# Patient Record
Sex: Female | Born: 1985 | Race: Black or African American | Hispanic: No | Marital: Single | Smoking: Former smoker
Health system: Southern US, Community
[De-identification: ages and names within clinical notes are randomized; demographics above are authoritative.]

## PROBLEM LIST (undated history)

## (undated) DIAGNOSIS — F329 Major depressive disorder, single episode, unspecified: Secondary | ICD-10-CM

## (undated) DIAGNOSIS — M549 Dorsalgia, unspecified: Secondary | ICD-10-CM

## (undated) DIAGNOSIS — I10 Essential (primary) hypertension: Secondary | ICD-10-CM

## (undated) DIAGNOSIS — G8929 Other chronic pain: Secondary | ICD-10-CM

## (undated) DIAGNOSIS — F32A Depression, unspecified: Secondary | ICD-10-CM

## (undated) DIAGNOSIS — E119 Type 2 diabetes mellitus without complications: Secondary | ICD-10-CM

## (undated) DIAGNOSIS — O24419 Gestational diabetes mellitus in pregnancy, unspecified control: Secondary | ICD-10-CM

## (undated) DIAGNOSIS — R609 Edema, unspecified: Secondary | ICD-10-CM

## (undated) DIAGNOSIS — F419 Anxiety disorder, unspecified: Secondary | ICD-10-CM

## (undated) DIAGNOSIS — G35 Multiple sclerosis: Secondary | ICD-10-CM

## (undated) HISTORY — PX: APPENDECTOMY: SHX54

## (undated) HISTORY — DX: Gestational diabetes mellitus in pregnancy, unspecified control: O24.419

## (undated) HISTORY — DX: Multiple sclerosis: G35

## (undated) HISTORY — PX: WISDOM TOOTH EXTRACTION: SHX21

---
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
## 2007-02-21 ENCOUNTER — Emergency Department (HOSPITAL_COMMUNITY): Admission: EM | Admit: 2007-02-21 | Discharge: 2007-02-21 | Payer: Self-pay | Admitting: Emergency Medicine

## 2007-02-28 ENCOUNTER — Emergency Department (HOSPITAL_COMMUNITY): Admission: EM | Admit: 2007-02-28 | Discharge: 2007-02-28 | Payer: Self-pay | Admitting: Emergency Medicine

## 2007-07-14 ENCOUNTER — Emergency Department (HOSPITAL_COMMUNITY): Admission: EM | Admit: 2007-07-14 | Discharge: 2007-07-15 | Payer: Self-pay | Admitting: Emergency Medicine

## 2007-07-15 ENCOUNTER — Inpatient Hospital Stay (HOSPITAL_COMMUNITY): Admission: AD | Admit: 2007-07-15 | Discharge: 2007-07-15 | Payer: Self-pay | Admitting: Obstetrics & Gynecology

## 2007-07-15 ENCOUNTER — Emergency Department (HOSPITAL_COMMUNITY): Admission: EM | Admit: 2007-07-15 | Discharge: 2007-07-15 | Payer: Self-pay | Admitting: Emergency Medicine

## 2007-07-16 ENCOUNTER — Inpatient Hospital Stay (HOSPITAL_COMMUNITY): Admission: AD | Admit: 2007-07-16 | Discharge: 2007-07-16 | Payer: Self-pay | Admitting: Obstetrics & Gynecology

## 2007-10-05 ENCOUNTER — Emergency Department (HOSPITAL_COMMUNITY): Admission: EM | Admit: 2007-10-05 | Discharge: 2007-10-05 | Payer: Self-pay | Admitting: Emergency Medicine

## 2008-02-11 ENCOUNTER — Emergency Department (HOSPITAL_COMMUNITY): Admission: EM | Admit: 2008-02-11 | Discharge: 2008-02-11 | Payer: Self-pay | Admitting: Emergency Medicine

## 2008-06-26 ENCOUNTER — Emergency Department (HOSPITAL_COMMUNITY): Admission: EM | Admit: 2008-06-26 | Discharge: 2008-06-26 | Payer: Self-pay | Admitting: Emergency Medicine

## 2008-08-20 ENCOUNTER — Emergency Department (HOSPITAL_COMMUNITY): Admission: EM | Admit: 2008-08-20 | Discharge: 2008-08-20 | Payer: Self-pay | Admitting: Emergency Medicine

## 2008-08-30 ENCOUNTER — Emergency Department (HOSPITAL_COMMUNITY): Admission: EM | Admit: 2008-08-30 | Discharge: 2008-08-30 | Payer: Self-pay | Admitting: Emergency Medicine

## 2008-10-08 ENCOUNTER — Emergency Department (HOSPITAL_COMMUNITY): Admission: EM | Admit: 2008-10-08 | Discharge: 2008-10-08 | Payer: Self-pay | Admitting: Emergency Medicine

## 2008-10-11 ENCOUNTER — Emergency Department (HOSPITAL_COMMUNITY): Admission: EM | Admit: 2008-10-11 | Discharge: 2008-10-12 | Payer: Self-pay | Admitting: Emergency Medicine

## 2008-12-14 ENCOUNTER — Emergency Department (HOSPITAL_COMMUNITY): Admission: EM | Admit: 2008-12-14 | Discharge: 2008-12-14 | Payer: Self-pay | Admitting: Emergency Medicine

## 2009-03-04 ENCOUNTER — Emergency Department (HOSPITAL_COMMUNITY): Admission: EM | Admit: 2009-03-04 | Discharge: 2009-03-04 | Payer: Self-pay | Admitting: Emergency Medicine

## 2009-04-29 ENCOUNTER — Emergency Department (HOSPITAL_COMMUNITY): Admission: EM | Admit: 2009-04-29 | Discharge: 2009-04-29 | Payer: Self-pay | Admitting: Emergency Medicine

## 2009-08-15 ENCOUNTER — Emergency Department (HOSPITAL_COMMUNITY): Admission: EM | Admit: 2009-08-15 | Discharge: 2009-08-16 | Payer: Self-pay | Admitting: Emergency Medicine

## 2010-04-13 ENCOUNTER — Emergency Department (HOSPITAL_COMMUNITY)
Admission: EM | Admit: 2010-04-13 | Discharge: 2010-04-13 | Disposition: A | Discharge status: Home / Self Care | Payer: Self-pay | Attending: Emergency Medicine | Admitting: Emergency Medicine

## 2010-04-13 DIAGNOSIS — R109 Unspecified abdominal pain: Secondary | ICD-10-CM | POA: Insufficient documentation

## 2010-04-13 LAB — PREGNANCY, URINE: Preg Test, Ur: NEGATIVE

## 2010-04-13 LAB — URINALYSIS, ROUTINE W REFLEX MICROSCOPIC
Hgb urine dipstick: NEGATIVE
Ketones, ur: NEGATIVE mg/dL
Nitrite: NEGATIVE
Protein, ur: NEGATIVE mg/dL
Specific Gravity, Urine: 1.02 (ref 1.005–1.030)
Urine Glucose, Fasting: NEGATIVE mg/dL
pH: 7.5 (ref 5.0–8.0)

## 2010-04-13 LAB — CBC
Platelets: 269 10*3/uL (ref 150–400)
RBC: 4.08 MIL/uL (ref 3.87–5.11)
RDW: 14.3 % (ref 11.5–15.5)
WBC: 7.7 10*3/uL (ref 4.0–10.5)

## 2010-04-13 LAB — DIFFERENTIAL
Basophils Absolute: 0 10*3/uL (ref 0.0–0.1)
Eosinophils Relative: 2 % (ref 0–5)
Lymphs Abs: 2.7 10*3/uL (ref 0.7–4.0)
Neutro Abs: 4.6 10*3/uL (ref 1.7–7.7)

## 2010-05-09 LAB — URINALYSIS, ROUTINE W REFLEX MICROSCOPIC
Bilirubin Urine: NEGATIVE
Glucose, UA: NEGATIVE mg/dL
Hgb urine dipstick: NEGATIVE
Ketones, ur: NEGATIVE mg/dL
Nitrite: NEGATIVE
Protein, ur: NEGATIVE mg/dL
Specific Gravity, Urine: 1.025 (ref 1.005–1.030)
Urobilinogen, UA: 0.2 mg/dL (ref 0.0–1.0)
pH: 7 (ref 5.0–8.0)

## 2010-05-09 LAB — WET PREP, GENITAL: WBC, Wet Prep HPF POC: NONE SEEN

## 2010-05-09 LAB — URINE MICROSCOPIC-ADD ON

## 2010-05-09 LAB — HCG, QUANTITATIVE, PREGNANCY: hCG, Beta Chain, Quant, S: <2 m[IU]/mL (ref <5)

## 2010-05-09 LAB — URINE CULTURE: Colony Count: 25000

## 2010-05-09 LAB — GC/CHLAMYDIA PROBE AMP, GENITAL
Chlamydia, DNA Probe: NEGATIVE
GC Probe Amp, Genital: NEGATIVE

## 2010-05-16 LAB — URINALYSIS, ROUTINE W REFLEX MICROSCOPIC
Bilirubin Urine: NEGATIVE
Hgb urine dipstick: NEGATIVE
Nitrite: NEGATIVE
Protein, ur: NEGATIVE mg/dL
Specific Gravity, Urine: 1.015 (ref 1.005–1.030)
Urobilinogen, UA: 0.2 mg/dL (ref 0.0–1.0)

## 2010-05-16 LAB — GC/CHLAMYDIA PROBE AMP, GENITAL: Chlamydia, DNA Probe: NEGATIVE

## 2010-05-16 LAB — WET PREP, GENITAL

## 2010-05-16 LAB — PREGNANCY, URINE: Preg Test, Ur: NEGATIVE

## 2010-05-21 ENCOUNTER — Emergency Department (HOSPITAL_COMMUNITY)
Admission: EM | Admit: 2010-05-21 | Discharge: 2010-05-21 | Disposition: A | Discharge status: Home / Self Care | Payer: Self-pay | Attending: Emergency Medicine | Admitting: Emergency Medicine

## 2010-05-21 DIAGNOSIS — W57XXXA Bitten or stung by nonvenomous insect and other nonvenomous arthropods, initial encounter: Secondary | ICD-10-CM | POA: Insufficient documentation

## 2010-05-21 DIAGNOSIS — F411 Generalized anxiety disorder: Secondary | ICD-10-CM | POA: Insufficient documentation

## 2010-05-21 DIAGNOSIS — S30860A Insect bite (nonvenomous) of lower back and pelvis, initial encounter: Secondary | ICD-10-CM | POA: Insufficient documentation

## 2010-05-27 LAB — URINALYSIS, ROUTINE W REFLEX MICROSCOPIC
Hgb urine dipstick: NEGATIVE
Nitrite: POSITIVE — AB
Protein, ur: NEGATIVE mg/dL
Specific Gravity, Urine: 1.02 (ref 1.005–1.030)
Urobilinogen, UA: 1 mg/dL (ref 0.0–1.0)

## 2010-05-27 LAB — URINE MICROSCOPIC-ADD ON

## 2010-05-27 LAB — URINE CULTURE: Colony Count: >=100000

## 2010-05-27 LAB — PREGNANCY, URINE: Preg Test, Ur: NEGATIVE

## 2010-05-29 LAB — CBC
MCV: 83.2 fL (ref 78.0–100.0)
Platelets: 233 10*3/uL (ref 150–400)
RBC: 3.96 MIL/uL (ref 3.87–5.11)
WBC: 5.9 10*3/uL (ref 4.0–10.5)

## 2010-05-29 LAB — URINALYSIS, ROUTINE W REFLEX MICROSCOPIC
Bilirubin Urine: NEGATIVE
Hgb urine dipstick: NEGATIVE
Ketones, ur: NEGATIVE mg/dL
Specific Gravity, Urine: 1.025 (ref 1.005–1.030)
pH: 6.5 (ref 5.0–8.0)

## 2010-05-29 LAB — DIFFERENTIAL
Basophils Absolute: 0 10*3/uL (ref 0.0–0.1)
Eosinophils Absolute: 0.1 10*3/uL (ref 0.0–0.7)
Eosinophils Relative: 2 % (ref 0–5)
Lymphocytes Relative: 37 % (ref 12–46)
Lymphs Abs: 2.2 10*3/uL (ref 0.7–4.0)
Monocytes Absolute: 0.3 10*3/uL (ref 0.1–1.0)

## 2010-05-29 LAB — GC/CHLAMYDIA PROBE AMP, GENITAL: GC Probe Amp, Genital: NEGATIVE

## 2010-05-29 LAB — COMPREHENSIVE METABOLIC PANEL
ALT: 15 U/L (ref 0–35)
AST: 20 U/L (ref 0–37)
Albumin: 3 g/dL — ABNORMAL LOW (ref 3.5–5.2)
CO2: 29 mEq/L (ref 19–32)
Chloride: 103 mEq/L (ref 96–112)
Creatinine, Ser: 0.69 mg/dL (ref 0.4–1.2)
GFR calc Af Amer: >60 mL/min (ref >60)
Sodium: 135 mEq/L (ref 135–145)
Total Bilirubin: 0.3 mg/dL (ref 0.3–1.2)

## 2010-05-29 LAB — URINE CULTURE: Colony Count: 9000

## 2010-05-31 LAB — URINALYSIS, ROUTINE W REFLEX MICROSCOPIC
Ketones, ur: NEGATIVE mg/dL
Nitrite: NEGATIVE
Specific Gravity, Urine: 1.02 (ref 1.005–1.030)
pH: 6.5 (ref 5.0–8.0)

## 2010-05-31 LAB — PREGNANCY, URINE: Preg Test, Ur: NEGATIVE

## 2010-11-10 LAB — PREGNANCY, URINE: Preg Test, Ur: NEGATIVE

## 2010-11-17 LAB — HCG, QUANTITATIVE, PREGNANCY: hCG, Beta Chain, Quant, S: 997 — ABNORMAL HIGH

## 2010-11-17 LAB — BASIC METABOLIC PANEL
BUN: 8
CO2: 27
Calcium: 9
Chloride: 102
Glucose, Bld: 123 — ABNORMAL HIGH
Potassium: 4.1
Sodium: 138

## 2010-11-17 LAB — CBC
HCT: 31.8 — ABNORMAL LOW
Hemoglobin: 11 — ABNORMAL LOW
MCHC: 34.6
MCHC: 34.8
MCV: 83.6
RBC: 3.87
RDW: 14.9
WBC: 5.2

## 2010-11-17 LAB — WET PREP, GENITAL: Yeast Wet Prep HPF POC: NONE SEEN

## 2010-11-17 LAB — DIFFERENTIAL
Basophils Absolute: 0
Basophils Relative: 0
Basophils Relative: 0
Eosinophils Absolute: 0.1
Eosinophils Relative: 2
Lymphs Abs: 1.6
Monocytes Absolute: 0.3
Monocytes Relative: 8
Neutro Abs: 3
Neutrophils Relative %: 57

## 2010-11-17 LAB — URINALYSIS, ROUTINE W REFLEX MICROSCOPIC
Glucose, UA: NEGATIVE
Ketones, ur: NEGATIVE
Leukocytes, UA: NEGATIVE
Protein, ur: NEGATIVE
Urobilinogen, UA: 1

## 2010-11-17 LAB — URINE MICROSCOPIC-ADD ON

## 2010-11-17 LAB — PREGNANCY, URINE: Preg Test, Ur: POSITIVE

## 2010-11-26 LAB — URINALYSIS, ROUTINE W REFLEX MICROSCOPIC
Glucose, UA: NEGATIVE mg/dL
Hgb urine dipstick: NEGATIVE
Ketones, ur: NEGATIVE mg/dL
Protein, ur: NEGATIVE mg/dL

## 2010-11-26 LAB — WET PREP, GENITAL
Clue Cells Wet Prep HPF POC: NONE SEEN
Trich, Wet Prep: NONE SEEN
Yeast Wet Prep HPF POC: NONE SEEN

## 2010-12-03 LAB — URINALYSIS, ROUTINE W REFLEX MICROSCOPIC
Bilirubin Urine: NEGATIVE
Glucose, UA: NEGATIVE
Ketones, ur: NEGATIVE
Nitrite: NEGATIVE
Protein, ur: NEGATIVE

## 2010-12-03 LAB — PREGNANCY, URINE: Preg Test, Ur: NEGATIVE

## 2010-12-17 ENCOUNTER — Emergency Department (HOSPITAL_COMMUNITY)
Admission: EM | Admit: 2010-12-17 | Discharge: 2010-12-17 | Disposition: A | Discharge status: Home / Self Care | Payer: BC Managed Care – PPO | Attending: Emergency Medicine | Admitting: Emergency Medicine

## 2010-12-17 ENCOUNTER — Emergency Department (HOSPITAL_COMMUNITY): Payer: BC Managed Care – PPO

## 2010-12-17 DIAGNOSIS — J189 Pneumonia, unspecified organism: Secondary | ICD-10-CM | POA: Insufficient documentation

## 2010-12-17 MED ORDER — DOXYCYCLINE HYCLATE 100 MG PO CAPS: 100.0000 mg | ORAL_CAPSULE | Freq: Two times a day (BID) | ORAL | Status: AC

## 2010-12-17 NOTE — ED Notes (Signed)
Pt presents with cough and congestion x 2 days. Pt states she is coughing up phlegm that "tastes like a toxin". NAD at this time. Pt ambulatory to triage with with steady gate.

## 2010-12-17 NOTE — ED Provider Notes (Signed)
History   This chart was scribed for Brooke Gaskins, MD by Clarita Crane. The patient was seen in room [facility] and the patient's care was started at 4:48PM.   CSN: [id number] Arrival date & time: 12/17/2010  4:25 PM   First MD Initiated Contact with Patient 12/17/10 1638      Chief Complaint  Patient presents with  . Cough  . Nasal Congestion    (Consider location/radiation/quality/duration/timing/severity/associated sxs/prior treatment) HPI Brooke [name] is a 25 y.o. female who presents to the Emergency Department complaining of constant moderate productive cough with yellow sputum and nasal congestion onset 2 days ago and persistent since with associated sore throat and chest soreness with coughing. Denies fever. Patient denies h/o cardiac disease.   History reviewed. No pertinent past medical history.  History reviewed. No pertinent past surgical history.  History reviewed. No pertinent family history.  History  Substance Use Topics  . Smoking status: Never Smoker   . Smokeless tobacco: Not on file  . Alcohol Use: No    OB History    Grav Para Term Preterm Abortions TAB SAB Ect Mult Living                  Review of Systems 10 Systems reviewed and are negative for acute change except as noted in the HPI.  Allergies  Review of patient's allergies indicates no known allergies.  Home Medications   Current Outpatient Rx  Name Route Sig Dispense Refill  . ALPRAZOLAM 0.5 MG PO TABS Oral Take 0.5 mg by mouth 2 (two) times daily.      [name] FERROUS FUMARATE 325 (106 FE) MG PO TABS Oral Take 1 tablet by mouth daily.      . FUROSEMIDE 20 MG PO TABS Oral Take 20 mg by mouth 2 (two) times daily.      [name] DOXYCYCLINE HYCLATE 100 MG PO CAPS Oral Take 1 capsule (100 mg total) by mouth 2 (two) times daily. 20 capsule 0    BP 131/75  Pulse 69  Temp(Src) 98.4 F (36.9 C) (Oral)  Resp 20  Ht 5\' 4"  (1.626 m)  Wt 360 lb (163.295 kg)  BMI 61.79 kg/m2  SpO2 100%  LMP  11/30/2010  Physical Exam CONSTITUTIONAL: Well developed/well nourished HEAD AND FACE: Normocephalic/atraumatic EYES: EOMI/PERRL ENMT: Mucous membranes moist, oropharynx clear and moist NECK: supple no meningeal signs CV: S1/S2 noted, no murmurs/rubs/gallops noted LUNGS: Lungs are clear to auscultation bilaterally, no apparent distress ABDOMEN: soft, nontender, non-distended NEURO: Pt is awake/alert, moves all extremitiesx4 EXTREMITIES: pulses normal, full ROM SKIN: warm, color normal PSYCH: no abnormalities of mood noted  ED Course  Procedures (including critical care time)  DIAGNOSTIC STUDIES: Oxygen Saturation is 100% on room air, normal by my interpretation.    COORDINATION OF CARE:    Labs Reviewed - No data to display Dg Chest 2 View  12/17/2010  *RADIOLOGY REPORT*  Clinical Data: Cough and chest tightness.  CHEST - 2 VIEW  Comparison: Chest 02/28/2007.  Findings: The patient has right upper lobe airspace disease consistent with pneumonia.  Lungs otherwise appear clear.  Heart size is normal.  No pneumothorax or pleural effusion.  IMPRESSION: Right upper lobe airspace disease consistent with pneumonia.  Original Report Authenticated By: Bernadene Bell. D'ALESSIO, M.D.     1. Pneumonia       MDM  Nursing notes reviewed and considered in documentation xrays reviewed and considered  Pt well appearing, no distress, ambulatory, vitals appropriate, can be managed as  outpatient for pneumonia      I personally performed the services described in this documentation, which was scribed in my presence. The recorded information has been reviewed and considered.     Brooke Gaskins, MD 12/17/10 [phone]

## 2011-02-16 ENCOUNTER — Encounter (HOSPITAL_COMMUNITY): Payer: Self-pay | Admitting: Emergency Medicine

## 2011-02-16 ENCOUNTER — Emergency Department (HOSPITAL_COMMUNITY)
Admission: EM | Admit: 2011-02-16 | Discharge: 2011-02-16 | Disposition: A | Discharge status: Home / Self Care | Payer: BC Managed Care – PPO | Attending: Emergency Medicine | Admitting: Emergency Medicine

## 2011-02-16 DIAGNOSIS — R1110 Vomiting, unspecified: Secondary | ICD-10-CM | POA: Insufficient documentation

## 2011-02-16 DIAGNOSIS — J111 Influenza due to unidentified influenza virus with other respiratory manifestations: Secondary | ICD-10-CM | POA: Insufficient documentation

## 2011-02-16 DIAGNOSIS — IMO0001 Reserved for inherently not codable concepts without codable children: Secondary | ICD-10-CM | POA: Insufficient documentation

## 2011-02-16 DIAGNOSIS — F411 Generalized anxiety disorder: Secondary | ICD-10-CM | POA: Insufficient documentation

## 2011-02-16 DIAGNOSIS — Z79899 Other long term (current) drug therapy: Secondary | ICD-10-CM | POA: Insufficient documentation

## 2011-02-16 DIAGNOSIS — R10819 Abdominal tenderness, unspecified site: Secondary | ICD-10-CM | POA: Insufficient documentation

## 2011-02-16 HISTORY — DX: Anxiety disorder, unspecified: F41.9

## 2011-02-16 HISTORY — DX: Edema, unspecified: R60.9

## 2011-02-16 LAB — CBC
HCT: 37.6 % (ref 36.0–46.0)
MCH: 27.9 pg (ref 26.0–34.0)
MCHC: 33.8 g/dL (ref 30.0–36.0)
RDW: 14.9 % (ref 11.5–15.5)

## 2011-02-16 LAB — BASIC METABOLIC PANEL
BUN: 7 mg/dL (ref 6–23)
Calcium: 9.5 mg/dL (ref 8.4–10.5)
Chloride: 100 mEq/L (ref 96–112)
Creatinine, Ser: 0.85 mg/dL (ref 0.50–1.10)
GFR calc Af Amer: >90 mL/min (ref >90)
GFR calc non Af Amer: >90 mL/min (ref >90)

## 2011-02-16 LAB — DIFFERENTIAL
Basophils Absolute: 0 10*3/uL (ref 0.0–0.1)
Basophils Relative: 0 % (ref 0–1)
Eosinophils Absolute: 0.2 10*3/uL (ref 0.0–0.7)
Monocytes Absolute: 0.4 10*3/uL (ref 0.1–1.0)
Monocytes Relative: 6 % (ref 3–12)
Neutro Abs: 3.6 10*3/uL (ref 1.7–7.7)

## 2011-02-16 MED ORDER — IBUPROFEN 800 MG PO TABS: 800.0000 mg | ORAL_TABLET | Freq: Three times a day (TID) | ORAL | Status: AC | PRN

## 2011-02-16 MED ORDER — KETOROLAC TROMETHAMINE 30 MG/ML IJ SOLN
30.0000 mg | Freq: Once | INTRAMUSCULAR | Status: AC
  Administered 2011-02-16: 30 mg via INTRAVENOUS
  Filled 2011-02-16: qty 1

## 2011-02-16 MED ORDER — SODIUM CHLORIDE 0.9 % IV BOLUS (SEPSIS)
1000.0000 mL | Freq: Once | INTRAVENOUS | Status: AC
  Administered 2011-02-16: 1000 mL via INTRAVENOUS

## 2011-02-16 MED ORDER — OSELTAMIVIR PHOSPHATE 75 MG PO CAPS: 75.0000 mg | ORAL_CAPSULE | Freq: Two times a day (BID) | ORAL | Status: AC

## 2011-02-16 MED ORDER — ONDANSETRON HCL 4 MG/2ML IJ SOLN
4.0000 mg | Freq: Once | INTRAMUSCULAR | Status: AC
  Administered 2011-02-16: 4 mg via INTRAVENOUS
  Filled 2011-02-16: qty 2

## 2011-02-16 MED ORDER — PROMETHAZINE HCL 25 MG PO TABS: 25.0000 mg | ORAL_TABLET | Freq: Four times a day (QID) | ORAL | Status: AC | PRN

## 2011-02-16 NOTE — ED Notes (Signed)
Patient is alert and oriented x 4 with respirations even and unlabored.  NAD at this time.  Discharge instructions reviewed with patient and patient verbalized understanding.  Pt ambulated to lobby with steady gait, and family member to transport pt home.  

## 2011-02-16 NOTE — ED Provider Notes (Signed)
History    This chart was scribed for Benny Lennert, MD, MD by Smitty Pluck. The patient was seen in room APA04 and the patient's care was started at 5:02PM.   CSN: [id number]  Arrival date & time 02/16/11  1351   First MD Initiated Contact with Patient 02/16/11 1700      Chief Complaint  Patient presents with  . Generalized Body Aches  . Emesis    (Consider location/radiation/quality/duration/timing/severity/associated sxs/prior treatment) Patient is a 25 y.o. female presenting with vomiting. The history is provided by the patient.  Emesis    Brooke [name] is a 25 y.o. female who presents to the Emergency Department complaining of moderate generalized body aches and persistent emesis (7-8x) onset 1 day ago with symptoms worsening today. There is no radiation. Pt also has productive cough and diarrhea (6-7x). Pt reports no recent sick contact. Pt denies urinary problems.   Past Medical History  Diagnosis Date  . Anxiety   . Edema     History reviewed. No pertinent past surgical history.  Family History  Problem Relation Age of Onset  . Diabetes Mother     History  Substance Use Topics  . Smoking status: Former Smoker    Quit date: 09/16/2007  . Smokeless tobacco: Never Used  . Alcohol Use: No    OB History    Grav Para Term Preterm Abortions TAB SAB Ect Mult Living   1    1   1   0      Review of Systems  Gastrointestinal: Positive for vomiting.  All other systems reviewed and are negative.  10 Systems reviewed and are negative for acute change except as noted in the HPI.   Allergies  Latex  Home Medications   Current Outpatient Rx  Name Route Sig Dispense Refill  . ALPRAZOLAM 0.5 MG PO TABS Oral Take 0.5 mg by mouth 2 (two) times daily.      [name] FERROUS FUMARATE 325 (106 FE) MG PO TABS Oral Take 1 tablet by mouth daily.      . FUROSEMIDE 20 MG PO TABS Oral Take 20 mg by mouth 2 (two) times daily.      . IBUPROFEN 800 MG PO TABS Oral Take 1  tablet (800 mg total) by mouth every 8 (eight) hours as needed for pain. 21 tablet 0  . OSELTAMIVIR PHOSPHATE 75 MG PO CAPS Oral Take 1 capsule (75 mg total) by mouth 2 (two) times daily. 10 capsule 0  . PROMETHAZINE HCL 25 MG PO TABS Oral Take 1 tablet (25 mg total) by mouth every 6 (six) hours as needed for nausea. 15 tablet 0    BP 120/65  Pulse 82  Temp(Src) 98.5 F (36.9 C) (Oral)  Resp 18  Ht 5\' 4"  (1.626 m)  Wt 368 lb 12.8 oz (167.287 kg)  BMI 63.30 kg/m2  SpO2 99%  LMP 02/01/2011  Physical Exam  Nursing note and vitals reviewed. Constitutional: She is oriented to person, place, and time. She appears well-developed.       obese  HENT:  Head: Normocephalic and atraumatic.  Eyes: Conjunctivae and EOM are normal. No scleral icterus.  Neck: Neck supple. No thyromegaly present.  Cardiovascular: Normal rate and regular rhythm.  Exam reveals no gallop and no friction rub.   No murmur heard. Pulmonary/Chest: No stridor. She has no wheezes. She has no rales. She exhibits no tenderness.  Abdominal: She exhibits no distension. There is Tenderness: mild throughhout abd.. There is no  rebound.  Musculoskeletal: Normal range of motion. She exhibits no edema.  Lymphadenopathy:    She has no cervical adenopathy.  Neurological: She is alert and oriented to person, place, and time. Coordination normal.  Skin: Skin is warm and dry. No rash noted. No erythema.  Psychiatric: She has a normal mood and affect. Her behavior is normal.    ED Course  Procedures (including critical care time)  DIAGNOSTIC STUDIES: Oxygen Saturation is 100% on room air, normal by my interpretation.    COORDINATION OF CARE:  6:40PM :Recheck: Patient is feeling better. EDP discuss lab results and treatment course. Pt is ready for discharge.  Labs Reviewed  BASIC METABOLIC PANEL - Abnormal; Notable for the following:    Glucose, Bld 132 (*)    All other components within normal limits  CBC  DIFFERENTIAL    No results found.   1. Influenza       MDM       The chart was scribed for me under my direct supervision.  I personally performed the history, physical, and medical decision making and all procedures in the evaluation of this patient.Benny Lennert, MD 02/16/11 [phone]

## 2011-02-16 NOTE — ED Notes (Signed)
Patient c/o vomiting with generalized body aches (bilaterally back and flank pain.) Patient denies any urinary problems but reports diarrhea. No known fever per patient.

## 2011-08-06 ENCOUNTER — Emergency Department (HOSPITAL_COMMUNITY)
Admission: EM | Admit: 2011-08-06 | Discharge: 2011-08-06 | Disposition: A | Discharge status: Home / Self Care | Payer: BC Managed Care – PPO | Attending: Emergency Medicine | Admitting: Emergency Medicine

## 2011-08-06 ENCOUNTER — Encounter (HOSPITAL_COMMUNITY): Payer: Self-pay | Admitting: *Deleted

## 2011-08-06 DIAGNOSIS — Z79899 Other long term (current) drug therapy: Secondary | ICD-10-CM | POA: Insufficient documentation

## 2011-08-06 DIAGNOSIS — F411 Generalized anxiety disorder: Secondary | ICD-10-CM | POA: Insufficient documentation

## 2011-08-06 DIAGNOSIS — M79609 Pain in unspecified limb: Secondary | ICD-10-CM | POA: Insufficient documentation

## 2011-08-06 DIAGNOSIS — W268XXA Contact with other sharp object(s), not elsewhere classified, initial encounter: Secondary | ICD-10-CM | POA: Insufficient documentation

## 2011-08-06 DIAGNOSIS — S61409A Unspecified open wound of unspecified hand, initial encounter: Secondary | ICD-10-CM | POA: Insufficient documentation

## 2011-08-06 DIAGNOSIS — S61412A Laceration without foreign body of left hand, initial encounter: Secondary | ICD-10-CM

## 2011-08-06 NOTE — ED Notes (Signed)
Pt states a piece of glass from a window pane fell on to hand earlier today; pt states area has been bleeding; at this time wound dry and starting to scab over; Pt states she is up to date on vaccinations; last tenus vaccine 2011

## 2011-08-06 NOTE — ED Notes (Signed)
Laceration to palm of left hand

## 2011-08-06 NOTE — ED Provider Notes (Signed)
History     CSN: [id number]  Arrival date & time 08/06/11  2214   First MD Initiated Contact with Patient 08/06/11 2225      Chief Complaint  Patient presents with  . Extremity Laceration    (Consider location/radiation/quality/duration/timing/severity/associated sxs/prior treatment) HPI Comments: Pt broke a drinking glass and it cut her L hand this AM.  dT UTD.  The history is provided by the patient. No language interpreter was used.    Past Medical History  Diagnosis Date  . Anxiety   . Edema     History reviewed. No pertinent past surgical history.  Family History  Problem Relation Age of Onset  . Diabetes Mother     History  Substance Use Topics  . Smoking status: Former Smoker    Quit date: 09/16/2007  . Smokeless tobacco: Never Used  . Alcohol Use: No    OB History    Grav Para Term Preterm Abortions TAB SAB Ect Mult Living   1    1   1   0      Review of Systems  Skin: Positive for wound.  All other systems reviewed and are negative.    Allergies  Latex  Home Medications   Current Outpatient Rx  Name Route Sig Dispense Refill  . ALPRAZOLAM 0.5 MG PO TABS Oral Take 0.5 mg by mouth 2 (two) times daily.      [name] FERROUS FUMARATE 325 (106 FE) MG PO TABS Oral Take 1 tablet by mouth daily.      . FUROSEMIDE 20 MG PO TABS Oral Take 20 mg by mouth 2 (two) times daily.        BP 124/61  Pulse 99  Temp 98.6 F (37 C) (Oral)  Resp 20  Ht 5\' 4"  (1.626 m)  Wt 350 lb (158.759 kg)  BMI 60.08 kg/m2  SpO2 98%  LMP 06/06/2011  Physical Exam  Nursing note and vitals reviewed. Constitutional: She is oriented to person, place, and time. She appears well-developed and well-nourished. No distress.  HENT:  Head: Normocephalic and atraumatic.  Eyes: EOM are normal.  Neck: Normal range of motion.  Cardiovascular: Normal rate, regular rhythm and normal heart sounds.   Pulmonary/Chest: Effort normal and breath sounds normal.  Abdominal: Soft. She exhibits  no distension. There is no tenderness.  Musculoskeletal: Normal range of motion.       Left hand: She exhibits tenderness and laceration. She exhibits normal range of motion, no bony tenderness, normal capillary refill and no deformity. normal sensation noted. Normal strength noted.       Hands: Neurological: She is alert and oriented to person, place, and time.  Skin: Skin is warm and dry.  Psychiatric: She has a normal mood and affect. Judgment normal.    ED Course  Procedures (including critical care time)  Labs Reviewed - No data to display No results found.   1. Laceration of left hand       MDM  Wash bid/abx ointment.  Return prn.        Worthy Rancher, PA 08/06/11 2322

## 2011-08-06 NOTE — ED Provider Notes (Signed)
Medical screening examination/treatment/procedure(s) were performed by non-physician practitioner and as supervising physician I was immediately available for consultation/collaboration.   Chaunce Winkels B. Bernette Mayers, MD 08/06/11 2324

## 2011-08-06 NOTE — Discharge Instructions (Signed)

## 2011-08-06 NOTE — ED Notes (Signed)
Pt instructed to wash area BID and to keep area clean and dry

## 2011-08-14 ENCOUNTER — Emergency Department (HOSPITAL_COMMUNITY)
Admission: EM | Admit: 2011-08-14 | Discharge: 2011-08-14 | Disposition: A | Discharge status: Home / Self Care | Payer: BC Managed Care – PPO | Attending: Emergency Medicine | Admitting: Emergency Medicine

## 2011-08-14 ENCOUNTER — Encounter (HOSPITAL_COMMUNITY): Payer: Self-pay | Admitting: Emergency Medicine

## 2011-08-14 DIAGNOSIS — J029 Acute pharyngitis, unspecified: Secondary | ICD-10-CM

## 2011-08-14 DIAGNOSIS — R509 Fever, unspecified: Secondary | ICD-10-CM | POA: Insufficient documentation

## 2011-08-14 MED ORDER — PENICILLIN G BENZATHINE 1200000 UNIT/2ML IM SUSP
1.2000 10*6.[IU] | Freq: Once | INTRAMUSCULAR | Status: AC
  Administered 2011-08-14: 1.2 10*6.[IU] via INTRAMUSCULAR
  Filled 2011-08-14: qty 2

## 2011-08-14 MED ORDER — IBUPROFEN 800 MG PO TABS
800.0000 mg | ORAL_TABLET | Freq: Once | ORAL | Status: AC
  Administered 2011-08-14: 800 mg via ORAL
  Filled 2011-08-14: qty 1

## 2011-08-14 NOTE — ED Provider Notes (Signed)
History     CSN: [id number]  Arrival date & time 08/14/11  1112   First MD Initiated Contact with Patient 08/14/11 1141      Chief Complaint  Patient presents with  . Sore Throat  . Chills    (Consider location/radiation/quality/duration/timing/severity/associated sxs/prior treatment) HPI Comments: Sore throat since yest.  + subj fever.  + chills.  Generalized body aches and headache.  No rash.  No known strep exposure.  Not taking any meds to tx sxs.  Patient is a 26 y.o. female presenting with pharyngitis. The history is provided by the patient. No language interpreter was used.  Sore Throat This is a new problem. Associated symptoms include chills, a fever, headaches, myalgias, a sore throat and swollen glands. Pertinent negatives include no rash. Nothing aggravates the symptoms. She has tried nothing for the symptoms.    Past Medical History  Diagnosis Date  . Anxiety   . Edema     History reviewed. No pertinent past surgical history.  Family History  Problem Relation Age of Onset  . Diabetes Mother     History  Substance Use Topics  . Smoking status: Former Smoker    Quit date: 09/16/2007  . Smokeless tobacco: Never Used  . Alcohol Use: No    OB History    Grav Para Term Preterm Abortions TAB SAB Ect Mult Living   1    1   1   0      Review of Systems  Constitutional: Positive for fever and chills.  HENT: Positive for sore throat.   Musculoskeletal: Positive for myalgias.  Skin: Negative for rash.  Neurological: Positive for headaches.  All other systems reviewed and are negative.    Allergies  Latex  Home Medications   Current Outpatient Rx  Name Route Sig Dispense Refill  . FERROUS FUMARATE 325 (106 FE) MG PO TABS Oral Take 1 tablet by mouth daily.      . FUROSEMIDE 20 MG PO TABS Oral Take 20 mg by mouth 2 (two) times daily.        BP 118/65  Pulse 107  Temp 99.8 F (37.7 C)  Resp 18  Ht 5\' 4"  (1.626 m)  Wt 350 lb (158.759 kg)  BMI  60.08 kg/m2  SpO2 96%  LMP 07/16/2011  Physical Exam  Nursing note and vitals reviewed. Constitutional: She is oriented to person, place, and time. She appears well-developed and well-nourished. No distress.  HENT:  Head: Normocephalic and atraumatic.  Mouth/Throat: Uvula is midline and mucous membranes are normal. No uvula swelling. Posterior oropharyngeal erythema present. No oropharyngeal exudate, posterior oropharyngeal edema or tonsillar abscesses.       Bilateral tonsillar enlargement.  Eyes: EOM are normal.  Neck: Normal range of motion.  Cardiovascular: Normal rate, regular rhythm and normal heart sounds.   Pulmonary/Chest: Effort normal and breath sounds normal.  Abdominal: Soft. She exhibits no distension. There is no tenderness.  Musculoskeletal: Normal range of motion.  Lymphadenopathy:    She has cervical adenopathy.       Right cervical: Superficial cervical and deep cervical adenopathy present.       Left cervical: Superficial cervical and deep cervical adenopathy present.  Neurological: She is alert and oriented to person, place, and time.  Skin: Skin is warm and dry.  Psychiatric: She has a normal mood and affect. Judgment normal.    ED Course  Procedures (including critical care time)  Labs Reviewed - No data to display No results found.  No diagnosis found.    MDM  Bicillin LA 1.2 million units.  Tylenol or ibuprofen Salt water gargles and chloraseptic  F/u with PCP       Worthy Rancher, PA 08/14/11 1208

## 2011-08-14 NOTE — ED Notes (Signed)
Pt c/o sore throat and chills since last night.

## 2011-08-14 NOTE — Discharge Instructions (Signed)
Pharyngitis, Viral and Bacterial Pharyngitis is soreness (inflammation) or infection of the pharynx. It is also called a sore throat. CAUSES  Most sore throats are caused by viruses and are part of a cold. However, some sore throats are caused by strep and other bacteria. Sore throats can also be caused by post nasal drip from draining sinuses, allergies and sometimes from sleeping with an open mouth. Infectious sore throats can be spread from person to person by coughing, sneezing and sharing cups or eating utensils. TREATMENT  Sore throats that are viral usually last 3-4 days. Viral illness will get better without medications (antibiotics). Strep throat and other bacterial infections will usually begin to get better about 24-48 hours after you begin to take antibiotics. HOME CARE INSTRUCTIONS   If the caregiver feels there is a bacterial infection or if there is a positive strep test, they will prescribe an antibiotic. The full course of antibiotics must be taken. If the full course of antibiotic is not taken, you or your child may become ill again. If you or your child has strep throat and do not finish all of the medication, serious heart or kidney diseases may develop.   Drink enough water and fluids to keep your urine clear or pale yellow.   Only take over-the-counter or prescription medicines for pain, discomfort or fever as directed by your caregiver.   Get lots of rest.   Gargle with salt water ( tsp. of salt in a glass of water) as often as every 1-2 hours as you need for comfort.   Hard candies may soothe the throat if individual is not at risk for choking. Throat sprays or lozenges may also be used.  SEEK MEDICAL CARE IF:   Large, tender lumps in the neck develop.   A rash develops.   Green, yellow-brown or bloody sputum is coughed up.   Your baby is older than 3 months with a rectal temperature of 100.5 F (38.1 C) or higher for more than 1 day.  SEEK IMMEDIATE MEDICAL CARE  IF:   A stiff neck develops.   You or your child are drooling or unable to swallow liquids.   You or your child are vomiting, unable to keep medications or liquids down.   You or your child has severe pain, unrelieved with recommended medications.   You or your child are having difficulty breathing (not due to stuffy nose).   You or your child are unable to fully open your mouth.   You or your child develop redness, swelling, or severe pain anywhere on the neck.   You have a fever.   Your baby is older than 3 months with a rectal temperature of 102 F (38.9 C) or higher.   Your baby is 67 months old or younger with a rectal temperature of 100.4 F (38 C) or higher.  MAKE SURE YOU:   Understand these instructions.   Will watch your condition.   Will get help right away if you are not doing well or get worse.  Document Released: 02/07/2005 Document Revised: 01/27/2011 Document Reviewed: 05/07/2007 [facility] Patient Information 2012 [place], [place].   You have been treated for strep with a long-acting injection of penicillin.  Take tylenol  Up  To 1000 mg every 4 hrs or ibuprofen up to 800 mg every 8 hrs for fever or discomfort.  Gargle frequently with salt water.  Chloraseptic will help also.  Follow up with your PCP as needed.

## 2011-08-16 NOTE — ED Provider Notes (Signed)
Medical screening examination/treatment/procedure(s) were performed by non-physician practitioner and as supervising physician I was immediately available for consultation/collaboration.   Suesan Mohrmann W. Kevina Piloto, MD 08/16/11 1025 

## 2012-04-27 ENCOUNTER — Emergency Department (HOSPITAL_COMMUNITY)
Admission: EM | Admit: 2012-04-27 | Discharge: 2012-04-27 | Disposition: A | Discharge status: Home / Self Care | Payer: BC Managed Care – PPO | Attending: Emergency Medicine | Admitting: Emergency Medicine

## 2012-04-27 ENCOUNTER — Encounter (HOSPITAL_COMMUNITY): Payer: Self-pay | Admitting: *Deleted

## 2012-04-27 DIAGNOSIS — R1013 Epigastric pain: Secondary | ICD-10-CM | POA: Insufficient documentation

## 2012-04-27 DIAGNOSIS — Z3202 Encounter for pregnancy test, result negative: Secondary | ICD-10-CM | POA: Insufficient documentation

## 2012-04-27 DIAGNOSIS — Z8659 Personal history of other mental and behavioral disorders: Secondary | ICD-10-CM | POA: Insufficient documentation

## 2012-04-27 DIAGNOSIS — K5900 Constipation, unspecified: Secondary | ICD-10-CM | POA: Insufficient documentation

## 2012-04-27 DIAGNOSIS — Z87891 Personal history of nicotine dependence: Secondary | ICD-10-CM | POA: Insufficient documentation

## 2012-04-27 LAB — CBC WITH DIFFERENTIAL/PLATELET
Eosinophils Absolute: 0 10*3/uL (ref 0.0–0.7)
Eosinophils Relative: 1 % (ref 0–5)
HCT: 36.4 % (ref 36.0–46.0)
Hemoglobin: 12.5 g/dL (ref 12.0–15.0)
Lymphs Abs: 0.8 10*3/uL (ref 0.7–4.0)
MCH: 27.7 pg (ref 26.0–34.0)
MCV: 80.5 fL (ref 78.0–100.0)
Monocytes Absolute: 0.2 10*3/uL (ref 0.1–1.0)
Monocytes Relative: 4 % (ref 3–12)
RBC: 4.52 MIL/uL (ref 3.87–5.11)

## 2012-04-27 LAB — COMPREHENSIVE METABOLIC PANEL
AST: 18 U/L (ref 0–37)
Albumin: 3.4 g/dL — ABNORMAL LOW (ref 3.5–5.2)
Calcium: 9.2 mg/dL (ref 8.4–10.5)
Chloride: 97 mEq/L (ref 96–112)
Creatinine, Ser: 0.77 mg/dL (ref 0.50–1.10)
Total Bilirubin: 0.4 mg/dL (ref 0.3–1.2)
Total Protein: 8.3 g/dL (ref 6.0–8.3)

## 2012-04-27 LAB — URINALYSIS, ROUTINE W REFLEX MICROSCOPIC
Ketones, ur: NEGATIVE mg/dL
Leukocytes, UA: NEGATIVE
Nitrite: NEGATIVE
pH: 6.5 (ref 5.0–8.0)

## 2012-04-27 MED ORDER — OXYCODONE-ACETAMINOPHEN 5-325 MG PO TABS
2.0000 | ORAL_TABLET | Freq: Once | ORAL | Status: AC
  Administered 2012-04-27: 2 via ORAL
  Filled 2012-04-27: qty 2

## 2012-04-27 MED ORDER — DOCUSATE SODIUM 100 MG PO CAPS: 100.0000 mg | ORAL_CAPSULE | Freq: Two times a day (BID) | ORAL | Status: DC

## 2012-04-27 MED ORDER — GI COCKTAIL ~~LOC~~
30.0000 mL | Freq: Once | ORAL | Status: AC
  Administered 2012-04-27: 30 mL via ORAL
  Filled 2012-04-27: qty 30

## 2012-04-27 MED ORDER — HYDROMORPHONE HCL PF 1 MG/ML IJ SOLN
1.0000 mg | Freq: Once | INTRAMUSCULAR | Status: AC
  Administered 2012-04-27: 1 mg via INTRAMUSCULAR
  Filled 2012-04-27: qty 1

## 2012-04-27 MED ORDER — ONDANSETRON 8 MG PO TBDP
8.0000 mg | ORAL_TABLET | Freq: Once | ORAL | Status: AC
  Administered 2012-04-27: 8 mg via ORAL
  Filled 2012-04-27: qty 1

## 2012-04-27 NOTE — ED Notes (Signed)
Abdominal pain onset this am, nausea , no vomiting or diarrhea, denies vaginal discharge or dysuria

## 2012-04-27 NOTE — ED Provider Notes (Signed)
History     CSN: [id number]  Arrival date & time 04/27/12  2130   First MD Initiated Contact with Patient 04/27/12 1903      Chief Complaint  Patient presents with  . Abdominal Pain     Patient is a 27 y.o. female presenting with abdominal pain. The history is provided by the patient.  Abdominal Pain Pain location:  Epigastric Pain quality: aching   Pain radiates to:  Does not radiate Pain severity:  Moderate Onset quality:  Gradual Timing:  Intermittent Progression:  Worsening Chronicity:  New Relieved by:  Nothing Worsened by:  Nothing tried Associated symptoms: no chest pain, no diarrhea, no dysuria, no fever, no shortness of breath, no vaginal bleeding, no vaginal discharge and no vomiting   pt reports she woke up this morning with upper abdominal pain It has been intermittent.  She has never had this before.   Past Medical History  Diagnosis Date  . Anxiety   . Edema     History reviewed. No pertinent past surgical history.  Family History  Problem Relation Age of Onset  . Diabetes Mother     History  Substance Use Topics  . Smoking status: Former Smoker    Quit date: 09/16/2007  . Smokeless tobacco: Never Used  . Alcohol Use: No    OB History   Grav Para Term Preterm Abortions TAB SAB Ect Mult Living   1    1   1   0      Review of Systems  Constitutional: Negative for fever.  Respiratory: Negative for shortness of breath.   Cardiovascular: Negative for chest pain.  Gastrointestinal: Positive for abdominal pain. Negative for vomiting and diarrhea.  Genitourinary: Negative for dysuria, vaginal bleeding and vaginal discharge.  All other systems reviewed and are negative.    Allergies  Latex  Home Medications   Current Outpatient Rx  Name  Route  Sig  Dispense  Refill  . ferrous sulfate 325 (65 FE) MG tablet   Oral   Take 325 mg by mouth every morning.           BP 126/76  Pulse 110  Temp(Src) 98.2 F (36.8 C)  Resp 20  Ht 5\' 4"   (1.626 m)  Wt 365 lb (165.563 kg)  BMI 62.62 kg/m2  SpO2 100%  LMP 04/07/2012  Physical Exam CONSTITUTIONAL: Well developed/well nourished HEAD: Normocephalic/atraumatic EYES: EOMI/PERRL, no icterus ENMT: Mucous membranes moist NECK: supple no meningeal signs SPINE:entire spine nontender CV: S1/S2 noted, no murmurs/rubs/gallops noted LUNGS: Lungs are clear to auscultation bilaterally, no apparent distress ABDOMEN: soft, mild epigastric tenderness noted, no rebound or guarding GU:no cva tenderness NEURO: Pt is awake/alert, moves all extremitiesx4 EXTREMITIES: pulses normal, full ROM SKIN: warm, color normal PSYCH: no abnormalities of mood noted  ED Course  Procedures   Labs Reviewed  URINALYSIS, ROUTINE W REFLEX MICROSCOPIC  COMPREHENSIVE METABOLIC PANEL  CBC WITH DIFFERENTIAL  LIPASE, BLOOD  POCT PREGNANCY, URINE   8:02 PM Pt with epigastric tenderness but no other focal tenderness noted Labs ordered and will treat pain and reassess 9:10 PM Pt stable.  Reports only minimal improvement with pain meds Her abdomen is soft.  She does have epigastric tenderness but negative murphy's sign.   No lower abdominal tenderness noted I doubt acute appendicitis.  Biliary colic is possible, though clinically not in acute cholecystitis She denies known h/o ulcer, or h/o frequent NSAID use  10:02 PM Pt improved, resting comfortably.  Her abdominal exam is  completely benign.  I doubt acute cholecysitis or acute appendicitis.  I have discussed strict return precautions with patient/family and they agree with plan  MDM  Nursing notes including past medical history and social history reviewed and considered in documentation Labs/vital reviewed and considered         Joya Gaskins, MD 04/27/12 2203

## 2012-04-27 NOTE — ED Notes (Signed)
Pt alert & oriented x4, stable gait. Patient given discharge instructions, paperwork & prescription(s). Patient  instructed to stop at the registration desk to finish any additional paperwork. Patient verbalized understanding. Pt left department w/ no further questions. 

## 2012-08-08 ENCOUNTER — Encounter (HOSPITAL_COMMUNITY): Payer: Self-pay | Admitting: *Deleted

## 2012-08-08 ENCOUNTER — Emergency Department (HOSPITAL_COMMUNITY)
Admission: EM | Admit: 2012-08-08 | Discharge: 2012-08-08 | Disposition: A | Discharge status: Home / Self Care | Payer: BC Managed Care – PPO | Attending: Emergency Medicine | Admitting: Emergency Medicine

## 2012-08-08 DIAGNOSIS — Z8659 Personal history of other mental and behavioral disorders: Secondary | ICD-10-CM | POA: Insufficient documentation

## 2012-08-08 DIAGNOSIS — J3489 Other specified disorders of nose and nasal sinuses: Secondary | ICD-10-CM | POA: Insufficient documentation

## 2012-08-08 DIAGNOSIS — Z87891 Personal history of nicotine dependence: Secondary | ICD-10-CM | POA: Insufficient documentation

## 2012-08-08 DIAGNOSIS — B349 Viral infection, unspecified: Secondary | ICD-10-CM

## 2012-08-08 DIAGNOSIS — R05 Cough: Secondary | ICD-10-CM | POA: Insufficient documentation

## 2012-08-08 DIAGNOSIS — B9789 Other viral agents as the cause of diseases classified elsewhere: Secondary | ICD-10-CM | POA: Insufficient documentation

## 2012-08-08 DIAGNOSIS — R059 Cough, unspecified: Secondary | ICD-10-CM | POA: Insufficient documentation

## 2012-08-08 NOTE — ED Provider Notes (Signed)
History    This chart was scribed for Joya Gaskins, MD by Quintella Reichert, ED scribe.  This patient was seen in room [facility] and the patient's care was started at 11:50 PM.   CSN: [id number]  Arrival date & time 08/08/12  2242      Chief Complaint  Patient presents with  . Sore Throat     Patient is a 27 y.o. female presenting with pharyngitis. The history is provided by the patient. No language interpreter was used.  Sore Throat This is a new problem. The current episode started 12 to 24 hours ago. The problem occurs constantly. The problem has not changed since onset.Pertinent negatives include no chest pain, no abdominal pain, no headaches and no shortness of breath. Nothing aggravates the symptoms. Nothing relieves the symptoms. Treatments tried: Liquid Benadryl. The treatment provided no relief.    HPI Comments: Brooke [name] is a 27 y.o. female who presents to the Emergency Department complaining of a feeling of throat swelling that began today.  Pt reports that it feels as if there is phlegm stuck at the back of her throat.  She states it is uncomfortable but not painful.  Pt also complains of intermittent dry cough.  She denies fever, emesis, fluid intolerance, difficulty swallowing, difficulty breathing, or any other associated symptoms.  Sook liquid Benadryl earlier today, without relief.   Past Medical History  Diagnosis Date  . Anxiety   . Edema     History reviewed. No pertinent past surgical history.  Family History  Problem Relation Age of Onset  . Diabetes Mother     History  Substance Use Topics  . Smoking status: Former Smoker    Quit date: 09/16/2007  . Smokeless tobacco: Never Used  . Alcohol Use: No    OB History   Grav Para Term Preterm Abortions TAB SAB Ect Mult Living   1    1   1   0      Review of Systems  HENT: Positive for congestion.   Respiratory: Negative for shortness of breath.   Cardiovascular: Negative for chest  pain.  Gastrointestinal: Negative for abdominal pain.  Neurological: Negative for headaches.    Allergies  Latex  Home Medications   Current Outpatient Rx  Name  Route  Sig  Dispense  Refill  . docusate sodium (COLACE) 100 MG capsule   Oral   Take 1 capsule (100 mg total) by mouth every 12 (twelve) hours.   60 capsule   0   . ferrous sulfate 325 (65 FE) MG tablet   Oral   Take 325 mg by mouth every morning.           BP 126/75  Pulse 84  Temp(Src) 98.9 F (37.2 C) (Oral)  Resp 20  Ht 5\' 4"  (1.626 m)  Wt 366 lb (166.017 kg)  BMI 62.79 kg/m2  SpO2 99%  LMP 08/01/2012  Physical Exam  Nursing note and vitals reviewed.  CONSTITUTIONAL: Well developed/well nourished HEAD: Normocephalic/atraumatic EYES: EOMI/PERRL ENMT: Mucous membranes moist.  Uvula midline.  No exudates noted. No edema noted to posterior oropharynx NECK: supple no meningeal signs CV: S1/S2 noted, no murmurs/rubs/gallops noted LUNGS: Lungs are clear to auscultation bilaterally, no apparent distress ABDOMEN: soft, nontender, no rebound or guarding GU:no cva tenderness NEURO: Pt is awake/alert, moves all extremitiesx4 EXTREMITIES: pulses normal, full ROM SKIN: warm, color normal PSYCH: no abnormalities of mood noted   ED Course  Procedures (including critical care time)  DIAGNOSTIC  STUDIES: Oxygen Saturation is 99% on room air, normal by my interpretation.    COORDINATION OF CARE: 11:52 PM-Informed pt that symptoms are due to a viral URI.  Discussed treatment plan which includes symptomatic relief with OTC medications with pt at bedside and pt agreed to plan.      Labs Reviewed  RAPID STREP SCREEN  CULTURE, GROUP A STREP     MDM  Nursing notes including past medical history and social history reviewed and considered in documentation Labs/vital reviewed and considered       I personally performed the services described in this documentation, which was scribed in my presence. The  recorded information has been reviewed and is accurate.      Joya Gaskins, MD 08/09/12 [phone]

## 2012-08-08 NOTE — ED Notes (Signed)
Sore throat, feels swollen,  Nasal congestion

## 2012-08-11 LAB — CULTURE, GROUP A STREP

## 2012-08-18 ENCOUNTER — Emergency Department (HOSPITAL_COMMUNITY): Payer: BC Managed Care – PPO

## 2012-08-18 ENCOUNTER — Emergency Department (HOSPITAL_COMMUNITY)
Admission: EM | Admit: 2012-08-18 | Discharge: 2012-08-18 | Disposition: A | Discharge status: Home / Self Care | Payer: BC Managed Care – PPO | Attending: Emergency Medicine | Admitting: Emergency Medicine

## 2012-08-18 ENCOUNTER — Encounter (HOSPITAL_COMMUNITY): Payer: Self-pay | Admitting: *Deleted

## 2012-08-18 DIAGNOSIS — S4980XA Other specified injuries of shoulder and upper arm, unspecified arm, initial encounter: Secondary | ICD-10-CM | POA: Insufficient documentation

## 2012-08-18 DIAGNOSIS — Z3202 Encounter for pregnancy test, result negative: Secondary | ICD-10-CM | POA: Insufficient documentation

## 2012-08-18 DIAGNOSIS — S7001XA Contusion of right hip, initial encounter: Secondary | ICD-10-CM

## 2012-08-18 DIAGNOSIS — S3981XA Other specified injuries of abdomen, initial encounter: Secondary | ICD-10-CM | POA: Insufficient documentation

## 2012-08-18 DIAGNOSIS — IMO0002 Reserved for concepts with insufficient information to code with codable children: Secondary | ICD-10-CM | POA: Insufficient documentation

## 2012-08-18 DIAGNOSIS — S8991XA Unspecified injury of right lower leg, initial encounter: Secondary | ICD-10-CM

## 2012-08-18 DIAGNOSIS — Y9389 Activity, other specified: Secondary | ICD-10-CM | POA: Insufficient documentation

## 2012-08-18 DIAGNOSIS — S46909A Unspecified injury of unspecified muscle, fascia and tendon at shoulder and upper arm level, unspecified arm, initial encounter: Secondary | ICD-10-CM | POA: Insufficient documentation

## 2012-08-18 DIAGNOSIS — Y92410 Unspecified street and highway as the place of occurrence of the external cause: Secondary | ICD-10-CM | POA: Insufficient documentation

## 2012-08-18 DIAGNOSIS — F411 Generalized anxiety disorder: Secondary | ICD-10-CM | POA: Insufficient documentation

## 2012-08-18 DIAGNOSIS — R109 Unspecified abdominal pain: Secondary | ICD-10-CM

## 2012-08-18 DIAGNOSIS — S7000XA Contusion of unspecified hip, initial encounter: Secondary | ICD-10-CM | POA: Insufficient documentation

## 2012-08-18 DIAGNOSIS — Z79899 Other long term (current) drug therapy: Secondary | ICD-10-CM | POA: Insufficient documentation

## 2012-08-18 DIAGNOSIS — Z87891 Personal history of nicotine dependence: Secondary | ICD-10-CM | POA: Insufficient documentation

## 2012-08-18 DIAGNOSIS — S8990XA Unspecified injury of unspecified lower leg, initial encounter: Secondary | ICD-10-CM | POA: Insufficient documentation

## 2012-08-18 DIAGNOSIS — Z91040 Latex allergy status: Secondary | ICD-10-CM | POA: Insufficient documentation

## 2012-08-18 LAB — CBC WITH DIFFERENTIAL/PLATELET
Basophils Absolute: 0 10*3/uL (ref 0.0–0.1)
Eosinophils Absolute: 0.1 10*3/uL (ref 0.0–0.7)
Lymphocytes Relative: 44 % (ref 12–46)
Lymphs Abs: 2.7 10*3/uL (ref 0.7–4.0)
Neutrophils Relative %: 50 % (ref 43–77)
Platelets: 274 10*3/uL (ref 150–400)
RBC: 4.15 MIL/uL (ref 3.87–5.11)
RDW: 14.5 % (ref 11.5–15.5)
WBC: 6.1 10*3/uL (ref 4.0–10.5)

## 2012-08-18 LAB — BASIC METABOLIC PANEL
CO2: 29 mEq/L (ref 19–32)
Calcium: 9 mg/dL (ref 8.4–10.5)
GFR calc non Af Amer: >90 mL/min (ref >90)
Glucose, Bld: 97 mg/dL (ref 70–99)
Potassium: 3.5 mEq/L (ref 3.5–5.1)
Sodium: 137 mEq/L (ref 135–145)

## 2012-08-18 LAB — POCT PREGNANCY, URINE: Preg Test, Ur: NEGATIVE

## 2012-08-18 MED ORDER — ONDANSETRON HCL 4 MG/2ML IJ SOLN
4.0000 mg | Freq: Once | INTRAMUSCULAR | Status: AC
  Administered 2012-08-18: 4 mg via INTRAVENOUS
  Filled 2012-08-18: qty 2

## 2012-08-18 MED ORDER — SODIUM CHLORIDE 0.9 % IV SOLN
INTRAVENOUS | Status: DC
  Administered 2012-08-18: 10:00:00 via INTRAVENOUS

## 2012-08-18 MED ORDER — NAPROXEN 500 MG PO TABS: 500.0000 mg | ORAL_TABLET | Freq: Two times a day (BID) | ORAL | Status: DC

## 2012-08-18 MED ORDER — IOHEXOL 300 MG/ML  SOLN
100.0000 mL | Freq: Once | INTRAMUSCULAR | Status: AC | PRN
  Administered 2012-08-18: 100 mL via INTRAVENOUS

## 2012-08-18 MED ORDER — HYDROMORPHONE HCL PF 1 MG/ML IJ SOLN
0.5000 mg | Freq: Once | INTRAMUSCULAR | Status: AC
  Administered 2012-08-18: 0.5 mg via INTRAVENOUS
  Filled 2012-08-18: qty 1

## 2012-08-18 MED ORDER — SODIUM CHLORIDE 0.9 % IV BOLUS (SEPSIS)
500.0000 mL | Freq: Once | INTRAVENOUS | Status: AC
  Administered 2012-08-18: 500 mL via INTRAVENOUS

## 2012-08-18 MED ORDER — HYDROCODONE-ACETAMINOPHEN 5-325 MG PO TABS: 1.0000 | ORAL_TABLET | Freq: Four times a day (QID) | ORAL | Status: DC | PRN
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
## 2012-08-18 NOTE — ED Notes (Signed)
Patient with no complaints at this time. Respirations even and unlabored. Skin warm/dry. Discharge instructions reviewed with patient at this time. Patient given opportunity to voice concerns/ask questions. IV removed per policy and band-aid applied to site. Patient discharged at this time and left Emergency Department with steady gait.  

## 2013-01-06 ENCOUNTER — Encounter (HOSPITAL_COMMUNITY): Payer: Self-pay | Admitting: Emergency Medicine

## 2013-01-06 DIAGNOSIS — Z791 Long term (current) use of non-steroidal anti-inflammatories (NSAID): Secondary | ICD-10-CM | POA: Insufficient documentation

## 2013-01-06 DIAGNOSIS — Z79899 Other long term (current) drug therapy: Secondary | ICD-10-CM | POA: Insufficient documentation

## 2013-01-06 DIAGNOSIS — IMO0002 Reserved for concepts with insufficient information to code with codable children: Secondary | ICD-10-CM | POA: Insufficient documentation

## 2013-01-06 DIAGNOSIS — F411 Generalized anxiety disorder: Secondary | ICD-10-CM | POA: Insufficient documentation

## 2013-01-06 DIAGNOSIS — Z87891 Personal history of nicotine dependence: Secondary | ICD-10-CM | POA: Insufficient documentation

## 2013-01-06 DIAGNOSIS — R209 Unspecified disturbances of skin sensation: Secondary | ICD-10-CM | POA: Insufficient documentation

## 2013-01-06 DIAGNOSIS — M5430 Sciatica, unspecified side: Secondary | ICD-10-CM | POA: Insufficient documentation

## 2013-01-06 DIAGNOSIS — Z91040 Latex allergy status: Secondary | ICD-10-CM | POA: Insufficient documentation

## 2013-01-06 DIAGNOSIS — G8911 Acute pain due to trauma: Secondary | ICD-10-CM | POA: Insufficient documentation
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
## 2013-01-07 ENCOUNTER — Emergency Department (HOSPITAL_COMMUNITY)
Admission: EM | Admit: 2013-01-07 | Discharge: 2013-01-07 | Disposition: A | Discharge status: Home / Self Care | Payer: BC Managed Care – PPO | Attending: Emergency Medicine | Admitting: Emergency Medicine

## 2013-01-07 DIAGNOSIS — M5432 Sciatica, left side: Secondary | ICD-10-CM

## 2013-01-07 MED ORDER — DICLOFENAC SODIUM 75 MG PO TBEC: 75.0000 mg | DELAYED_RELEASE_TABLET | Freq: Two times a day (BID) | ORAL | Status: DC

## 2013-01-07 MED ORDER — ONDANSETRON HCL 4 MG PO TABS
4.0000 mg | ORAL_TABLET | Freq: Once | ORAL | Status: AC
  Administered 2013-01-07: 4 mg via ORAL
  Filled 2013-01-07: qty 1

## 2013-01-07 MED ORDER — HYDROCODONE-ACETAMINOPHEN 5-325 MG PO TABS
2.0000 | ORAL_TABLET | Freq: Once | ORAL | Status: AC
  Administered 2013-01-07: 2 via ORAL
  Filled 2013-01-07: qty 2

## 2013-01-07 MED ORDER — PREDNISONE 50 MG PO TABS
60.0000 mg | ORAL_TABLET | Freq: Once | ORAL | Status: AC
  Administered 2013-01-07: 02:00:00 60 mg via ORAL
  Filled 2013-01-07 (×2): qty 1

## 2013-01-07 MED ORDER — HYDROCODONE-ACETAMINOPHEN 5-325 MG PO TABS: 1.0000 | ORAL_TABLET | ORAL | Status: DC | PRN

## 2013-01-07 MED ORDER — DEXAMETHASONE 6 MG PO TABS: ORAL_TABLET | ORAL | Status: DC

## 2013-01-07 MED ORDER — KETOROLAC TROMETHAMINE 10 MG PO TABS
10.0000 mg | ORAL_TABLET | Freq: Once | ORAL | Status: AC
  Administered 2013-01-07: 10 mg via ORAL
  Filled 2013-01-07: qty 1
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
## 2013-01-07 NOTE — ED Notes (Signed)
Pt alert & oriented x4, stable gait. Patient given discharge instructions, paperwork & prescription(s). Patient  instructed to stop at the registration desk to finish any additional paperwork. Patient verbalized understanding. Pt left department w/ no further questions. 

## 2013-01-07 NOTE — ED Provider Notes (Signed)
Medical screening examination/treatment/procedure(s) were performed by non-physician practitioner and as supervising physician I was immediately available for consultation/collaboration.    Vida Roller, MD 01/07/13 [phone]

## 2013-01-15 ENCOUNTER — Encounter (HOSPITAL_COMMUNITY): Payer: Self-pay | Admitting: Emergency Medicine

## 2013-01-15 ENCOUNTER — Emergency Department (HOSPITAL_COMMUNITY)
Admission: EM | Admit: 2013-01-15 | Discharge: 2013-01-15 | Disposition: A | Discharge status: Home / Self Care | Payer: BC Managed Care – PPO | Attending: Emergency Medicine | Admitting: Emergency Medicine

## 2013-01-15 DIAGNOSIS — F411 Generalized anxiety disorder: Secondary | ICD-10-CM | POA: Insufficient documentation

## 2013-01-15 DIAGNOSIS — M549 Dorsalgia, unspecified: Secondary | ICD-10-CM | POA: Insufficient documentation

## 2013-01-15 DIAGNOSIS — Z8639 Personal history of other endocrine, nutritional and metabolic disease: Secondary | ICD-10-CM

## 2013-01-15 DIAGNOSIS — Z3202 Encounter for pregnancy test, result negative: Secondary | ICD-10-CM | POA: Insufficient documentation

## 2013-01-15 DIAGNOSIS — E119 Type 2 diabetes mellitus without complications: Secondary | ICD-10-CM | POA: Insufficient documentation

## 2013-01-15 DIAGNOSIS — Z79899 Other long term (current) drug therapy: Secondary | ICD-10-CM | POA: Insufficient documentation

## 2013-01-15 DIAGNOSIS — Z792 Long term (current) use of antibiotics: Secondary | ICD-10-CM | POA: Insufficient documentation

## 2013-01-15 DIAGNOSIS — Z87891 Personal history of nicotine dependence: Secondary | ICD-10-CM | POA: Insufficient documentation

## 2013-01-15 DIAGNOSIS — G8929 Other chronic pain: Secondary | ICD-10-CM | POA: Insufficient documentation

## 2013-01-15 DIAGNOSIS — N390 Urinary tract infection, site not specified: Secondary | ICD-10-CM

## 2013-01-15 DIAGNOSIS — Z91040 Latex allergy status: Secondary | ICD-10-CM | POA: Insufficient documentation

## 2013-01-15 DIAGNOSIS — IMO0002 Reserved for concepts with insufficient information to code with codable children: Secondary | ICD-10-CM | POA: Insufficient documentation

## 2013-01-15 DIAGNOSIS — R609 Edema, unspecified: Secondary | ICD-10-CM | POA: Insufficient documentation

## 2013-01-15 HISTORY — DX: Dorsalgia, unspecified: M54.9

## 2013-01-15 HISTORY — DX: Type 2 diabetes mellitus without complications: E11.9

## 2013-01-15 HISTORY — DX: Other chronic pain: G89.29

## 2013-01-15 LAB — URINALYSIS, ROUTINE W REFLEX MICROSCOPIC
Bilirubin Urine: NEGATIVE
Nitrite: NEGATIVE
Protein, ur: NEGATIVE mg/dL
Specific Gravity, Urine: 1.02 (ref 1.005–1.030)
pH: 7 (ref 5.0–8.0)

## 2013-01-15 LAB — BASIC METABOLIC PANEL
BUN: 17 mg/dL (ref 6–23)
Chloride: 97 mEq/L (ref 96–112)
Creatinine, Ser: 0.87 mg/dL (ref 0.50–1.10)
GFR calc Af Amer: >90 mL/min (ref >90)
GFR calc non Af Amer: >90 mL/min (ref >90)
Potassium: 4.1 mEq/L (ref 3.5–5.1)
Sodium: 134 mEq/L — ABNORMAL LOW (ref 135–145)

## 2013-01-15 LAB — CBC WITH DIFFERENTIAL/PLATELET
Basophils Absolute: 0 10*3/uL (ref 0.0–0.1)
Basophils Relative: 0 % (ref 0–1)
Hemoglobin: 13.1 g/dL (ref 12.0–15.0)
Lymphocytes Relative: 29 % (ref 12–46)
MCHC: 33.6 g/dL (ref 30.0–36.0)
Monocytes Relative: 5 % (ref 3–12)
Neutro Abs: 10.8 10*3/uL — ABNORMAL HIGH (ref 1.7–7.7)
Neutrophils Relative %: 65 % (ref 43–77)
RDW: 14.3 % (ref 11.5–15.5)

## 2013-01-15 LAB — BLOOD GAS, ARTERIAL
Acid-Base Excess: 3.3 mmol/L — ABNORMAL HIGH (ref 0.0–2.0)
Bicarbonate: 27 mEq/L — ABNORMAL HIGH (ref 20.0–24.0)
FIO2: 21 %
O2 Saturation: 97.8 %
Patient temperature: 37
TCO2: 23.9 mmol/L (ref 0–100)
pH, Arterial: 7.457 — ABNORMAL HIGH (ref 7.350–7.450)

## 2013-01-15 LAB — URINE MICROSCOPIC-ADD ON

## 2013-01-15 MED ORDER — CEPHALEXIN 500 MG PO CAPS: 500.0000 mg | ORAL_CAPSULE | Freq: Four times a day (QID) | ORAL | Status: DC

## 2013-01-15 NOTE — ED Provider Notes (Signed)
CSN: [id number]     Arrival date & time 01/15/13  1101 History   First MD Initiated Contact with Patient 01/15/13 1144     Chief Complaint  Patient presents with  . Hyperglycemia    HPI Pt was seen at 1230.  Per pt and her family, c/o gradual onset and persistence of multiple intermittent episodes of "high blood sugar" for the past several days. States she has been experiencing blurred vision, frequent urination and excessive thirst for the past several weeks. Pt was seen by her Eye doctor this morning and told to go to the ED to "have my blood sugar checked out." States her mother has been checking her CBG's for the past few days: CBG was "in the 200's" yesterday and "500" this morning. Denies N/V/D, no abd pain, no CP/SOB, no fevers, no rash, no eye pain, no headache.     Past Medical History  Diagnosis Date  . Anxiety   . Edema   . Diabetes mellitus without complication   . Chronic back pain    History reviewed. No pertinent past surgical history.  Family History  Problem Relation Age of Onset  . Diabetes Mother    History  Substance Use Topics  . Smoking status: Former Smoker    Quit date: 09/16/2007  . Smokeless tobacco: Never Used  . Alcohol Use: No   OB History   Grav Para Term Preterm Abortions TAB SAB Ect Mult Living   1    1   1   0     Review of Systems ROS: Statement: All systems negative except as marked or noted in the HPI; Constitutional: Negative for fever and chills. +excessive thirst. ; ; Eyes: +blurry vision. Negative for eye pain, redness and discharge. ; ; ENMT: Negative for ear pain, hoarseness, nasal congestion, sinus pressure and sore throat. ; ; Cardiovascular: Negative for chest pain, palpitations, diaphoresis, dyspnea and peripheral edema. ; ; Respiratory: Negative for cough, wheezing and stridor. ; ; Gastrointestinal: Negative for nausea, vomiting, diarrhea, abdominal pain, blood in stool, hematemesis, jaundice and rectal bleeding. . ; ;  Genitourinary: +frequent urination. Negative for dysuria, flank pain and hematuria. ; ; Musculoskeletal: Negative for back pain and neck pain. Negative for swelling and trauma.; ; Skin: Negative for pruritus, rash, abrasions, blisters, bruising and skin lesion.; ; Neuro: Negative for headache, lightheadedness and neck stiffness. Negative for weakness, altered level of consciousness , altered mental status, extremity weakness, paresthesias, involuntary movement, seizure and syncope.       Allergies  Latex  Home Medications   Current Outpatient Rx  Name  Route  Sig  Dispense  Refill  . cyclobenzaprine (FLEXERIL) 10 MG tablet   Oral   Take 1 tablet by mouth 3 (three) times daily as needed for muscle spasms.          . ferrous sulfate 325 (65 FE) MG tablet   Oral   Take 325 mg by mouth every morning.         . fluticasone (FLONASE) 50 MCG/ACT nasal spray   Nasal   Place 2 sprays into the nose daily.         [name] HYDROcodone-acetaminophen (NORCO) 5-325 MG per tablet   Oral   Take 1 tablet by mouth every 4 (four) hours as needed for moderate pain.   15 tablet   0   . cephALEXin (KEFLEX) 500 MG capsule   Oral   Take 1 capsule (500 mg total) by mouth 4 (four) times daily.  40 capsule   0   . furosemide (LASIX) 20 MG tablet   Oral   Take 20 mg by mouth daily.          BP 105/56  Pulse 69  Temp(Src) 98.2 F (36.8 C) (Oral)  Resp 18  Ht 5\' 4"  (1.626 m)  Wt 384 lb (174.181 kg)  BMI 65.88 kg/m2  SpO2 98%  LMP 12/30/2012 Physical Exam 1235: Physical examination:  Nursing notes reviewed; Vital signs and O2 SAT reviewed;  Constitutional: Well developed, Well nourished, Well hydrated, In no acute distress; Head:  Normocephalic, atraumatic; Eyes: EOMI, PERRL, No scleral icterus; ENMT: Mouth and pharynx normal, Mucous membranes moist; Neck: Supple, Full range of motion, No lymphadenopathy; Cardiovascular: Regular rate and rhythm, No murmur, rub, or gallop; Respiratory: Breath  sounds clear & equal bilaterally, No rales, rhonchi, wheezes.  Speaking full sentences with ease, Normal respiratory effort/excursion; Chest: Nontender, Movement normal; Abdomen: Soft, Nontender, Nondistended, Normal bowel sounds; Genitourinary: No CVA tenderness; Extremities: Pulses normal, No tenderness, No edema, No calf edema or asymmetry.; Neuro: AA&Ox3, Major CN grossly intact.  Speech clear. No gross focal motor or sensory deficits in extremities.; Skin: Color normal, Warm, Dry.   ED Course  Procedures   EKG Interpretation   None       MDM  MDM Reviewed: previous chart, nursing note and vitals Reviewed previous: labs Interpretation: labs     Results for orders placed during the hospital encounter of 01/15/13  CBC WITH DIFFERENTIAL      Result Value Range   WBC 16.5 (*) 4.0 - 10.5 K/uL   RBC 4.75  3.87 - 5.11 MIL/uL   Hemoglobin 13.1  12.0 - 15.0 g/dL   HCT 16.1  09.6 - 04.5 %   MCV 82.1  78.0 - 100.0 fL   MCH 27.6  26.0 - 34.0 pg   MCHC 33.6  30.0 - 36.0 g/dL   RDW 40.9  81.1 - 91.4 %   Platelets 344  150 - 400 K/uL   Neutrophils Relative % 65  43 - 77 %   Neutro Abs 10.8 (*) 1.7 - 7.7 K/uL   Lymphocytes Relative 29  12 - 46 %   Lymphs Abs 4.8 (*) 0.7 - 4.0 K/uL   Monocytes Relative 5  3 - 12 %   Monocytes Absolute 0.9  0.1 - 1.0 K/uL   Eosinophils Relative 0  0 - 5 %   Eosinophils Absolute 0.0  0.0 - 0.7 K/uL   Basophils Relative 0  0 - 1 %   Basophils Absolute 0.0  0.0 - 0.1 K/uL  BASIC METABOLIC PANEL      Result Value Range   Sodium 134 (*) 135 - 145 mEq/L   Potassium 4.1  3.5 - 5.1 mEq/L   Chloride 97  96 - 112 mEq/L   CO2 31  19 - 32 mEq/L   Glucose, Bld 119 (*) 70 - 99 mg/dL   BUN 17  6 - 23 mg/dL   Creatinine, Ser 7.82  0.50 - 1.10 mg/dL   Calcium 8.9  8.4 - 95.6 mg/dL   GFR calc non Af Amer >90  >90 mL/min   GFR calc Af Amer >90  >90 mL/min  URINALYSIS, ROUTINE W REFLEX MICROSCOPIC      Result Value Range   Color, Urine YELLOW  YELLOW    APPearance CLEAR  CLEAR   Specific Gravity, Urine 1.020  1.005 - 1.030   pH 7.0  5.0 - 8.0  Glucose, UA NEGATIVE  NEGATIVE mg/dL   Hgb urine dipstick TRACE (*) NEGATIVE   Bilirubin Urine NEGATIVE  NEGATIVE   Ketones, ur NEGATIVE  NEGATIVE mg/dL   Protein, ur NEGATIVE  NEGATIVE mg/dL   Urobilinogen, UA 0.2  0.0 - 1.0 mg/dL   Nitrite NEGATIVE  NEGATIVE   Leukocytes, UA MODERATE (*) NEGATIVE  PREGNANCY, URINE      Result Value Range   Preg Test, Ur NEGATIVE  NEGATIVE  BLOOD GAS, ARTERIAL      Result Value Range   FIO2 21.00     pH, Arterial 7.457 (*) 7.350 - 7.450   pCO2 arterial 38.8  35.0 - 45.0 mmHg   pO2, Arterial 102.0 (*) 80.0 - 100.0 mmHg   Bicarbonate 27.0 (*) 20.0 - 24.0 mEq/L   TCO2 23.9  0 - 100 mmol/L   Acid-Base Excess 3.3 (*) 0.0 - 2.0 mmol/L   O2 Saturation 97.8     Patient temperature 37.0     Collection site RIGHT BRACHIAL     Drawn by [id number]     Sample type ARTERIAL     Allens test (pass/fail) PASS  PASS  GLUCOSE, CAPILLARY      Result Value Range   Glucose-Capillary 109 (*) 70 - 99 mg/dL  URINE MICROSCOPIC-ADD ON      Result Value Range   Squamous Epithelial / LPF MANY (*) RARE   WBC, UA TOO NUMEROUS TO COUNT  <3 WBC/hpf   RBC / HPF 3-6  <3 RBC/hpf   Bacteria, UA MANY (*) RARE   Urine-Other TRICHOMONAS PRESENT      1415:  +UTI, will tx. CBG while in the ED remains 100-110's. Not acidotic with AG 6. Will not rx DM meds at this time, long d/w pt and family regarding same. Pt will start DM diet and slowly increase her activity level/exercise until seen in f/u by her PMD. Pt will reschedule her eye doctor's appt. Pt and family agreeable with this plan. Pt wants to go home now. Dx and testing d/w pt and family.  Questions answered.  Verb understanding, agreeable to d/c home with outpt f/u.     Laray Anger, DO 01/18/13 1040

## 2013-01-15 NOTE — ED Notes (Signed)
Dx with borderline DM. Checked sugar this am and was over 500. Blurred vision started Sunday. Urinating a lot. Denies pain. Mm slightly moist. Alert/oreinted. nad at this time.

## 2013-01-16 ENCOUNTER — Other Ambulatory Visit: Payer: Self-pay | Admitting: Internal Medicine

## 2013-01-16 DIAGNOSIS — M5430 Sciatica, unspecified side: Secondary | ICD-10-CM

## 2013-01-16 LAB — URINE CULTURE: Colony Count: 10000

## 2013-01-30 ENCOUNTER — Ambulatory Visit
Admission: RE | Admit: 2013-01-30 | Discharge: 2013-01-30 | Disposition: A | Discharge status: Home / Self Care | Payer: BC Managed Care – PPO | Source: Ambulatory Visit | Attending: Internal Medicine | Admitting: Internal Medicine

## 2013-01-30 DIAGNOSIS — M5430 Sciatica, unspecified side: Secondary | ICD-10-CM

## 2013-04-08 ENCOUNTER — Ambulatory Visit: Payer: Self-pay | Admitting: Obstetrics & Gynecology

## 2013-04-15 ENCOUNTER — Ambulatory Visit: Payer: BC Managed Care – PPO | Admitting: Obstetrics & Gynecology

## 2013-04-18 ENCOUNTER — Ambulatory Visit: Payer: BC Managed Care – PPO | Admitting: Obstetrics & Gynecology

## 2013-04-19 ENCOUNTER — Emergency Department (HOSPITAL_COMMUNITY)
Admission: EM | Admit: 2013-04-19 | Discharge: 2013-04-19 | Disposition: A | Discharge status: Home / Self Care | Payer: BC Managed Care – PPO | Attending: Emergency Medicine | Admitting: Emergency Medicine

## 2013-04-19 ENCOUNTER — Encounter (HOSPITAL_COMMUNITY): Payer: Self-pay | Admitting: Emergency Medicine

## 2013-04-19 DIAGNOSIS — Z8659 Personal history of other mental and behavioral disorders: Secondary | ICD-10-CM | POA: Insufficient documentation

## 2013-04-19 DIAGNOSIS — G8929 Other chronic pain: Secondary | ICD-10-CM | POA: Insufficient documentation

## 2013-04-19 DIAGNOSIS — M549 Dorsalgia, unspecified: Secondary | ICD-10-CM | POA: Insufficient documentation

## 2013-04-19 DIAGNOSIS — R040 Epistaxis: Secondary | ICD-10-CM | POA: Insufficient documentation

## 2013-04-19 DIAGNOSIS — Z87891 Personal history of nicotine dependence: Secondary | ICD-10-CM | POA: Insufficient documentation

## 2013-04-19 DIAGNOSIS — R51 Headache: Secondary | ICD-10-CM | POA: Insufficient documentation

## 2013-04-19 DIAGNOSIS — Z79899 Other long term (current) drug therapy: Secondary | ICD-10-CM | POA: Insufficient documentation

## 2013-04-19 DIAGNOSIS — R519 Headache, unspecified: Secondary | ICD-10-CM

## 2013-04-19 DIAGNOSIS — Z792 Long term (current) use of antibiotics: Secondary | ICD-10-CM | POA: Insufficient documentation

## 2013-04-19 DIAGNOSIS — R609 Edema, unspecified: Secondary | ICD-10-CM | POA: Insufficient documentation

## 2013-04-19 DIAGNOSIS — Z91040 Latex allergy status: Secondary | ICD-10-CM | POA: Insufficient documentation

## 2013-04-19 DIAGNOSIS — IMO0002 Reserved for concepts with insufficient information to code with codable children: Secondary | ICD-10-CM | POA: Insufficient documentation

## 2013-04-19 MED ORDER — IBUPROFEN 800 MG PO TABS
800.0000 mg | ORAL_TABLET | Freq: Once | ORAL | Status: AC
  Administered 2013-04-19: 800 mg via ORAL
  Filled 2013-04-19: qty 1

## 2013-04-19 MED ORDER — CYCLOBENZAPRINE HCL 10 MG PO TABS
10.0000 mg | ORAL_TABLET | Freq: Once | ORAL | Status: AC
  Administered 2013-04-19: 10 mg via ORAL
  Filled 2013-04-19: qty 1

## 2013-04-19 MED ORDER — CYCLOBENZAPRINE HCL 10 MG PO TABS: 10.0000 mg | ORAL_TABLET | Freq: Two times a day (BID) | ORAL | Status: DC | PRN

## 2013-04-19 MED ORDER — DIPHENHYDRAMINE HCL 25 MG PO CAPS
25.0000 mg | ORAL_CAPSULE | Freq: Once | ORAL | Status: AC
  Administered 2013-04-19: 25 mg via ORAL
  Filled 2013-04-19: qty 1

## 2013-04-19 NOTE — ED Provider Notes (Signed)
CSN: [id number]     Arrival date & time 04/19/13  0004 History   First MD Initiated Contact with Patient 04/19/13 0056     Chief Complaint  Patient presents with  . Headache  . Hypertension  . Epistaxis     (Consider location/radiation/quality/duration/timing/severity/associated sxs/prior Treatment) Patient is a 28 y.o. female presenting with headaches, hypertension, and nosebleeds. The history is provided by the patient.  Headache Hypertension Associated symptoms include headaches.  Epistaxis Associated symptoms: headaches   She is been having headaches for about the last week. Headaches are right temporal in location and described as a severe sharp, throbbing pain which will last a few minutes before resolving and then recur 5 or 10 minutes later. Pain is rated at 10/10 when present and 0/10 in between episodes. Nothing makes it better nothing makes it worse. Headaches have never woken her up or nor kept her from sleeping. There is no associated. There's been no photophobia or phonophobia. She's also noted that when she blows her nose hard, there are sometimes some slight streaks of blood. There's been no bleeding except when she blows her nose hard. Today, she checked her blood pressure at home and it was elevated to 200/140. However, she states that she had to struggle to get the cuff to fit in her arm.  Past Medical History  Diagnosis Date  . Anxiety   . Edema   . Chronic back pain    History reviewed. No pertinent past surgical history. Family History  Problem Relation Age of Onset  . Diabetes Mother    History  Substance Use Topics  . Smoking status: Former Smoker    Quit date: 09/16/2007  . Smokeless tobacco: Never Used  . Alcohol Use: No   OB History   Grav Para Term Preterm Abortions TAB SAB Ect Mult Living   1    1   1   0     Review of Systems  HENT: Positive for nosebleeds.   Neurological: Positive for headaches.  All other systems reviewed and are  negative.      Allergies  Latex  Home Medications   Current Outpatient Rx  Name  Route  Sig  Dispense  Refill  . ferrous sulfate 325 (65 FE) MG tablet   Oral   Take 325 mg by mouth every morning.         . fluticasone (FLONASE) 50 MCG/ACT nasal spray   Nasal   Place 2 sprays into the nose daily.         . furosemide (LASIX) 20 MG tablet   Oral   Take 20 mg by mouth daily.         . cephALEXin (KEFLEX) 500 MG capsule   Oral   Take 1 capsule (500 mg total) by mouth 4 (four) times daily.   40 capsule   0   . cyclobenzaprine (FLEXERIL) 10 MG tablet   Oral   Take 1 tablet by mouth 3 (three) times daily as needed for muscle spasms.          [name] HYDROcodone-acetaminophen (NORCO) 5-325 MG per tablet   Oral   Take 1 tablet by mouth every 4 (four) hours as needed for moderate pain.   15 tablet   0    BP 125/71  Pulse 96  Temp(Src) 98.1 F (36.7 C) (Oral)  Resp 18  Ht 5\' 4"  (1.626 m)  Wt 375 lb 9 oz (170.354 kg)  BMI 64.43 kg/m2  SpO2 99%  LMP 03/01/2013 Physical Exam  Nursing note and vitals reviewed.  Morbidly obese 28 year old female, resting comfortably and in no acute distress. Vital signs are normal. Oxygen saturation is 99%, which is normal. Head is normocephalic and atraumatic. PERRLA, EOMI. Oropharynx is clear. Examination the nasal cavity shows no bleeding site. There is tenderness palpation over the right temporalis muscle which does reproduce her headache. Neck is nontender and supple without adenopathy or JVD. Back is nontender and there is no CVA tenderness. Lungs are clear without rales, wheezes, or rhonchi. Chest is nontender. Heart has regular rate and rhythm without murmur. Abdomen is soft, flat, nontender without masses or hepatosplenomegaly and peristalsis is normoactive. Extremities have trace edema, full range of motion is present. Skin is warm and dry without rash. Neurologic: Mental status is normal, cranial nerves are intact, there  are no motor or sensory deficits.  ED Course  Procedures (including critical care time)  MDM   Final diagnoses:  Headache  Epistaxis    Headache which seems to be muscle contraction headache. She'll be given a trial of cyclobenzaprine and is advised to use over-the-counter NSAIDs such as ibuprofen or naproxen. Epistaxis seems to be related purely to blowing her nose hard associated advised to decrease the force of note she exerts when she blows her nose. Hypertension at home is factitious and do to small blood pressure cuff-no evidence of hypertension today. She is discharged with prescription for cyclobenzaprine and is to followup with her PCP.    Dione Boozeavid Arjan Strohm, MD 04/19/13 [phone]
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
## 2013-04-19 NOTE — Discharge Instructions (Signed)
Take Ibuprofen or Naproxen as needed.  Tension Headache A tension headache is a feeling of pain, pressure, or aching often felt over the front and sides of the head. The pain can be dull or can feel tight (constricting). It is the most common type of headache. Tension headaches are not normally associated with nausea or vomiting and do not get worse with physical activity. Tension headaches can last 30 minutes to several days.  CAUSES  The exact cause is not known, but it may be caused by chemicals and hormones in the brain that lead to pain. Tension headaches often begin after stress, anxiety, or depression. Other triggers may include:  Alcohol.  Caffeine (too much or withdrawal).  Respiratory infections (colds, flu, sinus infections).  Dental problems or teeth clenching.  Fatigue.  Holding your head and neck in one position too long while using a computer. SYMPTOMS   Pressure around the head.   Dull, aching head pain.   Pain felt over the front and sides of the head.   Tenderness in the muscles of the head, neck, and shoulders. DIAGNOSIS  A tension headache is often diagnosed based on:   Symptoms.   Physical examination.   A CT scan or MRI of your head. These tests may be ordered if symptoms are severe or unusual. TREATMENT  Medicines may be given to help relieve symptoms.  HOME CARE INSTRUCTIONS   Only take over-the-counter or prescription medicines for pain or discomfort as directed by your caregiver.   Lie down in a dark, quiet room when you have a headache.   Keep a journal to find out what may be triggering your headaches. For example, write down:  What you eat and drink.  How much sleep you get.  Any change to your diet or medicines.  Try massage or other relaxation techniques.   Ice packs or heat applied to the head and neck can be used. Use these 3 to 4 times per day for 15 to 20 minutes each time, or as needed.   Limit stress.   Sit up  straight, and do not tense your muscles.   Quit smoking if you smoke.  Limit alcohol use.  Decrease the amount of caffeine you drink, or stop drinking caffeine.  Eat and exercise regularly.  Get 7 to 9 hours of sleep, or as recommended by your caregiver.  Avoid excessive use of pain medicine as recurrent headaches can occur.  SEEK MEDICAL CARE IF:   You have problems with the medicines you were prescribed.  Your medicines do not work.  You have a change from the usual headache.  You have nausea or vomiting. SEEK IMMEDIATE MEDICAL CARE IF:   Your headache becomes severe.  You have a fever.  You have a stiff neck.  You have loss of vision.  You have muscular weakness or loss of muscle control.  You lose your balance or have trouble walking.  You feel faint or pass out.  You have severe symptoms that are different from your first symptoms. MAKE SURE YOU:   Understand these instructions.  Will watch your condition.  Will get help right away if you are not doing well or get worse. Document Released: 02/07/2005 Document Revised: 05/02/2011 Document Reviewed: 01/28/2011 [facility] Patient Information 2014 [place].  Nosebleed Nosebleeds can be caused by many conditions including trauma, infections, polyps, foreign bodies, dry mucous membranes or climate, medications and air conditioning. Most nosebleeds occur in the front of the nose. It is  because of this location that most nosebleeds can be controlled by pinching the nostrils gently and continuously. Do this for at least 10 to 20 minutes. The reason for this long continuous pressure is that you must hold it long enough for the blood to clot. If during that 10 to 20 minute time period, pressure is released, the process may have to be started again. The nosebleed may stop by itself, quit with pressure, need concentrated heating (cautery) or stop with pressure from packing. HOME CARE INSTRUCTIONS   If your nose  was packed, try to maintain the pack inside until your caregiver removes it. If a gauze pack was used and it starts to fall out, gently replace or cut the end off. Do not cut if a balloon catheter was used to pack the nose. Otherwise, do not remove unless instructed.  Avoid blowing your nose for 12 hours after treatment. This could dislodge the pack or clot and start bleeding again.  If the bleeding starts again, sit up and bending forward, gently pinch the front half of your nose continuously for 20 minutes.  If bleeding was caused by dry mucous membranes, cover the inside of your nose every morning with a petroleum or antibiotic ointment. Use your little fingertip as an applicator. Do this as needed during dry weather. This will keep the mucous membranes moist and allow them to heal.  Maintain humidity in your home by using less air conditioning or using a humidifier.  Do not use aspirin or medications which make bleeding more likely. Your caregiver can give you recommendations on this.  Resume normal activities as able but try to avoid straining, lifting or bending at the waist for several days.  If the nosebleeds become recurrent and the cause is unknown, your caregiver may suggest laboratory tests. SEEK IMMEDIATE MEDICAL CARE IF:   Bleeding recurs and cannot be controlled.  There is unusual bleeding from or bruising on other parts of the body.  You have a fever.  Nosebleeds continue.  There is any worsening of the condition which originally brought you in.  You become lightheaded, feel faint, become sweaty or vomit blood. MAKE SURE YOU:   Understand these instructions.  Will watch your condition.  Will get help right away if you are not doing well or get worse. Document Released: 11/17/2004 Document Revised: 05/02/2011 Document Reviewed: 01/09/2009 [facility] Patient Information 2014 [place], [place].  Cyclobenzaprine tablets What is this medicine? CYCLOBENZAPRINE (sye kloe  BEN za preen) is a muscle relaxer. It is used to treat muscle pain, spasms, and stiffness. This medicine may be used for other purposes; ask your health care provider or pharmacist if you have questions. COMMON BRAND NAME(S): Fexmid, Flexeril What should I tell my health care provider before I take this medicine? They need to know if you have any of these conditions: -heart disease, irregular heartbeat, or previous heart attack -liver disease -thyroid problem -an unusual or allergic reaction to cyclobenzaprine, tricyclic antidepressants, lactose, other medicines, foods, dyes, or preservatives -pregnant or trying to get pregnant -breast-feeding How should I use this medicine? Take this medicine by mouth with a glass of water. Follow the directions on the prescription label. If this medicine upsets your stomach, take it with food or milk. Take your medicine at regular intervals. Do not take it more often than directed. Talk to your pediatrician regarding the use of this medicine in children. Special care may be needed. Overdosage: If you think you have taken too much of this  medicine contact a poison control center or emergency room at once. NOTE: This medicine is only for you. Do not share this medicine with others. What if I miss a dose? If you miss a dose, take it as soon as you can. If it is almost time for your next dose, take only that dose. Do not take double or extra doses. What may interact with this medicine? Do not take this medicine with any of the following medications: -certain medicines for fungal infections like fluconazole, itraconazole, ketoconazole, posaconazole, voriconazole -cisapride -dofetilide -dronedarone -droperidol -flecainide -grepafloxacin -halofantrine -levomethadyl -MAOIs like Carbex, Eldepryl, Marplan, Nardil, and Parnate -nilotinib -pimozide -probucol -sertindole -thioridazine -ziprasidone  This medicine may also interact with the following  medications: -abarelix -alcohol -certain medicines for cancer -certain medicines for depression, anxiety, or psychotic disturbances -certain medicines for infection like alfuzosin, chloroquine, clarithromycin, levofloxacin, mefloquine, pentamidine, troleandomycin -certain medicines for an irregular heart beat -certain medicines used for sleep or numbness during surgery or procedure -contrast dyes -dolasetron -guanethidine -methadone -octreotide -ondansetron -other medicines that prolong the QT interval (cause an abnormal heart rhythm) -palonosetron -phenothiazines like chlorpromazine, mesoridazine, prochlorperazine, thioridazine -tramadol -vardenafil This list may not describe all possible interactions. Give your health care provider a list of all the medicines, herbs, non-prescription drugs, or dietary supplements you use. Also tell them if you smoke, drink alcohol, or use illegal drugs. Some items may interact with your medicine. What should I watch for while using this medicine? Check with your doctor or health care professional if your condition does not improve within 1 to 3 weeks. You may get drowsy or dizzy when you first start taking the medicine or change doses. Do not drive, use machinery, or do anything that may be dangerous until you know how the medicine affects you. Stand or sit up slowly. Your mouth may get dry. Drinking water, chewing sugarless gum, or sucking on hard candy may help. What side effects may I notice from receiving this medicine? Side effects that you should report to your doctor or health care professional as soon as possible: -allergic reactions like skin rash, itching or hives, swelling of the face, lips, or tongue -chest pain -fast heartbeat -hallucinations -seizures -vomiting Side effects that usually do not require medical attention (report to your doctor or health care professional if they continue or are bothersome): -headache This list may not  describe all possible side effects. Call your doctor for medical advice about side effects. You may report side effects to FDA at 1-800-FDA-1088. Where should I keep my medicine? Keep out of the reach of children. Store at room temperature between 15 and 30 degrees C (59 and 86 degrees F). Keep container tightly closed. Throw away any unused medicine after the expiration date. NOTE: This sheet is a summary. It may not cover all possible information. If you have questions about this medicine, talk to your doctor, pharmacist, or health care provider.  2014, [employer]. (2012-09-04 12:48:19)

## 2013-04-19 NOTE — ED Notes (Signed)
Pt states she has a lot of stress right now, has been having intermittent right side headaches, dried blood in both nostrils, and when she checked her manual blood pressure at home tonight, it was 200/140.  Pt denies pain at this time

## 2013-04-25 ENCOUNTER — Ambulatory Visit: Payer: BC Managed Care – PPO | Admitting: Obstetrics & Gynecology

## 2013-04-29 ENCOUNTER — Ambulatory Visit: Payer: BC Managed Care – PPO | Admitting: Obstetrics & Gynecology

## 2013-06-06 ENCOUNTER — Ambulatory Visit: Payer: BC Managed Care – PPO | Admitting: Obstetrics & Gynecology

## 2013-07-08 ENCOUNTER — Ambulatory Visit: Payer: BC Managed Care – PPO | Admitting: Obstetrics & Gynecology

## 2013-07-26 ENCOUNTER — Encounter (HOSPITAL_COMMUNITY): Payer: Self-pay | Admitting: Emergency Medicine

## 2013-07-26 ENCOUNTER — Emergency Department (HOSPITAL_COMMUNITY)
Admission: EM | Admit: 2013-07-26 | Discharge: 2013-07-26 | Disposition: A | Discharge status: Home / Self Care | Payer: BC Managed Care – PPO | Attending: Emergency Medicine | Admitting: Emergency Medicine

## 2013-07-26 DIAGNOSIS — W57XXXA Bitten or stung by nonvenomous insect and other nonvenomous arthropods, initial encounter: Secondary | ICD-10-CM

## 2013-07-26 DIAGNOSIS — Y929 Unspecified place or not applicable: Secondary | ICD-10-CM | POA: Insufficient documentation

## 2013-07-26 DIAGNOSIS — G8929 Other chronic pain: Secondary | ICD-10-CM | POA: Insufficient documentation

## 2013-07-26 DIAGNOSIS — IMO0002 Reserved for concepts with insufficient information to code with codable children: Secondary | ICD-10-CM | POA: Insufficient documentation

## 2013-07-26 DIAGNOSIS — F411 Generalized anxiety disorder: Secondary | ICD-10-CM | POA: Insufficient documentation

## 2013-07-26 DIAGNOSIS — Z91040 Latex allergy status: Secondary | ICD-10-CM | POA: Insufficient documentation

## 2013-07-26 DIAGNOSIS — Z87891 Personal history of nicotine dependence: Secondary | ICD-10-CM | POA: Insufficient documentation

## 2013-07-26 DIAGNOSIS — Y9389 Activity, other specified: Secondary | ICD-10-CM | POA: Insufficient documentation

## 2013-07-26 DIAGNOSIS — S90569A Insect bite (nonvenomous), unspecified ankle, initial encounter: Secondary | ICD-10-CM | POA: Insufficient documentation

## 2013-07-26 DIAGNOSIS — Z79899 Other long term (current) drug therapy: Secondary | ICD-10-CM | POA: Insufficient documentation

## 2013-07-26 MED ORDER — HYDROXYZINE HCL 25 MG PO TABS: 25.0000 mg | ORAL_TABLET | Freq: Four times a day (QID) | ORAL | Status: DC

## 2013-07-26 MED ORDER — HYDROXYZINE HCL 25 MG PO TABS
100.0000 mg | ORAL_TABLET | Freq: Once | ORAL | Status: AC
  Administered 2013-07-26: 100 mg via ORAL
  Filled 2013-07-26: qty 4

## 2013-07-26 MED ORDER — HYDROCORTISONE 1 % EX OINT: 1.0000 "application " | TOPICAL_OINTMENT | Freq: Two times a day (BID) | CUTANEOUS | Status: DC

## 2013-07-26 NOTE — ED Notes (Signed)
Pt alert & oriented x4, stable gait. Patient given discharge instructions, paperwork & prescription(s). Patient  instructed to stop at the registration desk to finish any additional paperwork. Patient verbalized understanding. Pt left department w/ no further questions. 

## 2013-07-26 NOTE — ED Notes (Signed)
Pt states she has a rash on the back of her legs x 2 days.

## 2013-07-26 NOTE — ED Provider Notes (Signed)
CSN: [id number]     Arrival date & time 07/26/13  0109 History   First MD Initiated Contact with Patient 07/26/13 0118     No chief complaint on file.    (Consider location/radiation/quality/duration/timing/severity/associated sxs/prior Treatment) HPI Comments: Patient presents to the ER for evaluation of rash. Patient reports that she has itchy rash on the backs of her legs. She first noticed it 2 days ago. The areas are red and swollen, very itchy and are also tender to touch. She is unaware of any bites or stings. There has not been any drainage. She has not used any new skin products or taking no medications. She also has noticed 2 small itchy bumps on her forearms. No tongue or throat swelling.   Past Medical History  Diagnosis Date  . Anxiety   . Edema   . Chronic back pain    History reviewed. No pertinent past surgical history. Family History  Problem Relation Age of Onset  . Diabetes Mother    History  Substance Use Topics  . Smoking status: Former Smoker    Quit date: 09/16/2007  . Smokeless tobacco: Never Used  . Alcohol Use: No   OB History   Grav Para Term Preterm Abortions TAB SAB Ect Mult Living   1    1   1   0     Review of Systems  HENT: Negative.   Skin: Positive for rash.  All other systems reviewed and are negative.     Allergies  Latex  Home Medications   Prior to Admission medications   Medication Sig Start Date End Date Taking? Authorizing Provider  cephALEXin (KEFLEX) 500 MG capsule Take 1 capsule (500 mg total) by mouth 4 (four) times daily. 01/15/13   Laray AngerKathleen M McManus, DO  cyclobenzaprine (FLEXERIL) 10 MG tablet Take 1 tablet by mouth 3 (three) times daily as needed for muscle spasms.  01/09/13   Historical Provider, MD  cyclobenzaprine (FLEXERIL) 10 MG tablet Take 1 tablet (10 mg total) by mouth 2 (two) times daily as needed for muscle spasms. 04/19/13   Dione Boozeavid Glick, MD  ferrous sulfate 325 (65 FE) MG tablet Take 325 mg by mouth every  morning.    Historical Provider, MD  fluticasone (FLONASE) 50 MCG/ACT nasal spray Place 2 sprays into the nose daily.    Historical Provider, MD  furosemide (LASIX) 20 MG tablet Take 20 mg by mouth daily.    Historical Provider, MD  HYDROcodone-acetaminophen (NORCO) 5-325 MG per tablet Take 1 tablet by mouth every 4 (four) hours as needed for moderate pain. 01/07/13   Kathie DikeHobson M Bryant, PA-C   There were no vitals taken for this visit. Physical Exam  Constitutional: She is oriented to person, place, and time. She appears well-developed and well-nourished. No distress.  HENT:  Head: Normocephalic and atraumatic.  Right Ear: Hearing normal.  Left Ear: Hearing normal.  Nose: Nose normal.  Mouth/Throat: Oropharynx is clear and moist and mucous membranes are normal.  Eyes: Conjunctivae and EOM are normal. Pupils are equal, round, and reactive to light.  Neck: Normal range of motion. Neck supple.  Cardiovascular: Regular rhythm, S1 normal and S2 normal.  Exam reveals no gallop and no friction rub.   No murmur heard. Pulmonary/Chest: Effort normal and breath sounds normal. No respiratory distress. She exhibits no tenderness.  Abdominal: Soft. Normal appearance and bowel sounds are normal. There is no hepatosplenomegaly. There is no tenderness. There is no rebound, no guarding, no tenderness at McBurney's point  and negative Murphy's sign. No hernia.  Musculoskeletal: Normal range of motion.  Neurological: She is alert and oriented to person, place, and time. She has normal strength. No cranial nerve deficit or sensory deficit. Coordination normal. GCS eye subscore is 4. GCS verbal subscore is 5. GCS motor subscore is 6.  Skin: Skin is warm, dry and intact. No rash noted. No cyanosis.     Psychiatric: She has a normal mood and affect. Her speech is normal and behavior is normal. Thought content normal.    ED Course  Procedures (including critical care time) Labs Review Labs Reviewed - No data to  display  Imaging Review No results found.   EKG Interpretation None      MDM   Final diagnoses:  None   Patient with lesions on the backs of bilateral lower legs likely insect bites with inflammatory response. This does not look like infection. Patient reassured probable treated with hydroxyzine for itch and topical cortisone.    Gilda Crease, MD 07/26/13 [phone]

## 2013-07-26 NOTE — Discharge Instructions (Signed)
Insect Bite  Mosquitoes, flies, fleas, bedbugs, and many other insects can bite. Insect bites are different from insect stings. A sting is when venom is injected into the skin. Some insect bites can transmit infectious diseases.  SYMPTOMS   Insect bites usually turn red, swell, and itch for 2 to 4 days. They often go away on their own.  TREATMENT   Your caregiver may prescribe antibiotic medicines if a bacterial infection develops in the bite.  HOME CARE INSTRUCTIONS   Do not scratch the bite area.   Keep the bite area clean and dry. Wash the bite area thoroughly with soap and water.   Put ice or cool compresses on the bite area.   Put ice in a plastic bag.   Place a towel between your skin and the bag.   Leave the ice on for 20 minutes, 4 times a day for the first 2 to 3 days, or as directed.   You may apply a baking soda paste, cortisone cream, or calamine lotion to the bite area as directed by your caregiver. This can help reduce itching and swelling.   Only take over-the-counter or prescription medicines as directed by your caregiver.   If you are given antibiotics, take them as directed. Finish them even if you start to feel better.  You may need a tetanus shot if:   You cannot remember when you had your last tetanus shot.   You have never had a tetanus shot.   The injury broke your skin.  If you get a tetanus shot, your arm may swell, get red, and feel warm to the touch. This is common and not a problem. If you need a tetanus shot and you choose not to have one, there is a rare chance of getting tetanus. Sickness from tetanus can be serious.  SEEK IMMEDIATE MEDICAL CARE IF:    You have increased pain, redness, or swelling in the bite area.   You see a red line on the skin coming from the bite.   You have a fever.   You have joint pain.   You have a headache or neck pain.   You have unusual weakness.   You have a rash.   You have chest pain or shortness of breath.    You have abdominal pain, nausea, or vomiting.   You feel unusually tired or sleepy.  MAKE SURE YOU:    Understand these instructions.   Will watch your condition.   Will get help right away if you are not doing well or get worse.  Document Released: 03/17/2004 Document Revised: 05/02/2011 Document Reviewed: 09/08/2010  [employer] Patient Information 2014 [employer]

## 2013-08-14 ENCOUNTER — Ambulatory Visit: Payer: BC Managed Care – PPO | Admitting: Obstetrics & Gynecology

## 2013-08-16 ENCOUNTER — Ambulatory Visit (INDEPENDENT_AMBULATORY_CARE_PROVIDER_SITE_OTHER): Payer: BC Managed Care – PPO | Admitting: Obstetrics & Gynecology

## 2013-08-16 ENCOUNTER — Encounter: Payer: Self-pay | Admitting: Obstetrics & Gynecology

## 2013-08-16 VITALS — BP 123/77 | HR 85 | Temp 98.4°F | Ht 64.0 in | Wt 385.0 lb

## 2013-08-16 DIAGNOSIS — E669 Obesity, unspecified: Secondary | ICD-10-CM

## 2013-08-16 DIAGNOSIS — Z01419 Encounter for gynecological examination (general) (routine) without abnormal findings: Secondary | ICD-10-CM

## 2013-08-16 DIAGNOSIS — N926 Irregular menstruation, unspecified: Secondary | ICD-10-CM

## 2013-08-16 LAB — POCT URINE PREGNANCY: PREG TEST UR: NEGATIVE

## 2013-08-16 NOTE — Progress Notes (Signed)
Subjective:     Brooke [name] is a 28 y.o. female here for a routine exam.     Personal health questionnaire:  Is patient Ashkenazi Jewish, have a family history of breast and/or ovarian cancer: no Is there a family history of uterine cancer diagnosed at age < 1550, gastrointestinal cancer, urinary tract cancer, family member who is a Personnel officerLynch syndrome-associated carrier: no Is the patient overweight and hypertensive, family history of diabetes, personal history of gestational diabetes or PCOS: yes Is patient over 5755, have PCOS,  family history of premature CHD under age 26, diabetes, smoke, have hypertension or peripheral artery disease:  no   Gynecologic History Patient's last menstrual period was 08/01/2013. Contraception: none Last Pap: unknown Last mammogram: n/a.   Obstetric History OB History  Gravida Para Term Preterm AB SAB TAB Ectopic Multiple Living  2    2 1  1   0    # Outcome Date GA Lbr Len/2nd Weight Sex Delivery Anes PTL Lv  2 SAB 2013          1 ECT 2009              Past Medical History  Diagnosis Date  . Anxiety   . Edema   . Chronic back pain     History reviewed. No pertinent past surgical history.  Current outpatient prescriptions:cyclobenzaprine (FLEXERIL) 10 MG tablet, Take 1 tablet (10 mg total) by mouth 2 (two) times daily as needed for muscle spasms., Disp: 20 tablet, Rfl: 0;  ferrous sulfate 325 (65 FE) MG tablet, Take 325 mg by mouth every morning., Disp: , Rfl: ;  fluticasone (FLONASE) 50 MCG/ACT nasal spray, Place 2 sprays into the nose daily., Disp: , Rfl: ;  furosemide (LASIX) 20 MG tablet, Take 20 mg by mouth daily., Disp: , Rfl:  hydrocortisone 1 % ointment, Apply 1 application topically 2 (two) times daily., Disp: 30 g, Rfl: 0;  hydrOXYzine (ATARAX/VISTARIL) 25 MG tablet, Take 1 tablet (25 mg total) by mouth every 6 (six) hours., Disp: 30 tablet, Rfl: 0;  cephALEXin (KEFLEX) 500 MG capsule, Take 1 capsule (500 mg total) by mouth 4 (four) times  daily., Disp: 40 capsule, Rfl: 0 cyclobenzaprine (FLEXERIL) 10 MG tablet, Take 1 tablet by mouth 3 (three) times daily as needed for muscle spasms. , Disp: , Rfl: ;  HYDROcodone-acetaminophen (NORCO) 5-325 MG per tablet, Take 1 tablet by mouth every 4 (four) hours as needed for moderate pain., Disp: 15 tablet, Rfl: 0 Allergies  Allergen Reactions  . Latex Hives    History  Substance Use Topics  . Smoking status: Former Smoker    Quit date: 09/16/2007  . Smokeless tobacco: Never Used  . Alcohol Use: No    Family History  Problem Relation Age of Onset  . Diabetes Mother       Review of Systems  Constitutional: negative for fatigue and weight loss Respiratory: negative for cough and wheezing Cardiovascular: negative for chest pain, fatigue and palpitations Gastrointestinal: negative for abdominal pain and change in bowel habits Musculoskeletal:negative for myalgias Neurological: negative for gait problems and tremors Behavioral/Psych: negative for abusive relationship, depression Endocrine: negative for temperature intolerance   Genitourinary:negative for abnormal menstrual periods, genital lesions, hot flashes, sexual problems and vaginal discharge Integument/breast: negative for breast lump, breast tenderness, nipple discharge and skin lesion(s)    Objective:       BP 123/77  Pulse 85  Temp(Src) 98.4 F (36.9 C)  Ht 5\' 4"  (1.626 m)  Wt  174.635 kg (385 lb)  BMI 66.05 kg/m2  LMP 08/01/2013 General:   alert  Skin:   no rash or abnormalities  Lungs:   clear to auscultation bilaterally  Heart:   regular rate and rhythm, S1, S2 normal, no murmur, click, rub or gallop  Breasts:   normal without suspicious masses, skin or nipple changes or axillary nodes  Abdomen:  normal findings: no organomegaly, soft, non-tender and no hernia  Pelvis:  External genitalia: normal general appearance Urinary system: urethral meatus normal and bladder without fullness, nontender Vaginal:  normal without tenderness, induration or masses Cervix: normal appearance Adnexa: exam limited by body habitus Uterus: exam limited by body habitus   Lab Review Urine pregnancy test Labs reviewed no Radiologic studies reviewed no     Assessment:    Healthy female exam.    Plan:   Counseled re: HPV vaccine--catch up, weight loss; f/u with primary care provider re: possible pharmacological management of the obesity Consider possible bariatric surgery  Orders Placed This Encounter  Procedures  . Referral to Nutrition and Diabetes Services    Referral Priority:  Routine    Referral Type:  Consultation    Referral Reason:  Specialty Services Required    Number of Visits Requested:  1  . POCT urine pregnancy    Follow up as needed.

## 2013-08-18 NOTE — Patient Instructions (Signed)
Bariatric Surgery Information Severe obesity is difficult to treat through diet and exercise alone. Bariatric surgery (also called weight loss surgery) is an option for people who are severely obese and cannot lose weight by traditional means, or who suffer from serious obesity-related health problems. The surgery promotes weight loss by decreasing the absorption of food and, in some cases, by interrupting the digestive process. As in other treatments for obesity, best results are achieved with healthy eating behaviors and regular physical activity.  People who may consider bariatric surgery include those with a body mass index (BMI) above 40. Men with a BMI of 40 are about 100 lb (45 kg) overweight and women with this BMI are about 80 lb (36 kg) overweight. People with a BMI between 35 and 40 and who suffer from type 2 diabetes or life-threatening heart and lung (cardiopulmonary) problems, such as severe sleep apnea or obesity-related heart disease, may also be candidates for surgery.  THE NORMAL DIGESTIVE PROCESS Normally, as food moves along the digestive tract, digestive juices and enzymes digest and absorb calories and nutrients. After we chew and swallow our food, it moves down the esophagus to the stomach. There a strong acid continues the digestive process. When the stomach contents move to the first portion of the small intestine (duodenum), bile and pancreatic juice speed up digestion. The jejunum and ileum are the remaining two segments of the small intestine. They complete the absorption of almost all calories and nutrients. The food particles that cannot be digested in the small intestine are stored in the large intestine until they are eliminated.  HOW DOES SURGERY PROMOTE WEIGHT LOSS? Bariatric surgery alters the digestive process. The surgery closes off parts of the stomach to make it smaller, restricting the amount of food the stomach can hold. There are two types of bariatric surgeries:  restrictive surgeries and malabsorptive surgeries. Restrictive surgeries only reduce stomach size. They do not interfere with the normal digestive process. Malabsorptive surgeries combine stomach restriction with a partial bypass of the small intestine. These types of procedures create a direct connection from the stomach to the lower segment of the small intestine. The connection causes food to bypass the portions of the digestive tract that absorb calories and nutrients.Malabsorptive surgeries are the most common surgeries for weight loss. They restrict both food intake and the amount of calories and nutrients the body absorbs.  Restrictive surgeries lead to weight loss in almost all patients. But they are less successful than malabsorptive surgeries in achieving substantial, long-term weight loss. Some patients regain weight. Others are unable to adjust their eating habits and fail to lose the desired weight. Successful results depend on the patient's willingness to adopt a long-term plan of healthy eating and regular physical activity.  RESTRICTIVE SURGERY To perform a restrictive surgery, health care providers create a small pouch at the top of the stomach where food enters from the esophagus. At first, the pouch holds about 1 oz (28 g) of food. It later expands to hold 2-3 oz (56-84 g). The lower outlet of the pouch usually has a diameter of about  inch (1.9 cm). This small outlet delays the emptying of food from the pouch and causes a feeling of fullness. As a result of this surgery, most people lose the ability to eat large amounts of food at one time. After the surgery, the person usually can eat only  to 1 c (about 2 L) of food without discomfort or nausea. Also, food has to be well  chewed.  There are several types of procedures that create this pouch. Adjustable Gastric Banding In this procedure, a hollow band is placed around the stomach near its upper end. This creates a small pouch and a  narrow passage into the larger remainder of the stomach. The band is then inflated with a salt solution. It can be tightened or loosened over time to change the size of the passage by increasing or decreasing the amount of salt solution.  The band is adjusted based on feelings of hunger and weight loss. Patients decide when they need an adjustment and come to their surgeons to be evaluated. The adjustment is done as an office visit. The band is fully reversible with a second surgery if the patient changes his or her mind. There is no cutting or rerouting of the intestine.  Vertical Banded Gastroplasty This is the most common restrictive surgery for weight control. Both a band and staples are used to create a small stomach pouch. Vertical banded gastroplasty is based on the same principle of restriction as adjustable gastric banding, but the stomach is surgically altered with the stapling. This treatment is not reversible.  About 30% of those who undergo vertical banded gastroplasty achieve normal weight. About 80% achieve some degree of weight loss. MALABSORPTIVE SURGERY Malabsorptive surgeries produce more weight loss than restrictive surgeries. And they are more effective in reversing the health problems associated with severe obesity. Patients who have malabsorptive surgeries generally lose two-thirds of their excess weight within 2 years. There are several types of malabsorptive surgeries. Each one carries its own benefits and risks.  Roux-en-Y Gastric Bypass (RGB) This surgery is the most common and successful malabsorptive surgery. First, a small stomach pouch is created to restrict food intake. Next, a y-shaped section of the small intestine is attached to the pouch. This allows food to bypass the lower stomach, the duodenum, and the first portion of jejunum. This reduces the amount of calories and nutrients the body absorbs.  Biliopancreatic diversion (BPD) In this more complicated malabsorptive  surgery, portions of the stomach are removed. The small pouch that remains is connected directly to the final segment of the small intestine, completely bypassing the duodenum and the jejunum.  This procedure successfully promotes weight loss. But it is less frequently used than other types of surgery because of the high risk for nutritional deficiencies. A variation of this procedure includes a "duodenal switch." This leaves a larger portion of the stomach intact, including the valve that regulates the release of stomach contents into the small intestine (pyloric valve). It also keeps a small part of the duodenum in the digestive pathway.  WHAT ARE THE BENEFITS AND RISKS OF BARIATRIC SURGERY? General Benefits  Right after surgery, most patients lose weight quickly. They continue to lose weight for 18-24 months after the procedure. Most patients regain 5-10% of the weight they lost, but many maintain a long-term weight loss of about 100 lb (45 kg).   Most obesity-related conditions, such as abnormal blood sugar levels, improve after the surgery.  General Risks  Infection.  Abdominal hernias.   Breakdown of the staple line.   Stretched stomach outlets.  Development of gallstones. These are clumps of cholesterol and other matter that form in the gallbladder. During quick or substantial weight loss, one's risk of developing gallstones increases.   Nutritional deficiencies. Nearly 30% of patients who have bariatric surgery develop nutritional deficiencies. These include anemia, osteoporosis, and metabolic bone disease.  A leak from any of  the surgical connections (anastomoses). This is life-threatening. The more involved the surgery, the more risk involved.   Inability to lose weight or weight gain. Patients who do not follow a strict diet will stretch out their stomach pouches and they will not lose weight.   Dumping syndrome. This occurs when stomach contents move too rapidly through  the small intestine causing cramping, diarrhea, nausea, palpitations, sweating, bloating, and dizziness or fainting.  Specific Risks of Restrictive Surgeries  Vomiting. This occurs when the small stomach is overly stretched by food particles that have not been chewed well.   Band slippage and saline leakage. This may occur after adjustable gastric banding.   Band erosion into the lumen of the stomach.   Wearing away of the band and breakdown of the staple line. This may occur after vertical banded gastroplasty. In a small number of cases, stomach juices may leak into the abdomen. If this happens, an emergency surgery is needed.   Death from complications (rare). This happens in only about 1% of cases.   Stomach prolapse. Specific Risks of Malabsorptive Surgeries In addition to the risks of restrictive surgeries, malabsorptive surgeries also carry greater risk for nutritional deficiencies. This is because the procedure causes food to bypass the duodenum and jejunum. That is where most iron and calcium are absorbed. The more extensive the bypass, the greater the risk is for complications and nutritional deficiencies, including:    Anemia.  Osteoporosis.   Metabolic bone disease. Follow-up surgeries to correct complications are needed in about 10-20% of patients.  FOR MORE INFORMATION American Society for Metabolic & Bariatric Surgery: www.asmbs.org  Weight-control Information Network (WIN): win.AmenCredit.is Document Released: 02/07/2005 Document Revised: 02/12/2013 Document Reviewed: 08/07/2012 [facility] Patient Information 2015 [place], [place]. This information is not intended to replace advice given to you by your health care Marbella Markgraf. Make sure you discuss any questions you have with your health care Yug Loria. Exercise to Lose Weight Exercise and a healthy diet may help you lose weight. Your doctor may suggest specific exercises. EXERCISE IDEAS AND TIPS  Choose low-cost  things you enjoy doing, such as walking, bicycling, or exercising to workout videos.  Take stairs instead of the elevator.  Walk during your lunch break.  Park your car further away from work or school.  Go to a gym or an exercise class.  Start with 5 to 10 minutes of exercise each day. Build up to 30 minutes of exercise 4 to 6 days a week.  Wear shoes with good support and comfortable clothes.  Stretch before and after working out.  Work out until you breathe harder and your heart beats faster.  Drink extra water when you exercise.  Do not do so much that you hurt yourself, feel dizzy, or get very short of breath. Exercises that burn about 150 calories:  Running 1  miles in 15 minutes.  Playing volleyball for 45 to 60 minutes.  Washing and waxing a car for 45 to 60 minutes.  Playing touch football for 45 minutes.  Walking 1  miles in 35 minutes.  Pushing a stroller 1  miles in 30 minutes.  Playing basketball for 30 minutes.  Raking leaves for 30 minutes.  Bicycling 5 miles in 30 minutes.  Walking 2 miles in 30 minutes.  Dancing for 30 minutes.  Shoveling snow for 15 minutes.  Swimming laps for 20 minutes.  Walking up stairs for 15 minutes.  Bicycling 4 miles in 15 minutes.  Gardening for 30 to 45 minutes.  Jumping rope for 15 minutes.  Washing windows or floors for 45 to 60 minutes. Document Released: 03/12/2010 Document Revised: 05/02/2011 Document Reviewed: 03/12/2010 [facility] Patient Information 2015 [place], [place]. This information is not intended to replace advice given to you by your health care Ardene Remley. Make sure you discuss any questions you have with your health care Shamond Skelton. Calorie Counting for Weight Loss Calories are energy you get from the things you eat and drink. Your body uses this energy to keep you going throughout the day. The number of calories you eat affects your weight. When you eat more calories than your body needs, your  body stores the extra calories as fat. When you eat fewer calories than your body needs, your body burns fat to get the energy it needs. Calorie counting means keeping track of how many calories you eat and drink each day. If you make sure to eat fewer calories than your body needs, you should lose weight. In order for calorie counting to work, you will need to eat the number of calories that are right for you in a day to lose a healthy amount of weight per week. A healthy amount of weight to lose per week is usually 1-2 lb (0.5-0.9 kg). A dietitian can determine how many calories you need in a day and give you suggestions on how to reach your calorie goal.  WHAT IS MY MY PLAN? My goal is to have __________ calories per day.  If I have this many calories per day, I should lose around __________ pounds per week. WHAT DO I NEED TO KNOW ABOUT CALORIE COUNTING? In order to meet your daily calorie goal, you will need to:  Find out how many calories are in each food you would like to eat. Try to do this before you eat.  Decide how much of the food you can eat.  Write down what you ate and how many calories it had. Doing this is called keeping a food log. WHERE DO I FIND CALORIE INFORMATION? The number of calories in a food can be found on a Nutrition Facts label. Note that all the information on a label is based on a specific serving of the food. If a food does not have a Nutrition Facts label, try to look up the calories online or ask your dietitian for help. HOW DO I DECIDE HOW MUCH TO EAT? To decide how much of the food you can eat, you will need to consider both the number of calories in one serving and the size of one serving. This information can be found on the Nutrition Facts label. If a food does not have a Nutrition Facts label, look up the information online or ask your dietitian for help. Remember that calories are listed per serving. If you choose to have more than one serving of a food, you  will have to multiply the calories per serving by the amount of servings you plan to eat. For example, the label on a package of bread might say that a serving size is 1 slice and that there are 90 calories in a serving. If you eat 1 slice, you will have eaten 90 calories. If you eat 2 slices, you will have eaten 180 calories. HOW DO I KEEP A FOOD LOG? After each meal, record the following information in your food log:  What you ate.  How much of it you ate.  How many calories it had.  Then, add up your calories. Keep your  food log near you, such as in a small notebook in your pocket. Another option is to use a mobile app or website. Some programs will calculate calories for you and show you how many calories you have left each time you add an item to the log. WHAT ARE SOME CALORIE COUNTING TIPS?  Use your calories on foods and drinks that will fill you up and not leave you hungry. Some examples of this include foods like nuts and nut butters, vegetables, lean proteins, and high-fiber foods (more than 5 g fiber per serving).  Eat nutritious foods and avoid empty calories. Empty calories are calories you get from foods or beverages that do not have many nutrients, such as candy and soda. It is better to have a nutritious high-calorie food (such as an avocado) than a food with few nutrients (such as a bag of chips).  Know how many calories are in the foods you eat most often. This way, you do not have to look up how many calories they have each time you eat them.  Look out for foods that may seem like low-calorie foods but are really high-calorie foods, such as baked goods, soda, and fat-free candy.  Pay attention to calories in drinks. Drinks such as sodas, specialty coffee drinks, alcohol, and juices have a lot of calories yet do not fill you up. Choose low-calorie drinks like water and diet drinks.  Focus your calorie counting efforts on higher calorie items. Logging the calories in a garden  salad that contains only vegetables is less important than calculating the calories in a milk shake.  Find a way of tracking calories that works for you. Get creative. Most people who are successful find ways to keep track of how much they eat in a day, even if they do not count every calorie. WHAT ARE SOME PORTION CONTROL TIPS?  Know how many calories are in a serving. This will help you know how many servings of a certain food you can have.  Use a measuring cup to measure serving sizes. This is helpful when you start out. With time, you will be able to estimate serving sizes for some foods.  Take some time to put servings of different foods on your favorite plates, bowls, and cups so you know what a serving looks like.  Try not to eat straight from a bag or box. Doing this can lead to overeating. Put the amount you would like to eat in a cup or on a plate to make sure you are eating the right portion.  Use smaller plates, glasses, and bowls to prevent overeating. This is a quick and easy way to practice portion control. If your plate is smaller, less food can fit on it.  Try not to multitask while eating, such as watching TV or using your computer. If it is time to eat, sit down at a table and enjoy your food. Doing this will help you to start recognizing when you are full. It will also make you more aware of what and how much you are eating. HOW CAN I CALORIE COUNT WHEN EATING OUT?  Ask for smaller portion sizes or child-sized portions.  Consider sharing an [place] and sides instead of getting your own [place].  If you get your own Lyndal Pulley, eat only half. Ask for a box at the beginning of your meal and put the rest of your entre in it so you are not tempted to eat it.  Look for the calories  on the menu. If calories are listed, choose the lower calorie options.  Choose dishes that include vegetables, fruits, whole grains, low-fat dairy products, and lean protein. Focusing on smart food  choices from each of the 5 food groups can help you stay on track at restaurants.  Choose items that are boiled, broiled, grilled, or steamed.  Choose water, milk, unsweetened iced tea, or other drinks without added sugars. If you want an alcoholic beverage, choose a lower calorie option. For example, a regular margarita can have up to 700 calories and a glass of wine has around 150.  Stay away from items that are buttered, battered, fried, or served with cream sauce. Items labeled "crispy" are usually fried, unless stated otherwise.  Ask for dressings, sauces, and syrups on the side. These are usually very high in calories, so do not eat much of them.  Watch out for salads. Many people think salads are a healthy option, but this is often not the case. Many salads come with bacon, fried chicken, lots of cheese, fried chips, and dressing. All of these items have a lot of calories. If you want a salad, choose a garden salad and ask for grilled meats or steak. Ask for the dressing on the side, or ask for olive oil and vinegar or lemon to use as dressing.  Estimate how many servings of a food you are given. For example, a serving of cooked rice is 1/2 cup or about the size of half a tennis ball or one cupcake wrapper. Knowing serving sizes will help you be aware of how much food you are eating at restaurants. The list below tells you how big or small some common portion sizes are based on everyday objects.  1 oz--4 stacked dice.  3 oz--1 deck of cards.  1 tsp--1 dice.  1 Tbsp--1/2 a Ping-Pong ball.  2 Tbsp--1 Ping-Pong ball.  1/2 cup--1 tennis ball or 1 cupcake wrapper.  1 cup--1 baseball. Document Released: 02/07/2005 Document Revised: 02/12/2013 Document Reviewed: 12/13/2012 [facility] Patient Information 2015 [place], [place]. This information is not intended to replace advice given to you by your health care Prince Couey. Make sure you discuss any questions you have with your health care  Ahmadou Bolz. Health Maintenance, Female A healthy lifestyle and preventative care can promote health and wellness.  Maintain regular health, dental, and eye exams.  Eat a healthy diet. Foods like vegetables, fruits, whole grains, low-fat dairy products, and lean protein foods contain the nutrients you need without too many calories. Decrease your intake of foods high in solid fats, added sugars, and salt. Get information about a proper diet from your caregiver, if necessary.  Regular physical exercise is one of the most important things you can do for your health. Most adults should get at least 150 minutes of moderate-intensity exercise (any activity that increases your heart rate and causes you to sweat) each week. In addition, most adults need muscle-strengthening exercises on 2 or more days a week.   Maintain a healthy weight. The body mass index (BMI) is a screening tool to identify possible weight problems. It provides an estimate of body fat based on height and weight. Your caregiver can help determine your BMI, and can help you achieve or maintain a healthy weight. For adults 20 years and older:  A BMI below 18.5 is considered underweight.  A BMI of 18.5 to 24.9 is normal.  A BMI of 25 to 29.9 is considered overweight.  A BMI of 30 and above is  considered obese.  Maintain normal blood lipids and cholesterol by exercising and minimizing your intake of saturated fat. Eat a balanced diet with plenty of fruits and vegetables. Blood tests for lipids and cholesterol should begin at age 61 and be repeated every 5 years. If your lipid or cholesterol levels are high, you are over 50, or you are a high risk for heart disease, you may need your cholesterol levels checked more frequently.Ongoing high lipid and cholesterol levels should be treated with medicines if diet and exercise are not effective.  If you smoke, find out from your caregiver how to quit. If you do not use tobacco, do not  start.  Lung cancer screening is recommended for adults aged 53-80 years who are at high risk for developing lung cancer because of a history of smoking. Yearly low-dose computed tomography (CT) is recommended for people who have at least a 30-pack-year history of smoking and are a current smoker or have quit within the past 15 years. A pack year of smoking is smoking an average of 1 pack of cigarettes a day for 1 year (for example: 1 pack a day for 30 years or 2 packs a day for 15 years). Yearly screening should continue until the smoker has stopped smoking for at least 15 years. Yearly screening should also be stopped for people who develop a health problem that would prevent them from having lung cancer treatment.  If you are pregnant, do not drink alcohol. If you are breastfeeding, be very cautious about drinking alcohol. If you are not pregnant and choose to drink alcohol, do not exceed 1 drink per day. One drink is considered to be 12 ounces (355 mL) of beer, 5 ounces (148 mL) of wine, or 1.5 ounces (44 mL) of liquor.  Avoid use of street drugs. Do not share needles with anyone. Ask for help if you need support or instructions about stopping the use of drugs.  High blood pressure causes heart disease and increases the risk of stroke. Blood pressure should be checked at least every 1 to 2 years. Ongoing high blood pressure should be treated with medicines, if weight loss and exercise are not effective.  If you are 20 to 26 years old, ask your caregiver if you should take aspirin to prevent strokes.  Diabetes screening involves taking a blood sample to check your fasting blood sugar level. This should be done once every 3 years, after age 6, if you are within normal weight and without risk factors for diabetes. Testing should be considered at a younger age or be carried out more frequently if you are overweight and have at least 1 risk factor for diabetes.  Breast cancer screening is essential  preventative care for women. You should practice "breast self-awareness." This means understanding the normal appearance and feel of your breasts and may include breast self-examination. Any changes detected, no matter how small, should be reported to a caregiver. Women in their 69s and 30s should have a clinical breast exam (CBE) by a caregiver as part of a regular health exam every 1 to 3 years. After age 22, women should have a CBE every year. Starting at age 85, women should consider having a mammogram (breast X-ray) every year. Women who have a family history of breast cancer should talk to their caregiver about genetic screening. Women at a high risk of breast cancer should talk to their caregiver about having an MRI and a mammogram every year.  Breast cancer gene (BRCA)-related  cancer risk assessment is recommended for women who have family members with BRCA-related cancers. BRCA-related cancers include breast, ovarian, tubal, and peritoneal cancers. Having family members with these cancers may be associated with an increased risk for harmful changes (mutations) in the breast cancer genes BRCA1 and BRCA2. Results of the assessment will determine the need for genetic counseling and BRCA1 and BRCA2 testing.  The Pap test is a screening test for cervical cancer. Women should have a Pap test starting at age 47. Between ages 72 and 70, Pap tests should be repeated every 2 years. Beginning at age 78, you should have a Pap test every 3 years as long as the past 3 Pap tests have been normal. If you had a hysterectomy for a problem that was not cancer or a condition that could lead to cancer, then you no longer need Pap tests. If you are between ages 82 and 67, and you have had normal Pap tests going back 10 years, you no longer need Pap tests. If you have had past treatment for cervical cancer or a condition that could lead to cancer, you need Pap tests and screening for cancer for at least 20 years after your  treatment. If Pap tests have been discontinued, risk factors (such as a new sexual partner) need to be reassessed to determine if screening should be resumed. Some women have medical problems that increase the chance of getting cervical cancer. In these cases, your caregiver may recommend more frequent screening and Pap tests.  The human papillomavirus (HPV) test is an additional test that may be used for cervical cancer screening. The HPV test looks for the virus that can cause the cell changes on the cervix. The cells collected during the Pap test can be tested for HPV. The HPV test could be used to screen women aged 23 years and older, and should be used in women of any age who have unclear Pap test results. After the age of 14, women should have HPV testing at the same frequency as a Pap test.  Colorectal cancer can be detected and often prevented. Most routine colorectal cancer screening begins at the age of 48 and continues through age 59. However, your caregiver may recommend screening at an earlier age if you have risk factors for colon cancer. On a yearly basis, your caregiver may provide home test kits to check for hidden blood in the stool. Use of a small camera at the end of a tube, to directly examine the colon (sigmoidoscopy or colonoscopy), can detect the earliest forms of colorectal cancer. Talk to your caregiver about this at age 79, when routine screening begins. Direct examination of the colon should be repeated every 5 to 10 years through age 81, unless early forms of pre-cancerous polyps or small growths are found.  Hepatitis C blood testing is recommended for all people born from [birth year] through 1963 and any individual with known risks for hepatitis C.  Practice safe sex. Use condoms and avoid high-risk sexual practices to reduce the spread of sexually transmitted infections (STIs). Sexually active women aged 70 and younger should be checked for Chlamydia, which is a common sexually  transmitted infection. Older women with new or multiple partners should also be tested for Chlamydia. Testing for other STIs is recommended if you are sexually active and at increased risk.  Osteoporosis is a disease in which the bones lose minerals and strength with aging. This can result in serious bone fractures. The risk of osteoporosis can  be identified using a bone density scan. Women ages 35 and over and women at risk for fractures or osteoporosis should discuss screening with their caregivers. Ask your caregiver whether you should be taking a calcium supplement or vitamin D to reduce the rate of osteoporosis.  Menopause can be associated with physical symptoms and risks. Hormone replacement therapy is available to decrease symptoms and risks. You should talk to your caregiver about whether hormone replacement therapy is right for you.  Use sunscreen. Apply sunscreen liberally and repeatedly throughout the day. You should seek shade when your shadow is shorter than you. Protect yourself by wearing long sleeves, pants, a wide-brimmed hat, and sunglasses year round, whenever you are outdoors.  Notify your caregiver of new moles or changes in moles, especially if there is a change in shape or color. Also notify your caregiver if a mole is larger than the size of a pencil eraser.  Stay current with your immunizations. Document Released: 08/23/2010 Document Revised: 06/04/2012 Document Reviewed: 01/09/2013 [facility] Patient Information 2015 [place], [place]. This information is not intended to replace advice given to you by your health care Zoey Gilkeson. Make sure you discuss any questions you have with your health care Yolander Goodie.

## 2013-08-19 LAB — PAP IG (IMAGE GUIDED)

## 2013-09-27 ENCOUNTER — Other Ambulatory Visit (HOSPITAL_COMMUNITY): Payer: Self-pay | Admitting: Emergency Medicine

## 2013-09-27 ENCOUNTER — Emergency Department (HOSPITAL_COMMUNITY): Payer: BC Managed Care – PPO

## 2013-09-27 ENCOUNTER — Emergency Department (HOSPITAL_COMMUNITY)
Admission: EM | Admit: 2013-09-27 | Discharge: 2013-09-27 | Disposition: A | Discharge status: Home / Self Care | Payer: BC Managed Care – PPO | Attending: Emergency Medicine | Admitting: Emergency Medicine

## 2013-09-27 ENCOUNTER — Ambulatory Visit (HOSPITAL_COMMUNITY)
Admit: 2013-09-27 | Discharge: 2013-09-27 | Disposition: A | Discharge status: Home / Self Care | Payer: BC Managed Care – PPO | Attending: Emergency Medicine | Admitting: Emergency Medicine

## 2013-09-27 ENCOUNTER — Encounter (HOSPITAL_COMMUNITY): Payer: Self-pay | Admitting: Emergency Medicine

## 2013-09-27 DIAGNOSIS — F411 Generalized anxiety disorder: Secondary | ICD-10-CM | POA: Insufficient documentation

## 2013-09-27 DIAGNOSIS — R1011 Right upper quadrant pain: Secondary | ICD-10-CM

## 2013-09-27 DIAGNOSIS — K8020 Calculus of gallbladder without cholecystitis without obstruction: Secondary | ICD-10-CM | POA: Insufficient documentation

## 2013-09-27 DIAGNOSIS — IMO0002 Reserved for concepts with insufficient information to code with codable children: Secondary | ICD-10-CM | POA: Insufficient documentation

## 2013-09-27 DIAGNOSIS — Z79899 Other long term (current) drug therapy: Secondary | ICD-10-CM | POA: Insufficient documentation

## 2013-09-27 DIAGNOSIS — E669 Obesity, unspecified: Secondary | ICD-10-CM | POA: Insufficient documentation

## 2013-09-27 DIAGNOSIS — Z91040 Latex allergy status: Secondary | ICD-10-CM | POA: Insufficient documentation

## 2013-09-27 DIAGNOSIS — Z3202 Encounter for pregnancy test, result negative: Secondary | ICD-10-CM | POA: Insufficient documentation

## 2013-09-27 DIAGNOSIS — Z87891 Personal history of nicotine dependence: Secondary | ICD-10-CM | POA: Insufficient documentation

## 2013-09-27 DIAGNOSIS — G8929 Other chronic pain: Secondary | ICD-10-CM | POA: Insufficient documentation

## 2013-09-27 DIAGNOSIS — Z792 Long term (current) use of antibiotics: Secondary | ICD-10-CM | POA: Insufficient documentation

## 2013-09-27 LAB — CBC WITH DIFFERENTIAL/PLATELET
Basophils Absolute: 0 10*3/uL (ref 0.0–0.1)
Basophils Relative: 0 % (ref 0–1)
EOS ABS: 0.1 10*3/uL (ref 0.0–0.7)
EOS PCT: 1 % (ref 0–5)
HEMATOCRIT: 34.4 % — AB (ref 36.0–46.0)
HEMOGLOBIN: 11.8 g/dL — AB (ref 12.0–15.0)
LYMPHS ABS: 2.1 10*3/uL (ref 0.7–4.0)
Lymphocytes Relative: 40 % (ref 12–46)
MCH: 27.5 pg (ref 26.0–34.0)
MCHC: 34.3 g/dL (ref 30.0–36.0)
MCV: 80.2 fL (ref 78.0–100.0)
MONO ABS: 0.2 10*3/uL (ref 0.1–1.0)
Monocytes Relative: 4 % (ref 3–12)
Neutro Abs: 2.9 10*3/uL (ref 1.7–7.7)
Neutrophils Relative %: 55 % (ref 43–77)
Platelets: 263 10*3/uL (ref 150–400)
RBC: 4.29 MIL/uL (ref 3.87–5.11)
RDW: 14.8 % (ref 11.5–15.5)
WBC: 5.3 10*3/uL (ref 4.0–10.5)

## 2013-09-27 LAB — COMPREHENSIVE METABOLIC PANEL
ALK PHOS: 60 U/L (ref 39–117)
ALT: 16 U/L (ref 0–35)
ANION GAP: 10 (ref 5–15)
AST: 24 U/L (ref 0–37)
Albumin: 3.5 g/dL (ref 3.5–5.2)
BUN: 10 mg/dL (ref 6–23)
CALCIUM: 9.1 mg/dL (ref 8.4–10.5)
CO2: 27 mEq/L (ref 19–32)
Chloride: 99 mEq/L (ref 96–112)
Creatinine, Ser: 0.92 mg/dL (ref 0.50–1.10)
GFR calc non Af Amer: 86 mL/min — ABNORMAL LOW (ref >90)
GLUCOSE: 107 mg/dL — AB (ref 70–99)
POTASSIUM: 3.7 meq/L (ref 3.7–5.3)
Sodium: 136 mEq/L — ABNORMAL LOW (ref 137–147)
TOTAL PROTEIN: 7.5 g/dL (ref 6.0–8.3)
Total Bilirubin: 0.4 mg/dL (ref 0.3–1.2)

## 2013-09-27 LAB — URINALYSIS, ROUTINE W REFLEX MICROSCOPIC
BILIRUBIN URINE: NEGATIVE
Glucose, UA: NEGATIVE mg/dL
HGB URINE DIPSTICK: NEGATIVE
Ketones, ur: NEGATIVE mg/dL
Nitrite: NEGATIVE
PH: 6.5 (ref 5.0–8.0)
Protein, ur: NEGATIVE mg/dL
Specific Gravity, Urine: 1.02 (ref 1.005–1.030)
Urobilinogen, UA: 0.2 mg/dL (ref 0.0–1.0)

## 2013-09-27 LAB — URINE MICROSCOPIC-ADD ON

## 2013-09-27 LAB — LIPASE, BLOOD: Lipase: 69 U/L — ABNORMAL HIGH (ref 11–59)

## 2013-09-27 LAB — PREGNANCY, URINE: Preg Test, Ur: NEGATIVE

## 2013-09-27 MED ORDER — HYDROMORPHONE HCL PF 1 MG/ML IJ SOLN
0.5000 mg | Freq: Once | INTRAMUSCULAR | Status: AC
Start: 2013-09-27 — End: 2013-09-27
  Administered 2013-09-27: 0.5 mg via INTRAVENOUS
  Filled 2013-09-27: qty 1

## 2013-09-27 MED ORDER — IOHEXOL 300 MG/ML  SOLN
100.0000 mL | Freq: Once | INTRAMUSCULAR | Status: AC | PRN
  Administered 2013-09-27: 100 mL via INTRAVENOUS

## 2013-09-27 MED ORDER — IOHEXOL 300 MG/ML  SOLN
50.0000 mL | Freq: Once | INTRAMUSCULAR | Status: AC | PRN
  Administered 2013-09-27: 50 mL via ORAL

## 2013-09-27 NOTE — ED Notes (Signed)
Patient transported to CT 

## 2013-09-27 NOTE — ED Provider Notes (Signed)
Patient returned for outpatient ultrasound. Results of this study shows cholelithiasis and cannot exclude cholecystitis. She appears comfortable and is feeling better. Reviewing her laboratory studies reveals no elevation of white count and normal LFTs and lipase. She will be provided with the contact information for Dr. Lovell Sheehan from general surgery with whom she can followup to discuss her options. She understands to return if she develops new and worsening symptoms.  Geoffery Lyons, MD 09/27/13 [phone]

## 2013-09-27 NOTE — Discharge Instructions (Signed)
Your ultrasound shows that you have gallstones.  You should followup with Brooke [name] from general surgery. His contact information has been provided on this discharge summary.  Return to the emergency department if you develop severe pain, high fever, bloody stool, or other new and concerning symptoms.  Abdominal Pain, Women Abdominal (stomach, pelvic, or belly) pain can be caused by many things. It is important to tell your doctor:  The location of the pain.  Does it come and go or is it present all the time?  Are there things that start the pain (eating certain foods, exercise)?  Are there other symptoms associated with the pain (fever, nausea, vomiting, diarrhea)? All of this is helpful to know when trying to find the cause of the pain. CAUSES   Stomach: virus or bacteria infection, or ulcer.  Intestine: appendicitis (inflamed appendix), regional ileitis (Crohn's disease), ulcerative colitis (inflamed colon), irritable bowel syndrome, diverticulitis (inflamed diverticulum of the colon), or cancer of the stomach or intestine.  Gallbladder disease or stones in the gallbladder.  Kidney disease, kidney stones, or infection.  Pancreas infection or cancer.  Fibromyalgia (pain disorder).  Diseases of the female organs:  Uterus: fibroid (non-cancerous) tumors or infection.  Fallopian tubes: infection or tubal pregnancy.  Ovary: cysts or tumors.  Pelvic adhesions (scar tissue).  Endometriosis (uterus lining tissue growing in the pelvis and on the pelvic organs).  Pelvic congestion syndrome (female organs filling up with blood just before the menstrual period).  Pain with the menstrual period.  Pain with ovulation (producing an egg).  Pain with an IUD (intrauterine device, birth control) in the uterus.  Cancer of the female organs.  Functional pain (pain not caused by a disease, may improve without treatment).  Psychological pain.  Depression. DIAGNOSIS  Your  doctor will decide the seriousness of your pain by doing an examination.  Blood tests.  X-rays.  Ultrasound.  CT scan (computed tomography, special type of X-ray).  MRI (magnetic resonance imaging).  Cultures, for infection.  Barium enema (dye inserted in the large intestine, to better view it with X-rays).  Colonoscopy (looking in intestine with a lighted tube).  Laparoscopy (minor surgery, looking in abdomen with a lighted tube).  Major abdominal exploratory surgery (looking in abdomen with a large incision). TREATMENT  The treatment will depend on the cause of the pain.   Many cases can be observed and treated at home.  Over-the-counter medicines recommended by your caregiver.  Prescription medicine.  Antibiotics, for infection.  Birth control pills, for painful periods or for ovulation pain.  Hormone treatment, for endometriosis.  Nerve blocking injections.  Physical therapy.  Antidepressants.  Counseling with a psychologist or psychiatrist.  Minor or major surgery. HOME CARE INSTRUCTIONS   Do not take laxatives, unless directed by your caregiver.  Take over-the-counter pain medicine only if ordered by your caregiver. Do not take aspirin because it can cause an upset stomach or bleeding.  Try a clear liquid diet (broth or water) as ordered by your caregiver. Slowly move to a bland diet, as tolerated, if the pain is related to the stomach or intestine.  Have a thermometer and take your temperature several times a day, and record it.  Bed rest and sleep, if it helps the pain.  Avoid sexual intercourse, if it causes pain.  Avoid stressful situations.  Keep your follow-up appointments and tests, as your caregiver orders.  If the pain does not go away with medicine or surgery, you may try:  Acupuncture.  Relaxation exercises (yoga, meditation).  Group therapy.  Counseling. SEEK MEDICAL CARE IF:   You notice certain foods cause stomach  pain.  Your home care treatment is not helping your pain.  You need stronger pain medicine.  You want your IUD removed.  You feel faint or lightheaded.  You develop nausea and vomiting.  You develop a rash.  You are having side effects or an allergy to your medicine. SEEK IMMEDIATE MEDICAL CARE IF:   Your pain does not go away or gets worse.  You have a fever.  Your pain is felt only in portions of the abdomen. The right side could possibly be appendicitis. The left lower portion of the abdomen could be colitis or diverticulitis.  You are passing blood in your stools (bright red or black tarry stools, with or without vomiting).  You have blood in your urine.  You develop chills, with or without a fever.  You pass out. MAKE SURE YOU:   Understand these instructions.  Will watch your condition.  Will get help right away if you are not doing well or get worse. Document Released: 12/05/2006 Document Revised: 06/24/2013 Document Reviewed: 12/25/2008 [facility] Patient Information 2015 [place], [place]. This information is not intended to replace advice given to you by your health care provider. Make sure you discuss any questions you have with your health care provider.  Biliary Colic  Biliary colic is a steady or irregular pain in the upper abdomen. It is usually under the right side of the rib cage. It happens when gallstones interfere with the normal flow of bile from the gallbladder. Bile is a liquid that helps to digest fats. Bile is made in the liver and stored in the gallbladder. When you eat a meal, bile passes from the gallbladder through the cystic duct and the common bile duct into the small intestine. There, it mixes with partially digested food. If a gallstone blocks either of these ducts, the normal flow of bile is blocked. The muscle cells in the bile duct contract forcefully to try to move the stone. This causes the pain of biliary colic.  SYMPTOMS   A person with  biliary colic usually complains of pain in the upper abdomen. This pain can be:  In the center of the upper abdomen just below the breastbone.  In the upper-right part of the abdomen, near the gallbladder and liver.  Spread back toward the right shoulder blade.  Nausea and vomiting.  The pain usually occurs after eating.  Biliary colic is usually triggered by the digestive system's demand for bile. The demand for bile is high after fatty meals. Symptoms can also occur when a person who has been fasting suddenly eats a very large meal. Most episodes of biliary colic pass after 1 to 5 hours. After the most intense pain passes, your abdomen may continue to ache mildly for about 24 hours. DIAGNOSIS  After you describe your symptoms, your caregiver will perform a physical exam. He or she will pay attention to the upper right portion of your belly (abdomen). This is the area of your liver and gallbladder. An ultrasound will help your caregiver look for gallstones. Specialized scans of the gallbladder may also be done. Blood tests may be done, especially if you have fever or if your pain persists. PREVENTION  Biliary colic can be prevented by controlling the risk factors for gallstones. Some of these risk factors, such as heredity, increasing age, and pregnancy are a normal part of life. Obesity  and a high-fat diet are risk factors you can change through a healthy lifestyle. Women going through menopause who take hormone replacement therapy (estrogen) are also more likely to develop biliary colic. TREATMENT   Pain medication may be prescribed.  You may be encouraged to eat a fat-free diet.  If the first episode of biliary colic is severe, or episodes of colic keep retuning, surgery to remove the gallbladder (cholecystectomy) is usually recommended. This procedure can be done through small incisions using an instrument called a laparoscope. The procedure often requires a brief stay in the hospital.  Some people can leave the hospital the same day. It is the most widely used treatment in people troubled by painful gallstones. It is effective and safe, with no complications in more than 90% of cases.  If surgery cannot be done, medication that dissolves gallstones may be used. This medication is expensive and can take months or years to work. Only small stones will dissolve.  Rarely, medication to dissolve gallstones is combined with a procedure called shock-wave lithotripsy. This procedure uses carefully aimed shock waves to break up gallstones. In many people treated with this procedure, gallstones form again within a few years. PROGNOSIS  If gallstones block your cystic duct or common bile duct, you are at risk for repeated episodes of biliary colic. There is also a 25% chance that you will develop a gallbladder infection(acute cholecystitis), or some other complication of gallstones within 10 to 20 years. If you have surgery, schedule it at a time that is convenient for you and at a time when you are not sick. HOME CARE INSTRUCTIONS   Drink plenty of clear fluids.  Avoid fatty, greasy or fried foods, or any foods that make your pain worse.  Take medications as directed. SEEK MEDICAL CARE IF:   You develop a fever over 100.5 F (38.1 C).  Your pain gets worse over time.  You develop nausea that prevents you from eating and drinking.  You develop vomiting. SEEK IMMEDIATE MEDICAL CARE IF:   You have continuous or severe belly (abdominal) pain which is not relieved with medications.  You develop nausea and vomiting which is not relieved with medications.  You have symptoms of biliary colic and you suddenly develop a fever and shaking chills. This may signal cholecystitis. Call your caregiver immediately.  You develop a yellow color to your skin or the white part of your eyes (jaundice). Document Released: 07/11/2005 Document Revised: 05/02/2011 Document Reviewed:  09/20/2007 [facility] Patient Information 2015 [place], [place]. This information is not intended to replace advice given to you by your health care provider. Make sure you discuss any questions you have with your health care provider.

## 2013-09-27 NOTE — ED Provider Notes (Signed)
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  Sx are moderate at this time, no n/v.  On exam, she has a very soft morbidly obese abdomen with RUQ ttp.  No other ttp, normal heart and lung exam.  eval for cholecystitis, pancreatitis,  CT pending as there is no [place] availability at 3 AM.  Medical screening examination/treatment/procedure(s) were conducted as a shared visit with non-physician practitioner(s) and myself.  I personally evaluated the patient during the encounter.  Clinical Impression: Ovarian Cyst      Vida Roller, MD 09/27/13 [phone]

## 2013-09-27 NOTE — ED Provider Notes (Signed)
Medical screening examination/treatment/procedure(s) were conducted as a shared visit with non-physician practitioner(s) and myself.  I personally evaluated the patient during the encounter  Prior to the pt's dc, I reevaluated her and found her to have no ttp in the RUQ - CT without acute findings - US ordered for today - pt instructed by me to return for worsening pain / vomiting  / fevers  Please see my separate respective documentation pertaining to this patient encounter   Vida RollerBrian D Karlei Waldo, MD 09/27/13 [phone]

## 2013-09-27 NOTE — ED Notes (Signed)
Pt c/o RUQ pain x 1.5 hours.

## 2013-09-27 NOTE — ED Provider Notes (Signed)
CSN: [id number]     Arrival date & time 09/27/13  0212 History   First MD Initiated Contact with Patient 09/27/13 0226     Chief Complaint  Patient presents with  . Abdominal Pain     (Consider location/radiation/quality/duration/timing/severity/associated sxs/prior Treatment) HPI  Brooke [name] is a 28 y.o. female complains of RUQ abd pain for the past 1.5 hrs. She says she got home and thought she was hungry and ate a granola bar, peanuts and graham crackers and the pain slowly got worse. The pain does not radiate, is not positional or exertional and does not have a respiratory component. She characterizes the pain as a constant pressure that feels better when she applies direct pressure over her RUQ. Her last bowel movement was earlier today and was per her normal other than smelling "like iron" - she takes iron supplements. She has not had any abd surgeries.  She denies any HA, cp, SOB,  N/V, diaphoresis, dysuria, unusual swelling in extremities.  Past Medical History  Diagnosis Date  . Anxiety   . Edema   . Chronic back pain    History reviewed. No pertinent past surgical history. Family History  Problem Relation Age of Onset  . Diabetes Mother    History  Substance Use Topics  . Smoking status: Former Smoker    Quit date: 09/16/2007  . Smokeless tobacco: Never Used  . Alcohol Use: No   OB History   Grav Para Term Preterm Abortions TAB SAB Ect Mult Living   2    2  1 1   0     Review of Systems  Constitutional: Negative for fever.  HENT: Negative for sore throat.   Eyes: Negative for visual disturbance.  Respiratory: Negative for shortness of breath.   Cardiovascular: Negative for chest pain.  Gastrointestinal: Positive for abdominal pain.  Endocrine: Negative for polyuria.  Genitourinary: Negative for dysuria.  Skin: Negative for rash.  Neurological: Negative for headaches.  All other systems reviewed and are negative.     Allergies  Latex  Home  Medications   Prior to Admission medications   Medication Sig Start Date End Date Taking? Authorizing Provider  phentermine 37.5 MG capsule Take 37.5 mg by mouth every morning.   Yes Historical Provider, MD  cephALEXin (KEFLEX) 500 MG capsule Take 1 capsule (500 mg total) by mouth 4 (four) times daily. 01/15/13   Samuel JesterKathleen McManus, DO  cyclobenzaprine (FLEXERIL) 10 MG tablet Take 1 tablet by mouth 3 (three) times daily as needed for muscle spasms.  01/09/13   Historical Provider, MD  cyclobenzaprine (FLEXERIL) 10 MG tablet Take 1 tablet (10 mg total) by mouth 2 (two) times daily as needed for muscle spasms. 04/19/13   Dione Boozeavid Glick, MD  ferrous sulfate 325 (65 FE) MG tablet Take 325 mg by mouth every morning.    Historical Provider, MD  fluticasone (FLONASE) 50 MCG/ACT nasal spray Place 2 sprays into the nose daily.    Historical Provider, MD  furosemide (LASIX) 20 MG tablet Take 20 mg by mouth daily.    Historical Provider, MD  HYDROcodone-acetaminophen (NORCO) 5-325 MG per tablet Take 1 tablet by mouth every 4 (four) hours as needed for moderate pain. 01/07/13   Kathie DikeHobson M Bryant, PA-C  hydrocortisone 1 % ointment Apply 1 application topically 2 (two) times daily. 07/26/13   Gilda Creasehristopher J. Pollina, MD  hydrOXYzine (ATARAX/VISTARIL) 25 MG tablet Take 1 tablet (25 mg total) by mouth every 6 (six) hours. 07/26/13   Cristal Deerhristopher  J. Pollina, MD   BP 127/72  Pulse 87  Temp(Src) 97.6 F (36.4 C) (Oral)  Resp 20  SpO2 97%  LMP 09/17/2013 Physical Exam  Nursing note and vitals reviewed. Constitutional: She is oriented to person, place, and time. She appears well-developed and well-nourished.  Obese female   HENT:  Head: Normocephalic and atraumatic.  Mouth/Throat: Oropharynx is clear and moist.  Eyes: Conjunctivae are normal. Pupils are equal, round, and reactive to light. Right eye exhibits no discharge. Left eye exhibits no discharge. No scleral icterus.  Neck: Neck supple.  Cardiovascular: Normal  rate, regular rhythm and normal heart sounds.   Pulmonary/Chest: Effort normal and breath sounds normal. No respiratory distress. She has no wheezes. She has no rales.  Abdominal: Soft. There is no tenderness.  Diffuse TTP over RUQ that reproduces pain. No lesions or appreciable rashes. Bowel Sound unable to auscultate due to body habitus.  Musculoskeletal: She exhibits no tenderness.  Neurological: She is alert and oriented to person, place, and time.  Cranial Nerves II-XII grossly intact  Skin: Skin is warm and dry. No rash noted.  Psychiatric: She has a normal mood and affect.    ED Course  Procedures (including critical care time) Labs Review Labs Reviewed  URINALYSIS, ROUTINE W REFLEX MICROSCOPIC - Abnormal; Notable for the following:    Leukocytes, UA LARGE (*)    All other components within normal limits  CBC WITH DIFFERENTIAL - Abnormal; Notable for the following:    Hemoglobin 11.8 (*)    HCT 34.4 (*)    All other components within normal limits  COMPREHENSIVE METABOLIC PANEL - Abnormal; Notable for the following:    Sodium 136 (*)    Glucose, Bld 107 (*)    GFR calc non Af Amer 86 (*)    All other components within normal limits  LIPASE, BLOOD - Abnormal; Notable for the following:    Lipase 69 (*)    All other components within normal limits  URINE MICROSCOPIC-ADD ON - Abnormal; Notable for the following:    Squamous Epithelial / LPF MANY (*)    Bacteria, UA MANY (*)    All other components within normal limits  PREGNANCY, URINE    Imaging Review Ct Abdomen Pelvis W Contrast  09/27/2013   CLINICAL DATA:  Right upper quadrant pain.  EXAM: CT ABDOMEN AND PELVIS WITH CONTRAST  TECHNIQUE: Multidetector CT imaging of the abdomen and pelvis was performed using the standard protocol following bolus administration of intravenous contrast.  CONTRAST:  50mL OMNIPAQUE IOHEXOL 300 MG/ML SOLN, OMNIPAQUE IOHEXOL 300 MG/ML SOLN  COMPARISON:  CT of the abdomen and pelvis August 18, 2012  FINDINGS: Large body habitus results in overall noisy image quality.  LUNG BASES: Included view of the lung bases are clear. Visualized heart and pericardium are unremarkable.  SOLID ORGANS: The liver, spleen, pancreas and adrenal glands are unremarkable. Possible gallstones on coronal image 31/101 without superimposed CT findings of acute cholecystitis.  GASTROINTESTINAL TRACT: The stomach, small and large bowel are normal in course and caliber without inflammatory changes. Enteric contrast has not yet reached the distal small bowel. Normal appendix.  KIDNEYS/ URINARY TRACT: Kidneys are orthotopic, demonstrating symmetric enhancement. No nephrolithiasis, hydronephrosis or solid renal masses. The unopacified ureters are normal in course and caliber. Urinary bladder is partially distended and unremarkable.  PERITONEUM/RETROPERITONEUM: No intraperitoneal free fluid nor free air. Aortoiliac vessels are normal in course and caliber. No lymphadenopathy by CT size criteria. Sub cm lymph nodes in  the right lower quadrant are unchanged and likely reactive. Internal reproductive organs are unremarkable.  SOFT TISSUE/OSSEOUS STRUCTURES: Nonsuspicious.  Large body habitus.  IMPRESSION: No acute intra-abdominal or pelvic process.  Possible cholelithiasis.  Normal appendix.   Electronically Signed   By: Awilda Metro   On: 09/27/2013 05:12     EKG Interpretation None      MDM  Pt is resting comfortably, Vitals stable Labwork is essentially normal Pain has been well controlled. CT abd/pelvis non-confirmatory will obtain US RUQ outpatient tomorrow Discussed ED course of action and plan to secure RUQ [place] tomorrow for further evaluation of abd discomfort, patient receptive to this plan.  Final diagnoses:  RUQ abdominal pain     Prior to patient discharge, I discussed and reviewed this case with Dr.Brian Hyacinth Meeker   Meds given in ED:  Medications  HYDROmorphone (DILAUDID) injection 0.5 mg (0.5 mg  Intravenous Given 09/27/13 0344)  iohexol (OMNIPAQUE) 300 MG/ML solution 50 mL (50 mLs Oral Contrast Given 09/27/13 0403)  iohexol (OMNIPAQUE) 300 MG/ML solution 100 mL (100 mLs Intravenous Contrast Given 09/27/13 0453)    Discharge Medication List as of 09/27/2013  6:02 AM       Sharlene Motts, PA-C 09/27/13 [phone]

## 2013-10-01 ENCOUNTER — Encounter (HOSPITAL_COMMUNITY): Payer: Self-pay | Admitting: Pharmacy Technician

## 2013-10-01 ENCOUNTER — Ambulatory Visit: Payer: BC Managed Care – PPO | Admitting: Dietician

## 2013-10-02 NOTE — H&P (Signed)
NTS SOAP Note  Vital Signs:  Vitals as of: 10/01/2013: Systolic 133: Diastolic 76: Heart Rate 75: Temp 96.9F: Height 35ft 4in: Weight 368Lbs 0 Ounces: BMI 63.17  BMI : 63.17 kg/m2  Subjective: This 28 Years 26 Months old Female presents for of biliary colic.  Has had an episode of right upper quadrant abdominal pain with radiation to the back.  No fever, chills, jaundice.  First episode.  Made worse with fatty food.  Currently feels fine.  Review of Symptoms:    thirsty Head:unremarkable    Eyes:unremarkable   Nose/Mouth/Throat:sinus problems Cardiovascular:  unremarkable   Respiratory:unremarkable   Gastrointestinal:  unremarkable   Musculoskeletal:unremarkable   Skin:unremarkable Hematolgic/Lymphatic:unremarkable     Allergic/Immunologic:unremarkable     Past Medical History:    Reviewed  Past Medical History  Surgical History: none Medical Problems: htn Allergies: latex Medications: diclofenac, hydroxyzine, phentermine, lasix, zertec, iron   Social History:Reviewed  Social History  Preferred Language: English Race:  Black or African American Ethnicity: Not Hispanic / Latino Age: 28 Years 26 Months Marital Status:  S Alcohol: no   Smoking Status: Never smoker reviewed on 10/01/2013 Functional Status reviewed on 10/01/2013 ------------------------------------------------ Bathing: Normal Cooking: Normal Dressing: Normal Driving: Normal Eating: Normal Managing Meds: Normal Oral Care: Normal Shopping: Normal Toileting: Normal Transferring: Normal Walking: Normal Cognitive Status reviewed on 10/01/2013 ------------------------------------------------ Attention: Normal Decision Making: Normal Language: Normal Memory: Normal Motor: Normal Perception: Normal Problem Solving: Normal Visual and Spatial: Normal   Family History:  Reviewed  Family Health History Mother, Living; Diabetes mellitus, unspecified type;   Father, Living; Healthy;     Objective Information:   Obese bf in NAD   no scleral icterus Neck:  Supple without lymphadenopathy.  Heart:  RRR, no murmur or gallop.  Normal S1, S2.  No S3, S4.  Lungs:    CTA bilaterally, no wheezes, rhonchi, rales.  Breathing unlabored. Abdomen:Soft, NT/ND, normal bowel sounds, no HSM, no masses.  No peritoneal signs.  U/S of gallbladder:  cholelithiasis, normal common bile duct  Assessment:Biliary colic, cholelithiasis  Diagnoses: 574.20 Gallstone (Calculus of gallbladder without cholecystitis without obstruction)  Procedures: 47096 - OFFICE OUTPATIENT NEW 30 MINUTES    Plan:  Scheduled for laparoscopic cholecystectomy on 10/07/13.   Patient Education:Alternative treatments to surgery were discussed with patient (and family).  Risks and benefits  of procedure including bleeding, infection, hepatobiliary injury, and the possibility of an open procedure were fully explained to the patient (and family) who gave informed consent. Patient/family questions were addressed.  Follow-up:Pending Surgery

## 2013-10-03 ENCOUNTER — Encounter (HOSPITAL_COMMUNITY)
Admission: RE | Admit: 2013-10-03 | Discharge: 2013-10-03 | Disposition: A | Discharge status: Home / Self Care | Payer: BC Managed Care – PPO | Source: Ambulatory Visit | Attending: General Surgery | Admitting: General Surgery

## 2013-10-04 ENCOUNTER — Encounter (HOSPITAL_COMMUNITY): Payer: Self-pay

## 2013-10-07 ENCOUNTER — Encounter (HOSPITAL_COMMUNITY): Payer: BC Managed Care – PPO | Admitting: Anesthesiology

## 2013-10-07 ENCOUNTER — Ambulatory Visit (HOSPITAL_COMMUNITY): Payer: BC Managed Care – PPO | Admitting: Anesthesiology

## 2013-10-07 ENCOUNTER — Ambulatory Visit (HOSPITAL_COMMUNITY)
Admission: RE | Admit: 2013-10-07 | Discharge: 2013-10-07 | Disposition: A | Discharge status: Home / Self Care | Payer: BC Managed Care – PPO | Source: Ambulatory Visit | Attending: General Surgery | Admitting: General Surgery

## 2013-10-07 ENCOUNTER — Encounter (HOSPITAL_COMMUNITY): Payer: Self-pay | Admitting: *Deleted

## 2013-10-07 ENCOUNTER — Encounter (HOSPITAL_COMMUNITY)
Admission: RE | Disposition: A | Discharge status: Home / Self Care | Payer: Self-pay | Source: Ambulatory Visit | Attending: General Surgery

## 2013-10-07 DIAGNOSIS — K8020 Calculus of gallbladder without cholecystitis without obstruction: Secondary | ICD-10-CM | POA: Insufficient documentation

## 2013-10-07 DIAGNOSIS — Z6841 Body Mass Index (BMI) 40.0 and over, adult: Secondary | ICD-10-CM | POA: Diagnosis not present

## 2013-10-07 DIAGNOSIS — Z79899 Other long term (current) drug therapy: Secondary | ICD-10-CM | POA: Insufficient documentation

## 2013-10-07 DIAGNOSIS — Z87891 Personal history of nicotine dependence: Secondary | ICD-10-CM | POA: Insufficient documentation

## 2013-10-07 DIAGNOSIS — F411 Generalized anxiety disorder: Secondary | ICD-10-CM | POA: Diagnosis not present

## 2013-10-07 DIAGNOSIS — Z791 Long term (current) use of non-steroidal anti-inflammatories (NSAID): Secondary | ICD-10-CM | POA: Insufficient documentation

## 2013-10-07 DIAGNOSIS — I10 Essential (primary) hypertension: Secondary | ICD-10-CM | POA: Insufficient documentation

## 2013-10-07 HISTORY — PX: CHOLECYSTECTOMY: SHX55

## 2013-10-07 LAB — GLUCOSE, CAPILLARY
Glucose-Capillary: 74 mg/dL (ref 70–99)
Glucose-Capillary: 162 mg/dL — ABNORMAL HIGH (ref 70–99)

## 2013-10-07 LAB — PREGNANCY, URINE: Preg Test, Ur: NEGATIVE

## 2013-10-07 SURGERY — LAPAROSCOPIC CHOLECYSTECTOMY
Anesthesia: General | Site: Abdomen

## 2013-10-07 MED ORDER — PROPOFOL 10 MG/ML IV BOLUS
INTRAVENOUS | Status: AC
  Filled 2013-10-07: qty 20

## 2013-10-07 MED ORDER — CIPROFLOXACIN IN D5W 400 MG/200ML IV SOLN
400.0000 mg | INTRAVENOUS | Status: AC
  Administered 2013-10-07: 400 mg via INTRAVENOUS

## 2013-10-07 MED ORDER — FENTANYL CITRATE 0.05 MG/ML IJ SOLN
INTRAMUSCULAR | Status: AC
Start: 2013-10-07 — End: 2013-10-07
  Filled 2013-10-07: qty 5

## 2013-10-07 MED ORDER — GLYCOPYRROLATE 0.2 MG/ML IJ SOLN
INTRAMUSCULAR | Status: AC
  Filled 2013-10-07: qty 4

## 2013-10-07 MED ORDER — ONDANSETRON HCL 4 MG/2ML IJ SOLN
INTRAMUSCULAR | Status: AC
  Filled 2013-10-07: qty 2

## 2013-10-07 MED ORDER — NEOSTIGMINE METHYLSULFATE 10 MG/10ML IV SOLN
INTRAVENOUS | Status: DC | PRN
  Administered 2013-10-07: 4 mg via INTRAVENOUS

## 2013-10-07 MED ORDER — ROCURONIUM BROMIDE 50 MG/5ML IV SOLN
INTRAVENOUS | Status: AC
  Filled 2013-10-07: qty 1

## 2013-10-07 MED ORDER — CHLORHEXIDINE GLUCONATE 4 % EX LIQD: 1.0000 "application " | Freq: Once | CUTANEOUS | Status: DC

## 2013-10-07 MED ORDER — DEXTROSE 50 % IV SOLN
INTRAVENOUS | Status: AC
  Filled 2013-10-07: qty 50

## 2013-10-07 MED ORDER — SUCCINYLCHOLINE CHLORIDE 20 MG/ML IJ SOLN
INTRAMUSCULAR | Status: DC | PRN
  Administered 2013-10-07: 120 mg via INTRAVENOUS

## 2013-10-07 MED ORDER — SUCCINYLCHOLINE CHLORIDE 20 MG/ML IJ SOLN
INTRAMUSCULAR | Status: AC
  Filled 2013-10-07: qty 1

## 2013-10-07 MED ORDER — FENTANYL CITRATE 0.05 MG/ML IJ SOLN
INTRAMUSCULAR | Status: AC
  Filled 2013-10-07: qty 5

## 2013-10-07 MED ORDER — ONDANSETRON HCL 4 MG/2ML IJ SOLN
4.0000 mg | Freq: Once | INTRAMUSCULAR | Status: AC | PRN
  Administered 2013-10-07: 4 mg via INTRAVENOUS
  Filled 2013-10-07: qty 2

## 2013-10-07 MED ORDER — GLYCOPYRROLATE 0.2 MG/ML IJ SOLN
0.2000 mg | Freq: Once | INTRAMUSCULAR | Status: AC
Start: 2013-10-07 — End: 2013-10-07
  Administered 2013-10-07: 0.2 mg via INTRAVENOUS

## 2013-10-07 MED ORDER — ATROPINE SULFATE 0.4 MG/ML IJ SOLN
INTRAMUSCULAR | Status: AC
  Filled 2013-10-07: qty 1

## 2013-10-07 MED ORDER — SODIUM CHLORIDE 0.9 % IR SOLN
Status: DC | PRN
  Administered 2013-10-07: 500 mL

## 2013-10-07 MED ORDER — ROCURONIUM BROMIDE 100 MG/10ML IV SOLN
INTRAVENOUS | Status: DC | PRN
  Administered 2013-10-07: 20 mg via INTRAVENOUS
  Administered 2013-10-07: 10 mg via INTRAVENOUS
  Administered 2013-10-07: 5 mg via INTRAVENOUS

## 2013-10-07 MED ORDER — PROPOFOL 10 MG/ML IV BOLUS
INTRAVENOUS | Status: DC | PRN
  Administered 2013-10-07: 150 mg via INTRAVENOUS

## 2013-10-07 MED ORDER — POVIDONE-IODINE 10 % EX OINT
TOPICAL_OINTMENT | CUTANEOUS | Status: AC
  Filled 2013-10-07: qty 1

## 2013-10-07 MED ORDER — FENTANYL CITRATE 0.05 MG/ML IJ SOLN
INTRAMUSCULAR | Status: DC | PRN
  Administered 2013-10-07 (×7): 50 ug via INTRAVENOUS

## 2013-10-07 MED ORDER — MIDAZOLAM HCL 2 MG/2ML IJ SOLN
1.0000 mg | INTRAMUSCULAR | Status: DC | PRN
  Administered 2013-10-07: 2 mg via INTRAVENOUS

## 2013-10-07 MED ORDER — DEXAMETHASONE SODIUM PHOSPHATE 4 MG/ML IJ SOLN
4.0000 mg | Freq: Once | INTRAMUSCULAR | Status: AC
  Administered 2013-10-07: 4 mg via INTRAVENOUS

## 2013-10-07 MED ORDER — ARTIFICIAL TEARS OP OINT
TOPICAL_OINTMENT | OPHTHALMIC | Status: DC | PRN
  Administered 2013-10-07: 1 via OPHTHALMIC

## 2013-10-07 MED ORDER — DEXTROSE 50 % IV SOLN
12.5000 g | Freq: Once | INTRAVENOUS | Status: AC
  Administered 2013-10-07: 12.5 g via INTRAVENOUS

## 2013-10-07 MED ORDER — LACTATED RINGERS IV SOLN
INTRAVENOUS | Status: DC | PRN
  Administered 2013-10-07 (×2): via INTRAVENOUS

## 2013-10-07 MED ORDER — LACTATED RINGERS IV SOLN
INTRAVENOUS | Status: DC
  Administered 2013-10-07: 08:00:00 via INTRAVENOUS

## 2013-10-07 MED ORDER — ARTIFICIAL TEARS OP OINT
TOPICAL_OINTMENT | OPHTHALMIC | Status: AC
  Filled 2013-10-07: qty 3.5

## 2013-10-07 MED ORDER — HEMOSTATIC AGENTS (NO CHARGE) OPTIME
TOPICAL | Status: DC | PRN
  Administered 2013-10-07: 1 via TOPICAL

## 2013-10-07 MED ORDER — POVIDONE-IODINE 10 % OINT PACKET
TOPICAL_OINTMENT | CUTANEOUS | Status: DC | PRN
  Administered 2013-10-07: 1 via TOPICAL

## 2013-10-07 MED ORDER — FENTANYL CITRATE 0.05 MG/ML IJ SOLN: 25.0000 ug | INTRAMUSCULAR | Status: DC | PRN

## 2013-10-07 MED ORDER — OXYCODONE-ACETAMINOPHEN 7.5-325 MG PO TABS: 1.0000 | ORAL_TABLET | ORAL | Status: DC | PRN

## 2013-10-07 MED ORDER — LIDOCAINE HCL (CARDIAC) 20 MG/ML IV SOLN
INTRAVENOUS | Status: DC | PRN
  Administered 2013-10-07: 50 mg via INTRAVENOUS

## 2013-10-07 MED ORDER — EPHEDRINE SULFATE 50 MG/ML IJ SOLN
INTRAMUSCULAR | Status: AC
  Filled 2013-10-07: qty 1

## 2013-10-07 MED ORDER — MIDAZOLAM HCL 2 MG/2ML IJ SOLN
INTRAMUSCULAR | Status: AC
  Filled 2013-10-07: qty 2

## 2013-10-07 MED ORDER — BUPIVACAINE HCL (PF) 0.5 % IJ SOLN
INTRAMUSCULAR | Status: DC | PRN
  Administered 2013-10-07: 10 mL

## 2013-10-07 MED ORDER — DEXAMETHASONE SODIUM PHOSPHATE 4 MG/ML IJ SOLN
INTRAMUSCULAR | Status: AC
  Filled 2013-10-07: qty 1

## 2013-10-07 MED ORDER — KETOROLAC TROMETHAMINE 30 MG/ML IJ SOLN
30.0000 mg | Freq: Once | INTRAMUSCULAR | Status: AC
  Administered 2013-10-07: 30 mg via INTRAVENOUS
  Filled 2013-10-07: qty 1

## 2013-10-07 MED ORDER — ONDANSETRON HCL 4 MG/2ML IJ SOLN
4.0000 mg | Freq: Once | INTRAMUSCULAR | Status: AC
  Administered 2013-10-07: 4 mg via INTRAVENOUS

## 2013-10-07 MED ORDER — GLYCOPYRROLATE 0.2 MG/ML IJ SOLN
INTRAMUSCULAR | Status: DC | PRN
  Administered 2013-10-07: 0.6 mg via INTRAVENOUS

## 2013-10-07 MED ORDER — LIDOCAINE HCL (PF) 1 % IJ SOLN
INTRAMUSCULAR | Status: AC
  Filled 2013-10-07: qty 5

## 2013-10-07 MED ORDER — SODIUM CHLORIDE 0.9 % IJ SOLN
INTRAMUSCULAR | Status: AC
  Filled 2013-10-07: qty 10

## 2013-10-07 MED ORDER — LIDOCAINE HCL (PF) 1 % IJ SOLN
INTRAMUSCULAR | Status: AC
  Filled 2013-10-07: qty 15

## 2013-10-07 MED ORDER — BUPIVACAINE HCL (PF) 0.5 % IJ SOLN
INTRAMUSCULAR | Status: AC
  Filled 2013-10-07: qty 30

## 2013-10-07 MED ORDER — GLYCOPYRROLATE 0.2 MG/ML IJ SOLN
INTRAMUSCULAR | Status: AC
  Filled 2013-10-07: qty 1

## 2013-10-07 MED ORDER — CIPROFLOXACIN IN D5W 400 MG/200ML IV SOLN
INTRAVENOUS | Status: AC
  Filled 2013-10-07: qty 200

## 2013-10-07 SURGICAL SUPPLY — 43 items
APPLIER CLIP LAPSCP 10X32 DD (CLIP) ×2 IMPLANT
BAG HAMPER (MISCELLANEOUS) ×2 IMPLANT
BAG SPEC RTRVL LRG 6X4 10 (ENDOMECHANICALS) ×1
BLADE 11 SAFETY STRL DISP (BLADE) ×2 IMPLANT
CLOTH BEACON ORANGE TIMEOUT ST (SAFETY) ×2 IMPLANT
COVER LIGHT HANDLE STERIS (MISCELLANEOUS) ×4 IMPLANT
DECANTER SPIKE VIAL GLASS SM (MISCELLANEOUS) ×2 IMPLANT
DURAPREP 26ML APPLICATOR (WOUND CARE) ×2 IMPLANT
ELECT REM PT RETURN 9FT ADLT (ELECTROSURGICAL) ×2
ELECTRODE REM PT RTRN 9FT ADLT (ELECTROSURGICAL) ×1 IMPLANT
FILTER SMOKE EVAC LAPAROSHD (FILTER) ×2 IMPLANT
FORMALIN 10 PREFIL 120ML (MISCELLANEOUS) ×2 IMPLANT
GLOVE INDICATOR 7.0 STRL GRN (GLOVE) ×2 IMPLANT
GLOVE SKINSENSE NS SZ6.5 (GLOVE) ×1
GLOVE SKINSENSE NS SZ7.0 (GLOVE) ×1
GLOVE SKINSENSE STRL SZ6.5 (GLOVE) IMPLANT
GLOVE SKINSENSE STRL SZ7.0 (GLOVE) IMPLANT
GLOVE SURG SS PI 7.5 STRL IVOR (GLOVE) ×2 IMPLANT
GOWN STRL REUS W/TWL LRG LVL3 (GOWN DISPOSABLE) ×6 IMPLANT
HEMOSTAT SNOW SURGICEL 2X4 (HEMOSTASIS) ×2 IMPLANT
INST SET LAPROSCOPIC AP (KITS) ×2 IMPLANT
IV NS IRRIG 3000ML ARTHROMATIC (IV SOLUTION) IMPLANT
KIT ROOM TURNOVER APOR (KITS) ×2 IMPLANT
MANIFOLD NEPTUNE II (INSTRUMENTS) ×2 IMPLANT
NDL INSUFFLATION 14GA 120MM (NEEDLE) ×1 IMPLANT
NEEDLE INSUFFLATION 14GA 120MM (NEEDLE) ×2 IMPLANT
NS IRRIG 1000ML POUR BTL (IV SOLUTION) ×2 IMPLANT
PACK LAP CHOLE LZT030E (CUSTOM PROCEDURE TRAY) ×2 IMPLANT
PAD ARMBOARD 7.5X6 YLW CONV (MISCELLANEOUS) ×2 IMPLANT
POUCH SPECIMEN RETRIEVAL 10MM (ENDOMECHANICALS) ×2 IMPLANT
SET BASIN LINEN APH (SET/KITS/TRAYS/PACK) ×2 IMPLANT
SET TUBE IRRIG SUCTION NO TIP (IRRIGATION / IRRIGATOR) IMPLANT
SLEEVE ENDOPATH XCEL 5M (ENDOMECHANICALS) ×2 IMPLANT
SPONGE GAUZE 2X2 8PLY STRL LF (GAUZE/BANDAGES/DRESSINGS) ×8 IMPLANT
STAPLER VISISTAT (STAPLE) ×2 IMPLANT
SUT VICRYL 0 UR6 27IN ABS (SUTURE) ×2 IMPLANT
TAPE CLOTH SURG 4X10 WHT LF (GAUZE/BANDAGES/DRESSINGS) ×1 IMPLANT
TROCAR ENDO BLADELESS 11MM (ENDOMECHANICALS) ×2 IMPLANT
TROCAR XCEL NON-BLD 5MMX100MML (ENDOMECHANICALS) ×2 IMPLANT
TROCAR XCEL UNIV SLVE 11M 100M (ENDOMECHANICALS) ×2 IMPLANT
TUBING INSUFFLATION (TUBING) ×2 IMPLANT
WARMER LAPAROSCOPE (MISCELLANEOUS) ×2 IMPLANT
YANKAUER SUCT 12FT TUBE ARGYLE (SUCTIONS) ×2 IMPLANT

## 2013-10-07 NOTE — Anesthesia Preprocedure Evaluation (Addendum)
Anesthesia Evaluation  Patient identified by MRN, date of birth, ID band Patient awake    Reviewed: Allergy & Precautions, H&P , NPO status , Patient's Chart, lab work & pertinent test results  Airway Mallampati: I TM Distance: >3 FB     Dental  (+) Teeth Intact   Pulmonary former smoker,  breath sounds clear to auscultation        Cardiovascular negative cardio ROS  Rhythm:Regular     Neuro/Psych PSYCHIATRIC DISORDERS Anxiety Chronic LBP     GI/Hepatic (+)     substance abuse  marijuana use, Biliary colic    Endo/Other  Morbid obesity  Renal/GU      Musculoskeletal   Abdominal   Peds  Hematology   Anesthesia Other Findings   Reproductive/Obstetrics                          Anesthesia Physical Anesthesia Plan  ASA: II  Anesthesia Plan: General   Post-op Pain Management:    Induction: Intravenous, Rapid sequence and Cricoid pressure planned  Airway Management Planned: Oral ETT  Additional Equipment:   Intra-op Plan:   Post-operative Plan: Extubation in OR  Informed Consent: I have reviewed the patients History and Physical, chart, labs and discussed the procedure including the risks, benefits and alternatives for the proposed anesthesia with the patient or authorized representative who has indicated his/her understanding and acceptance.     Plan Discussed with:   Anesthesia Plan Comments:         Anesthesia Quick Evaluation

## 2013-10-07 NOTE — Op Note (Signed)
Patient:  Brooke [name]  DOB:  March 13, 1985  MRN:  [id number]   Preop Diagnosis:  Cholelithiasis  Postop Diagnosis:  Same  Procedure:  Laparoscopic cholecystectomy  Surgeon:  Franky MachoMark Dajanee Voorheis, M.D.  Anes:  General endotracheal  Indications:  Patient is a 28 year old white female who presents with cholecystitis secondary to cholelithiasis. The risks and benefits of the procedure clubbing bleeding, infection, hepatobiliary injury, and the possibility of an open procedure were fully explained to the patient, who gave informed consent.  Procedure note:  The patient is placed the supine position. After induction of general endotracheal anesthesia, the abdomen was prepped and draped using usual sterile technique with DuraPrep. Surgical site confirmation was performed.  A supraumbilical incision was made down to the fascia. A Veress needle was introduced into the abdominal cavity and confirmation of placement was done using the saline drop test. The abdomen was then insufflated to 16 mm mercury pressure. An 11 mm trocar was introduced into the abdominal cavity under direct visualization without difficulty. Patient was placed in reverse Trendelenburg position and additional 11 mm trocar was placed the epigastric region and 5 mm trochars were placed the right upper quadrant and right flank regions. Liver was inspected and noted within normal limits. The gallbladder was retracted in a dynamic fashion in order to expose the triangle of Calot. The cystic duct was first identified. Its junction to the infundibulum was fully identified. Endoclips were placed proximally distally on the cystic duct, and the cystic duct was divided. This was likewise done cystic artery. The gallbladder was then freed away from the gallbladder fossa using Bovie electrocautery. The gallbladder was delivered through the epigastric trocar site using an Endo Catch bag. The gallbladder fossa was inspected and no abnormal bleeding or bile  leakage was noted. Surgicel is placed the gallbladder fossa. All fluid and air were then evacuated from the abdominal cavity prior to removal of the trochars.  All wounds were irrigated with normal saline. All wounds were injected with 0.5% Sensorcaine. The supraumbilical fascia was reapproximated using 0 Vicryl interrupted suture. All skin incisions were closed using staples. Betadine ointment and dry sterile dressings were applied.  All tape and needle counts were correct at the end of the procedure. Patient was extubated in the operating room and transferred to PACU in stable condition.  Complications:  None  EBL:  Minimal  Specimen:  Gallbladder

## 2013-10-07 NOTE — Transfer of Care (Signed)
Immediate Anesthesia Transfer of Care Note  Patient: Brooke [name]  Procedure(s) Performed: Procedure(s): LAPAROSCOPIC CHOLECYSTECTOMY (N/A)  Patient Location: PACU  Anesthesia Type:General  Level of Consciousness: sedated and patient cooperative  Airway & Oxygen Therapy: Patient Spontanous Breathing  Post-op Assessment: Report given to PACU RN and Post -op Vital signs reviewed and stable  Post vital signs: Reviewed and stable  Complications: No apparent anesthesia complications

## 2013-10-07 NOTE — Anesthesia Procedure Notes (Signed)
Procedure Name: Intubation Date/Time: 10/07/2013 8:34 AM Performed by: Pernell Dupre, Rashon Rezek A Pre-anesthesia Checklist: Patient identified, Patient being monitored, Timeout performed, Emergency Drugs available and Suction available Patient Re-evaluated:Patient Re-evaluated prior to inductionOxygen Delivery Method: Circle System Utilized Preoxygenation: Pre-oxygenation with 100% oxygen Intubation Type: IV induction, Rapid sequence and Cricoid Pressure applied Laryngoscope Size: 3 and Miller Grade View: Grade I Tube type: Oral Tube size: 7.0 mm Number of attempts: 1 Airway Equipment and Method: stylet Placement Confirmation: ETT inserted through vocal cords under direct vision,  positive ETCO2 and breath sounds checked- equal and bilateral Secured at: 21 cm Tube secured with: Tape Dental Injury: Teeth and Oropharynx as per pre-operative assessment

## 2013-10-07 NOTE — Anesthesia Postprocedure Evaluation (Signed)
  Anesthesia Post-op Note  Patient: Brooke [name]  Procedure(s) Performed: Procedure(s): LAPAROSCOPIC CHOLECYSTECTOMY (N/A)  Patient Location: PACU  Anesthesia Type:General  Level of Consciousness: sedated and patient cooperative  Airway and Oxygen Therapy: Patient Spontanous Breathing  Post-op Pain: none  Post-op Assessment: Post-op Vital signs reviewed, Patient's Cardiovascular Status Stable, Respiratory Function Stable, Patent Airway, No signs of Nausea or vomiting and Pain level controlled  Post-op Vital Signs: Reviewed and stable  Last Vitals:  Filed Vitals:   10/07/13 0819  BP: 109/48  Pulse: 81  Temp:   Resp: 18    Complications: No apparent anesthesia complications

## 2013-10-07 NOTE — Interval H&P Note (Signed)
History and Physical Interval Note:  10/07/2013 8:04 AM  Brooke [name]  has presented today for surgery, with the diagnosis of cholelithiasis  The various methods of treatment have been discussed with the patient and family. After consideration of risks, benefits and other options for treatment, the patient has consented to  Procedure(s): LAPAROSCOPIC CHOLECYSTECTOMY (N/A) as a surgical intervention .  The patient's history has been reviewed, patient examined, no change in status, stable for surgery.  I have reviewed the patient's chart and labs.  Questions were answered to the patient's satisfaction.     Franky Macho A

## 2013-10-07 NOTE — Discharge Instructions (Signed)

## 2013-10-09 ENCOUNTER — Encounter (HOSPITAL_COMMUNITY): Payer: Self-pay | Admitting: General Surgery

## 2013-12-23 ENCOUNTER — Encounter (HOSPITAL_COMMUNITY): Payer: Self-pay | Admitting: General Surgery

## 2014-02-17 ENCOUNTER — Encounter: Payer: Self-pay | Admitting: *Deleted

## 2014-02-18 ENCOUNTER — Encounter: Payer: Self-pay | Admitting: Obstetrics & Gynecology

## 2014-03-31 DIAGNOSIS — Z87891 Personal history of nicotine dependence: Secondary | ICD-10-CM | POA: Diagnosis not present

## 2014-03-31 DIAGNOSIS — Z7951 Long term (current) use of inhaled steroids: Secondary | ICD-10-CM | POA: Insufficient documentation

## 2014-03-31 DIAGNOSIS — Z79899 Other long term (current) drug therapy: Secondary | ICD-10-CM | POA: Insufficient documentation

## 2014-03-31 DIAGNOSIS — Z91040 Latex allergy status: Secondary | ICD-10-CM | POA: Insufficient documentation

## 2014-03-31 DIAGNOSIS — G8929 Other chronic pain: Secondary | ICD-10-CM | POA: Diagnosis not present

## 2014-03-31 DIAGNOSIS — R531 Weakness: Secondary | ICD-10-CM | POA: Insufficient documentation

## 2014-03-31 DIAGNOSIS — R4701 Aphasia: Secondary | ICD-10-CM | POA: Diagnosis present

## 2014-03-31 DIAGNOSIS — Z7952 Long term (current) use of systemic steroids: Secondary | ICD-10-CM | POA: Diagnosis not present

## 2014-03-31 DIAGNOSIS — F419 Anxiety disorder, unspecified: Secondary | ICD-10-CM | POA: Diagnosis not present

## 2014-04-01 ENCOUNTER — Encounter (HOSPITAL_COMMUNITY): Payer: Self-pay | Admitting: *Deleted

## 2014-04-01 ENCOUNTER — Emergency Department (HOSPITAL_COMMUNITY)
Admission: EM | Admit: 2014-04-01 | Discharge: 2014-04-01 | Disposition: A | Discharge status: Home / Self Care | Payer: No Typology Code available for payment source | Attending: Emergency Medicine | Admitting: Emergency Medicine

## 2014-04-01 DIAGNOSIS — R531 Weakness: Secondary | ICD-10-CM

## 2014-04-01 NOTE — ED Provider Notes (Signed)
CSN: [id number]     Arrival date & time 03/31/14  2355 History   First MD Initiated Contact with Patient 04/01/14 0135     Chief Complaint  Patient presents with  . Aphasia     (Consider location/radiation/quality/duration/timing/severity/associated sxs/prior Treatment) HPI  Patient reports she has been having slurred speech and difficulty walking intermittently for the past 3 months. She has seen her primary care doctor and had a MRI of the brain done. This was done last week and she does not know the result yet. She states her primary care doctor said he was going to refer her to a psychiatrist but has not yet. She states the episodes come and go and last about 20 seconds. When she has them she feels like her speech is slurred and she has trouble walking. She states she has to concentrate on moving her legs. She is not weak in one extremity. She states in between episodes she is normal. She also states when the episode happens it "starts in my head and goes down to my feet. She states it feels like "nerves in my nerves". She states she does feel off balance with these episodes. She denies dragging her leg or having difficulty using one of her arms. She denies headache, nausea, or vomiting. She states she has blurred vision but she does not like to wear her glasses. She states her doctor started her on Lexapro 2 weeks ago but she did not like the side effects and she stopped it.  PCP Dr Concepcion Elk  Past Medical History  Diagnosis Date  . Anxiety   . Edema   . Chronic back pain    Past Surgical History  Procedure Laterality Date  . Wisdom tooth extraction    . Cholecystectomy N/A 10/07/2013    Procedure: LAPAROSCOPIC CHOLECYSTECTOMY;  Surgeon: Dalia Heading, MD;  Location: [facility];  Service: General;  Laterality: N/A;   Family History  Problem Relation Age of Onset  . Diabetes Mother    History  Substance Use Topics  . Smoking status: Former Smoker    Quit date: 09/16/2007  . Smokeless  tobacco: Never Used  . 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     Allergies  Latex  Home Medications   Prior to Admission medications   Medication Sig Start Date End Date Taking? Authorizing Provider  diclofenac (VOLTAREN) 75 MG EC tablet Take 75 mg by mouth 2 (two) times daily as needed. pain 09/14/13   Historical Provider, MD  ferrous sulfate 325 (65 FE) MG tablet Take 325 mg by mouth every morning.    Historical Provider, MD  fluticasone (FLONASE) 50 MCG/ACT nasal spray Place 2 sprays into the nose daily.    Historical Provider, MD  furosemide (LASIX) 20 MG tablet Take 20 mg by mouth daily.    Historical Provider, MD  hydrocortisone 1 % ointment Apply 1 application topically 2 (two) times daily. 07/26/13   Gilda Crease, MD  hydrOXYzine (ATARAX/VISTARIL) 25 MG tablet Take 1 tablet (25 mg total) by mouth every 6 (six) hours. 07/26/13   Gilda Crease, MD  oxyCODONE-acetaminophen (PERCOCET) 7.5-325 MG per tablet Take 1-2 tablets by mouth every 4 (four) hours as  needed. 10/07/13   Dalia Heading, MD  phentermine 37.5 MG capsule Take 37.5 mg by mouth every morning.    Historical Provider, MD   BP 98/80 mmHg  Pulse 86  Temp(Src) 98 F (36.7 C) (Oral)  Resp 12  Ht  (1.626 m)  Wt 344 lb (156.037 kg)  BMI 59.02 kg/m2  SpO2 100%  LMP 03/01/2014  Vital signs normal   Physical Exam  Constitutional: She is oriented to person, place, and time. She appears well-developed and well-nourished.  Non-toxic appearance. She does not appear ill. No distress.  During the course of our interview patient states she had one of her episodes. I did not notice any significant change in her speech.  HENT:  Head: Normocephalic and atraumatic.  Right Ear: External ear normal.  Left Ear: External ear  normal.  Nose: Nose normal. No mucosal edema or rhinorrhea.  Mouth/Throat: Oropharynx is clear and moist and mucous membranes are normal. No dental abscesses or uvula swelling.  Patient has a lot of fluttering eyelids  Eyes: Conjunctivae and EOM are normal. Pupils are equal, round, and reactive to light.  Neck: Normal range of motion and full passive range of motion without pain. Neck supple.  Cardiovascular: Normal rate, regular rhythm and normal heart sounds.  Exam reveals no gallop and no friction rub.   No murmur heard. Pulmonary/Chest: Effort normal and breath sounds normal. No respiratory distress. She has no wheezes. She has no rhonchi. She has no rales. She exhibits no tenderness and no crepitus.  Abdominal: Soft. Normal appearance and bowel sounds are normal. She exhibits no distension. There is no tenderness. There is no rebound and no guarding.  Musculoskeletal: Normal range of motion. She exhibits no edema or tenderness.  Moves all extremities well.   Neurological: She is alert and oriented to person, place, and time. She has normal strength. No cranial nerve deficit.  Cranial nerves III through XII intact, grips are equal bilaterally, there was no motor weakness noted, there is no pronator drift. Patient's speech was normal.  Skin: Skin is warm, dry and intact. No rash noted. No erythema. No pallor.  Psychiatric: She has a normal mood and affect. Her speech is normal and behavior is normal. Her mood appears not anxious.  Nursing note and vitals reviewed.   ED Course  Procedures (including critical care time)   We discussed follow up with a neurologist. I gave the patient several numbers to call.  Labs Review Labs Reviewed - No data to display  Imaging Review No results found.   TECHNIQUE: MRI of the brain is performed with and without IV administration of 15 mL of Gadovist.   INDICATION: speech disturbance.   FINDINGS: There is no acute infarct by diffusion-weighted  imaging.  Negative for hemorrhage or mass lesion.    There are scattered T2 hyperintensities in the cerebral white matter, periventricular regions and the pons , greater on the left than the right.   There is also some signal abnormality involving the periaqueductal gray matter.  One of lesions, adjacent  to the atrium of the left lateral ventricle, is low signal intensity on T1 and may be an old lacunar infarct.    Major cerebral vessels and dural venous sinuses are patent.  Mild proptosis.  Orbits are otherwise benign.  Scattered paranasal sinus disease and some minimal mastoid mucosal thickening.  Tonsils and adenoids are enlarged.  The nasopharynx is narrowed  because of this.    IMPRESSION:  1.  Scattered T2 hyperintensities in the brain and brainstem.  Do not see any lesions in the corpus callosum or cerebellar peduncles, but the findings could still represent demyelinating disease, versus early white matter microvascular changes.   The  degree of white matter changes would be unusual for her age, but can be seen in the setting of diabetes mellitus, hypertension, collagen vascular disease or other processes affecting the small vessels .   2. And large tonsils and adenoids which have potential for being symptomatic in this patient with regards to partially occluding the superior nasopharynx.      EKG Interpretation None      MDM   Final diagnoses:  Weakness    Plan discharge  Devoria Albe, MD, Franz Dell, MD 04/01/14 [phone]

## 2014-04-01 NOTE — ED Notes (Signed)
Pt c/o slurred speech and trouble with balance x 3 weeks. Pt states she has been seen by her PCP and was told that it could be anxiety related and was put on medication; pt states she has had a MRI done and no results were given

## 2014-04-01 NOTE — ED Notes (Signed)
Pt. Reports intermittent episodes lasting about 20 seconds each of slurred speech. Pt. Reports this has been occuring for several weeks. Pt. Reports intermittent difficulty with balance. Pt. Reports being seen by PCP for same and they stated that it might be anxiety related and placed pt. On antidepressant. Pt. Reports stopping medication due to side effects. Pt. Alert and oriented at this time. No difficulty with speech noted.

## 2014-04-01 NOTE — Discharge Instructions (Signed)
You need to follow up a neurologist to get more evaluation and testing done.

## 2014-04-22 ENCOUNTER — Ambulatory Visit (INDEPENDENT_AMBULATORY_CARE_PROVIDER_SITE_OTHER): Payer: PRIVATE HEALTH INSURANCE | Admitting: Neurology

## 2014-04-22 ENCOUNTER — Encounter: Payer: Self-pay | Admitting: Neurology

## 2014-04-22 VITALS — BP 118/77 | HR 80 | Resp 18 | Ht 65.25 in | Wt 347.6 lb

## 2014-04-22 DIAGNOSIS — H532 Diplopia: Secondary | ICD-10-CM

## 2014-04-22 DIAGNOSIS — R930 Abnormal findings on diagnostic imaging of skull and head, not elsewhere classified: Secondary | ICD-10-CM

## 2014-04-22 DIAGNOSIS — R270 Ataxia, unspecified: Secondary | ICD-10-CM

## 2014-04-22 DIAGNOSIS — R413 Other amnesia: Secondary | ICD-10-CM | POA: Insufficient documentation

## 2014-04-22 DIAGNOSIS — R296 Repeated falls: Secondary | ICD-10-CM

## 2014-04-22 MED ORDER — GABAPENTIN 300 MG PO CAPS: ORAL_CAPSULE | ORAL | Status: DC

## 2014-04-22 NOTE — Progress Notes (Addendum)
[facility]UROLOGY [facility]   Provider:  Melvyn Novas, M D  Referring Provider: Dorrene German, MD Primary Care Physician:  Dorrene German, MD  Chief Complaint  Patient presents with  . NP [place] speech/memory/balance    Rm 11,  Mother, boyfriend    HPI:  Brooke [name] is a 29 y.o. female seen here as a referral  from Dr. Concepcion Elk for memory difficulties, delayed recall , episodic.   Brooke [name] is seen here today for a chief complaint of memory difficulties especially short-term memory. She reports that sometimes she has trouble deciphering information verbally given or read by her but often she understands what it is about and she can just not retain the information given to her. Throughout her life her learning style has been more hands on and she is good at things that were demonstrated to her and that she could copy. The see one, do  One, teach one, Approach. She has spells however only for the last 1-2 month.  Her mother and her boyfriend have both noticed that she is sometimes staring off in the midst of conversation. The patient herself called these her "episodes".  Her speech can become slurred for 10 seconds or shorter, her gaze is empty, the spells are accompanied by sensation of a wave coming over her, burning hot.  She gets diaphoretic. She has been morbidly obese for many years, but she was able to lose 60 pounds. She is borderline diabetic, but did not associate the waves with glucose levels.   There is no family history of seizures, one  Parental half sister is autistic.  Dr. Concepcion Elk send her with a recent MRI brain .  The patient had for the first time a influenza vaccine given to her at the job around September 2015. She believes that her symptoms started not long after that he has all the time muscle aching , proceeding the  Flu shot .    Review of Systems: Out of a complete 14 system review, the patient complains of only the following symptoms, and all  other reviewed systems are negative.  Patient has no history of traumatic brain injuries, has no children of her own, finished high school and 2 years of college training. She has no certified learning disabilities. He reports hot flashes staring spells sometimes sudden slurring of speech lasting for very short time. Memory difficulties. Retention of information difficulties. headchaes frequently in AM, hot flushes, and diplopia. Memory loss, MOCA 24-30.      History   Social History  . Marital Status: Single    Spouse Name: N/A  . Number of Children: N/A  . Years of Education: 2 y colleg   Occupational History  . work    Social History Main Topics  . Smoking status: Former Smoker    Quit date: 09/16/2007  . Smokeless tobacco: Never Used  . Alcohol Use: No  . Drug Use: No     Comment: no drug use since 2010  . Sexual Activity: Yes    Birth Control/ Protection: None   Other Topics Concern  . Not on file   Social History Narrative   Caffeine avg 4 12 ounce / wk.    Family History  Problem Relation Age of Onset  . Diabetes Mother     Past Medical History  Diagnosis Date  . Anxiety   . Edema   . Chronic back pain     Past Surgical History  Procedure Laterality Date  .  Wisdom tooth extraction    . Cholecystectomy N/A 10/07/2013    Procedure: LAPAROSCOPIC CHOLECYSTECTOMY;  Surgeon: Dalia Heading, MD;  Location: [facility];  Service: General;  Laterality: N/A;    Current Outpatient Prescriptions  Medication Sig Dispense Refill  . diclofenac (VOLTAREN) 75 MG EC tablet Take 75 mg by mouth 2 (two) times daily as needed. pain    . ferrous sulfate 325 (65 FE) MG tablet Take 325 mg by mouth every morning.    . fluticasone (FLONASE) 50 MCG/ACT nasal spray Place 2 sprays into the nose daily.    . furosemide (LASIX) 20 MG tablet Take 20 mg by mouth daily.    . hydrocortisone 1 % ointment Apply 1 application topically 2 (two) times daily. 30 g 0  . hydrOXYzine  (ATARAX/VISTARIL) 25 MG tablet Take 1 tablet (25 mg total) by mouth every 6 (six) hours. 30 tablet 0  . oxyCODONE-acetaminophen (PERCOCET) 7.5-325 MG per tablet Take 1-2 tablets by mouth every 4 (four) hours as needed. 50 tablet 0  . phentermine 37.5 MG capsule Take 37.5 mg by mouth every morning.     No current facility-administered medications for this visit.    Allergies as of 04/22/2014 - Review Complete 04/22/2014  Allergen Reaction Noted  . Latex Hives 02/16/2011  . Lexapro [escitalopram oxalate] Other (See Comments) 04/22/2014    Vitals: BP 118/77 mmHg  Pulse 80  Resp 18  Ht 5' 5.25" (1.657 m)  Wt 347 lb 9.6 oz (157.67 kg)  BMI 57.43 kg/m2  LMP 03/01/2014 Last Weight:  Wt Readings from Last 1 Encounters:  04/22/14 347 lb 9.6 oz (157.67 kg)       Last Height:   Ht Readings from Last 1 Encounters:  04/22/14 5' 5.25" (1.657 m)    Physical exam:  General: The patient is awake, alert and appears not in acute distress. The patient is well groomed. Head: Normocephalic, atraumatic. Neck is supple. Mallampati 4  neck circumference:18. Nasal airflow unrestricted, TMJ is  not evident . Retrognathia is seen.  Cardiovascular:  Regular rate and rhythm , without  murmurs or carotid bruit, and without distended neck veins. Respiratory: Lungs are clear to auscultation. Skin:  Without evidence of edema, or rash Trunk: BMI is severley elevated,  and patient has normal posture.  Neurologic exam : The patient is awake and alert, oriented to place and time.   Memory subjective  described as intact. There is a normal attention span & concentration ability.  Speech is fluent without  dysarthria, dysphonia or aphasia. Mood and affect are appropriate.  Cranial nerves: Pupils are equal and briskly reactive to light. Funduscopic exam :  pallor  Left eye  but not  edema.   Diplopia with gaze upward left, seems to arise form a saccadic movement of the left eye.  Extraocular movements  in  vertical and horizontal planes intact and without nystagmus. Visual fields by finger perimetry are intact. Hearing to finger rub intact. Facial sensation intact to fine touch. Facial motor strength is symmetric and tongue and uvula move midline.  Motor exam:  Normal tone, muscle bulk and symmetric, strength in all extremities. Grip strength is preserved.   Sensory:  Fine touch, pinprick and vibration were tested in all extremities.  She reports feeling a burning sensation. Proprioception is normal.  Coordination: Rapid alternating movements in the fingers/hands is normal.  Finger-to-nose maneuver  normal without evidence of ataxia, dysmetria or tremor.  Gait and station: Patient walks without assistive device and is  able unassisted to climb up to the exam table.  Strength within normal limits. Stance is wide based, mainly due to morbid obesity.   Tandem gait is hampered by weight.  Romberg testing is  negative.  Deep tendon reflexes: in the  upper and lower extremities are attenuated . Babinski maneuver response is downgoing.   Assessment:  After physical and neurologic examination, review of laboratory studies, imaging, neurophysiology testing and pre-existing records, assessment is   1)  review of the patient's MRI documents periventricular lesions that appear been shaped or H-shaped these are most likely a sign of multiple sclerosis. It is interesting that the patient only developed symptoms over the last 2 month. Her balance is affected she complains of diplopia but mainly of dysesthesias. She also has memory deficits related or unrelated to these brain lesions. We performed a walker test here today and scored only 24 out of 30 points which is a low score 29 year old high school graduate. There were difficulties with a tri-dimensional cube drawing a clock face with the exact hand placement but she recalled 4 out of 5 words and was fully oriented to place and time could repeat a sentence  completely and subtract sevens from a serial 100.   The patient was advised of the nature of the diagnosed sleep disorder , the treatment options and risks for general a health and wellness arising from not treating the condition. Visit duration was 45 minutes.  I explained to Brooke [name] and her family that I strongly suspect multiple sclerosis but that I need some additional confirmatory tests. I explained the nature of the lesions the size of the lesions and that these do not look like stroke or traumatic brain injury related scars on the brain. It would also be very atypical for her age and gender. In addition I explained the test we will undergo which included visual evoked potential, EEG and oligoclonal bands study. We will meet in 3-4 weeks discussing the results of these tests meanwhile she will start on gabapentin to help with the hot flushes hyperhidrosis and hopefully if these are seizure related events they will be controlled by gabapentin as well.  I will prescribe 300 mg of Neurontin at night time dose. I will order a visual evoked potential and EEG for this patient. I would like her to undergo a spinal tap for oligoclonal bands. I will discuss with my radiologist here in the office the  differential diagnosis for this patient. idifferential diagnosis for this patient which is  presenting multiple sclerosis.   Plan:  Treatment plan and additional workup :  Self pay patient.        Porfirio Mylar Antonae Zbikowski MD  04/22/2014

## 2014-04-22 NOTE — Addendum Note (Signed)
Addended by: Melvyn Novas on: 04/22/2014 05:00 PM   Modules accepted: Orders

## 2014-04-23 ENCOUNTER — Telehealth: Payer: Self-pay | Admitting: Neurology

## 2014-04-23 NOTE — Telephone Encounter (Signed)
Pt is calling she got a message from someone stating she needed to schedule a spinal tap.  She is returning the call to schedule.  Please call and advise.

## 2014-04-23 NOTE — Telephone Encounter (Signed)
Marie with [facility] called back, I had not heard from [facility] as yet. I gave her the # for [facility] to speak to them.  She will call them.

## 2014-04-23 NOTE — Telephone Encounter (Signed)
I called and spoke to pt.  I told her that I did not call her back.  [facility] is where she will get LP done.  ? PA or authorization needed for this.   I told her I would call.  I received call from her insurance asking for CPT code and if PA needed.  I LMVM for Danielle at [facility] for this information.  She is to call me back.

## 2014-04-29 ENCOUNTER — Telehealth: Payer: Self-pay | Admitting: Neurology

## 2014-04-29 NOTE — Telephone Encounter (Addendum)
Patient is calling in regard to spinal tap.   She states that she spoke with Duwayne Heck with [facility] who stated that she is in network so she can have the test there.  Please call.

## 2014-04-29 NOTE — Telephone Encounter (Signed)
Pt to have at [facility], who is in network.

## 2014-04-29 NOTE — Telephone Encounter (Signed)
Pt is calling stating she needs to be scheduled with someone in her network for the Lumbar Puncture.  She states that [facility] is not in her network.  Please call and advise.

## 2014-04-29 NOTE — Telephone Encounter (Signed)
I called pt and she states that [facility] out of network, or cost would be too much for her out of pocket.  Try [facility] for LP.   Has VER and EEG 05-09-14.

## 2014-04-29 NOTE — Telephone Encounter (Signed)
I spoke to pt about having done at [facility].  She had spoken to [place] and she is in network.

## 2014-04-30 ENCOUNTER — Other Ambulatory Visit: Payer: Self-pay | Admitting: Neurology

## 2014-04-30 ENCOUNTER — Ambulatory Visit
Admission: RE | Admit: 2014-04-30 | Discharge: 2014-04-30 | Disposition: A | Discharge status: Home / Self Care | Payer: 59 | Source: Ambulatory Visit | Attending: Neurology | Admitting: Neurology

## 2014-04-30 DIAGNOSIS — H532 Diplopia: Secondary | ICD-10-CM

## 2014-04-30 DIAGNOSIS — R930 Abnormal findings on diagnostic imaging of skull and head, not elsewhere classified: Secondary | ICD-10-CM

## 2014-04-30 DIAGNOSIS — R270 Ataxia, unspecified: Secondary | ICD-10-CM

## 2014-04-30 DIAGNOSIS — R413 Other amnesia: Secondary | ICD-10-CM

## 2014-04-30 DIAGNOSIS — R296 Repeated falls: Secondary | ICD-10-CM

## 2014-04-30 LAB — CSF CELL COUNT WITH DIFFERENTIAL
RBC Count, CSF: 0 cu mm
Tube #: 3
WBC CSF: 4 uL (ref 0–5)

## 2014-04-30 LAB — PROTEIN, CSF: TOTAL PROTEIN, CSF: 18 mg/dL (ref 15–45)

## 2014-04-30 LAB — GLUCOSE, CSF: GLUCOSE CSF: 60 mg/dL (ref 43–76)

## 2014-04-30 NOTE — Progress Notes (Signed)
One tiger-topped tube of blood drawn from right outer AC space for LP labs on second attempt; site unremarkable.  First attempt in left [facility] space with small bruise.  Donell Sievert, RN

## 2014-04-30 NOTE — Discharge Instructions (Signed)

## 2014-05-01 ENCOUNTER — Telehealth: Payer: Self-pay | Admitting: *Deleted

## 2014-05-01 NOTE — Telephone Encounter (Signed)
I called and spoke to pt and relayed that the CSF results so far were normal.  Olig bands and VDRL pending.   Will call when available. Pt verbalized understanding.

## 2014-05-02 LAB — VDRL, CSF: VDRL Quant, CSF: NONREACTIVE

## 2014-05-03 LAB — CSF CULTURE W GRAM STAIN
Gram Stain: NONE SEEN
Organism ID, Bacteria: NO GROWTH

## 2014-05-03 LAB — CSF CULTURE: GRAM STAIN: NONE SEEN

## 2014-05-06 LAB — OLIGOCLONAL BANDS, CSF + SERM

## 2014-05-09 ENCOUNTER — Ambulatory Visit (INDEPENDENT_AMBULATORY_CARE_PROVIDER_SITE_OTHER): Payer: PRIVATE HEALTH INSURANCE | Admitting: Neurology

## 2014-05-09 DIAGNOSIS — R413 Other amnesia: Secondary | ICD-10-CM

## 2014-05-09 DIAGNOSIS — R270 Ataxia, unspecified: Secondary | ICD-10-CM

## 2014-05-09 DIAGNOSIS — R930 Abnormal findings on diagnostic imaging of skull and head, not elsewhere classified: Secondary | ICD-10-CM

## 2014-05-09 DIAGNOSIS — R296 Repeated falls: Secondary | ICD-10-CM

## 2014-05-09 DIAGNOSIS — H532 Diplopia: Secondary | ICD-10-CM | POA: Diagnosis not present

## 2014-05-09 NOTE — Procedures (Signed)
    History:   Brooke [name] is a 29 year old patient with a history of memory disturbance, and abnormal MRI the brain. The patient is being evaluated for possible demyelinating disease.  Description: The visual evoked response test was performed today using 32 x 32 check sizes. The absolute latencies for the N1 and the P100 wave forms were within normal limits bilaterally. The amplitudes for the P100 wave forms were also within normal limits bilaterally. The visual acuity was 20/30 OD and 20/30 OS uncorrected.  Impression:  The visual evoked response test above was within normal limits bilaterally. No evidence of conduction slowing was seen within the anterior visual pathways on either side on today's evaluation.

## 2014-05-12 NOTE — Progress Notes (Signed)
Quick Note:  LMVM for pt that made appt for 05-16-14 1000 to call back and confirm appt to discuss results. ______

## 2014-05-13 ENCOUNTER — Telehealth: Payer: Self-pay | Admitting: *Deleted

## 2014-05-13 NOTE — Telephone Encounter (Signed)
I called and relayed second message to pt about appt set for 05-16-14 at 1000, needs to call back and confirm.  Also LMVM for mother about this as well.  [name], boyfriend, wrong number.

## 2014-05-14 NOTE — Procedures (Signed)
[facility]  EEG (ELECTROENCEPHALOGRAM) REPORT   STUDY DATE:   05-09-14 PATIENT NAME:  Brooke [name], Brooke [name]  MRN:   EEG record [id number]: Medical record #[id number].     ORDERING CLINICIAN: Melvyn Novas, MD   TECHNOLOGIST: Caryn Section TECHNIQUE: Electroencephalogram was recorded utilizing standard 10-20 system of lead placement and reformatted into average and bipolar montages.  RECORDING TIME:  30 minutes  ACTIVATION:  strobe lights and hyperventilation    CLINICAL INFORMATION:  Patient presenting with staring spells, possible small seizures lasting about 30 seconds to 1-1/2 minutes. She also has a rather a sensation of a warm wave coming over her. During the aura she has slurred speech. After the event she has memory difficulties and feels fatigued. The patient also has an abnormal brain MRI and underwent recently cerebral spinal fluid testing with positive  oligoclonal bands.  FINDINGS: The EEG background was noted not to be posterior dominant. There was actually bilaterally parietal slowing with sharp legs reversals over the C4 and C3 electrodes at times over reaching a 4 Hz sharp wave in both temporal, parietal, occipital regions.  Highest amplitude biparietal . EKG remained in normal sinus rhythm.  During hyperventilation, there was slowing and amplitude buildup is noted, no photic entrainment was seen.  Heart rate remained is a normal sinus rhythm of 62 bpm.  Absence seizures?   IMPRESSION:  EEG is a abnormal with 4 Hz sharp wave discharges that seem not to correlate with sleep onset. 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       Melvyn Novas , MD

## 2014-05-14 NOTE — Telephone Encounter (Signed)
LMVM for pt to return call about appt.   

## 2014-05-15 NOTE — Progress Notes (Signed)
Quick Note:  Pt has appt on 05-16-14 at 1000 to discuss results. ______

## 2014-05-15 NOTE — Telephone Encounter (Signed)
I spoke to mother and she stated they confirmed appt.  I LMVM for pt to cancel message I left and we will see her Friday 05-16-14 at 1 0945.

## 2014-05-16 ENCOUNTER — Ambulatory Visit (INDEPENDENT_AMBULATORY_CARE_PROVIDER_SITE_OTHER): Payer: PRIVATE HEALTH INSURANCE | Admitting: Neurology

## 2014-05-16 ENCOUNTER — Encounter: Payer: Self-pay | Admitting: Neurology

## 2014-05-16 ENCOUNTER — Telehealth: Payer: Self-pay | Admitting: Neurology

## 2014-05-16 VITALS — BP 116/78 | HR 75 | Resp 16 | Wt 350.4 lb

## 2014-05-16 DIAGNOSIS — G35 Multiple sclerosis: Secondary | ICD-10-CM | POA: Insufficient documentation

## 2014-05-16 DIAGNOSIS — R270 Ataxia, unspecified: Secondary | ICD-10-CM

## 2014-05-16 DIAGNOSIS — R260 Ataxic gait: Secondary | ICD-10-CM

## 2014-05-16 MED ORDER — DIMETHYL FUMARATE 120 & 240 MG PO MISC: 120.0000 mg | Freq: Two times a day (BID) | ORAL | Status: DC

## 2014-05-16 NOTE — Telephone Encounter (Signed)
Pt for MS.

## 2014-05-16 NOTE — Progress Notes (Signed)
NEUROLOGY SLEEP [facility]IC   Provider:  Melvyn Novas, M D  Referring Provider: Fleet Contras, MD Primary Care Physician:  Dorrene German, MD  Chief Complaint  Patient presents with  . RV test results    Rm 11, mother, boyfriend, nieces    HPI:  Brooke [name] is a 29 y.o. female seen here as a referral  from Dr. Concepcion Elk for memory difficulties, delayed recall , episodic.   Brooke [name] is seen here today for a chief complaint of memory difficulties especially short-term memory. She reports that sometimes she has trouble deciphering information verbally given or read by her but often she understands what it is about and she can just not retain the information given to her. Throughout her life her learning style has been more hands on and she is good at things that were demonstrated to her and that she could copy. The see one, do  One, teach one, Approach. She has spells however only for the last 1-2 month.  Her mother and her boyfriend have both noticed that she is sometimes staring off in the midst of conversation. The patient herself called these her "episodes".  Her speech can become slurred for 10 seconds or shorter, her gaze is empty, the spells are accompanied by sensation of a wave coming over her, burning hot.  She gets diaphoretic. She has been morbidly obese for many years, but she was able to lose 60 pounds. She is borderline diabetic, but did not associate the waves with glucose levels.   There is no family history of seizures, one  Parental half sister is autistic.  Dr. Concepcion Elk send her with a recent MRI brain .  The patient had for the first time a influenza vaccine given to her at the job around September 2015. She believes that her symptoms started not long after that he has all the time muscle aching , proceeding the  Flu shot .     Interval history from 05-16-14,  Mrs. Brooke [name] underwent several additional tests including a normal review of her MRI of the  brain in the meantime since her initial visit. The patient's MRI showed some perpendicular most likely demyelinating lesions that would be more consistent with MS than with any microvascular disease or migraine related changes to the right matter. This was followed with a CSF test. The spinal tap was collected on 04-30-14 and showed a glucose level of 60 a total protein level of 18 both in normal range and only 4 white blood cells in normal range as well oligoclonal bands were tested and found positive. The lab report stated that the CSF contains more than 5 well defined gamma restriction bands. These were not present in her regular bloodstream. Oligoclonal bands are present in the CSF of about 90% of patients with multiple sclerosis. The opening pressure during the spinal tap was elevated this was also annotated by Dr. Rose Fillers the radiologist. We then also had performed a visual evoked potential test which returned normal. The patient has 20/30 vision in both eyes and an EEG on 05-09-14. The patient reports that she fell asleep during the EEG, Her EEG would then be normal with 4 Hz  wave discharges biparietal , since these  were sleep related.  There was no photic entrainment noted and her heart rate was  rhythmic at 62 bpm.   Review of Systems: Out of a complete 14 system review, the patient complains of only the following symptoms, and all other reviewed systems  are negative.  Patient has no history of traumatic brain injuries, has no children of her own, finished high school and 2 years of college training. She has no certified learning disabilities. He reports hot flashes staring spells sometimes sudden slurring of speech lasting for very short time. Memory difficulties. Retention of information difficulties. headchaes frequently in AM, hot flushes, and diplopia. Memory loss, MOCA 24-30.  Hypersomnia .     History   Social History  . Marital Status: Single    Spouse Name: N/A  . Number of  Children: N/A  . Years of Education: 2 y colleg   Occupational History  . work    Social History Main Topics  . Smoking status: Former Smoker    Quit date: 09/16/2007  . Smokeless tobacco: Never Used  . Alcohol Use: No  . Drug Use: No     Comment: no drug use since 2010  . Sexual Activity: Yes    Birth Control/ Protection: None   Other Topics Concern  . Not on file   Social History Narrative   Caffeine avg 4 12 ounce / wk.    Family History  Problem Relation Age of Onset  . Diabetes Mother   . Colon polyps Mother     hx of cancer  . Other Mother     DDD Lumber, cervical  . Sleep apnea Father     Past Medical History  Diagnosis Date  . Anxiety   . Edema   . Chronic back pain     Past Surgical History  Procedure Laterality Date  . Wisdom tooth extraction    . Cholecystectomy N/A 10/07/2013    Procedure: LAPAROSCOPIC CHOLECYSTECTOMY;  Surgeon: Dalia Heading, MD;  Location: [facility];  Service: General;  Laterality: N/A;    Current Outpatient Prescriptions  Medication Sig Dispense Refill  . cetirizine (ZYRTEC) 10 MG tablet Take 10 mg by mouth daily as needed for allergies.    [name] diclofenac (VOLTAREN) 75 MG EC tablet Take 75 mg by mouth 2 (two) times daily as needed. pain    . ferrous sulfate 325 (65 FE) MG tablet Take 325 mg by mouth every morning.    . fluticasone (FLONASE) 50 MCG/ACT nasal spray Place 2 sprays into the nose daily.    [name] gabapentin (NEURONTIN) 300 MG capsule Take one i after lunch, and one at bed time. po 180 capsule 2  . oxyCODONE-acetaminophen (PERCOCET/ROXICET) 5-325 MG per tablet Take 1-2 tablets by mouth every 4 (four) hours as needed for severe pain.    . phentermine 37.5 MG capsule Take 37.5 mg by mouth every morning.     No current facility-administered medications for this visit.    Allergies as of 05/16/2014 - Review Complete 05/16/2014  Allergen Reaction Noted  . Lexapro [escitalopram oxalate] Other (See Comments) 04/22/2014  . Latex  Hives 02/16/2011    Vitals: BP 116/78 mmHg  Pulse 75  Resp 16  Wt 350 lb 6.4 oz (158.94 kg)  LMP 04/11/2014 Last Weight:  Wt Readings from Last 1 Encounters:  05/16/14 350 lb 6.4 oz (158.94 kg)       Last Height:   Ht Readings from Last 1 Encounters:  04/22/14 5' 5.25" (1.657 m)    Physical exam:  General: The patient is awake, alert and appears not in acute distress. The patient is well groomed. Head: Normocephalic, atraumatic. Neck is supple. Mallampati 4  neck circumference:18. Nasal airflow unrestricted, TMJ is  not evident . Retrognathia is seen.  Cardiovascular:  Regular rate and rhythm , without  murmurs or carotid bruit, and without distended neck veins. Respiratory: Lungs are clear to auscultation. Skin:  Without evidence of edema, or rash Trunk: BMI is severley elevated,  and patient has normal posture.  Neurologic exam : The patient is awake and alert, oriented to place and time.   Memory subjective  described as intact. There is a normal attention span & concentration ability.  Speech is fluent without  dysarthria, dysphonia or aphasia. Mood and affect are appropriate.  Cranial nerves: Pupils are equal and briskly reactive to light. Funduscopic exam :  pallor left eye,  but not edema.   Diplopia with gaze upward left, seems to arise form a saccadic movement of the left eye.  Extraocular movements  in vertical and horizontal planes intact and without nystagmus. Visual fields by finger perimetry are intact. Hearing to finger rub intact. Facial sensation intact to fine touch. Facial motor strength is symmetric and tongue and uvula move midline.  Motor exam:  Normal tone, muscle bulk and symmetric, strength in all extremities.  Grip strength is preserved.   Sensory:  Fine touch, pinprick and vibration were tested in all extremities.  She reports feeling a burning sensation. Proprioception is normal.  Coordination: Rapid alternating movements in the fingers/hands is  normal.  Finger-to-nose maneuver  normal without evidence of ataxia, dysmetria or tremor.  Gait and station: Patient walks without assistive device and is able unassisted to climb up to the exam table.  Strength within normal limits. Stance is wide based, mainly due to morbid obesity.   Tandem gait is hampered by weight.  Romberg testing is  negative.  Deep tendon reflexes: in the  upper and lower extremities are attenuated . Babinski maneuver response is downgoing.   Assessment:  After physical and neurologic examination, review of laboratory studies, imaging, neurophysiology testing and pre-existing records, assessment is   1)  review of the patient's MRI documents periventricular lesions that appear been shaped or H-shaped these are most likely a sign of multiple sclerosis. It is interesting that the patient only developed symptoms over the last 2 month. Her balance is affected she complains of diplopia but mainly of dysesthesias. She also has memory deficits related or unrelated to these brain lesions. We performed a walker test here today and scored only 24 out of 30 points which is a low score  For a 29 year old high school graduate. There were difficulties with a tri-dimensional cube drawing a clock face with the exact hand placement but she recalled 4 out of 5 words and was fully oriented to place and time could repeat a sentence completely and subtract sevens from a serial 100.  VEP,normal , EEG normal. And oligoclonals seen at more than 5 bands.   The patient was advised of the nature of the diagnosed disorder MS, the treatment options and risks for general a health and wellness arising from not treating the condition. Visit duration was 45 minutes.  I explained to Brooke [name] and her family that I strongly recommend  Treatment of multiple sclerosis . The patient recalls that about 2-1/2 years ago she first had visual changes that she believed were related to needing new glasses. This  may actually have been the first symptom at that time in retrospect. I discussed with her the treatment options which include immunomodulation therapy by subcutaneous injection there are 5 different injection modalities available there is no longer a need for daily injections there either every other  day or even every other week. #2 all her medication in pill form Gilenya, Tecfidera, or abagio discussed the benefits. #3 IV monoclonal antibody therapy which I would not choose as the first line of therapy. Brooke [name] is a this time more leaning towards an oral medication. Is also strongly more convenient doesn't require cooling and doesn't impose any restrictions with air travel etc. Advised her that she will needs a reliable form of birth control, several of our patients use a nova ring or barrier contraception. The oral MS medications can be teratogenic. She does not have any children of her own at this time. In case the patient plans a pregnancy I would take her off medication and follow her closely for the duration of pregnancy but I would like for her not to have an unplanned pregnancy while on medication.  i added an ANA test after the patiet reported several spontaneous abortions.   Plan:  Treatment plan and additional workup : Self pay patient.      Porfirio Mylar Caralyn Twining MD  05/16/2014

## 2014-05-17 LAB — COMPREHENSIVE METABOLIC PANEL
ALK PHOS: 73 IU/L (ref 39–117)
ALT: 11 IU/L (ref 0–32)
AST: 9 IU/L (ref 0–40)
Albumin/Globulin Ratio: 1.2 (ref 1.1–2.5)
Albumin: 3.7 g/dL (ref 3.5–5.5)
BUN/Creatinine Ratio: 17 (ref 8–20)
BUN: 14 mg/dL (ref 6–20)
Bilirubin Total: <0.2 mg/dL (ref 0.0–1.2)
CALCIUM: 8.9 mg/dL (ref 8.7–10.2)
CO2: 26 mmol/L (ref 18–29)
Chloride: 102 mmol/L (ref 97–108)
Creatinine, Ser: 0.81 mg/dL (ref 0.57–1.00)
GFR calc Af Amer: 116 mL/min/{1.73_m2} (ref >59)
GFR calc non Af Amer: 101 mL/min/{1.73_m2} (ref >59)
Globulin, Total: 3.1 g/dL (ref 1.5–4.5)
Glucose: 101 mg/dL — ABNORMAL HIGH (ref 65–99)
Potassium: 4.5 mmol/L (ref 3.5–5.2)
Sodium: 141 mmol/L (ref 134–144)
Total Protein: 6.8 g/dL (ref 6.0–8.5)

## 2014-05-17 LAB — ANA W/REFLEX IF POSITIVE
Anti JO-1: <0.2 AI (ref 0.0–0.9)
Anti Nuclear Antibody(ANA): POSITIVE — AB
Centromere Ab Screen: <0.2 AI (ref 0.0–0.9)
Chromatin Ab SerPl-aCnc: 0.2 AI (ref 0.0–0.9)
ENA RNP AB: 0.2 AI (ref 0.0–0.9)
ENA SSB (LA) Ab: <0.2 AI (ref 0.0–0.9)
Scleroderma SCL-70: <0.2 AI (ref 0.0–0.9)
dsDNA Ab: 20 IU/mL — ABNORMAL HIGH (ref 0–9)

## 2014-05-19 ENCOUNTER — Telehealth: Payer: Self-pay | Admitting: *Deleted

## 2014-05-19 NOTE — Telephone Encounter (Signed)
I called pt and she thinks she may have gotten message from [employer] something (she was not sure).  [employer]?  I told her that form was faxed to our pharm tech and hopefully would hear something soon.  She would call back if needed.

## 2014-05-19 NOTE — Telephone Encounter (Signed)
Patient states that at her last visit with Dr Vickey Huger, she was supposed to start her on Tecfidera, patient wants to know what the status of getting this medication.

## 2014-05-20 LAB — SPECIMEN STATUS REPORT

## 2014-05-22 NOTE — Progress Notes (Signed)
Quick Note:  I called and relayed the lab results to pt. She has heard from [employer] relating needing more information from [place] then will call her back about her financial responsibility. Pt verbalized understanding. ______

## 2014-05-27 ENCOUNTER — Telehealth: Payer: Self-pay | Admitting: Neurology

## 2014-05-27 ENCOUNTER — Telehealth: Payer: Self-pay | Admitting: *Deleted

## 2014-05-27 DIAGNOSIS — R9089 Other abnormal findings on diagnostic imaging of central nervous system: Secondary | ICD-10-CM

## 2014-05-27 DIAGNOSIS — R834 Abnormal immunological findings in cerebrospinal fluid: Secondary | ICD-10-CM

## 2014-05-27 NOTE — Telephone Encounter (Signed)
Abnormal ANA and double stranded DNA ab ,referral to rheumatology placed. Some auto immune diseases can produce oligoclonal bands and abnormal MRI findings. I want to confirm that this is MS.

## 2014-05-27 NOTE — Telephone Encounter (Signed)
I called and LMVM for pt that her lab JCV antibody is negative (good result).  She is to call back if questionsl

## 2014-05-27 NOTE — Telephone Encounter (Signed)
I called pt and relayed the results below.  Made her aware of referral to rheumatology as per Dr. Oliva Bustard recommendation.  (Dr. Alben Deeds).  Pt verbalized understanding.  (ruling out other autoimmune diseases to confirm MS).  Pt asked for parking placard. Will fill out form and get signature.  Pt to pick up form this afternoon.

## 2014-06-02 ENCOUNTER — Telehealth: Payer: Self-pay | Admitting: Neurology

## 2014-06-02 NOTE — Telephone Encounter (Signed)
Rashad with [employer] @ [phone] ext [phone], stated prior authorization is needed for Tecfidera.  Please call and advise.

## 2014-06-02 NOTE — Telephone Encounter (Signed)
Request has already been sent to ins, and is pending response.  Ref Key: [id number].  I called back to advise.  The extension provided is not valid.

## 2014-06-09 ENCOUNTER — Telehealth: Payer: Self-pay

## 2014-06-09 ENCOUNTER — Telehealth: Payer: Self-pay | Admitting: Neurology

## 2014-06-09 NOTE — Telephone Encounter (Signed)
[facility]) has approved the request for coverage on Tecfidera effective until 06/01/2019 or until the policy changes or is terminated.  Ref # [id number]

## 2014-06-09 NOTE — Telephone Encounter (Signed)
Brooke [name] is calling as she is taking a vitamin Centrum for Women and she would like to know if it is compatible with tecfidera.  Please call.

## 2014-06-09 NOTE — Telephone Encounter (Signed)
I called back.  Patient has spoken with [employer] and they have advised her.

## 2014-06-24 ENCOUNTER — Ambulatory Visit: Payer: 59 | Attending: Neurology

## 2014-06-24 DIAGNOSIS — R531 Weakness: Secondary | ICD-10-CM | POA: Insufficient documentation

## 2014-06-24 DIAGNOSIS — R269 Unspecified abnormalities of gait and mobility: Secondary | ICD-10-CM | POA: Insufficient documentation

## 2014-06-24 DIAGNOSIS — R270 Ataxia, unspecified: Secondary | ICD-10-CM | POA: Insufficient documentation

## 2014-06-24 DIAGNOSIS — R32 Unspecified urinary incontinence: Secondary | ICD-10-CM | POA: Diagnosis not present

## 2014-06-24 NOTE — Therapy (Signed)
[facility] [address] [place], [place], [address] Phone: [phone]   Fax:  [phone]  Physical Therapy Evaluation  Patient Details  Name: Brooke [name] MRN: [id number] Date of Birth: [birth year] Referring Provider:  Melvyn Novas, MD  Encounter Date: 06/24/2014      PT End of Session - 06/24/14 1403    Visit Number 1   Number of Visits 17   Date for PT Re-Evaluation 08/23/14   Authorization Type [employer]   PT Start Time 1101   PT Stop Time 1143   PT Time Calculation (min) 42 min   Equipment Utilized During Treatment Gait belt   Activity Tolerance Patient tolerated treatment well   Behavior During Therapy [facility] for tasks assessed/performed      Past Medical History  Diagnosis Date  . Anxiety   . Edema   . Chronic back pain     Past Surgical History  Procedure Laterality Date  . Wisdom tooth extraction    . Cholecystectomy N/A 10/07/2013    Procedure: LAPAROSCOPIC CHOLECYSTECTOMY;  Surgeon: Dalia Heading, MD;  Location: [facility];  Service: General;  Laterality: N/A;    There were no vitals filed for this visit.  Visit Diagnosis:  Abnormality of gait - Plan: PT plan of care cert/re-cert  Ataxia - Plan: PT plan of care cert/re-cert  Weakness generalized - Plan: PT plan of care cert/re-cert  Incontinence of urine in female - Plan: PT plan of care cert/re-cert      Subjective Assessment - 06/24/14 1112    Subjective Impaired balance, diplopia, fatigue, frequent falls, decreased strength, urinary incontinence   Patient is accompained by: Family member  CJ-boyfriend   Patient Stated Goals Walk better, get legs stronger, "get balance back'   Currently in Pain? Yes   Pain Score 6    Pain Location Back   Pain Orientation Upper;Mid;Lower   Pain Descriptors / Indicators Aching   Pain Type Chronic pain   Pain Onset More than a month ago   Pain Frequency Constant   Aggravating Factors  walking    Pain Relieving Factors rest            [facility] PT Assessment - 06/24/14 1339    Assessment   Medical Diagnosis MS, ataxia   Onset Date 12/24/13   Prior Therapy none   Precautions   Precautions Fall   Restrictions   Weight Bearing Restrictions No   Balance Screen   Has the patient fallen in the past 6 months Yes   How many times? 8   Has the patient had a decrease in activity level because of a fear of falling?  Yes   Is the patient reluctant to leave their home because of a fear of falling?  Yes   Home Environment   Living Enviornment Private residence   Living Arrangements Spouse/significant other   Available Help at Discharge Other (Comment)  boyfriend   Type of Home Mobile home   Home Access Stairs to enter   Entrance Stairs-Number of Steps 6   Entrance Stairs-Rails Left   Home Layout One level   Home Equipment [place] - single point  pt reported cane doesn't work    Additional Comments pt reported she is unable to walk with cane during MS flare up, balance is very impaired during episodes.   Prior Function   Level of Independence Independent with basic ADLs;Independent with gait;Independent with transfers   Vocation Other (comment)  pending disability  Leisure read, watch TV, make bracelets, exercise at [facility] (lifting weights, walking on treadmill, swimming)   Cognition   Overall Cognitive Status Impaired/Different from baseline   Area of Impairment Memory   Memory Comments pt reports she occasionally has issues with short term memory recall   Observation/Other Assessments   Focus on Therapeutic Outcomes (FOTO)  Neuro QOL, LE: 32.8 and FOTO physical fear: 34.   Sensation   Light Touch Impaired by gross assessment   Additional Comments R sided N/T starting at R side of umbilicus and travels distally R foot, and L hand. Intermittent L hand, but R side constant N/T. Decreased R UE/LE light touch.   Coordination   Gross Motor Movements are Fluid and Coordinated Yes   Fine  Motor Movements are Fluid and Coordinated Yes   Posture/Postural Control   Posture/Postural Control Postural limitations   Postural Limitations Rounded Shoulders;Forward head   Tone   Assessment Location Other (comment)  none noted during exam   ROM / Strength   AROM / PROM / Strength AROM;Strength   AROM   Overall AROM  Within functional limits for tasks performed   Strength   Overall Strength Deficits   Overall Strength Comments R UE and L UE/LE WFL. R LE: hip flex: 3+/5, knee flex: 3+/5, knee ext: 4/5, ankle dorsiflex: 4/5. Pt reported R foot drop occurs during MS flare up.   Transfers   Transfers Sit to Stand;Stand to Sit   Sit to Stand 5: Supervision;With upper extremity assist;From chair/3-in-1   Stand to Sit 5: Supervision;With upper extremity assist;To chair/3-in-1   Ambulation/Gait   Ambulation/Gait Yes   Ambulation/Gait Assistance 4: Min guard;5: Supervision   Ambulation/Gait Assistance Details Pt required two standing rest breaks to maintain balance after ambulating 10'. Pt required min guard during turns to ensure safety.    Ambulation Distance (Feet) 100 Feet   Assistive device None   Gait Pattern Step-through pattern;Decreased stride length;Decreased dorsiflexion - right;Ataxic  poor trunk control during turns.   Ambulation Surface Level;Indoor   Gait velocity 2.43ft/sec.  no AD   Balance   Balance Assessed Yes   Static Standing Balance   Static Standing - Balance Support No upper extremity supported   Static Standing - Level of Assistance 4: Min assist;Other (comment)  min guard   Static Standing - Comment/# of Minutes Feet apart for 30 seconds, feet together for 30 seconds with increased postural sway, feet apart with eyes closed for 10 seconds with increased postural sway, unable to perform single stance without min A.   Standardized Balance Assessment   Standardized Balance Assessment Timed Up and Go Test   Timed Up and Go Test   TUG Normal TUG   Normal TUG  (seconds) 11.49  no AD                           PT Education - 06/24/14 1403    Education provided Yes   Education Details PT frequency and duration.    Person(s) Educated Patient   Methods Explanation   Comprehension Verbalized understanding          PT Short Term Goals - 06/24/14 1406    PT SHORT TERM GOAL #1   Title Pt will be independent in HEP to improve the strength, endurance, and balance. Target date: 07/22/14.   Status New   PT SHORT TERM GOAL #2   Title Perform BERG and write goals as appropriate. Target date: 07/22/14.  Status New   PT SHORT TERM GOAL #3   Title Pt will improve gait speed to >/=2.21ft/sec. without AD to ambulate safely in the community. Target date: 07/22/14.   Status New   PT SHORT TERM GOAL #4   Title Pt will ambulate 300' over even terrain with LRAD, at MOD I level to improve functional mobility. Target date: 07/22/14.   Status New           PT Long Term Goals - 06/24/14 1408    PT LONG TERM GOAL #1   Title Pt will verbalize understanding of fall risk strategies and energy conservation strategies to reduce falls risk and to conserve energy. Target date: 08/19/14.   Status New   PT LONG TERM GOAL #2   Title Pt will ambulate 600' over even/uneven terrain with LRAD, at MOD I level  to improve functional mobility. Target date: 08/19/14.   Status New   PT LONG TERM GOAL #3   Title Pt will report no falls over the last 4 weeks to improve safety during ambulation. Target date: 08/19/14.   Status New   PT LONG TERM GOAL #4   Title Pt will report she is able to go swimming and go to [facility] without incontinence, in order to exercise and improve quality of life. Target date; 08/19/14.   Status New   PT LONG TERM GOAL #5   Title Pt will improve Neuro QOL LE scale to imrpove quality of life. Target date: 08/19/14.   Status New               Plan - 06/24/14 1107    Clinical Impression Statement Pt is a pleasant 29 y/o female  presenting to [facility] neuro with impaired balance, history of falls, and decrease endurance and strength. Pt also reports urinary incontinence, which prevents her from leaving the house and exercising. Pt reported she was diagnosed with MS 04/2014, and has not had prior therapy. Pt stated she began having MS symptoms in 2014 and have become progressively worse. Pt occasionally experiences diplopia and difficulty with speech and memory. Balance and walking issues began in 12/2013. Pt reported 8 falls in the last 6 months.  Pt's boyfriend, CJ, present during session.  Pt noted to experience ataxia after 10' of ambulation and required standing rest breaks to maintain balance. PT will not directly address back pain but will monitor closely.   Pt will benefit from skilled therapeutic intervention in order to improve on the following deficits Decreased coordination;Abnormal gait;Impaired sensation;Decreased endurance;Decreased balance;Decreased knowledge of use of DME;Decreased mobility;Decreased strength;Obesity;Other (comment)  incontinence   Rehab Potential Good   PT Frequency 2x / week   PT Duration 8 weeks   PT Treatment/Interventions ADLs/Self Care Home Management;Gait training;Neuromuscular re-education;Stair training;Biofeedback;Functional mobility training;Patient/family education;Therapeutic activities;Electrical Stimulation;Therapeutic exercise;Manual techniques;Energy conservation;DME Instruction;Balance training  strengthening and biofeedback to improve incontinence.   PT Next Visit Plan perform BERG, initiate balance and strengthening HEP.   Consulted and Agree with Plan of Care Patient         Problem List Patient Active Problem List   Diagnosis Date Noted  . MS (multiple sclerosis) 05/16/2014  . Abnormal MRI of head 04/22/2014  . Transient diplopia 04/22/2014  . 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 Spell of memory loss 04/22/2014  . Recurrent falls while walking 04/22/2014    Miller,Jennifer  L 06/24/2014, 2:16 PM  [facility] [address] [place], [place], [address] Phone: [phone]   Fax:  [phone]  Zerita Boers, PT,DPT 06/24/2014 2:16 PM Phone: [phone] Fax: [phone]

## 2014-07-02 ENCOUNTER — Ambulatory Visit (INDEPENDENT_AMBULATORY_CARE_PROVIDER_SITE_OTHER): Payer: 59 | Admitting: Neurology

## 2014-07-02 ENCOUNTER — Encounter: Payer: Self-pay | Admitting: Neurology

## 2014-07-02 VITALS — BP 115/70 | HR 80 | Ht 63.0 in | Wt 348.0 lb

## 2014-07-02 DIAGNOSIS — R768 Other specified abnormal immunological findings in serum: Secondary | ICD-10-CM

## 2014-07-02 MED ORDER — TRAMADOL HCL 50 MG PO TABS: 50.0000 mg | ORAL_TABLET | Freq: Two times a day (BID) | ORAL | Status: DC | PRN

## 2014-07-02 NOTE — Progress Notes (Signed)
[facility]   Provider:  Melvyn Novas, M D  Referring Provider: Fleet Contras, MD Primary Care Physician:  Dorrene German, MD  Chief Complaint  Patient presents with  . Follow-up    rm 10, MS and memory, with boyfriend and mother    HPI:  Brooke [name] is a 29 y.o. female seen here as a referral  from Dr. Concepcion Elk for memory difficulties, delayed recall , episodic.   Brooke [name] is seen here today for a chief complaint of memory difficulties especially short-term memory. She reports that sometimes she has trouble deciphering information verbally given or read by her but often she understands what it is about and she can just not retain the information given to her. Throughout her life her learning style has been more hands on and she is good at things that were demonstrated to her and that she could copy. The see one, do  One, teach one, Approach. She has spells however only for the last 1-2 month.  Her mother and her boyfriend have both noticed that she is sometimes staring off in the midst of conversation. The patient herself called these her "episodes". Her speech can become slurred for 10 seconds or shorter, her gaze is empty, the spells are accompanied by sensation of a wave coming over her, burning hot.  She gets diaphoretic. She has been morbidly obese for many years, but she was able to lose 60 pounds. She is borderline diabetic, but did not associate the waves with glucose levels.  There is no family history of seizures, one  Parental half sister is autistic.  Dr. Concepcion Elk send her with a recent MRI brain .  The patient had for the first time a influenza vaccine given to her at the job around September 2015. She believes that her symptoms started not long after that he has all the time muscle aching , proceeding the  Flu shot .  Interval history from 05-16-14, [name] underwent several additional tests including a  review of her MRI of the brain (in  the meantime since her initial visit).  The patient's MRI showed some perpendicular most likely demyelinating lesions that would be more consistent with MS than with any microvascular disease or migraine related changes to the right matter. This was followed with a CSF test. The spinal tap was collected on 04-30-14 and showed a glucose level of 60 a total protein level of 18 both in normal range and only 4 white blood cells in normal range as well oligoclonal bands were tested and found positive. The lab report stated that the CSF contains more than 5 well defined gamma restriction bands. These were not present in her regular bloodstream. Oligoclonal bands are present in the CSF of about 90% of patients with multiple sclerosis. The opening pressure during the spinal tap was elevated this was also annotated by Dr. Rose Fillers the radiologist. We then also had performed a visual evoked potential test which returned normal. The patient has 20/30 vision in both eyes and an EEG on 05-09-14. The patient reports that she fell asleep during the EEG, Her EEG would then be normal with 4 Hz  wave discharges biparietal , since these  were sleep related.  There was no photic entrainment noted and her heart rate was  rhythmic at 62 bpm.  Interval history today from [id number] red live is seen here today. The patient had a positive a and a lab test on 05-27-14 I have therefore referred  her to a rheumatologist for further workup. She has an appointment on May 16. My concern is that oligoclonal bands could be provoked by an autoimmune disease other than multiple sclerosis. However her ANA nuclear antibody was positive and rather high double-stranded DNA was 20, normal is  below 5. Types was diagnosed and there was no narrative obtained from [employer]. I have heard her slurred speech come and go, her balance change and I am not sure if there is a functional component. She is in a lot of pain, which is atypical for MS. Her PCP won't prescribe  her pain medication. She may respond to ibuprofen , advil. I will give her some tramadol for 12 days.     Review of Systems: Out of a complete 14 system review, the patient complains of only the following symptoms, and all other reviewed systems are negative.  Patient has no history of traumatic brain injuries, has no children of her own, finished high school and 2 years of college training. She has no certified learning disabilities.  He reports hot flashes staring spells sometimes sudden slurring of speech lasting for very short time.  Morbidly obese.  Memory difficulties.  Retention of information difficulties.  headchaes frequently in AM, hot flushes, and diplopia.  Memory loss; her MOCA was in  March 24-30.  Hypersomnia .\muscle sorness     History   Social History  . Marital Status: Single    Spouse Name: N/A  . Number of Children: N/A  . Years of Education: 2 y colleg   Occupational History  . work    Social History Main Topics  . Smoking status: Former Smoker    Quit date: 09/16/2007  . Smokeless tobacco: Never Used  . Alcohol Use: No  . Drug Use: No     Comment: no drug use since 2010  . Sexual Activity: Yes    Birth Control/ Protection: None   Other Topics Concern  . Not on file   Social History Narrative   Drinks 1-2 large glasses of caffeine daily.          Family History  Problem Relation Age of Onset  . Diabetes Mother   . Colon polyps Mother     hx of cancer  . Other Mother     DDD Lumber, cervical  . Sleep apnea Father     Past Medical History  Diagnosis Date  . Anxiety   . Edema   . Chronic back pain     Past Surgical History  Procedure Laterality Date  . Wisdom tooth extraction    . Cholecystectomy N/A 10/07/2013    Procedure: LAPAROSCOPIC CHOLECYSTECTOMY;  Surgeon: Dalia Heading, MD;  Location: [facility];  Service: General;  Laterality: N/A;    Current Outpatient Prescriptions  Medication Sig Dispense Refill  . cetirizine  (ZYRTEC) 10 MG tablet Take 10 mg by mouth daily as needed for allergies.    [name] diclofenac (VOLTAREN) 75 MG EC tablet Take 75 mg by mouth 2 (two) times daily as needed. pain    . Dimethyl Fumarate 120 & 240 MG MISC Take 120 mg by mouth 2 times daily at 12 noon and 4 pm. 30 each 0  . ferrous sulfate 325 (65 FE) MG tablet Take 325 mg by mouth every morning.    . fluticasone (FLONASE) 50 MCG/ACT nasal spray Place 2 sprays into the nose daily.    [name] gabapentin (NEURONTIN) 300 MG capsule Take one i after lunch, and one at  bed time. po 180 capsule 2  . oxyCODONE-acetaminophen (PERCOCET/ROXICET) 5-325 MG per tablet Take 1-2 tablets by mouth every 4 (four) hours as needed for severe pain.    . phentermine 37.5 MG capsule Take 37.5 mg by mouth every morning.     No current facility-administered medications for this visit.    Allergies as of 07/02/2014 - Review Complete 07/02/2014  Allergen Reaction Noted  . Lexapro [escitalopram oxalate] Other (See Comments) 04/22/2014  . Latex Hives 02/16/2011    Vitals: BP 115/70 mmHg  Pulse 80  Ht 5\' 3"  (1.6 m)  Wt 348 lb (157.852 kg)  BMI 61.66 kg/m2 Last Weight:  Wt Readings from Last 1 Encounters:  07/02/14 348 lb (157.852 kg)       Last Height:   Ht Readings from Last 1 Encounters:  07/02/14 5\' 3"  (1.6 m)    Physical exam:  General: The patient is awake, alert and appears not in acute distress. The patient is well groomed. Head: Normocephalic, atraumatic. Neck is supple. Mallampati 4  neck circumference:18. Nasal airflow unrestricted, TMJ is  not evident . Retrognathia is seen.  Cardiovascular:  Regular rate and rhythm , without  murmurs or carotid bruit, and without distended neck veins. Respiratory: Lungs are clear to auscultation. Skin:  Without evidence of edema, or rash Trunk: BMI is severley elevated,  and patient has normal posture.  Neurologic exam : The patient is awake and alert, oriented to place and time.   Memory subjective   described as intact. There is a normal attention span & concentration ability.  Speech is fluent without dysarthria, dysphonia or aphasia. Mood and affect are appropriate.  Cranial nerves: Pupils are equal and briskly reactive to light. Funduscopic exam :  pallor left eye,  but not edema.   Diplopia with gaze upward left, seems to arise form a saccadic movement of the left eye.  Extraocular movements  in vertical and horizontal planes intact and without nystagmus. Visual fields by finger perimetry are intact. Hearing to finger rub intact. Facial sensation intact to fine touch.  motor strength is symmetric and tongue and uvula move midline.  Motor exam:  Normal tone, muscle bulk and symmetric, strength in all extremities.  Grip strength is preserved.  Sensory:  Fine touch, pinprick and vibration were tested in all extremities.  She reports feeling a burning sensation.  Soreness, Proprioception is normal. Coordination: Rapid alternating movements in the fingers/hands is normal. Finger-to-nose maneuver  normal without evidence of ataxia, dysmetria or tremor. Gait and station: Patient walks without assistive device and is able unassisted to climb up to the exam table.  Strength within normal limits. Stance is wide based, mainly due to morbid obesity.  Tandem gait is hampered by weight.  Romberg testing is  negative. Deep tendon reflexes: in the upper and lower extremities are attenuated  Babinski maneuver response is downgoing.   Assessment:  After physical and neurologic examination, review of laboratory studies, imaging, neurophysiology testing and pre-existing records, assessment is   1)  review of the patient's MRI documents periventricular lesions that appear been shaped or H-shaped these are most likely a sign of multiple sclerosis. It is interesting that the patient only developed symptoms over the last 2 month. Her balance is affected she complains of diplopia but mainly of dysesthesias.  She also has memory deficits related or unrelated to these brain lesions. We performed a walker test here today and scored only 24 out of 30 points which is a low score  For a 29 year old high school graduate. There were difficulties with a tri-dimensional cube drawing a clock face with the exact hand placement but she recalled 4 out of 5 words and was fully oriented to place and time could repeat a sentence completely and subtract sevens from a serial 100.  VEP,normal , EEG normal.  oligoclonals seen at more than 5 bands. ANA positive, double stranded.   The patient was advised of the diagnosis auto immune disorder  the treatment options and risks for general a health and wellness arising from not treating the condition. Visit duration was 25 minutes.  Within normal result of a double-stranded ANA I am concerned that there is a lupus component or other autoimmune disease component and have therefore referred her to the rheumatologist, with whom she has an appointment on 07-07-14.  I will be happy to refill Neurontin, I advised her to try Advil and Motrin to treat the soreness, some of my patients do better with Tylenol. In addition I will be happy to give her some tramadol that she could use if Advil or Motrin have failed her.   Plan:  Treatment plan and additional workup : Self pay patient.      Porfirio Mylar Kamaria Lucia MD  07/02/2014

## 2014-07-09 ENCOUNTER — Ambulatory Visit: Payer: 59

## 2014-07-11 ENCOUNTER — Ambulatory Visit: Payer: 59

## 2014-07-15 ENCOUNTER — Ambulatory Visit: Payer: 59

## 2014-07-16 ENCOUNTER — Ambulatory Visit: Payer: 59

## 2014-07-23 ENCOUNTER — Ambulatory Visit: Payer: No Typology Code available for payment source

## 2014-07-25 ENCOUNTER — Ambulatory Visit: Payer: No Typology Code available for payment source

## 2014-07-29 ENCOUNTER — Ambulatory Visit: Payer: No Typology Code available for payment source | Admitting: Physical Therapy

## 2014-08-01 ENCOUNTER — Ambulatory Visit: Payer: No Typology Code available for payment source

## 2014-08-05 ENCOUNTER — Ambulatory Visit: Payer: No Typology Code available for payment source | Admitting: Physical Therapy

## 2014-08-08 ENCOUNTER — Telehealth: Payer: Self-pay | Admitting: *Deleted

## 2014-08-08 ENCOUNTER — Ambulatory Visit: Payer: No Typology Code available for payment source

## 2014-08-08 NOTE — Telephone Encounter (Signed)
LMVM for pt at her home/cell # that cancelling appt for 08-11-14 since seen last 07-02-14 with Dr. Vickey Huger and to see again in 3 mon.  Appt was set 09/2014 with MM/NP.  Pt to call back if needed.

## 2014-08-11 ENCOUNTER — Ambulatory Visit: Payer: PRIVATE HEALTH INSURANCE | Admitting: Nurse Practitioner

## 2014-08-13 ENCOUNTER — Ambulatory Visit: Payer: No Typology Code available for payment source

## 2014-08-13 DIAGNOSIS — Z0289 Encounter for other administrative examinations: Secondary | ICD-10-CM

## 2014-08-14 ENCOUNTER — Ambulatory Visit: Payer: No Typology Code available for payment source | Admitting: Physical Therapy

## 2014-08-19 ENCOUNTER — Ambulatory Visit: Payer: No Typology Code available for payment source | Admitting: Physical Therapy

## 2014-08-22 ENCOUNTER — Ambulatory Visit: Payer: No Typology Code available for payment source

## 2014-08-27 ENCOUNTER — Telehealth: Payer: Self-pay | Admitting: Neurology

## 2014-08-27 ENCOUNTER — Ambulatory Visit: Payer: No Typology Code available for payment source

## 2014-08-27 NOTE — Telephone Encounter (Signed)
I called [employer].  Spoke with Annabelle Harman.  They work with [employer] to offer "bridge" supply of meds while patient is trying to get assistance.  She said unfortunately, they are unable to proceed with the Rx until [employer] sends them an auth to dispense 2 weeks at no charge.  Says they will reach out to [employer], and contact us for Rx once this Berkley Harvey is obtained.  Also, says the correct fax number is [phone].  Indicates nothing further is needed from our office at this time, and they will contact us when they are able to take Rx.

## 2014-08-27 NOTE — Telephone Encounter (Signed)
Patient contacted Heather with [employer] to get assistance with Tecfidera.  Heather/[employer]  is calling to get a 2 weeks script for Tecfidera for patient.  Please fax script  to [employer] [facility] @888 [phone]. Phone # is [phone]. Herbert Seta shared that the patient stated she doesn't see any improvement since using Tecfidera since starting 06/06/14. Herbert Seta stated she didn't know if doctor needed to call her to discuss.  Thanks!

## 2014-08-29 ENCOUNTER — Ambulatory Visit: Payer: No Typology Code available for payment source

## 2014-08-29 ENCOUNTER — Telehealth: Payer: Self-pay | Admitting: Neurology

## 2014-08-29 NOTE — Telephone Encounter (Signed)
Patient called reying her disabililty had been denied. She is in the process of appealing of it and states she needs a letter stating she can not work due to [employer]. Please call and advise. Patient can be reached at [phone]. Patient was advised Dr Vickey Huger will return to office on Monday.

## 2014-09-05 NOTE — Telephone Encounter (Signed)
I will need to see her to fill any new papers. She needs to see a rhuematologist for work up.

## 2014-09-05 NOTE — Telephone Encounter (Addendum)
I called and Dr. Shawnee Knapp office closed.  [phone].  I spoke to [place].  She saw Dr. Dierdre Forth on 08-11-14 (approx).  Will need to get note.    She stated she does not have lupus (per Dr. Dierdre Forth).  Other labwork was deferred due to no insurance (and it would be costly).  I told her that I would get notes from his office and then give to Dr. Vickey Huger.   I did relay that Dr. Vickey Huger did say that would need to see for any other p/w to be filled out.   Has appt 10-02-14 with MM/NP.

## 2014-09-08 NOTE — Telephone Encounter (Signed)
Notes received.  Placed in Dr. Oliva Bustard inbox.

## 2014-09-08 NOTE — Telephone Encounter (Signed)
LMVM for Brooke [name], MR at Dr. Shawnee Knapp office to fax ofv note from her referral visit.

## 2014-09-09 NOTE — Telephone Encounter (Signed)
Dr. Vickey Huger reviewed the rheumatologist notes. She said that we need to see the pt in the office again before her paperwork to be filled out. This was relayed to pt by Alverda Skeans, RN on 09/05/14.

## 2014-09-15 ENCOUNTER — Telehealth: Payer: Self-pay | Admitting: Neurology

## 2014-09-15 NOTE — Telephone Encounter (Signed)
Patient called requesting to be seen earlier as the Tysabri is not working. Please call and advise. Patient has upcoming appt with [facility] 09/29/14. Patient can be reached at [phone].

## 2014-09-16 NOTE — Telephone Encounter (Signed)
Spoke to Brooke [name] about sooner appt. She says the Tecfidera is not working and wants to talk to Dr. Vickey Huger about her disability form. Made an appt with Brooke [name] on 8/3 at 11:00. Brooke [name] verbalized understanding to arrive 15 minutes early to appt.

## 2014-09-23 ENCOUNTER — Telehealth: Payer: Self-pay

## 2014-09-23 NOTE — Telephone Encounter (Signed)
Spoke to pt about r/s her 8/3 11:00 appt to 8:30. Pt agreeable and verbalized understanding to arrive at 8:15.

## 2014-09-24 ENCOUNTER — Ambulatory Visit: Payer: Self-pay | Admitting: Neurology

## 2014-09-24 ENCOUNTER — Other Ambulatory Visit: Payer: Self-pay

## 2014-09-24 ENCOUNTER — Telehealth: Payer: Self-pay

## 2014-09-24 DIAGNOSIS — G35 Multiple sclerosis: Secondary | ICD-10-CM

## 2014-09-24 MED ORDER — DIMETHYL FUMARATE 120 & 240 MG PO MISC: 120.0000 mg | Freq: Two times a day (BID) | ORAL | Status: DC

## 2014-09-24 NOTE — Telephone Encounter (Signed)
Pt was over 20 minutes late for her appt with Dr. Vickey Huger today. Dr. Vickey Huger agreed to work in the pt on Monday at 8:00. Both myself and Marylu Lund, Production designer, theatre/television/film, spoke to pt in the lobby and advised her that she needed to be here by 7:45 for an 8:00 appt. Pt and family members verbalized understanding.

## 2014-09-29 ENCOUNTER — Ambulatory Visit (INDEPENDENT_AMBULATORY_CARE_PROVIDER_SITE_OTHER): Payer: 59 | Admitting: Neurology

## 2014-09-29 ENCOUNTER — Telehealth: Payer: Self-pay

## 2014-09-29 ENCOUNTER — Ambulatory Visit: Payer: Self-pay | Admitting: Adult Health

## 2014-09-29 ENCOUNTER — Encounter: Payer: Self-pay | Admitting: Neurology

## 2014-09-29 VITALS — BP 124/72 | HR 88 | Resp 20 | Ht 63.0 in | Wt 351.0 lb

## 2014-09-29 DIAGNOSIS — G379 Demyelinating disease of central nervous system, unspecified: Secondary | ICD-10-CM | POA: Diagnosis not present

## 2014-09-29 DIAGNOSIS — R839 Unspecified abnormal finding in cerebrospinal fluid: Secondary | ICD-10-CM | POA: Diagnosis not present

## 2014-09-29 DIAGNOSIS — G35 Multiple sclerosis: Secondary | ICD-10-CM

## 2014-09-29 HISTORY — DX: Multiple sclerosis: G35

## 2014-09-29 NOTE — Progress Notes (Signed)
[facility]UROLOGY [facility]   Provider:  Melvyn [name], M D  Referring Provider: Fleet Contras, MD Primary Care Physician:  Brooke German, MD  Chief Complaint  Patient presents with  . Follow-up    Tecfidera is not working, hot flashes "worse", walking is "worse", wants a disability letter to get on medicaid and with food assistance, rm 10, with boyfriend    HPI:  Brooke [name] is a 29 y.o. female seen here as a referral  from Dr. Concepcion [name] for memory difficulties, delayed recall , episodic.   Brooke [name] is seen here today for a chief complaint of memory difficulties - especially short-term memory. She reports that sometimes she has trouble deciphering information verbally or read by her but often she understands and she can just not retain the information given to her.  Throughout her life her learning style has been more hands on and she is good at things that were demonstrated to her and that she could copy. The see one, do  One, teach one, Approach. She has spells however only for the last 1-2 month.  Her mother and her boyfriend have both noticed that she is sometimes staring off in the midst of conversation. The patient herself called these her "episodes". Her speech can become slurred for 10 seconds or shorter, her gaze is empty, the spells are accompanied by sensation of a wave coming over her, burning hot.  She gets diaphoretic. She has been morbidly obese for many years, but she was able to lose 60 pounds. She is borderline diabetic, but did not associate the waves with glucose levels.  There is no family history of seizures, one  Parental half sister is autistic. Dr. Concepcion [name] send her with a recent MRI brain .  The patient had for the first time a influenza vaccine given to her at the job around September 2015. She believes that her symptoms started not long after that he has all the time muscle aching , proceeding the  Flu shot .  Interval history from 05-16-14, Mrs.  Brooke [name] underwent several additional tests including a  review of her MRI of the brain (in the meantime since her initial visit). The patient's MRI showed some perpendicular most likely demyelinating lesions that would be more consistent with MS than with any microvascular disease or migraine related changes to the right matter.  This was followed with a CSF test. The spinal tap was collected on 04-30-14 and showed a glucose level of 60 a total protein level of 18 both in normal range and only 4 white blood cells in normal range as well oligoclonal bands were tested and found positive. The lab report stated that the CSF contains more than 5 well defined gamma restriction bands. These were not present in her regular bloodstream. Oligoclonal bands are present in the CSF of about 90% of patients with multiple sclerosis. The opening pressure during the spinal tap was elevated this was also annotated by Dr. Rose [name] the radiologist. We then also had performed a visual evoked potential test which returned normal.  The patient has 20/30 vision in both eyes and an EEG on 05-09-14. she fell asleep during the EEG, her EEG would  be normal with 4 Hz  wave discharges biparietal , since these  were sleep related.  There was no photic entrainment noted and her heart rate was  rhythmic at 62 bpm.  Interval history today from [id number] red live is seen here today. The patient had a positive ANA  test on 05-27-14 I have therefore referred her to a rheumatologist for further workup. She has an appointment on May 16. My concern is that oligoclonal bands could be provoked by an autoimmune disease other than multiple sclerosis. However her ANA nuclear antibody was positive and rather high double-stranded DNA was 20, normal is  below 5. Types was diagnosed and there was no narrative obtained from [employer]. I have heard her slurred speech come and go, her balance change and I am not sure if there is a functional component. She is in a lot  of pain, which is atypical for MS. Her PCP won't prescribe her pain medication. She may respond to ibuprofen , advil. I will give her some tramadol for 12 days. Within normal result of a double-stranded ANA I am concerned that there is a lupus component or other autoimmune disease component and have therefore referred her to the rheumatologist, with whom she has an appointment on 07-07-14.  I will be happy to refill Neurontin, I advised her to try Advil and Motrin to treat the soreness, some of my patients do better with Tylenol. In addition I will be happy to give her some tramadol that she could use if Advil or Motrin have failed her.   Interval history 09-29-14 Brooke [name] is here today and reports that she has not felt better since being on Tacfidera, I asked her to start taking the taxidermist applesauce and that seems to combat the hot flushes the past. The hot flashes a side effect of the medication but a side effect of her disease. A rheumatological workup has been incomplete since she is uninsured at this time and the test cannot be performed. Given her brain MRI and 5 oligoclonal bands in her CSF she is treated for multiple sclerosis which is the very likely diagnosis in her case. 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 Her speech changed as she recalls, she "talked funny " , has vision changes.  I will refer to neuro-ophthalmology and  Physical medicine for evaluation of physical limits, and to help define her disability. I attribute some  of her limited movements to her morbid obesity.   I        Review of Systems: Out of a complete 14 system review, the patient complains of only the following symptoms, and all other reviewed systems are negative.  Patient has no history of traumatic brain injuries, has no children of her own, finished high school and 2 years of  college training. She has no certified learning disabilities.  He reports hot flashes staring spells sometimes sudden slurring of speech lasting for very short time.  Morbidly obese.  Memory difficulties- Retention of information difficult.  headchaes frequently in AM, hot flushes, and diplopia.  Memory loss; her MOCA was in March 2016 at 24-30.  Hypersomnia .\muscle soreness     History   Social History  . Marital Status: Single    Spouse Name: N/A  . Number of Children: N/A  . Years of Education: 2 y colleg   Occupational History  . work    Social History Main Topics  . Smoking status: Former Smoker    Quit date: 09/16/2007  . Smokeless tobacco: Never Used  . Alcohol Use: No  . Drug Use: No     Comment: no drug use since 2010  . Sexual Activity: Yes  Birth Control/ Protection: None   Other Topics Concern  . Not on file   Social History Narrative   Drinks 1-2 large glasses of caffeine daily.          Family History  Problem Relation Age of Onset  . Diabetes Mother   . Colon polyps Mother     hx of cancer  . Other Mother     DDD Lumber, cervical  . Sleep apnea Father     Past Medical History  Diagnosis Date  . Anxiety   . Edema   . Chronic back pain     Past Surgical History  Procedure Laterality Date  . Wisdom tooth extraction    . Cholecystectomy N/A 10/07/2013    Procedure: LAPAROSCOPIC CHOLECYSTECTOMY;  Surgeon: Dalia Heading, MD;  Location: [facility];  Service: General;  Laterality: N/A;    Current Outpatient Prescriptions  Medication Sig Dispense Refill  . cetirizine (ZYRTEC) 10 MG tablet Take 10 mg by mouth daily as needed for allergies.    [name] diclofenac (VOLTAREN) 75 MG EC tablet Take 75 mg by mouth 2 (two) times daily as needed. pain    . Dimethyl Fumarate 120 & 240 MG MISC Take 120 mg by mouth 2 times daily at 12 noon and 4 pm. 30 each 0  . ferrous sulfate 325 (65 FE) MG tablet Take 325 mg by mouth every morning.    . fluticasone  (FLONASE) 50 MCG/ACT nasal spray Place 2 sprays into the nose daily.    [name] gabapentin (NEURONTIN) 300 MG capsule Take one i after lunch, and one at bed time. po 180 capsule 2  . oxyCODONE-acetaminophen (PERCOCET/ROXICET) 5-325 MG per tablet Take 1-2 tablets by mouth every 4 (four) hours as needed for severe pain.    . phentermine 37.5 MG capsule Take 37.5 mg by mouth every morning.    . traMADol (ULTRAM) 50 MG tablet Take 1 tablet (50 mg total) by mouth every 12 (twelve) hours as needed. 30 tablet 1   No current facility-administered medications for this visit.    Allergies as of 09/29/2014 - Review Complete 09/29/2014  Allergen Reaction Noted  . Lexapro [escitalopram oxalate] Other (See Comments) 04/22/2014  . Latex Hives 02/16/2011    Vitals: BP 124/72 mmHg  Pulse 88  Resp 20  Ht  (1.6 m)  Wt 351 lb (159.213 kg)  BMI 62.19 kg/m2 Last Weight:  Wt Readings from Last 1 Encounters:  09/29/14 351 lb (159.213 kg)       Last Height:   Ht Readings from Last 1 Encounters:  09/29/14  (1.6 m)    Physical exam:  General: The patient is awake, alert and appears not in acute distress. The patient is well groomed. Head: Normocephalic, atraumatic. Neck is supple. Mallampati 4  neck circumference:18. Nasal airflow unrestricted, TMJ is  not evident. Retrognathia is seen.  Cardiovascular:  Regular rate and rhythm , without  murmurs or carotid bruit, and without distended neck veins. Respiratory: Lungs are clear to auscultation. Skin:  Without evidence of edema, or rash Trunk: BMI is severley elevated,  and patient has normal posture.  Neurologic exam : The patient is awake and alert, oriented to place and time.   Memory subjective  described as intact. There is a normal attention span & concentration ability.  Speech is fluent without dysarthria, dysphonia or aphasia. Mood and affect are appropriate.  Cranial nerves: Pupils are equal and briskly reactive to light. Funduscopic  exam :  pallor  left eye,  but not edema.   Diplopia with gaze upward left, seems to arise form a saccadic movement of the left eye.  Extraocular movements  in vertical and horizontal planes intact and without nystagmus. Visual fields by finger perimetry are intact. Hearing to finger rub intact. Facial sensation intact to fine touch.  motor strength is symmetric and tongue and uvula move midline.  Motor exam:  Normal tone, muscle bulk and symmetric, strength in all extremities.  Grip strength is preserved.  Sensory:  Fine touch, pinprick and vibration were tested in all extremities.  She reports feeling a burning sensation.  Soreness, Proprioception is normal. Coordination: Rapid alternating movements in the fingers/hands is normal. Finger-to-nose maneuver normal without evidence of ataxia, dysmetria or tremor. Gait and station: Patient walks without assistive device and is able unassisted to climb up to the exam table.  Strength within normal limits. Stance is wide based, mainly due to morbid obesity.  Tandem gait is hampered by weight.  Romberg testing is  negative. Deep tendon reflexes: in the upper and lower extremities are attenuated  Babinski maneuver response is downgoing.   Assessment:  After physical and neurologic examination, review of laboratory studies, imaging, neurophysiology testing and pre-existing records, assessment is   1)  review of the patient's MRI documents periventricular lesions that appear bean shaped , these are most likely a sign of multiple sclerosis.    It is interesting that the patient only developed symptoms over the last 2 month.  Her balance is affected she complains of diplopia but mainly of dysesthesias. She also has memory deficits related or unrelated to these brain lesions. We performed a walker test here today and scored only 24 out of 30 points which is a low score for a 29 year old high school graduate with an associates degree..  There were  difficulties with a tri-dimensional cube drawing a clock face with the exact hand placement but she recalled 4 out of 5 words and was fully oriented to place and time could repeat a sentence completely and subtract sevens from a serial 100.   VEP,normal , EEG normal.   Oligoclonals seen at more than 5 bands.  ANA positive, double stranded.   The patient was advised of the diagnosis auto immune disorder the treatment options and risks for general a health and wellness arising from not treating the condition. Visit duration was 25 minutes.    Plan:  Treatment plan and additional workup : Self pay patient.      Porfirio Mylar Julitza Rickles MD  09/29/2014

## 2014-09-29 NOTE — Telephone Encounter (Signed)
Informed patient she has appointment with [facility] 11/03/2014 @1 :00 pt verbalized  Understanding

## 2014-09-29 NOTE — Patient Instructions (Addendum)
Has no medicaid and no disability at this time- very difficult to work up. Waiting for appeal.   Multiple Sclerosis Multiple sclerosis (MS) is a disease of the central nervous system. It leads to the loss of the insulating covering of the nerves (myelin sheath) of your brain. When this happens, brain signals do not get sent properly or may not get sent at all. The age of onset of MS varies.  CAUSES The cause of MS is unknown. However, it is more common in the [place] than in the [place]. RISK FACTORS There is a higher number of women with MS than men. MS is not an illness that is passed down to you from your family members (inherited). However, your risk of MS is higher if you have a relative with MS. SIGNS AND SYMPTOMS  The symptoms of MS occur in episodes or attacks. These attacks may last weeks to months. There may be long periods of almost no symptoms between attacks. The symptoms of MS vary. This is because of the many different ways it affects the central nervous system. The main symptoms of MS include:  Vision problems and eye pain.  Numbness.  Weakness.  Inability to move your arms, hands, feet, or legs (paralysis).  Balance problems.  Tremors. DIAGNOSIS  Your health care provider can diagnose MS with the help of imaging exams and lab tests. These may include specialized X-ray exams and spinal fluid tests. The best imaging exam to confirm a diagnosis of MS is an MRI. TREATMENT  There is no known cure for MS, but there are medicines that can decrease the number and frequency of attacks. Steroids are often used for short-term relief. Physical and occupational therapy may also help. There are also many new alternative or complementary treatments available to help control the symptoms of MS. Ask your health care provider if any of these other options are right for you. HOME CARE INSTRUCTIONS   Take medicines as directed by your health care  provider.  Exercise as directed by your health care provider. SEEK MEDICAL CARE IF: You begin to feel depressed. SEEK IMMEDIATE MEDICAL CARE IF:  You develop paralysis.  You have problems with bladder, bowel, or sexual function.  You develop mental changes, such as forgetfulness or mood swings.  You have a period of uncontrolled movements (seizure). Document Released: 02/05/2000 Document Revised: 02/12/2013 Document Reviewed: 10/15/2012 [facility] Patient Information 2015 [place], [place]. This information is not intended to replace advice given to you by your health care provider. Make sure you discuss any questions you have with your health care provider.

## 2014-09-30 LAB — COMPREHENSIVE METABOLIC PANEL
ALBUMIN: 3.8 g/dL (ref 3.5–5.5)
ALK PHOS: 74 IU/L (ref 39–117)
ALT: 14 IU/L (ref 0–32)
AST: 15 IU/L (ref 0–40)
Albumin/Globulin Ratio: 1.2 (ref 1.1–2.5)
BUN/Creatinine Ratio: 12 (ref 8–20)
BUN: 8 mg/dL (ref 6–20)
Bilirubin Total: 0.2 mg/dL (ref 0.0–1.2)
CO2: 26 mmol/L (ref 18–29)
Calcium: 9 mg/dL (ref 8.7–10.2)
Chloride: 99 mmol/L (ref 97–108)
Creatinine, Ser: 0.67 mg/dL (ref 0.57–1.00)
GFR calc Af Amer: 140 mL/min/{1.73_m2} (ref >59)
GFR calc non Af Amer: 122 mL/min/{1.73_m2} (ref >59)
GLOBULIN, TOTAL: 3.3 g/dL (ref 1.5–4.5)
GLUCOSE: 115 mg/dL — AB (ref 65–99)
POTASSIUM: 4.8 mmol/L (ref 3.5–5.2)
Sodium: 138 mmol/L (ref 134–144)
Total Protein: 7.1 g/dL (ref 6.0–8.5)

## 2014-09-30 LAB — CBC WITH DIFFERENTIAL/PLATELET
BASOS: 0 %
Basophils Absolute: 0 10*3/uL (ref 0.0–0.2)
EOS (ABSOLUTE): 0.1 10*3/uL (ref 0.0–0.4)
Eos: 3 %
Hematocrit: 36.5 % (ref 34.0–46.6)
Hemoglobin: 12.3 g/dL (ref 11.1–15.9)
Immature Grans (Abs): 0 10*3/uL (ref 0.0–0.1)
Immature Granulocytes: 0 %
LYMPHS: 25 %
Lymphocytes Absolute: 1.4 10*3/uL (ref 0.7–3.1)
MCH: 27.6 pg (ref 26.6–33.0)
MCHC: 33.7 g/dL (ref 31.5–35.7)
MCV: 82 fL (ref 79–97)
MONOCYTES: 7 %
Monocytes Absolute: 0.4 10*3/uL (ref 0.1–0.9)
NEUTROS ABS: 3.5 10*3/uL (ref 1.4–7.0)
Neutrophils: 65 %
PLATELETS: 259 10*3/uL (ref 150–379)
RBC: 4.45 x10E6/uL (ref 3.77–5.28)
RDW: 15.1 % (ref 12.3–15.4)
WBC: 5.5 10*3/uL (ref 3.4–10.8)

## 2014-10-02 ENCOUNTER — Telehealth: Payer: Self-pay

## 2014-10-02 ENCOUNTER — Ambulatory Visit: Payer: Self-pay | Admitting: Adult Health

## 2014-10-02 NOTE — Telephone Encounter (Signed)
-----   Message from Melvyn Novas, MD sent at 10/01/2014  7:02 PM EDT ----- Normal non fast metabolic panel. Please call. CD

## 2014-10-02 NOTE — Telephone Encounter (Signed)
I called and relayed to pt that her results were normal. Pt verbalized understanding to call us for any questions or concerns.

## 2014-10-06 DIAGNOSIS — Z0271 Encounter for disability determination: Secondary | ICD-10-CM

## 2014-10-09 NOTE — Therapy (Signed)
[facility] [address] [place], [place], [address] Phone: [phone]   Fax:  [phone]  Patient Details  Name: Brooke [name] MRN: [id number] Date of Birth: 04/30/1985 Referring Provider:  No ref. provider found  Encounter Date: 10/09/2014 PHYSICAL THERAPY DISCHARGE SUMMARY  Visits from Start of Care: 1  Current functional level related to goals / functional outcomes:     PT Short Term Goals - 06/24/14 1406    PT SHORT TERM GOAL #1   Title Pt will be independent in HEP to improve the strength, endurance, and balance. Target date: 07/22/14.   Status New   PT SHORT TERM GOAL #2   Title Perform BERG and write goals as appropriate. Target date: 07/22/14.   Status New   PT SHORT TERM GOAL #3   Title Pt will improve gait speed to >/=2.58f/sec. without AD to ambulate safely in the community. Target date: 07/22/14.   Status New   PT SHORT TERM GOAL #4   Title Pt will ambulate 300' over even terrain with LRAD, at MOD I level to improve functional mobility. Target date: 07/22/14.   Status New         PT Long Term Goals - 06/24/14 1408    PT LONG TERM GOAL #1   Title Pt will verbalize understanding of fall risk strategies and energy conservation strategies to reduce falls risk and to conserve energy. Target date: 08/19/14.   Status New   PT LONG TERM GOAL #2   Title Pt will ambulate 600' over even/uneven terrain with LRAD, at MOD I level  to improve functional mobility. Target date: 08/19/14.   Status New   PT LONG TERM GOAL #3   Title Pt will report no falls over the last 4 weeks to improve safety during ambulation. Target date: 08/19/14.   Status New   PT LONG TERM GOAL #4   Title Pt will report she [facility] to exercise and improve quality of life. Target date; 08/19/14.   Status New   PT LONG TERM GOAL #5   Title Pt will improve Neuro QOL LE scale to  imrpove quality of life. Target date: 08/19/14.   Status New        Remaining deficits: Pt never returned to PT after initial eval.   Education / Equipment: Benefits of PT   Plan: Patient agrees to discharge.  Patient goals were not met. Patient is being discharged due to not returning since the last visit.  ?????        Fatema Rabe L 10/09/2014, 12:56 PM  [place]SCopalis BeachGLe [place] [address]Phone: [phone]  Fax:  [phone]  JGeoffry Paradise PT,DPT 10/09/2014 12:56 PM Phone: [phone]: [phone]

## 2014-10-13 ENCOUNTER — Telehealth: Payer: Self-pay | Admitting: Neurology

## 2014-10-13 DIAGNOSIS — G379 Demyelinating disease of central nervous system, unspecified: Secondary | ICD-10-CM

## 2014-10-13 NOTE — Telephone Encounter (Signed)
Ordered physical therapy evaluation with special attention to urinary incontinence as requested by physical therapist.

## 2014-12-11 ENCOUNTER — Ambulatory Visit: Payer: No Typology Code available for payment source | Admitting: Physical Therapy

## 2014-12-15 ENCOUNTER — Ambulatory Visit: Payer: No Typology Code available for payment source | Attending: Neurology | Admitting: Physical Therapy

## 2014-12-30 ENCOUNTER — Encounter: Payer: Self-pay | Admitting: Adult Health

## 2014-12-30 ENCOUNTER — Ambulatory Visit (INDEPENDENT_AMBULATORY_CARE_PROVIDER_SITE_OTHER): Payer: 59 | Admitting: Adult Health

## 2014-12-30 ENCOUNTER — Ambulatory Visit: Payer: No Typology Code available for payment source | Admitting: Rehabilitation

## 2014-12-30 VITALS — BP 117/78 | HR 76 | Ht 64.0 in | Wt 342.0 lb

## 2014-12-30 DIAGNOSIS — Z5181 Encounter for therapeutic drug level monitoring: Secondary | ICD-10-CM

## 2014-12-30 DIAGNOSIS — G35 Multiple sclerosis: Secondary | ICD-10-CM

## 2014-12-30 NOTE — Progress Notes (Signed)
PATIENT: Brooke [name] DOB: 06/30/1985  REASON FOR VISIT: follow up-multiple sclerosis HISTORY FROM: patient  HISTORY OF PRESENT ILLNESS: Miss Brooke [name] is a 29 year old female with a history of multiple sclerosis. She returns today for follow-up. She is currently on Tecfidera and tolerating it well although she states that she feels that it is "not working." She states that she continues to have the episodes that she had before starting tecfidera. These episodes include slurred speech (at times), jitteriness and dragging the right leg. These episodes do not happen daily but rather intermittently. She also reports that she now has some ringing in the left ear and tremors that affect the hand and leg that are intermittent. Patient denies any new numbness or weakness. She does feel that the weakness in her legs has gotten worse over time. The patient does have a history of bladder incontinence. She reports in the last 2 months she's had some bowel incontinence approximately 1-2 episodes each month. The patient does feel that her vision has changed she feels that the glasses that she has no longer corrects her vision. She did not follow up with neuro-ophthalmology due to insurance. Patient denies any changes with the gait or balance. She does feel that her balance has gotten worse over time. The patient states that her last MRI was in March prior to her diagnosis of MS. She returns today for an evaluation.  HISTORY (DOHMEIER): Brooke [name] is a 29 y.o. female seen here as a referral from Dr. Concepcion Elk for memory difficulties, delayed recall , episodic.   Brooke [name] is seen here today for a chief complaint of memory difficulties - especially short-term memory. She reports that sometimes she has trouble deciphering information verbally or read by her but often she understands and she can just not retain the information given to her.  Throughout her life her learning style has been more hands on  and she is good at things that were demonstrated to her and that she could copy. The see one, do One, teach one, Approach. She has spells however only for the last 1-2 month.  Her mother and her boyfriend have both noticed that she is sometimes staring off in the midst of conversation. The patient herself called these her "episodes". Her speech can become slurred for 10 seconds or shorter, her gaze is empty, the spells are accompanied by sensation of a wave coming over her, burning hot. She gets diaphoretic. She has been morbidly obese for many years, but she was able to lose 60 pounds. She is borderline diabetic, but did not associate the waves with glucose levels.  There is no family history of seizures, one Parental half sister is autistic. Dr. Concepcion Elk send her with a recent MRI brain .  The patient had for the first time a influenza vaccine given to her at the job around September 2015. She believes that her symptoms started not long after that he has all the time muscle aching , proceeding the Flu shot .  Interval history from 05-16-14, Brooke [name] underwent several additional tests including a review of her MRI of the brain (in the meantime since her initial visit). The patient's MRI showed some perpendicular most likely demyelinating lesions that would be more consistent with MS than with any microvascular disease or migraine related changes to the right matter.  This was followed with a CSF test. The spinal tap was collected on 04-30-14 and showed a glucose level of 60 a total protein level  of 18 both in normal range and only 4 white blood cells in normal range as well oligoclonal bands were tested and found positive. The lab report stated that the CSF contains more than 5 well defined gamma restriction bands. These were not present in her regular bloodstream. Oligoclonal bands are present in the CSF of about 90% of patients with multiple sclerosis. The opening pressure during the spinal tap  was elevated this was also annotated by Dr. Rose Fillers the radiologist. We then also had performed a visual evoked potential test which returned normal.  The patient has 20/30 vision in both eyes and an EEG on 05-09-14. she fell asleep during the EEG, her EEG would be normal with 4 Hz wave discharges biparietal , since these were sleep related.  There was no photic entrainment noted and her heart rate was rhythmic at 62 bpm.  Interval history 09-29-14 Brooke [name] is here today and reports that she has not felt better since being on Tacfidera, I asked her to start taking the taxidermist applesauce and that seems to combat the hot flushes the past. The hot flashes a side effect of the medication but a side effect of her disease. A rheumatological workup has been incomplete since she is uninsured at this time and the test cannot be performed. Given her brain MRI and 5 oligoclonal bands in her CSF she is treated for multiple sclerosis which is the very likely diagnosis in her case. 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 Her speech changed as she recalls, she "talked funny " , has vision changes.  I will refer to neuro-ophthalmology and Physical medicine for evaluation of physical limits, and to help define her disability. I attribute some of her limited movements to her morbid obesity.   REVIEW OF SYSTEMS: Out of a complete 14 system review of symptoms, the patient complains only of the following symptoms, and all other reviewed systems are negative.  Fatigue, ear pain, double vision, loss of vision, cough, incontinence of bowels, excessive thirst, excessive eating, incontinence of bladder, frequency of urination, speech difficulty, weakness, tremors, neck stiffness, walking difficulty, aching muscles  ALLERGIES: Allergies  Allergen Reactions  . Lexapro [Escitalopram Oxalate]  Other (See Comments)    Suicidal thoughts, lowered libido  . Latex Hives    HOME MEDICATIONS: Outpatient Prescriptions Prior to Visit  Medication Sig Dispense Refill  . Dimethyl Fumarate 120 & 240 MG MISC Take 120 mg by mouth 2 times daily at 12 noon and 4 pm. 30 each 0  . ferrous sulfate 325 (65 FE) MG tablet Take 325 mg by mouth every morning.    . cetirizine (ZYRTEC) 10 MG tablet Take 10 mg by mouth daily as needed for allergies.    [name] diclofenac (VOLTAREN) 75 MG EC tablet Take 75 mg by mouth 2 (two) times daily as needed. pain    . fluticasone (FLONASE) 50 MCG/ACT nasal spray Place 2 sprays into the nose daily.    [name] gabapentin (NEURONTIN) 300 MG capsule Take one i after lunch, and one at bed time. po (Patient not taking: Reported on 12/30/2014) 180 capsule 2  . oxyCODONE-acetaminophen (PERCOCET/ROXICET) 5-325 MG per tablet Take 1-2 tablets by mouth every 4 (four) hours as needed for severe pain.    . phentermine 37.5 MG capsule Take 37.5 mg by mouth every morning.    . traMADol (ULTRAM) 50 MG tablet  Take 1 tablet (50 mg total) by mouth every 12 (twelve) hours as needed. (Patient not taking: Reported on 12/30/2014) 30 tablet 1   No facility-administered medications prior to visit.    PAST MEDICAL HISTORY: Past Medical History  Diagnosis Date  . Anxiety   . Edema   . Chronic back pain   . MS (multiple sclerosis) (HCC) 09/29/2014    PAST SURGICAL HISTORY: Past Surgical History  Procedure Laterality Date  . Wisdom tooth extraction    . Cholecystectomy N/A 10/07/2013    Procedure: LAPAROSCOPIC CHOLECYSTECTOMY;  Surgeon: Dalia Heading, MD;  Location: [facility];  Service: General;  Laterality: N/A;    FAMILY HISTORY: Family History  Problem Relation Age of Onset  . Diabetes Mother   . Colon polyps Mother     hx of cancer  . Other Mother     DDD Lumber, cervical  . Sleep apnea Father     SOCIAL HISTORY: Social History   Social History  . Marital Status: Single    Spouse  Name: N/A  . Number of Children: N/A  . Years of Education: 2 y colleg   Occupational History  . work    Social History Main Topics  . Smoking status: Former Smoker    Quit date: 09/16/2007  . Smokeless tobacco: Never Used  . Alcohol Use: No  . Drug Use: No     Comment: no drug use since 2010  . Sexual Activity: Yes    Birth Control/ Protection: None   Other Topics Concern  . Not on file   Social History Narrative   Drinks 1-2 large glasses of caffeine daily.            PHYSICAL EXAM  Filed Vitals:   12/30/14 1125  BP: 117/78  Pulse: 76  Height: 5\' 4"  (1.626 m)  Weight: 342 lb (155.13 kg)   Body mass index is 58.68 kg/(m^2).  Generalized: Well developed, in no acute distress   Neurological examination  Mentation: Alert oriented to time, place, history taking. Follows all commands speech and language fluent Cranial nerve II-XII: Pupils were equal round reactive to light. Extraocular movements were full, visual field were full on confrontational test. Facial sensation and strength were normal. Uvula tongue midline. Head turning and shoulder shrug  were normal and symmetric. Motor: The motor testing reveals 5 over 5 strength of all 4 extremities. Good symmetric motor tone is noted throughout.  Sensory: Sensory testing is intact to soft touch on all 4 extremities. No evidence of extinction is noted.  Coordination: Cerebellar testing reveals good finger-nose-finger and heel-to-shin bilaterally.  Gait and station: Gait is normal. Tandem gait is slightly unsteady. Romberg is negative. No drift is seen.  Reflexes: Deep tendon reflexes are symmetric but depressed  DIAGNOSTIC DATA (LABS, IMAGING, TESTING) - I reviewed patient records, labs, notes, testing and imaging myself where available.  Lab Results  Component Value Date   WBC 5.5 09/29/2014   HGB 11.8* 09/27/2013   HCT 36.5 09/29/2014   MCV 80.2 09/27/2013   PLT 263 09/27/2013      Component Value Date/Time    NA 138 09/29/2014 0909   NA 136* 09/27/2013 0254   K 4.8 09/29/2014 0909   CL 99 09/29/2014 0909   CO2 26 09/29/2014 0909   GLUCOSE 115* 09/29/2014 0909   GLUCOSE 107* 09/27/2013 0254   BUN 8 09/29/2014 0909   BUN 10 09/27/2013 0254   CREATININE 0.67 09/29/2014 0909   CALCIUM 9.0 09/29/2014 0909  PROT 7.1 09/29/2014 0909   PROT 7.5 09/27/2013 0254   ALBUMIN 3.8 09/29/2014 0909   ALBUMIN 3.5 09/27/2013 0254   AST 15 09/29/2014 0909   ALT 14 09/29/2014 0909   ALKPHOS 74 09/29/2014 0909   BILITOT 0.2 09/29/2014 0909   BILITOT 0.4 09/27/2013 0254   GFRNONAA 122 09/29/2014 0909   GFRAA 140 09/29/2014 0909      ASSESSMENT AND PLAN 29 y.o. year old female  has a past medical history of Anxiety; Edema; Chronic back pain; and MS (multiple sclerosis) (HCC) (09/29/2014). here with:  1. Multiple sclerosis  The patient does feel that her symptoms have worsened while on Tecifera. She does not report any new symptoms that could be related to MS. I will check blood work today. I will repeat MRI of the brain to look for any progression of her multiple sources. The patient's physical exam today was relatively unremarkable. I have provided the patient with the phone number to the neuro-ophthalmologist to reschedule her appointment. Patient advised that if her symptoms worsen or she develops any new symptoms she shouldl let us know. She will follow-up in 3-4 months with Dr. Vergia Alcon, MSN, NP-C 12/30/2014, 11:35 AM [facility] [address], [address] [place], [place] [address] [phone]

## 2014-12-30 NOTE — Patient Instructions (Signed)
I will check blood work today We will recheck MRI brain. Continue tecfidera.  If your symptoms worsen or you develop new symptoms please let us know.

## 2014-12-31 ENCOUNTER — Telehealth: Payer: Self-pay | Admitting: Adult Health

## 2014-12-31 DIAGNOSIS — R296 Repeated falls: Secondary | ICD-10-CM

## 2014-12-31 LAB — CBC WITH DIFFERENTIAL/PLATELET
BASOS ABS: 0 10*3/uL (ref 0.0–0.2)
Basos: 0 %
EOS (ABSOLUTE): 0.1 10*3/uL (ref 0.0–0.4)
Eos: 2 %
HEMOGLOBIN: 12.2 g/dL (ref 11.1–15.9)
Hematocrit: 35.4 % (ref 34.0–46.6)
IMMATURE GRANS (ABS): 0 10*3/uL (ref 0.0–0.1)
IMMATURE GRANULOCYTES: 0 %
LYMPHS: 35 %
Lymphocytes Absolute: 1.5 10*3/uL (ref 0.7–3.1)
MCH: 28.5 pg (ref 26.6–33.0)
MCHC: 34.5 g/dL (ref 31.5–35.7)
MCV: 83 fL (ref 79–97)
MONOCYTES: 5 %
Monocytes Absolute: 0.2 10*3/uL (ref 0.1–0.9)
NEUTROS PCT: 58 %
Neutrophils Absolute: 2.5 10*3/uL (ref 1.4–7.0)
Platelets: 266 10*3/uL (ref 150–379)
RBC: 4.28 x10E6/uL (ref 3.77–5.28)
RDW: 14.3 % (ref 12.3–15.4)
WBC: 4.4 10*3/uL (ref 3.4–10.8)

## 2014-12-31 LAB — COMPREHENSIVE METABOLIC PANEL
A/G RATIO: 1.3 (ref 1.1–2.5)
ALBUMIN: 3.7 g/dL (ref 3.5–5.5)
ALT: 12 IU/L (ref 0–32)
AST: 17 IU/L (ref 0–40)
Alkaline Phosphatase: 68 IU/L (ref 39–117)
BUN / CREAT RATIO: 11 (ref 8–20)
BUN: 7 mg/dL (ref 6–20)
Bilirubin Total: <0.2 mg/dL (ref 0.0–1.2)
CALCIUM: 9.2 mg/dL (ref 8.7–10.2)
CHLORIDE: 101 mmol/L (ref 97–106)
CO2: 26 mmol/L (ref 18–29)
Creatinine, Ser: 0.65 mg/dL (ref 0.57–1.00)
GFR calc Af Amer: 141 mL/min/{1.73_m2} (ref >59)
GFR calc non Af Amer: 122 mL/min/{1.73_m2} (ref >59)
GLOBULIN, TOTAL: 2.9 g/dL (ref 1.5–4.5)
Glucose: 89 mg/dL (ref 65–99)
Potassium: 4.5 mmol/L (ref 3.5–5.2)
SODIUM: 141 mmol/L (ref 136–144)
Total Protein: 6.6 g/dL (ref 6.0–8.5)

## 2014-12-31 MED ORDER — GABAPENTIN 300 MG PO CAPS: ORAL_CAPSULE | ORAL | Status: DC

## 2014-12-31 MED ORDER — TRAMADOL HCL 50 MG PO TABS: 50.0000 mg | ORAL_TABLET | Freq: Two times a day (BID) | ORAL | Status: DC | PRN

## 2014-12-31 NOTE — Telephone Encounter (Signed)
I called and spoke to pt and relayed that the labs drawn were normal.  She verbalized understanding.  I also let her know that message sent to pharmacy tech about her meds.

## 2014-12-31 NOTE — Progress Notes (Signed)
I agree with the assessment and plan as directed by NP .The patient is known to me .   Malana Eberwein, MD  

## 2014-12-31 NOTE — Telephone Encounter (Signed)
-----   Message from Butch Penny, NP sent at 12/31/2014  9:53 AM EST ----- Lab work is normal. Please call patient.

## 2014-12-31 NOTE — Telephone Encounter (Signed)
Pt called sts Megan had inquired of her to call pharmacy to request the number of refills left on gabapentin (NEURONTIN) 300 MG capsule, no refills left and traMADol (ULTRAM) 50 MG tablet no refills. She needs refills on both medications

## 2014-12-31 NOTE — Telephone Encounter (Signed)
Medications refilled

## 2014-12-31 NOTE — Progress Notes (Signed)
I agree with the assessment and plan as directed by NP .The patient is known to me .   Xylon Croom, MD  

## 2015-01-02 ENCOUNTER — Other Ambulatory Visit: Payer: Self-pay | Admitting: Adult Health

## 2015-01-11 ENCOUNTER — Ambulatory Visit
Admission: RE | Admit: 2015-01-11 | Discharge: 2015-01-11 | Disposition: A | Discharge status: Home / Self Care | Payer: Medicaid Other | Source: Ambulatory Visit | Attending: Adult Health | Admitting: Adult Health

## 2015-01-11 DIAGNOSIS — G35 Multiple sclerosis: Secondary | ICD-10-CM | POA: Diagnosis not present

## 2015-01-11 MED ORDER — GADOBENATE DIMEGLUMINE 529 MG/ML IV SOLN
20.0000 mL | Freq: Once | INTRAVENOUS | Status: AC | PRN
  Administered 2015-01-11: 20 mL via INTRAVENOUS

## 2015-01-13 ENCOUNTER — Telehealth: Payer: Self-pay

## 2015-01-13 NOTE — Telephone Encounter (Signed)
-----   Message from Butch Penny, NP sent at 01/13/2015  2:44 PM EST ----- Please call the patient let her know that we received the results but we need the scan compared to previous MRI. I am requesting imaging from her previous MRI and will have Dr. Epimenio Foot review. I will call her once this is available to me.

## 2015-01-13 NOTE — Telephone Encounter (Signed)
Spoke to patient. Gave instruction per previous note.

## 2015-01-21 ENCOUNTER — Telehealth: Payer: Self-pay | Admitting: *Deleted

## 2015-01-21 NOTE — Telephone Encounter (Signed)
error/fim. 

## 2015-01-26 ENCOUNTER — Telehealth: Payer: Self-pay | Admitting: Adult Health

## 2015-01-26 NOTE — Telephone Encounter (Signed)
The patient had resolution of one Foci but the new scan showed an additional 2 foci. I consulted with Dr. Vickey Huger and at this time the patient will remain on tecfidera. Patient verbalized understanding.

## 2015-01-28 ENCOUNTER — Telehealth: Payer: Self-pay | Admitting: Neurology

## 2015-01-28 NOTE — Telephone Encounter (Signed)
Pt called sts she went to Social Service to apply for food stamps. She was told she needs a letter from provider stating why she cannot work, how long she will be out of work. Please call and advise

## 2015-01-29 NOTE — Telephone Encounter (Signed)
Spoke to pt. I advised her that we have never written her out of work so Dr. Vickey Huger wants her PCP to write this letter. Pt verbalized understanding to ask her PCP for this letter.

## 2015-01-29 NOTE — Telephone Encounter (Signed)
I cannot find documentation that [employer] has ever written this patient out of work. Dr. Vickey Huger advised me to advise the pt to ask her PCP for this letter.  Called pt to advise her of this, no answer, left a message asking her to call me back.

## 2015-01-29 NOTE — Telephone Encounter (Signed)
Pt returned Kristen's call. She can be reached at [phone]

## 2015-04-01 ENCOUNTER — Encounter: Payer: Self-pay | Admitting: Neurology

## 2015-04-01 ENCOUNTER — Ambulatory Visit (INDEPENDENT_AMBULATORY_CARE_PROVIDER_SITE_OTHER): Payer: Medicaid Other | Admitting: Neurology

## 2015-04-01 VITALS — BP 140/82 | HR 86 | Resp 20 | Ht 64.0 in | Wt 327.0 lb

## 2015-04-01 DIAGNOSIS — G35 Multiple sclerosis: Secondary | ICD-10-CM | POA: Diagnosis not present

## 2015-04-01 DIAGNOSIS — R296 Repeated falls: Secondary | ICD-10-CM

## 2015-04-01 MED ORDER — DIMETHYL FUMARATE 120 & 240 MG PO MISC: ORAL | Status: DC

## 2015-04-01 MED ORDER — GABAPENTIN 300 MG PO CAPS: ORAL_CAPSULE | ORAL | Status: DC

## 2015-04-01 NOTE — Addendum Note (Signed)
Addended by: Geronimo Running A on: 04/01/2015 10:16 AM   Modules accepted: Orders
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
## 2015-04-07 ENCOUNTER — Telehealth: Payer: Self-pay

## 2015-04-07 NOTE — Telephone Encounter (Signed)
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  Pt is currently on tecfidera, and tysabri was discussed pending JCV results.  Keep pt on Tecfidera or change to another therapy?

## 2015-04-07 NOTE — Telephone Encounter (Signed)
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 I will continue tacfidera and discuss with Dr Epimenio Foot if another infusion medication can be used. CD

## 2015-04-08 NOTE — Telephone Encounter (Signed)
I will give the patient a call. Thanks Angie

## 2015-04-08 NOTE — Telephone Encounter (Signed)
Spoke to Brooke [name] and advised her that her JCV came back positive and that Dr. Vickey Huger said to continue tecfidera and Dr. Vickey Huger will discuss with Dr. Epimenio Foot if another infusion medication can be used. Brooke [name] verbalized understanding.  Brooke [name] is asking me about a bill from Medicaid she received that told her she needed to pay the bill from our office first. I advised her that I would send her question to the billing department. Brooke [name] verbalized understanding.

## 2015-04-14 ENCOUNTER — Encounter: Payer: Self-pay | Admitting: Neurology

## 2015-04-15 ENCOUNTER — Telehealth: Payer: Self-pay

## 2015-04-15 NOTE — Telephone Encounter (Signed)
-----   Message from Melvyn Novas, MD sent at 04/15/2015  5:12 PM EST ----- postive for JCvirus, not a Tysabri candidate. CD may be a candidate for newer injectable or infused medication. I will speak to Dr Epimenio Foot CD

## 2015-04-15 NOTE — Telephone Encounter (Signed)
Called no answer and no vm 

## 2015-04-16 NOTE — Telephone Encounter (Signed)
Pt has already been advised of her JCV result. If pt calls back, please advise her to disregard call.

## 2015-04-27 NOTE — Telephone Encounter (Addendum)
Pt called said [employer] needs PA for tecfidera. Pt said she has about 10 days left.  Pt was also transferred to Angie VM to discuss insurance issues.

## 2015-05-04 ENCOUNTER — Telehealth: Payer: Self-pay | Admitting: Neurology

## 2015-05-04 DIAGNOSIS — G35 Multiple sclerosis: Secondary | ICD-10-CM

## 2015-05-04 DIAGNOSIS — R296 Repeated falls: Secondary | ICD-10-CM

## 2015-05-04 NOTE — Telephone Encounter (Signed)
Patient returned Kristen's call °

## 2015-05-04 NOTE — Telephone Encounter (Signed)
This is a continuation from telephone call 04/07/15. I spoke to pt and advised her that I tried to complete the pa for tecfidera and both [place] [facility]/Medicaid and [employer] reports that she didn't need a pa. I was instructed by [employer] that pt needs to call Medicaid and find out which specialty pharmacy they need her tecfidera sent. Pt verbalized understanding and will call Medicaid and [employer].

## 2015-05-04 NOTE — Telephone Encounter (Signed)
I called [place] [facility] to initiate pa for tecfidera. Was informed that a pa for tecfidera is not needed. I called [employer] to inform them and they report that they also do not need a pa for tecfidera. Pt needs to contact [place] [employer] and ask which specialty pharmacy she should use to get the tecfidera.  I called pt to discuss. No answer, left a message asking pt to call me back. If pt calls back, please advise her of the above information. Her tecfidera refill was [facility] [place] [facility].

## 2015-05-07 ENCOUNTER — Other Ambulatory Visit: Payer: Self-pay

## 2015-05-07 MED ORDER — DIMETHYL FUMARATE 240 MG PO CPDR: 240.0000 mg | DELAYED_RELEASE_CAPSULE | Freq: Two times a day (BID) | ORAL | Status: DC

## 2015-05-07 NOTE — Telephone Encounter (Signed)
[facility] phone: [phone] is where rx should be sent to

## 2015-05-07 NOTE — Telephone Encounter (Signed)
Pt returned call and was told about rx. She expressed understanding

## 2015-05-07 NOTE — Telephone Encounter (Signed)
Patient called back to advise, Medicaid states patient can't use [facility], correct pharmacy is [facility] [phone]. Fax Rx to [facility] [phone].

## 2015-05-07 NOTE — Addendum Note (Signed)
Addended by: Geronimo Running A on: 05/07/2015 11:44 AM   Modules accepted: Orders

## 2015-05-07 NOTE — Addendum Note (Signed)
Addended by: Geronimo Running A on: 05/07/2015 02:11 PM   Modules accepted: Orders

## 2015-05-07 NOTE — Telephone Encounter (Signed)
I called pt to advise her that the RX for tecfidera was sent to [facility] as requested. No answer, left a message asking her to call me back. If pt calls back, please advise her of this information.

## 2015-05-12 ENCOUNTER — Emergency Department (HOSPITAL_COMMUNITY): Payer: Medicaid Other | Admitting: Anesthesiology

## 2015-05-12 ENCOUNTER — Encounter (HOSPITAL_COMMUNITY)
Admission: EM | Disposition: A | Discharge status: Home / Self Care | Payer: Self-pay | Source: Home / Self Care | Attending: Emergency Medicine

## 2015-05-12 ENCOUNTER — Emergency Department (HOSPITAL_COMMUNITY): Payer: Medicaid Other

## 2015-05-12 ENCOUNTER — Observation Stay (HOSPITAL_COMMUNITY)
Admission: EM | Admit: 2015-05-12 | Discharge: 2015-05-13 | Disposition: A | Discharge status: Home / Self Care | Payer: Medicaid Other | Attending: General Surgery | Admitting: General Surgery

## 2015-05-12 ENCOUNTER — Encounter (HOSPITAL_COMMUNITY): Payer: Self-pay | Admitting: Emergency Medicine

## 2015-05-12 DIAGNOSIS — G8929 Other chronic pain: Secondary | ICD-10-CM | POA: Insufficient documentation

## 2015-05-12 DIAGNOSIS — R1031 Right lower quadrant pain: Secondary | ICD-10-CM | POA: Diagnosis present

## 2015-05-12 DIAGNOSIS — Z79899 Other long term (current) drug therapy: Secondary | ICD-10-CM | POA: Diagnosis not present

## 2015-05-12 DIAGNOSIS — K3580 Unspecified acute appendicitis: Principal | ICD-10-CM | POA: Diagnosis present

## 2015-05-12 DIAGNOSIS — G35 Multiple sclerosis: Secondary | ICD-10-CM | POA: Diagnosis not present

## 2015-05-12 DIAGNOSIS — Z9049 Acquired absence of other specified parts of digestive tract: Secondary | ICD-10-CM | POA: Insufficient documentation

## 2015-05-12 DIAGNOSIS — N939 Abnormal uterine and vaginal bleeding, unspecified: Secondary | ICD-10-CM | POA: Diagnosis not present

## 2015-05-12 DIAGNOSIS — Z87891 Personal history of nicotine dependence: Secondary | ICD-10-CM | POA: Insufficient documentation

## 2015-05-12 DIAGNOSIS — F419 Anxiety disorder, unspecified: Secondary | ICD-10-CM | POA: Diagnosis not present

## 2015-05-12 DIAGNOSIS — Z6841 Body Mass Index (BMI) 40.0 and over, adult: Secondary | ICD-10-CM | POA: Insufficient documentation

## 2015-05-12 HISTORY — PX: LAPAROSCOPIC APPENDECTOMY: SHX408

## 2015-05-12 LAB — CBC WITH DIFFERENTIAL/PLATELET
BASOS ABS: 0 10*3/uL (ref 0.0–0.1)
BASOS PCT: 0 %
Eosinophils Absolute: 0 10*3/uL (ref 0.0–0.7)
Eosinophils Relative: 0 %
HEMATOCRIT: 37.7 % (ref 36.0–46.0)
HEMOGLOBIN: 13 g/dL (ref 12.0–15.0)
Lymphocytes Relative: 14 %
Lymphs Abs: 1.3 10*3/uL (ref 0.7–4.0)
MCH: 29.3 pg (ref 26.0–34.0)
MCHC: 34.5 g/dL (ref 30.0–36.0)
MCV: 85.1 fL (ref 78.0–100.0)
Monocytes Absolute: 0.4 10*3/uL (ref 0.1–1.0)
Monocytes Relative: 4 %
NEUTROS ABS: 7.5 10*3/uL (ref 1.7–7.7)
NEUTROS PCT: 82 %
Platelets: 265 10*3/uL (ref 150–400)
RBC: 4.43 MIL/uL (ref 3.87–5.11)
RDW: 13.6 % (ref 11.5–15.5)
WBC: 9.2 10*3/uL (ref 4.0–10.5)

## 2015-05-12 LAB — URINALYSIS, ROUTINE W REFLEX MICROSCOPIC
Bilirubin Urine: NEGATIVE
Glucose, UA: NEGATIVE mg/dL
KETONES UR: 40 mg/dL — AB
NITRITE: NEGATIVE
PH: 6 (ref 5.0–8.0)
PROTEIN: NEGATIVE mg/dL
Specific Gravity, Urine: 1.02 (ref 1.005–1.030)

## 2015-05-12 LAB — COMPREHENSIVE METABOLIC PANEL
ALBUMIN: 3.7 g/dL (ref 3.5–5.0)
ALK PHOS: 67 U/L (ref 38–126)
ALT: 26 U/L (ref 14–54)
AST: 20 U/L (ref 15–41)
Anion gap: 6 (ref 5–15)
BILIRUBIN TOTAL: 0.5 mg/dL (ref 0.3–1.2)
BUN: 9 mg/dL (ref 6–20)
CALCIUM: 9.1 mg/dL (ref 8.9–10.3)
CO2: 28 mmol/L (ref 22–32)
Chloride: 103 mmol/L (ref 101–111)
Creatinine, Ser: 0.76 mg/dL (ref 0.44–1.00)
GFR calc Af Amer: >60 mL/min (ref >60)
GFR calc non Af Amer: >60 mL/min (ref >60)
GLUCOSE: 123 mg/dL — AB (ref 65–99)
Potassium: 4 mmol/L (ref 3.5–5.1)
Sodium: 137 mmol/L (ref 135–145)
TOTAL PROTEIN: 8.1 g/dL (ref 6.5–8.1)

## 2015-05-12 LAB — URINE MICROSCOPIC-ADD ON

## 2015-05-12 LAB — LIPASE, BLOOD: Lipase: 35 U/L (ref 11–51)

## 2015-05-12 LAB — PREGNANCY, URINE: PREG TEST UR: NEGATIVE

## 2015-05-12 SURGERY — APPENDECTOMY, LAPAROSCOPIC
Anesthesia: General | Site: Abdomen

## 2015-05-12 MED ORDER — KETOROLAC TROMETHAMINE 30 MG/ML IJ SOLN
30.0000 mg | Freq: Once | INTRAMUSCULAR | Status: AC
  Administered 2015-05-12: 30 mg via INTRAVENOUS

## 2015-05-12 MED ORDER — PIPERACILLIN-TAZOBACTAM 3.375 G IVPB
3.3750 g | Freq: Once | INTRAVENOUS | Status: AC
  Administered 2015-05-12: 3.375 g via INTRAVENOUS
  Filled 2015-05-12: qty 50

## 2015-05-12 MED ORDER — GLYCOPYRROLATE 0.2 MG/ML IJ SOLN
INTRAMUSCULAR | Status: AC
  Filled 2015-05-12: qty 6

## 2015-05-12 MED ORDER — PROPOFOL 10 MG/ML IV BOLUS
INTRAVENOUS | Status: DC | PRN
  Administered 2015-05-12: 50 mg via INTRAVENOUS
  Administered 2015-05-12: 150 mg via INTRAVENOUS

## 2015-05-12 MED ORDER — BUPIVACAINE HCL (PF) 0.5 % IJ SOLN
INTRAMUSCULAR | Status: AC
  Filled 2015-05-12: qty 30

## 2015-05-12 MED ORDER — IOHEXOL 300 MG/ML  SOLN
100.0000 mL | Freq: Once | INTRAMUSCULAR | Status: AC | PRN
  Administered 2015-05-12: 100 mL via INTRAVENOUS

## 2015-05-12 MED ORDER — SODIUM CHLORIDE 0.9 % IV SOLN
INTRAVENOUS | Status: DC
  Administered 2015-05-12: 21:00:00 via INTRAVENOUS

## 2015-05-12 MED ORDER — FENTANYL CITRATE (PF) 100 MCG/2ML IJ SOLN
INTRAMUSCULAR | Status: DC | PRN
  Administered 2015-05-12: 25 ug via INTRAVENOUS
  Administered 2015-05-12 (×2): 50 ug via INTRAVENOUS
  Administered 2015-05-12: 25 ug via INTRAVENOUS
  Administered 2015-05-12: 100 ug via INTRAVENOUS
  Administered 2015-05-12: 50 ug via INTRAVENOUS
  Administered 2015-05-12 (×2): 25 ug via INTRAVENOUS

## 2015-05-12 MED ORDER — SODIUM CHLORIDE 0.9 % IV BOLUS (SEPSIS)
1000.0000 mL | Freq: Once | INTRAVENOUS | Status: AC
  Administered 2015-05-12: 1000 mL via INTRAVENOUS

## 2015-05-12 MED ORDER — POVIDONE-IODINE 10 % OINT PACKET
TOPICAL_OINTMENT | CUTANEOUS | Status: DC | PRN
  Administered 2015-05-12: 1 via TOPICAL

## 2015-05-12 MED ORDER — ROCURONIUM BROMIDE 100 MG/10ML IV SOLN
INTRAVENOUS | Status: DC | PRN
  Administered 2015-05-12 (×2): 5 mg via INTRAVENOUS
  Administered 2015-05-12: 25 mg via INTRAVENOUS
  Administered 2015-05-12: 10 mg via INTRAVENOUS

## 2015-05-12 MED ORDER — NEOSTIGMINE METHYLSULFATE 10 MG/10ML IV SOLN
INTRAVENOUS | Status: DC | PRN
  Administered 2015-05-12: 3 mg via INTRAVENOUS

## 2015-05-12 MED ORDER — FENTANYL CITRATE (PF) 250 MCG/5ML IJ SOLN
INTRAMUSCULAR | Status: AC
  Filled 2015-05-12: qty 5

## 2015-05-12 MED ORDER — MIDAZOLAM HCL 2 MG/2ML IJ SOLN
INTRAMUSCULAR | Status: DC | PRN
  Administered 2015-05-12: 2 mg via INTRAVENOUS

## 2015-05-12 MED ORDER — POVIDONE-IODINE 10 % EX OINT
TOPICAL_OINTMENT | CUTANEOUS | Status: AC
  Filled 2015-05-12: qty 1

## 2015-05-12 MED ORDER — ONDANSETRON HCL 4 MG/2ML IJ SOLN
4.0000 mg | Freq: Once | INTRAMUSCULAR | Status: AC
  Administered 2015-05-12: 4 mg via INTRAVENOUS
  Filled 2015-05-12: qty 2

## 2015-05-12 MED ORDER — BUPIVACAINE HCL (PF) 0.5 % IJ SOLN
INTRAMUSCULAR | Status: DC | PRN
  Administered 2015-05-12: 10 mL

## 2015-05-12 MED ORDER — HYDROMORPHONE HCL 1 MG/ML IJ SOLN
0.5000 mg | INTRAMUSCULAR | Status: AC | PRN
  Administered 2015-05-12 (×3): 0.5 mg via INTRAVENOUS
  Filled 2015-05-12 (×3): qty 1

## 2015-05-12 MED ORDER — FENTANYL CITRATE (PF) 100 MCG/2ML IJ SOLN
INTRAMUSCULAR | Status: AC
  Filled 2015-05-12: qty 2

## 2015-05-12 MED ORDER — LACTATED RINGERS IV SOLN
INTRAVENOUS | Status: DC | PRN
  Administered 2015-05-12: 23:00:00 via INTRAVENOUS

## 2015-05-12 MED ORDER — MIDAZOLAM HCL 2 MG/2ML IJ SOLN
INTRAMUSCULAR | Status: AC
  Filled 2015-05-12: qty 2

## 2015-05-12 MED ORDER — GLYCOPYRROLATE 0.2 MG/ML IJ SOLN
INTRAMUSCULAR | Status: DC | PRN
  Administered 2015-05-12: 0.4 mg via INTRAVENOUS

## 2015-05-12 MED ORDER — ONDANSETRON HCL 4 MG/2ML IJ SOLN
INTRAMUSCULAR | Status: AC
  Filled 2015-05-12: qty 2

## 2015-05-12 MED ORDER — ROCURONIUM BROMIDE 50 MG/5ML IV SOLN
INTRAVENOUS | Status: AC
  Filled 2015-05-12: qty 1

## 2015-05-12 MED ORDER — SUCCINYLCHOLINE CHLORIDE 20 MG/ML IJ SOLN
INTRAMUSCULAR | Status: DC | PRN
  Administered 2015-05-12: 140 mg via INTRAVENOUS

## 2015-05-12 MED ORDER — SODIUM CHLORIDE 0.9 % IR SOLN
Status: DC | PRN
  Administered 2015-05-12: 1000 mL

## 2015-05-12 MED ORDER — SUCCINYLCHOLINE CHLORIDE 20 MG/ML IJ SOLN
INTRAMUSCULAR | Status: AC
  Filled 2015-05-12: qty 1

## 2015-05-12 MED ORDER — KETOROLAC TROMETHAMINE 30 MG/ML IJ SOLN
INTRAMUSCULAR | Status: AC
  Filled 2015-05-12: qty 1

## 2015-05-12 MED ORDER — ONDANSETRON HCL 4 MG/2ML IJ SOLN
INTRAMUSCULAR | Status: DC | PRN
  Administered 2015-05-12: 4 mg via INTRAVENOUS

## 2015-05-12 MED ORDER — LIDOCAINE HCL (CARDIAC) 10 MG/ML IV SOLN
INTRAVENOUS | Status: DC | PRN
  Administered 2015-05-12: 50 mg via INTRAVENOUS

## 2015-05-12 MED ORDER — NEOSTIGMINE METHYLSULFATE 10 MG/10ML IV SOLN
INTRAVENOUS | Status: AC
  Filled 2015-05-12: qty 1

## 2015-05-12 MED ORDER — PROPOFOL 10 MG/ML IV BOLUS
INTRAVENOUS | Status: AC
  Filled 2015-05-12: qty 20

## 2015-05-12 MED ORDER — LIDOCAINE HCL (PF) 1 % IJ SOLN
INTRAMUSCULAR | Status: AC
  Filled 2015-05-12: qty 5

## 2015-05-12 SURGICAL SUPPLY — 50 items
BAG HAMPER (MISCELLANEOUS) ×3 IMPLANT
BAG SPEC RTRVL LRG 6X4 10 (ENDOMECHANICALS) ×1
CHLORAPREP W/TINT 26ML (MISCELLANEOUS) ×3 IMPLANT
CLOTH BEACON ORANGE TIMEOUT ST (SAFETY) ×3 IMPLANT
COVER LIGHT HANDLE STERIS (MISCELLANEOUS) ×6 IMPLANT
CUTTER FLEX LINEAR 45M (STAPLE) ×2 IMPLANT
CUTTER LINEAR ENDO 35 ART FLEX (STAPLE) IMPLANT
DECANTER SPIKE VIAL GLASS SM (MISCELLANEOUS) ×3 IMPLANT
ELECT REM PT RETURN 9FT ADLT (ELECTROSURGICAL) ×3
ELECTRODE REM PT RTRN 9FT ADLT (ELECTROSURGICAL) ×1 IMPLANT
EVACUATOR SMOKE 8.L (FILTER) ×3 IMPLANT
FORMALIN 10 PREFIL 120ML (MISCELLANEOUS) ×3 IMPLANT
GLOVE BIOGEL PI IND STRL 7.0 (GLOVE) ×1 IMPLANT
GLOVE BIOGEL PI INDICATOR 7.0 (GLOVE) ×4
GLOVE ECLIPSE 6.5 STRL STRAW (GLOVE) ×2 IMPLANT
GLOVE EXAM NITRILE MD LF STRL (GLOVE) ×6 IMPLANT
GLOVE SURG SS PI 7.5 STRL IVOR (GLOVE) ×3 IMPLANT
GOWN STRL REUS W/ TWL XL LVL3 (GOWN DISPOSABLE) ×1 IMPLANT
GOWN STRL REUS W/TWL LRG LVL3 (GOWN DISPOSABLE) ×5 IMPLANT
GOWN STRL REUS W/TWL XL LVL3 (GOWN DISPOSABLE) ×3
INST SET LAPROSCOPIC AP (KITS) ×3 IMPLANT
IV NS IRRIG 3000ML ARTHROMATIC (IV SOLUTION) IMPLANT
KIT ROOM TURNOVER APOR (KITS) ×3 IMPLANT
MANIFOLD NEPTUNE II (INSTRUMENTS) ×3 IMPLANT
NDL INSUFFLATION 14GA 120MM (NEEDLE) ×1 IMPLANT
NEEDLE INSUFFLATION 14GA 120MM (NEEDLE) ×3 IMPLANT
NS IRRIG 1000ML POUR BTL (IV SOLUTION) ×3 IMPLANT
PACK LAP CHOLE LZT030E (CUSTOM PROCEDURE TRAY) ×3 IMPLANT
PAD ARMBOARD 7.5X6 YLW CONV (MISCELLANEOUS) ×3 IMPLANT
PENCIL HANDSWITCHING (ELECTRODE) ×2 IMPLANT
POUCH SPECIMEN RETRIEVAL 10MM (ENDOMECHANICALS) ×3 IMPLANT
RELOAD 45 VASCULAR/THIN (ENDOMECHANICALS) ×3 IMPLANT
RELOAD STAPLE 45 2.5 WHT GRN (ENDOMECHANICALS) IMPLANT
RELOAD STAPLE 45 3.5 BLU ETS (ENDOMECHANICALS) ×1 IMPLANT
RELOAD STAPLE TA45 3.5 REG BLU (ENDOMECHANICALS) IMPLANT
SET BASIN LINEN APH (SET/KITS/TRAYS/PACK) ×3 IMPLANT
SET TUBE IRRIG SUCTION NO TIP (IRRIGATION / IRRIGATOR) IMPLANT
SHEARS HARMONIC ACE PLUS 36CM (ENDOMECHANICALS) ×3 IMPLANT
SPONGE GAUZE 2X2 8PLY STER LF (GAUZE/BANDAGES/DRESSINGS) ×3
SPONGE GAUZE 2X2 8PLY STRL LF (GAUZE/BANDAGES/DRESSINGS) ×6 IMPLANT
STAPLER VISISTAT (STAPLE) ×5 IMPLANT
SUT VICRYL 0 UR6 27IN ABS (SUTURE) ×3 IMPLANT
TAPE CLOTH SURG 4X10 WHT LF (GAUZE/BANDAGES/DRESSINGS) ×2 IMPLANT
TRAY FOLEY CATH SILVER 16FR (SET/KITS/TRAYS/PACK) ×1 IMPLANT
TROCAR ENDO BLADELESS 11MM (ENDOMECHANICALS) ×3 IMPLANT
TROCAR ENDO BLADELESS 12MM (ENDOMECHANICALS) ×3 IMPLANT
TROCAR XCEL NON-BLD 5MMX100MML (ENDOMECHANICALS) ×3 IMPLANT
TUBING INSUFFLATION (TUBING) ×3 IMPLANT
WARMER LAPAROSCOPE (MISCELLANEOUS) ×3 IMPLANT
YANKAUER SUCT 12FT TUBE ARGYLE (SUCTIONS) ×3 IMPLANT

## 2015-05-12 NOTE — Anesthesia Postprocedure Evaluation (Signed)
Anesthesia Post Note  Patient: Brooke [name]  Procedure(s) Performed: Procedure(s) (LRB): APPENDECTOMY LAPAROSCOPIC (N/A)  Patient location during evaluation: PACU Anesthesia Type: General Level of consciousness: awake Pain management: pain level controlled Vital Signs Assessment: post-procedure vital signs reviewed and stable Respiratory status: spontaneous breathing Cardiovascular status: stable Anesthetic complications: no    Last Vitals:  Filed Vitals:   05/12/15 2300 05/12/15 2315  BP: 142/73 138/74  Pulse: 114 98  Temp:    Resp: 20 21    Last Pain:  Filed Vitals:   05/12/15 2318  PainSc: 5                  Walterine Amodei

## 2015-05-12 NOTE — Op Note (Signed)
Patient:  Brooke [name]  DOB:  04-05-1985  MRN:  [id number]   Preop Diagnosis:  Acute appendicitis  Postop Diagnosis:  Same  Procedure:  Laparoscopic appendectomy  Surgeon:  Franky Macho, M.D.  Anes:  Gen. endotracheal  Indications:  Patient is a 30 year old morbidly obese black female who presents with a less than 24-hour history of worsening right lower quadrant abdominal pain. CT scan the abdomen revealed acute appendicitis with a probable appendicolith. The risks and benefits of the procedure including bleeding, infection, and the possibility of an open procedure were fully explained to the patient, who gave informed consent.  Procedure note:  The patient was placed the supine position. After induction of general endotracheal anesthesia, the abdomen was prepped and draped using the usual sterile technique with DuraPrep. Surgical site confirmation was performed.  A supraumbilical incision was made down to the fascia. A Veress needle was introduced into the abdominal cavity and confirmation of placement was done using the saline drop test. The abdomen was then insufflated to 16 mmHg pressure. An 11 mm trocar was introduced into the abdominal cavity under direct visualization without difficulty. The patient was placed in deeper Trendelenburg position and an additional 12 mm trocar was placed the suprapubic region and a 5 mm trocar was placed left lower quadrant region. The appendix was easily visualized. The appendix was not perforated. The mesoappendix was divided using the harmonic scalpel. A vascular Endo GIA was placed across the base the appendix and fired. The appendix was then removed using an Endo Catch bag without difficulty. The staple line was inspected and noted to the intact. All fluid and air were then evacuated from the abdominal cavity prior to the removal of the trochars.  All wounds were irrigated with normal saline. All wounds were injected with 0.5% Sensorcaine. The  incisions were closed using staples. Betadine ointment and dry sterile dressings were applied.  All tape and needle counts were correct at the end of the procedure. The patient was extubated in the operating room and transferred to PACU in stable condition.  Complications:  None  EBL:  Minimal  Specimen:  Appendix

## 2015-05-12 NOTE — Transfer of Care (Signed)
Immediate Anesthesia Transfer of Care Note  Patient: Brooke [name]  Procedure(s) Performed: Procedure(s): APPENDECTOMY LAPAROSCOPIC (N/A)  Patient Location: PACU  Anesthesia Type:General  Level of Consciousness: sedated and patient cooperative  Airway & Oxygen Therapy: Patient Spontanous Breathing and non-rebreather face mask  Post-op Assessment: Report given to RN, Post -op Vital signs reviewed and stable and Patient moving all extremities  Post vital signs: Reviewed and stable    Complications: No apparent anesthesia complications

## 2015-05-12 NOTE — ED Notes (Signed)
Nauseated for 3 days and started having lower right abd pain yesterday. Denies urinary symptoms.

## 2015-05-12 NOTE — ED Provider Notes (Signed)
CSN: [id number]     Arrival date & time 05/12/15  1527 History   First MD Initiated Contact with Patient 05/12/15 1800     Chief Complaint  Patient presents with  . Abdominal Pain    Patient is a 30 y.o. female presenting with abdominal pain. The history is provided by the patient.  Abdominal Pain Pain location:  RLQ Pain quality: aching   Pain radiates to:  Does not radiate Pain severity:  Severe Duration:  3 days Timing:  Constant Progression:  Worsening Associated symptoms: nausea, vaginal bleeding and vaginal discharge   Associated symptoms: no diarrhea, no fever and no vomiting   LMP Feb 23.  She thinks it might be her appendix. Nothing makes it better or worse.  Past Medical History  Diagnosis Date  . Anxiety   . Edema   . Chronic back pain   . MS (multiple sclerosis) (HCC) 09/29/2014   Past Surgical History  Procedure Laterality Date  . Wisdom tooth extraction    . Cholecystectomy N/A 10/07/2013    Procedure: LAPAROSCOPIC CHOLECYSTECTOMY;  Surgeon: Dalia Heading, MD;  Location: [facility];  Service: General;  Laterality: N/A;   Family History  Problem Relation Age of Onset  . Diabetes Mother   . Colon polyps Mother     hx of cancer  . Other Mother     DDD Lumber, cervical  . Sleep apnea Father    Social History  Substance Use Topics  . Smoking status: Former Smoker    Quit date: 09/16/2007  . Smokeless tobacco: Never Used  . Alcohol Use: No   OB History    Gravida Para Term Preterm AB TAB SAB Ectopic Multiple Living   2    2  1 1   0     Review of Systems  Constitutional: Negative for fever.  Gastrointestinal: Positive for nausea and abdominal pain. Negative for vomiting and diarrhea.  Genitourinary: Positive for vaginal bleeding and vaginal discharge.  All other systems reviewed and are negative.     Allergies  Lexapro and Latex  Home Medications   Prior to Admission medications   Medication Sig Start Date End Date Taking? Authorizing Provider   amitriptyline (ELAVIL) 25 MG tablet Take 25 mg by mouth at bedtime as needed. for sleep 04/21/15  Yes Historical Provider, MD  Dimethyl Fumarate 240 MG CPDR Take 1 capsule (240 mg total) by mouth 2 (two) times daily. 05/07/15  Yes Carmen Dohmeier, MD  ferrous sulfate 325 (65 FE) MG tablet Take 325 mg by mouth every morning.   Yes Historical Provider, MD  gabapentin (NEURONTIN) 300 MG capsule Take one i after lunch, and one at bed time. po Patient taking differently: Take 300 mg by mouth 2 (two) times daily. Take one after lunch, and one at bedtime 04/01/15  Yes Melvyn Novas, MD  Naltrexone-Bupropion HCl ER (CONTRAVE) 8-90 MG TB12 Take 1-2 tablets by mouth 2 (two) times daily. 2 in the morning and 1 at bedtime   Yes Historical Provider, MD  cetirizine (ZYRTEC) 10 MG tablet Take 10 mg by mouth daily as needed for allergies.    Historical Provider, MD  diclofenac (VOLTAREN) 75 MG EC tablet Take 75 mg by mouth 2 (two) times daily as needed. pain 09/14/13   Historical Provider, MD  fluticasone (FLONASE) 50 MCG/ACT nasal spray Place 2 sprays into the nose daily.    Historical Provider, MD   BP 99/55 mmHg  Pulse 101  Temp(Src) 98.7 F (37.1 C) (Oral)  Resp 20  Ht 5\' 4"  (1.626 m)  Wt 147.419 kg  BMI 55.76 kg/m2  SpO2 100%  LMP 04/16/2015 Physical Exam  Constitutional: She appears distressed.  Obese, tearful  HENT:  Head: Normocephalic and atraumatic.  Right Ear: External ear normal.  Left Ear: External ear normal.  Eyes: Conjunctivae are normal. Right eye exhibits no discharge. Left eye exhibits no discharge. No scleral icterus.  Neck: Neck supple. No tracheal deviation present.  Cardiovascular: Normal rate, regular rhythm and intact distal pulses.   Pulmonary/Chest: Effort normal and breath sounds normal. No stridor. No respiratory distress. She has no wheezes. She has no rales.  Abdominal: Soft. Bowel sounds are normal. She exhibits no distension. There is tenderness in the right lower  quadrant. There is guarding. There is no rigidity and no rebound. No hernia.  Musculoskeletal: She exhibits no edema or tenderness.  Neurological: She is alert. She has normal strength. No cranial nerve deficit (no facial droop, extraocular movements intact, no slurred speech) or sensory deficit. She exhibits normal muscle tone. She displays no seizure activity. Coordination normal.  Skin: Skin is warm and dry. No rash noted. She is not diaphoretic.  Psychiatric: She has a normal mood and affect.  Nursing note and vitals reviewed.   ED Course  Procedures (including critical care time) Labs Review Labs Reviewed  URINALYSIS, ROUTINE W REFLEX MICROSCOPIC (NOT AT [facility]) - Abnormal; Notable for the following:    Hgb urine dipstick MODERATE (*)    Ketones, ur 40 (*)    Leukocytes, UA SMALL (*)    All other components within normal limits  COMPREHENSIVE METABOLIC PANEL - Abnormal; Notable for the following:    Glucose, Bld 123 (*)    All other components within normal limits  URINE MICROSCOPIC-ADD ON - Abnormal; Notable for the following:    Squamous Epithelial / LPF 0-5 (*)    Bacteria, UA FEW (*)    All other components within normal limits  PREGNANCY, URINE  CBC WITH DIFFERENTIAL/PLATELET  LIPASE, BLOOD  RPR  HIV ANTIBODY (ROUTINE TESTING)  GC/CHLAMYDIA PROBE AMP ([place]) NOT AT [facility]    Imaging Review Ct Abdomen Pelvis W Contrast  05/12/2015  CLINICAL DATA:  Right lower quadrant pain starting last night. Nausea for 3 days. EXAM: CT ABDOMEN AND PELVIS WITH CONTRAST TECHNIQUE: Multidetector CT imaging of the abdomen and pelvis was performed using the standard protocol following bolus administration of intravenous contrast. CONTRAST:  OMNIPAQUE IOHEXOL 300 MG/ML  SOLN COMPARISON:  09/27/2013 FINDINGS: Focal ground-glass changes in the lung base on the right, similar to previous study and probably inflammatory. Surgical absence of the gallbladder. No bile duct dilatation. The  liver, spleen, pancreas, adrenal glands, kidneys, abdominal aorta, inferior vena cava, and retroperitoneal lymph nodes are unremarkable. Stomach, small bowel, and colon are not abnormally distended. No free air or free fluid in the abdomen. Pelvis: The appendix is mildly distended and fluid filled. Appendiceal diameter measures about 10 mm. There is infiltration in the periappendiceal fat. Increased density at the base of the appendix suggests an appendicolith. No abscess. Uterus and ovaries are not enlarged. Bladder wall is not thickened. No pelvic mass or lymphadenopathy. No free or loculated pelvic fluid collections. No destructive bone lesions. IMPRESSION: Appendiceal distention and periappendiceal infiltration consistent with acute appendicitis. Probable appendicolith. No abscess. Electronically Signed   By: Burman Nieves M.D.   On: 05/12/2015 19:51   I have personally reviewed and evaluated these images and lab results as part of my  medical decision-making.  Medications  sodium chloride 0.9 % bolus 1,000 mL (1,000 mLs Intravenous New Bag/Given 05/12/15 1901)    And  0.9 %  sodium chloride infusion (not administered)  HYDROmorphone (DILAUDID) injection 0.5 mg (0.5 mg Intravenous Given 05/12/15 2007)  ondansetron (ZOFRAN) injection 4 mg (4 mg Intravenous Given 05/12/15 1901)  iohexol (OMNIPAQUE) 300 MG/ML solution 100 mL (100 mLs Intravenous Contrast Given 05/12/15 1934)     MDM   Final diagnoses:  Acute appendicitis, unspecified acute appendicitis type    Patient's CT scan confirms her suspicions of acute appendicitis. This correlates with her physical exam findings of tenderness palpation in the right lower quadrant with some local guarding.  I'll consult with general surgery regarding admission and further treatment.    Linwood Dibbles, MD 05/12/15 2008

## 2015-05-12 NOTE — Anesthesia Procedure Notes (Signed)
Procedure Name: Intubation Date/Time: 05/12/2015 9:50 PM Performed by: Franco Nones Pre-anesthesia Checklist: Patient identified, Patient being monitored, Timeout performed, Emergency Drugs available and Suction available Patient Re-evaluated:Patient Re-evaluated prior to inductionOxygen Delivery Method: Circle System Utilized Preoxygenation: Pre-oxygenation with 100% oxygen Intubation Type: IV induction, Rapid sequence and Cricoid Pressure applied Laryngoscope Size: Miller and 2 Grade View: Grade I Tube type: Oral Tube size: 7.0 mm Number of attempts: 1 Airway Equipment and Method: Stylet and Oral airway Placement Confirmation: ETT inserted through vocal cords under direct vision,  positive ETCO2 and breath sounds checked- equal and bilateral Secured at: 21 cm Tube secured with: Tape Dental Injury: Teeth and Oropharynx as per pre-operative assessment

## 2015-05-12 NOTE — ED Notes (Signed)
C/o of right lower abdominal pain since last night, rates pain 10/10.

## 2015-05-12 NOTE — Anesthesia Preprocedure Evaluation (Signed)
Anesthesia Evaluation  Patient identified by MRN, date of birth, ID band Patient awake    Reviewed: Allergy & Precautions, H&P , NPO status , Patient's Chart, lab work & pertinent test results  Airway Mallampati: I  TM Distance: >3 FB     Dental  (+) Teeth Intact   Pulmonary former smoker,    breath sounds clear to auscultation       Cardiovascular negative cardio ROS   Rhythm:Regular     Neuro/Psych PSYCHIATRIC DISORDERS Anxiety Chronic LBP     GI/Hepatic neg GERD  ,(+)     substance abuse  marijuana use, Biliary colic    Endo/Other  Morbid obesity  Renal/GU      Musculoskeletal MS diagnosis 2016 according to patient. States she is stable. Slight speech slurring, balance/vision problems.   Abdominal   Peds  Hematology   Anesthesia Other Findings   Reproductive/Obstetrics                             Anesthesia Physical Anesthesia Plan  ASA: II and emergent  Anesthesia Plan: General   Post-op Pain Management:    Induction: Intravenous, Rapid sequence and Cricoid pressure planned  Airway Management Planned: Oral ETT  Additional Equipment:   Intra-op Plan:   Post-operative Plan: Extubation in OR  Informed Consent:   Dental advisory given  Plan Discussed with: Anesthesiologist and Surgeon  Anesthesia Plan Comments:         Anesthesia Quick Evaluation

## 2015-05-12 NOTE — ED Notes (Signed)
Oral contrast completed.

## 2015-05-12 NOTE — H&P (Signed)
Brooke [name] is an 30 y.o. female.   Chief Complaint: Right lower quadrant abdominal pain HPI: Patient is a 30 year old morbidly obese black female who presented emergency room with a less than 24-hour history of worsening right lower quadrant abdominal pain. CT scan the abdomen revealed acute appendicitis.  Past Medical History  Diagnosis Date  . Anxiety   . Edema   . Chronic back pain   . MS (multiple sclerosis) ([place]) 09/29/2014    Past Surgical History  Procedure Laterality Date  . Wisdom tooth extraction    . Cholecystectomy N/A 10/07/2013    Procedure: LAPAROSCOPIC CHOLECYSTECTOMY;  Surgeon: Jamesetta So, MD;  Location: [facility];  Service: General;  Laterality: N/A;    Family History  Problem Relation Age of Onset  . Diabetes Mother   . Colon polyps Mother     hx of cancer  . Other Mother     DDD Lumber, cervical  . Sleep apnea Father    Social History:  reports that she quit smoking about 7 years ago. She has never used smokeless tobacco. She reports that she does not drink alcohol or use illicit drugs.  Allergies:  Allergies  Allergen Reactions  . Lexapro [Escitalopram Oxalate] Other (See Comments)    Suicidal thoughts, lowered libido  . Latex Hives     (Not in a hospital admission)  Results for orders placed or performed during the hospital encounter of 05/12/15 (from the past 48 hour(s))  Pregnancy, urine     Status: None   Collection Time: 05/12/15  3:40 PM  Result Value Ref Range   Preg Test, Ur NEGATIVE NEGATIVE    Comment:        THE SENSITIVITY OF THIS METHODOLOGY IS >20 mIU/mL.   Urinalysis, Routine w reflex microscopic (not at [facility])     Status: Abnormal   Collection Time: 05/12/15  3:40 PM  Result Value Ref Range   Color, Urine YELLOW YELLOW   APPearance CLEAR CLEAR   Specific Gravity, Urine 1.020 1.005 - 1.030   pH 6.0 5.0 - 8.0   Glucose, UA NEGATIVE NEGATIVE mg/dL   Hgb urine dipstick MODERATE (A) NEGATIVE   Bilirubin Urine NEGATIVE  NEGATIVE   Ketones, ur 40 (A) NEGATIVE mg/dL   Protein, ur NEGATIVE NEGATIVE mg/dL   Nitrite NEGATIVE NEGATIVE   Leukocytes, UA SMALL (A) NEGATIVE  Urine microscopic-add on     Status: Abnormal   Collection Time: 05/12/15  3:40 PM  Result Value Ref Range   Squamous Epithelial / LPF 0-5 (A) NONE SEEN   WBC, UA 0-5 0 - 5 WBC/hpf   RBC / HPF 6-30 0 - 5 RBC/hpf   Bacteria, UA FEW (A) NONE SEEN  CBC with Differential     Status: None   Collection Time: 05/12/15  4:51 PM  Result Value Ref Range   WBC 9.2 4.0 - 10.5 K/uL   RBC 4.43 3.87 - 5.11 MIL/uL   Hemoglobin 13.0 12.0 - 15.0 g/dL   HCT 37.7 36.0 - 46.0 %   MCV 85.1 78.0 - 100.0 fL   MCH 29.3 26.0 - 34.0 pg   MCHC 34.5 30.0 - 36.0 g/dL   RDW 13.6 11.5 - 15.5 %   Platelets 265 150 - 400 K/uL   Neutrophils Relative % 82 %   Neutro Abs 7.5 1.7 - 7.7 K/uL   Lymphocytes Relative 14 %   Lymphs Abs 1.3 0.7 - 4.0 K/uL   Monocytes Relative 4 %   Monocytes  Absolute 0.4 0.1 - 1.0 K/uL   Eosinophils Relative 0 %   Eosinophils Absolute 0.0 0.0 - 0.7 K/uL   Basophils Relative 0 %   Basophils Absolute 0.0 0.0 - 0.1 K/uL  Comprehensive metabolic panel     Status: Abnormal   Collection Time: 05/12/15  4:51 PM  Result Value Ref Range   Sodium 137 135 - 145 mmol/L   Potassium 4.0 3.5 - 5.1 mmol/L   Chloride 103 101 - 111 mmol/L   CO2 28 22 - 32 mmol/L   Glucose, Bld 123 (H) 65 - 99 mg/dL   BUN 9 6 - 20 mg/dL   Creatinine, Ser 0.76 0.44 - 1.00 mg/dL   Calcium 9.1 8.9 - 10.3 mg/dL   Total Protein 8.1 6.5 - 8.1 g/dL   Albumin 3.7 3.5 - 5.0 g/dL   AST 20 15 - 41 U/L   ALT 26 14 - 54 U/L   Alkaline Phosphatase 67 38 - 126 U/L   Total Bilirubin 0.5 0.3 - 1.2 mg/dL   GFR calc non Af Amer >60 >60 mL/min   GFR calc Af Amer >60 >60 mL/min    Comment: (NOTE) The eGFR has been calculated using the CKD EPI equation. This calculation has not been validated in all clinical situations. eGFR's persistently <60 mL/min signify possible Chronic  Kidney Disease.    Anion gap 6 5 - 15  Lipase, blood     Status: None   Collection Time: 05/12/15  4:51 PM  Result Value Ref Range   Lipase 35 11 - 51 U/L   Ct Abdomen Pelvis W Contrast  05/12/2015  CLINICAL DATA:  Right lower quadrant pain starting last night. Nausea for 3 days. EXAM: CT ABDOMEN AND PELVIS WITH CONTRAST TECHNIQUE: Multidetector CT imaging of the abdomen and pelvis was performed using the standard protocol following bolus administration of intravenous contrast. CONTRAST:  135m OMNIPAQUE IOHEXOL 300 MG/ML  SOLN COMPARISON:  09/27/2013 FINDINGS: Focal ground-glass changes in the lung base on the right, similar to previous study and probably inflammatory. Surgical absence of the gallbladder. No bile duct dilatation. The liver, spleen, pancreas, adrenal glands, kidneys, abdominal aorta, inferior vena cava, and retroperitoneal lymph nodes are unremarkable. Stomach, small bowel, and colon are not abnormally distended. No free air or free fluid in the abdomen. Pelvis: The appendix is mildly distended and fluid filled. Appendiceal diameter measures about 10 mm. There is infiltration in the periappendiceal fat. Increased density at the base of the appendix suggests an appendicolith. No abscess. Uterus and ovaries are not enlarged. Bladder wall is not thickened. No pelvic mass or lymphadenopathy. No free or loculated pelvic fluid collections. No destructive bone lesions. IMPRESSION: Appendiceal distention and periappendiceal infiltration consistent with acute appendicitis. Probable appendicolith. No abscess. Electronically Signed   By: WLucienne CapersM.D.   On: 05/12/2015 19:51    Review of Systems  Constitutional: Positive for malaise/fatigue.  HENT: Negative.   Eyes: Negative.   Respiratory: Negative.   Cardiovascular: Negative.   Gastrointestinal: Positive for nausea and abdominal pain.  Genitourinary: Negative.   Musculoskeletal: Negative.   Skin: Negative.     Blood pressure  99/55, pulse 101, temperature 98.7 F (37.1 C), temperature source Oral, resp. rate 20, height 5' 4"  (1.626 m), weight 147.419 kg (325 lb), last menstrual period 04/16/2015, SpO2 100 %. Physical Exam  Vitals reviewed. Constitutional: She is oriented to person, place, and time. She appears well-developed and well-nourished.  Morbidly obese  HENT:  Head: Normocephalic and atraumatic.  Neck: Normal range of motion. Neck supple.  Cardiovascular: Normal rate, regular rhythm and normal heart sounds.   Respiratory: Effort normal and breath sounds normal.  GI: Soft. She exhibits no distension. There is tenderness. There is no rebound.  Tender in the right lower quadrant to palpation. No rigidity noted.  Neurological: She is alert and oriented to person, place, and time.  Skin: Skin is warm and dry.     Assessment/Plan Impression: Acute appendicitis Plan: Patient be taken to the operating room for laparoscopic appendectomy. The risks and benefits of the procedure including bleeding, infection, and the possibility of an open procedure were fully explained to the patient, who gave informed consent.  Jamesetta So, MD 05/12/2015, 9:13 PM

## 2015-05-13 ENCOUNTER — Encounter (HOSPITAL_COMMUNITY): Payer: Self-pay | Admitting: *Deleted

## 2015-05-13 LAB — CBC
HEMATOCRIT: 32.6 % — AB (ref 36.0–46.0)
HEMOGLOBIN: 11.3 g/dL — AB (ref 12.0–15.0)
MCH: 29.4 pg (ref 26.0–34.0)
MCHC: 34.7 g/dL (ref 30.0–36.0)
MCV: 84.9 fL (ref 78.0–100.0)
Platelets: 227 10*3/uL (ref 150–400)
RBC: 3.84 MIL/uL — ABNORMAL LOW (ref 3.87–5.11)
RDW: 13.6 % (ref 11.5–15.5)
WBC: 11 10*3/uL — ABNORMAL HIGH (ref 4.0–10.5)

## 2015-05-13 LAB — RPR: RPR Ser Ql: NONREACTIVE

## 2015-05-13 LAB — HIV ANTIBODY (ROUTINE TESTING W REFLEX): HIV Screen 4th Generation wRfx: NONREACTIVE

## 2015-05-13 MED ORDER — OXYCODONE-ACETAMINOPHEN 5-325 MG PO TABS
1.0000 | ORAL_TABLET | ORAL | Status: DC | PRN
  Administered 2015-05-13: 1 via ORAL
  Filled 2015-05-13: qty 1

## 2015-05-13 MED ORDER — HYDROMORPHONE HCL 1 MG/ML IJ SOLN: 1.0000 mg | INTRAMUSCULAR | Status: DC | PRN

## 2015-05-13 MED ORDER — OXYCODONE-ACETAMINOPHEN 7.5-325 MG PO TABS: 1.0000 | ORAL_TABLET | ORAL | Status: DC | PRN

## 2015-05-13 MED ORDER — DIMETHYL FUMARATE 240 MG PO CPDR: 240.0000 mg | DELAYED_RELEASE_CAPSULE | Freq: Two times a day (BID) | ORAL | Status: DC

## 2015-05-13 MED ORDER — ACETAMINOPHEN 325 MG PO TABS: 650.0000 mg | ORAL_TABLET | Freq: Four times a day (QID) | ORAL | Status: DC | PRN

## 2015-05-13 MED ORDER — LORAZEPAM 2 MG/ML IJ SOLN: 1.0000 mg | INTRAMUSCULAR | Status: DC | PRN

## 2015-05-13 MED ORDER — ACETAMINOPHEN 650 MG RE SUPP: 650.0000 mg | Freq: Four times a day (QID) | RECTAL | Status: DC | PRN

## 2015-05-13 MED ORDER — LACTATED RINGERS IV SOLN
INTRAVENOUS | Status: DC
Start: 2015-05-13 — End: 2015-05-13
  Administered 2015-05-13: 04:00:00 via INTRAVENOUS

## 2015-05-13 MED ORDER — GABAPENTIN 300 MG PO CAPS
300.0000 mg | ORAL_CAPSULE | Freq: Two times a day (BID) | ORAL | Status: DC
  Administered 2015-05-13: 300 mg via ORAL
  Filled 2015-05-13: qty 3

## 2015-05-13 MED ORDER — ENOXAPARIN SODIUM 40 MG/0.4ML ~~LOC~~ SOLN
40.0000 mg | SUBCUTANEOUS | Status: DC
  Administered 2015-05-13: 40 mg via SUBCUTANEOUS
  Filled 2015-05-13: qty 0.4

## 2015-05-13 MED ORDER — PROMETHAZINE HCL 25 MG/ML IJ SOLN: 25.0000 mg | Freq: Four times a day (QID) | INTRAMUSCULAR | Status: DC | PRN

## 2015-05-13 MED ORDER — SIMETHICONE 80 MG PO CHEW: 40.0000 mg | CHEWABLE_TABLET | Freq: Four times a day (QID) | ORAL | Status: DC | PRN

## 2015-05-13 NOTE — Discharge Instructions (Signed)
Laparoscopic Appendectomy, Adult, Care After °Refer to this sheet in the next few weeks. These instructions provide you with information on caring for yourself after your procedure. Your caregiver may also give you more specific instructions. Your treatment has been planned according to current medical practices, but problems sometimes occur. Call your caregiver if you have any problems or questions after your procedure. °HOME CARE INSTRUCTIONS °· Do not drive while taking narcotic pain medicines. °· Use stool softener if you become constipated from your pain medicines. °· Change your bandages (dressings) as directed. °· Keep your wounds clean and dry. You may wash the wounds gently with soap and water. Gently pat the wounds dry with a clean towel. °· Do not take baths, swim, or use hot tubs for 10 days, or as instructed by your caregiver. °· Only take over-the-counter or prescription medicines for pain, discomfort, or fever as directed by your caregiver. °· You may continue your normal diet as directed. °· Do not lift more than 10 pounds (4.5 kg) or play contact sports for 3 weeks, or as directed. °· Slowly increase your activity after surgery. °· Take deep breaths to avoid getting a lung infection (pneumonia). °SEEK MEDICAL CARE IF: °· You have redness, swelling, or increasing pain in your wounds. °· You have pus coming from your wounds. °· You have drainage from a wound that lasts longer than 1 day. °· You notice a bad smell coming from the wounds or dressing. °· Your wound edges break open after stitches (sutures) have been removed. °· You notice increasing pain in the shoulders (shoulder strap areas) or near your shoulder blades. °· You develop dizzy episodes or fainting while standing. °· You develop shortness of breath. °· You develop persistent nausea or vomiting. °· You cannot control your bowel functions or lose your appetite. °· You develop diarrhea. °SEEK IMMEDIATE MEDICAL CARE IF:  °· You have a  fever. °· You develop a rash. °· You have difficulty breathing or sharp pains in your chest. °· You develop any reaction or side effects to medicines given. °MAKE SURE YOU: °· Understand these instructions. °· Will watch your condition. °· Will get help right away if you are not doing well or get worse. °  °This information is not intended to replace advice given to you by your health care provider. Make sure you discuss any questions you have with your health care provider. °  °Document Released: 02/07/2005 Document Revised: 06/24/2014 Document Reviewed: 07/28/2014 °Elsevier Interactive Patient Education ©2016 [employer] ° °

## 2015-05-13 NOTE — Progress Notes (Signed)
Discharged PT per MD order and protocol. Discharge handouts reviewed/explained. Education completed. Prescriptions given.  Pt verbalized understanding and left with all belongings. VSS. IV catheter D/C.  Patient wheeled down by staff member. Lesly Dukes, RN

## 2015-05-13 NOTE — Discharge Summary (Signed)
Physician Discharge Summary  Patient ID: Brooke [name] MRN: [id number] DOB/AGE: 19-Dec-1985 30 y.o.  Admit date: 05/12/2015 Discharge date: 05/13/2015  Admission Diagnoses: Acute appendicitis  Discharge Diagnoses: Same Active Problems:   Acute appendicitis  morbid obesity  Discharged Condition: good  Hospital Course: Patient is a 30 year old black female who presented to the emergency room on 05/12/2015 with right lower quadrant abdominal pain. She was found on CAT scan of the abdomen to have acute appendicitis. She was taken to the operating room on 05/12/2015 and underwent laparoscopic appendectomy. She tolerated the procedure well. Her postoperative course has been unremarkable. Her diet was advanced without difficulty. Patient is being discharged home on 05/13/2015 in good and improving condition.  Treatments: surgery: Laparoscopic appendectomy on 05/12/2015   Discharge Exam: Blood pressure 121/50, pulse 82, temperature 98.5 F (36.9 C), temperature source Oral, resp. rate 20, height  (1.626 m), weight 148.1 kg (326 lb 8 oz), last menstrual period 04/16/2015, SpO2 98 %. General appearance: alert, cooperative and no distress Resp: clear to auscultation bilaterally Cardio: regular rate and rhythm, S1, S2 normal, no murmur, click, rub or gallop GI: Soft, dressings dry and intact.  Disposition: 01-Home or Self Care     Medication List    TAKE these medications        amitriptyline 25 MG tablet  Commonly known as:  ELAVIL  Take 25 mg by mouth at bedtime as needed. for sleep     CONTRAVE 8-90 MG Tb12  Generic drug:  Naltrexone-Bupropion HCl ER  Take 1-2 tablets by mouth 2 (two) times daily. 2 in the morning and 1 at bedtime     Dimethyl Fumarate 240 MG Cpdr  Take 1 capsule (240 mg total) by mouth 2 (two) times daily.     ferrous sulfate 325 (65 FE) MG tablet  Take 325 mg by mouth every morning.     gabapentin 300 MG capsule  Commonly known as:  NEURONTIN  Take  one i after lunch, and one at bed time. po     oxyCODONE-acetaminophen 7.5-325 MG tablet  Commonly known as:  PERCOCET  Take 1-2 tablets by mouth every 4 (four) hours as needed.           Follow-up Information    Follow up with Dalia Heading, MD. Schedule an appointment as soon as possible for a visit on 05/21/2015.   Specialty:  General Surgery   Contact information:   [address] [place] [address] [phone]       Signed: Franky Macho A 05/13/2015, 8:46 AM

## 2015-05-19 ENCOUNTER — Telehealth: Payer: Self-pay | Admitting: Neurology

## 2015-05-19 NOTE — Telephone Encounter (Signed)
Patient called back, "left message earlier, states she hasn't heard back from anyone and fast starts tomorrow, she needs to know something".

## 2015-05-19 NOTE — Telephone Encounter (Signed)
Spoke to pt and advised her that Dr. Vickey Huger thought the fasting sounds healthy, but encouraged pt to keep hydrated. Pt verbalized understanding.

## 2015-05-19 NOTE — Telephone Encounter (Signed)
Fine, sounds healthy- keep hydrated.

## 2015-05-19 NOTE — Telephone Encounter (Signed)
Patient is calling and states that she would like permission to go on a fast for 7 days with her church:  No meats, sweets or fatty foods.  Please call.

## 2015-05-28 ENCOUNTER — Other Ambulatory Visit: Payer: Self-pay

## 2015-05-28 ENCOUNTER — Telehealth: Payer: Self-pay | Admitting: Neurology

## 2015-05-28 MED ORDER — DIMETHYL FUMARATE 240 MG PO CPDR: 240.0000 mg | DELAYED_RELEASE_CAPSULE | Freq: Two times a day (BID) | ORAL | Status: DC

## 2015-05-28 NOTE — Telephone Encounter (Signed)
See phone note

## 2015-05-28 NOTE — Telephone Encounter (Signed)
I spoke to pt. She says that [facility] called her today and informed her that she has been enrolled in a patient assistance program and the tecfidera prescription must be sent to [facility] because they handle all of the patient assistance medications.  I called [facility], they confirmed this information, and have the pt in their system, and informed me that to electronically send the RX, it must go to [facility] [place]. Refill send to [facility]. Receipt of confirmation received.  I spoke to pt and advised her that the RX for tecfidera was sent to [facility]. Pt verbalized understanding.

## 2015-05-28 NOTE — Telephone Encounter (Signed)
Pt is requesting refill for tecfidera to be sent to [facility] (f) [phone]. Pt will be out of medication in 6 days.

## 2015-06-29 ENCOUNTER — Telehealth: Payer: Self-pay | Admitting: *Deleted

## 2015-06-29 NOTE — Telephone Encounter (Signed)
Patient reports new of pt shaking hand shaking so bad  Back pain .that started about  two months ago.  An appointment was made for 07/07/15 10:00. Gave the patient the option to go to the Emergency Room. Patient declined.  Advised that the nurse would call if there was any other questions.

## 2015-06-29 NOTE — Telephone Encounter (Signed)
Noted, thank you

## 2015-07-07 ENCOUNTER — Ambulatory Visit: Payer: Self-pay | Admitting: Neurology

## 2015-07-07 ENCOUNTER — Telehealth: Payer: Self-pay

## 2015-07-07 NOTE — Telephone Encounter (Signed)
Pt did not show for their appt with Dr. Dohmeier today.  

## 2015-07-08 ENCOUNTER — Encounter: Payer: Self-pay | Admitting: Neurology

## 2015-07-10 ENCOUNTER — Emergency Department (HOSPITAL_COMMUNITY)
Admission: EM | Admit: 2015-07-10 | Discharge: 2015-07-10 | Disposition: A | Discharge status: Home / Self Care | Payer: Medicaid Other | Attending: Emergency Medicine | Admitting: Emergency Medicine

## 2015-07-10 ENCOUNTER — Encounter (HOSPITAL_COMMUNITY): Payer: Self-pay | Admitting: *Deleted

## 2015-07-10 DIAGNOSIS — Z9049 Acquired absence of other specified parts of digestive tract: Secondary | ICD-10-CM | POA: Insufficient documentation

## 2015-07-10 DIAGNOSIS — F17200 Nicotine dependence, unspecified, uncomplicated: Secondary | ICD-10-CM | POA: Diagnosis not present

## 2015-07-10 DIAGNOSIS — Z79899 Other long term (current) drug therapy: Secondary | ICD-10-CM | POA: Diagnosis not present

## 2015-07-10 DIAGNOSIS — J019 Acute sinusitis, unspecified: Secondary | ICD-10-CM | POA: Insufficient documentation

## 2015-07-10 DIAGNOSIS — M7551 Bursitis of right shoulder: Secondary | ICD-10-CM

## 2015-07-10 DIAGNOSIS — Z91040 Latex allergy status: Secondary | ICD-10-CM | POA: Diagnosis not present

## 2015-07-10 DIAGNOSIS — R0981 Nasal congestion: Secondary | ICD-10-CM | POA: Diagnosis present

## 2015-07-10 MED ORDER — PSEUDOEPHEDRINE HCL ER 120 MG PO TB12: 120.0000 mg | ORAL_TABLET | Freq: Two times a day (BID) | ORAL | Status: DC

## 2015-07-10 MED ORDER — NAPROXEN 500 MG PO TABS: 500.0000 mg | ORAL_TABLET | Freq: Two times a day (BID) | ORAL | Status: DC

## 2015-07-10 NOTE — ED Notes (Signed)
Patient with no complaints at this time. Respirations even and unlabored. Skin warm/dry. Discharge instructions reviewed with patient at this time. Patient given opportunity to voice concerns/ask questions. Patient discharged at this time and left Emergency Department with steady gait.   

## 2015-07-10 NOTE — ED Provider Notes (Signed)
CSN: [id number]     Arrival date & time 07/10/15  4098 History   First MD Initiated Contact with Patient 07/10/15 [phone]     Chief Complaint  Patient presents with  . Nasal Congestion     (Consider location/radiation/quality/duration/timing/severity/associated sxs/prior Treatment) HPI   Brooke [name] is a 30 y.o. female who presents to the Emergency Department complaining of nasal congestion, cough and sore throat for 2 days.  She states that her nose feels "stopped up" and reports facial pressure and drainage that she feels is making her throat sore.  She has been using Flonase with minimal relief.  Cough has been non productive.  She denies chest pain, shortness of breath, N/V, and fever.  She also complains of bilateral shoulder pain for 2 weeks, right greater than left.  She states pain has been intermittent and associated with movement.  Pain improves at rest.  She states that she sleeps with her arms over her head and pain to her shoulders is worse upon waking and improves somewhat during the day.  She has not taken any medication for symptom relief.     Past Medical History  Diagnosis Date  . Anxiety   . Edema   . Chronic back pain   . MS (multiple sclerosis) (HCC) 09/29/2014   Past Surgical History  Procedure Laterality Date  . Wisdom tooth extraction    . Cholecystectomy N/A 10/07/2013    Procedure: LAPAROSCOPIC CHOLECYSTECTOMY;  Surgeon: Dalia Heading, MD;  Location: [facility];  Service: General;  Laterality: N/A;  . Laparoscopic appendectomy N/A 05/12/2015    Procedure: APPENDECTOMY LAPAROSCOPIC;  Surgeon: Franky Macho, MD;  Location: [facility];  Service: General;  Laterality: N/A;  . Appendectomy     Family History  Problem Relation Age of Onset  . Diabetes Mother   . Colon polyps Mother     hx of cancer  . Other Mother     DDD Lumber, cervical  . Sleep apnea Father    Social History  Substance Use Topics  . Smoking status: Current Every Day Smoker    Last Attempt to  Quit: 09/16/2007  . Smokeless tobacco: Never Used  . Alcohol Use: No   OB History    Gravida Para Term Preterm AB TAB SAB Ectopic Multiple Living   0     Review of Systems  Constitutional: Negative for fever and chills.  HENT: Positive for congestion, ear pain and sore throat. Negative for rhinorrhea and trouble swallowing.   Respiratory: Positive for cough. Negative for chest tightness and shortness of breath.   Cardiovascular: Negative for chest pain.  Gastrointestinal: Negative for nausea, vomiting and abdominal pain.  Musculoskeletal: Positive for arthralgias (right shoulder pain). Negative for joint swelling.  Skin: Negative for color change and wound.  Neurological: Negative for dizziness, syncope, speech difficulty, weakness, light-headedness, numbness and headaches.  All other systems reviewed and are negative.     Allergies  Lexapro and Latex  Home Medications   Prior to Admission medications   Medication Sig Start Date End Date Taking? Authorizing Provider  amitriptyline (ELAVIL) 25 MG tablet Take 25 mg by mouth at bedtime as needed. for sleep 04/21/15   Historical Provider, MD  Dimethyl Fumarate 240 MG CPDR Take 1 capsule (240 mg total) by mouth 2 (two) times daily. 05/28/15   Porfirio Mylar Dohmeier, MD  ferrous sulfate 325 (65 FE) MG tablet Take 325 mg by mouth every morning.  Historical Provider, MD  gabapentin (NEURONTIN) 300 MG capsule Take one i after lunch, and one at bed time. po Patient taking differently: Take 300 mg by mouth 2 (two) times daily. Take one after lunch, and one at bedtime 04/01/15   Melvyn Novas, MD  Naltrexone-Bupropion HCl ER (CONTRAVE) 8-90 MG TB12 Take 1-2 tablets by mouth 2 (two) times daily. 2 in the morning and 1 at bedtime    Historical Provider, MD  oxyCODONE-acetaminophen (PERCOCET) 7.5-325 MG tablet Take 1-2 tablets by mouth every 4 (four) hours as needed. 05/13/15   Franky Macho, MD   BP 129/62 mmHg  Pulse 92  Temp(Src) 98.1  F (36.7 C) (Oral)  Resp 16  Ht 5\' 4"  (1.626 m)  Wt 136.079 kg  BMI 51.47 kg/m2  SpO2 97%  LMP 06/27/2015 Physical Exam  Constitutional: She is oriented to person, place, and time. She appears well-developed and well-nourished. No distress.  HENT:  Head: Normocephalic and atraumatic.  Right Ear: Tympanic membrane and ear canal normal.  Left Ear: Ear canal normal. A middle ear effusion is present.  Nose: Mucosal edema present. No rhinorrhea. Right sinus exhibits frontal sinus tenderness. Left sinus exhibits frontal sinus tenderness.  Mouth/Throat: Uvula is midline and mucous membranes are normal. No trismus in the jaw. No uvula swelling. Posterior oropharyngeal erythema present. No oropharyngeal exudate, posterior oropharyngeal edema or tonsillar abscesses.  Mild left middle ear effusion, landmarks are not well visualized  Eyes: Conjunctivae are normal.  Neck: Normal range of motion and phonation normal. Neck supple. No Brudzinski's sign and no Kernig's sign noted.  Cardiovascular: Normal rate, regular rhythm and intact distal pulses.   No murmur heard. Pulmonary/Chest: Effort normal and breath sounds normal. No respiratory distress. She has no wheezes. She has no rales.  Musculoskeletal: Normal range of motion. She exhibits tenderness. She exhibits no edema.  Tenderness through ROM of the right shoulder and deltoid muscle.  No erythema, edema.  No motor or sensory deficit. nml ROM of the left shoulder, NT  Lymphadenopathy:    She has no cervical adenopathy.  Neurological: She is alert and oriented to person, place, and time. She exhibits normal muscle tone. Coordination normal.  Skin: Skin is warm and dry.  Nursing note and vitals reviewed.   ED Course  Procedures (including critical care time) Labs Review Labs Reviewed - No data to display  Imaging Review No results found. I have personally reviewed and evaluated these images and lab results as part of my medical  decision-making.   EKG Interpretation None      MDM   Final diagnoses:  Acute sinusitis, recurrence not specified, unspecified location  Bursitis, shoulder, right   Pt well appearing.  Vitals stable.  Likely sinusitis and shoulder pain likely inflammatory process due to repetitive movement.  NV intact. No concerning sx's for septic joint.  She appears stable for d/c and agrees to symptomatic tx and PMD f/u if needed.        Pauline Aus, PA-C 07/10/15 1047  Lavera Guise, MD 07/10/15 [phone]

## 2015-07-10 NOTE — ED Notes (Signed)
Pt comes in with nasal congestion starting 2 days ago. Pt states she has had minimal coughing. In addition, pt complains of bilateral arm pain that started 2 weeks ago. NAD noted.

## 2015-07-10 NOTE — Discharge Instructions (Signed)
Sinusitis, Adult  Sinusitis is redness, soreness, and puffiness (inflammation) of the air pockets in the bones of your face (sinuses). The redness, soreness, and puffiness can cause air and mucus to get trapped in your sinuses. This can allow germs to grow and cause an infection.   HOME CARE    Drink enough fluids to keep your pee (urine) clear or pale yellow.   Use a humidifier in your home.   Run a hot shower to create steam in the bathroom. Sit in the bathroom with the door closed. Breathe in the steam 3-4 times a day.   Put a warm, moist washcloth on your face 3-4 times a day, or as told by your doctor.   Use salt water sprays (saline sprays) to wet the thick fluid in your nose. This can help the sinuses drain.   Only take medicine as told by your doctor.  GET HELP RIGHT AWAY IF:    Your pain gets worse.   You have very bad headaches.   You are sick to your stomach (nauseous).   You throw up (vomit).   You are very sleepy (drowsy) all the time.   Your face is puffy (swollen).   Your vision changes.   You have a stiff neck.   You have trouble breathing.  MAKE SURE YOU:    Understand these instructions.   Will watch your condition.   Will get help right away if you are not doing well or get worse.     This information is not intended to replace advice given to you by your health care provider. Make sure you discuss any questions you have with your health care provider.     Document Released: 07/27/2007 Document Revised: 02/28/2014 Document Reviewed: 09/13/2011  Elsevier Interactive Patient Education 2016 [employer]

## 2015-07-15 ENCOUNTER — Ambulatory Visit (INDEPENDENT_AMBULATORY_CARE_PROVIDER_SITE_OTHER): Payer: Medicaid Other | Admitting: Adult Health

## 2015-07-15 ENCOUNTER — Encounter: Payer: Self-pay | Admitting: Adult Health

## 2015-07-15 VITALS — BP 115/79 | HR 76 | Ht 64.0 in | Wt 329.0 lb

## 2015-07-15 DIAGNOSIS — Z5181 Encounter for therapeutic drug level monitoring: Secondary | ICD-10-CM | POA: Diagnosis not present

## 2015-07-15 DIAGNOSIS — F4323 Adjustment disorder with mixed anxiety and depressed mood: Secondary | ICD-10-CM

## 2015-07-15 DIAGNOSIS — G35 Multiple sclerosis: Secondary | ICD-10-CM

## 2015-07-15 NOTE — Progress Notes (Signed)
PATIENT: Brooke [name] DOB: Mar 27, 1985  REASON FOR VISIT: follow up- multiple sclerosis HISTORY FROM: patient  HISTORY OF PRESENT ILLNESS: Ms. Brooke [name] is a 30 year old female with a history of multiple sclerosis. She returns today for follow-up. She remains on Tecfidera and is tolerating it well. At her last visit with Dr. Vickey Huger they were considering  switching to Tysabri however the patient is blood work revealed a positive JCV so the patient was advised to remain on Tecfidera . The patient does not report any new symptoms however she does still complain of weakness in both lower extremities. She states that she will drag the left leg when ambulating. She reports that she has not had any additional falls but she does occasionally stumble. She does feel that her balance is off and her family member agrees with this. She also reports blurry vision. She denies any changes with the bowels or bladder although she does feel that she does have some urgency. The patient states that she has noticed a tremor primarily in the left arm. She states that she has noticed this when she is holding a cup. She also at times will feel shaky on the inside. She does report some anxiety and possible depression. She states that she also has chronic back pain she reports that her primary care would not start her on any pain medication. She is requesting to be sent to a pain specialist. She also reports that her primary care would not place her on any anxiety medication because he did not want her to become addicted to this medication. She returns today for an evaluation.  HISTORY (DOHMEIER): Miss Brooke [name] is a 30 year old female with a history of multiple sclerosis. She returns 04-01-15 for follow-up. She is currently on Tecfidera and tolerating it well although she states that she feels that it is "not working." She states that she continues to have the episodes that she had before starting tecfidera. These episodes  include slurred speech (at times), jitteriness and dragging the right leg. Some times she shakes in either leg. These episodes do not happen daily but rather intermittently. She confessed to missing oral doses. We discussed transfer to [facility]. Ms. [name]'s family also reports that she indeed has fallen several times in the last 6 months, luckily without any injuries. We are meeting today to discuss her recent lab results and also the MRI from 01/21/2015. This was compared to an MRI from January of the same year; there was a new focus seen in the left anterior internal capsule that was not present on the January films, but there has been a resolution of another focus.  The MRI is consistent with multiple sclerosis. Her labs were also reviewed and were entirely normal. Her CBC with differential was entirely normal limits dated and resulted on 12/31/2014.    Last visit with MM-She also reports that she now has some ringing in the left ear and tremors that affect the hand and leg that are intermittent. Patient denies any new numbness or weakness. She does feel that the weakness in her legs has gotten worse over time. The patient does have a history of bladder incontinence. She reports in the last 2 months she's had some bowel incontinence approximately 1-2 episodes each month. The patient does feel that her vision has changed she feels that the glasses that she has no longer corrects her vision. She did not follow up with neuro-ophthalmology due to insurance. Patient denies any changes with the gait or  balance. She does feel that her balance has gotten worse over time. The patient states that her last MRI was in March prior to her diagnosis of MS. She returns today for an evaluation. I have provided the patient with the phone number to the neuro-ophthalmologist to reschedule her appointment.   HISTORY (DOHMEIER): Brooke [name] is a 30 y.o. female seen here as a referral from Dr. Concepcion Elk for memory  difficulties, delayed recall , episodic.   Brooke [name] is seen here today for a chief complaint of memory difficulties - especially short-term memory. She reports that sometimes she has trouble deciphering information verbally or read by her but often she understands and she can just not retain the information given to her.  Throughout her life her learning style has been more hands on and she is good at things that were demonstrated to her and that she could copy. The see one, do One, teach one, Approach. She has spells however only for the last 1-2 month.  Her mother and her boyfriend have both noticed that she is sometimes staring off in the midst of conversation. The patient herself called these her "episodes". Her speech can become slurred for 10 seconds or shorter, her gaze is empty, the spells are accompanied by sensation of a wave coming over her, burning hot. She gets diaphoretic. She has been morbidly obese for many years, but she was able to lose 60 pounds. She is borderline diabetic, but did not associate the waves with glucose levels.  There is no family history of seizures, onepaternal half sister is autistic. Dr. Concepcion Elk send her with a recent MRI brain .  The patient had for the first time a influenza vaccine given to her at the job around September 2015. She believes that her symptoms started not long after that he has all the time muscle aching , proceeding the Flu shot .  Interval history from 05-16-14, Mrs. Brooke [name] underwent several additional tests including a review of her MRI of the brain (in the meantime since her initial visit). The patient's MRI showed some perpendicular most likely demyelinating lesions that would be more consistent with MS than with any microvascular disease or migraine related changes to the right matter.  This was followed with a CSF test. The spinal tap was collected on 04-30-14 and showed a glucose level of 60 a total protein level of 18 both in  normal range and only 4 white blood cells in normal range as well oligoclonal bands were tested and found positive. The lab report stated that the CSF contains more than 5 well defined gamma restriction bands. These were not present in her regular bloodstream. Oligoclonal bands are present in the CSF of about 90% of patients with multiple sclerosis. The opening pressure during the spinal tap was elevated this was also annotated by Dr. Rose Fillers the radiologist. We then also had performed a visual evoked potential test which returned normal.  The patient has 20/30 vision in both eyes and an EEG on 05-09-14. she fell asleep during the EEG, her EEG would be normal with 4 Hz wave discharges biparietal , since these were sleep related.  There was no photic entrainment noted and her heart rate was rhythmic at 62 bpm.  Interval history 09-29-14 [name] is here today and reports that she has not felt better since being on Tacfidera, I asked her to start taking the taxidermist applesauce and that seems to combat the hot flushes the past. The hot flashes a  side effect of the medication but a side effect of her disease. A rheumatological workup has been incomplete since she is uninsured at this time and the test cannot be performed. Given her brain MRI and 5 oligoclonal bands in her CSF she is treated for multiple sclerosis which is the very likely diagnosis in her case. 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 Her speech changed as she recalls, she "talked funny " , has vision changes.  I will refer to neuro-ophthalmology and Physical medicine for evaluation of physical limits, and to help define her disability. I attribute some of her limited movements to her morbid obesity.  REVIEW OF SYSTEMS: Out of a complete 14 system review of symptoms, the patient complains only of the following  symptoms, and all other reviewed systems are negative.  Fatigue, blurred vision, incontinence of bowel, restless leg, incontinence of bladder, back pain, aching muscles, muscle cramps, walking difficulty, heat intolerance, anemia, memory loss, headache, facial drooping, agitation, confusion, decreased concentration, depression, nervous/anxious  ALLERGIES: Allergies  Allergen Reactions  . Lexapro [Escitalopram Oxalate] Other (See Comments)    Suicidal thoughts, lowered libido  . Latex Hives    HOME MEDICATIONS: Outpatient Prescriptions Prior to Visit  Medication Sig Dispense Refill  . amitriptyline (ELAVIL) 25 MG tablet Take 25 mg by mouth at bedtime as needed. for sleep  2  . Dimethyl Fumarate 240 MG CPDR Take 1 capsule (240 mg total) by mouth 2 (two) times daily. 60 capsule 5  . ferrous sulfate 325 (65 FE) MG tablet Take 325 mg by mouth every morning.    . gabapentin (NEURONTIN) 300 MG capsule Take one i after lunch, and one at bed time. po (Patient taking differently: Take 300 mg by mouth 2 (two) times daily. Take one after lunch, and one at bedtime) 180 capsule 3  . Multiple Vitamin (MULTIVITAMIN) tablet Take 1 tablet by mouth daily.    . naproxen (NAPROSYN) 500 MG tablet Take 1 tablet (500 mg total) by mouth 2 (two) times daily with a meal. 20 tablet 0  . pseudoephedrine (SUDAFED 12 HOUR) 120 MG 12 hr tablet Take 1 tablet (120 mg total) by mouth 2 (two) times daily. 30 tablet 0   No facility-administered medications prior to visit.    PAST MEDICAL HISTORY: Past Medical History  Diagnosis Date  . Anxiety   . Edema   . Chronic back pain   . MS (multiple sclerosis) (HCC) 09/29/2014    PAST SURGICAL HISTORY: Past Surgical History  Procedure Laterality Date  . Wisdom tooth extraction    . Cholecystectomy N/A 10/07/2013    Procedure: LAPAROSCOPIC CHOLECYSTECTOMY;  Surgeon: Dalia Heading, MD;  Location: [facility];  Service: General;  Laterality: N/A;  . Laparoscopic appendectomy N/A  05/12/2015    Procedure: APPENDECTOMY LAPAROSCOPIC;  Surgeon: Franky Macho, MD;  Location: [facility];  Service: General;  Laterality: N/A;  . Appendectomy      FAMILY HISTORY: Family History  Problem Relation Age of Onset  . Diabetes Mother   . Colon polyps Mother     hx of cancer  . Other Mother     DDD Lumber, cervical  . Sleep apnea Father     SOCIAL HISTORY: Social History   Social History  . Marital Status: Single    Spouse Name: N/A  . Number of Children: N/A  . Years of Education:  2 y colleg   Occupational History  . work    Social History Main Topics  . Smoking status: Current Every Day Smoker    Last Attempt to Quit: 09/16/2007  . Smokeless tobacco: Never Used  . Alcohol Use: No  . Drug Use: No     Comment: no drug use since 2010  . Sexual Activity: No   Other Topics Concern  . Not on file   Social History Narrative   Drinks 1-2 large glasses of caffeine daily.            PHYSICAL EXAM  Filed Vitals:   07/15/15 0958  BP: 115/79  Pulse: 76  Height: 5\' 4"  (1.626 m)  Weight: 329 lb (149.233 kg)   Body mass index is 56.44 kg/(m^2).  Generalized: Well developed, in no acute distress, obese  Neurological examination  Mentation: Alert oriented to time, place, history taking. Follows all commands speech and language fluent Cranial nerve II-XII: Pupils were equal round reactive to light. Extraocular movements were full, visual field were full on confrontational test. Facial sensation and strength were normal. Uvula tongue midline. Head turning and shoulder shrug  were normal and symmetric. Motor: The motor testing reveals 5 over 5 strength of all 4 extremities. Good symmetric motor tone is noted throughout.  Sensory: Sensory testing is intact to soft touch on all 4 extremities. No evidence of extinction is noted.  Coordination: Cerebellar testing reveals good finger-nose-finger and heel-to-shin bilaterally.  Gait and station: Gait is normal. Tandem gait  is slightly unsteady. Romberg is negative. No drift is seen.  Reflexes: Deep tendon reflexes are symmetric and normal bilaterally.   DIAGNOSTIC DATA (LABS, IMAGING, TESTING) - I reviewed patient records, labs, notes, testing and imaging myself where available.  Lab Results  Component Value Date   WBC 11.0* 05/13/2015   HGB 11.3* 05/13/2015   HCT 32.6* 05/13/2015   MCV 84.9 05/13/2015   PLT 227 05/13/2015      Component Value Date/Time   NA 137 05/12/2015 1651   NA 141 12/30/2014 1231   K 4.0 05/12/2015 1651   CL 103 05/12/2015 1651   CO2 28 05/12/2015 1651   GLUCOSE 123* 05/12/2015 1651   GLUCOSE 89 12/30/2014 1231   BUN 9 05/12/2015 1651   BUN 7 12/30/2014 1231   CREATININE 0.76 05/12/2015 1651   CALCIUM 9.1 05/12/2015 1651   PROT 8.1 05/12/2015 1651   PROT 6.6 12/30/2014 1231   ALBUMIN 3.7 05/12/2015 1651   ALBUMIN 3.7 12/30/2014 1231   AST 20 05/12/2015 1651   ALT 26 05/12/2015 1651   ALKPHOS 67 05/12/2015 1651   BILITOT 0.5 05/12/2015 1651   BILITOT <0.2 12/30/2014 1231   GFRNONAA >60 05/12/2015 1651   GFRAA >60 05/12/2015 1651      ASSESSMENT AND PLAN 30 y.o. year old female  has a past medical history of Anxiety; Edema; Chronic back pain; and MS (multiple sclerosis) (HCC) (09/29/2014). here with:  1. Multiple sclerosis 2. anxiety  The patient's physical exam is unremarkable. I did not note any weakness or dragging of the left foot on exam. The patient will remain on Tecfidera for now. I will check blood work today. I advised the patient that I would consult with Dr. Epimenio Foot in regards to her treatment. She should follow-up with her primary care provider in regards to chronic back pain. Also advised patient that she could consult with a therapist or psychiatrist in regards to her ongoing anxiety and depression. She will follow-up  with our office in 3 months with Dr. Vickey Huger.   I spent 25 minutes with the patient. 50% of this time was spent counseling the patient on  her treatment options.   Butch Penny, MSN, NP-C 07/15/2015, 9:59 AM [facility] [address], [address] [place], [place] [address] [phone]

## 2015-07-15 NOTE — Patient Instructions (Signed)
Blood work today Press photographer If your symptoms worsen or you develop new symptoms please let us know.

## 2015-07-16 ENCOUNTER — Telehealth: Payer: Self-pay | Admitting: *Deleted

## 2015-07-16 LAB — COMPREHENSIVE METABOLIC PANEL
A/G RATIO: 1.4 (ref 1.2–2.2)
ALK PHOS: 75 IU/L (ref 39–117)
ALT: 20 IU/L (ref 0–32)
AST: 17 IU/L (ref 0–40)
Albumin: 4 g/dL (ref 3.5–5.5)
BILIRUBIN TOTAL: 0.3 mg/dL (ref 0.0–1.2)
BUN / CREAT RATIO: 9 (ref 9–23)
BUN: 6 mg/dL (ref 6–20)
CHLORIDE: 101 mmol/L (ref 96–106)
CO2: 26 mmol/L (ref 18–29)
Calcium: 9.3 mg/dL (ref 8.7–10.2)
Creatinine, Ser: 0.68 mg/dL (ref 0.57–1.00)
GFR calc non Af Amer: 120 mL/min/{1.73_m2} (ref >59)
GFR, EST AFRICAN AMERICAN: 139 mL/min/{1.73_m2} (ref >59)
GLUCOSE: 103 mg/dL — AB (ref 65–99)
Globulin, Total: 2.8 g/dL (ref 1.5–4.5)
POTASSIUM: 4.6 mmol/L (ref 3.5–5.2)
Sodium: 143 mmol/L (ref 134–144)
TOTAL PROTEIN: 6.8 g/dL (ref 6.0–8.5)

## 2015-07-16 LAB — CBC WITH DIFFERENTIAL/PLATELET
BASOS ABS: 0 10*3/uL (ref 0.0–0.2)
Basos: 0 %
EOS (ABSOLUTE): 0.1 10*3/uL (ref 0.0–0.4)
Eos: 1 %
HEMOGLOBIN: 12.5 g/dL (ref 11.1–15.9)
Hematocrit: 36.7 % (ref 34.0–46.6)
Immature Grans (Abs): 0 10*3/uL (ref 0.0–0.1)
Immature Granulocytes: 0 %
LYMPHS ABS: 1.1 10*3/uL (ref 0.7–3.1)
Lymphs: 24 %
MCH: 29.8 pg (ref 26.6–33.0)
MCHC: 34.1 g/dL (ref 31.5–35.7)
MCV: 88 fL (ref 79–97)
MONOCYTES: 7 %
Monocytes Absolute: 0.3 10*3/uL (ref 0.1–0.9)
NEUTROS ABS: 3.2 10*3/uL (ref 1.4–7.0)
Neutrophils: 68 %
PLATELETS: 231 10*3/uL (ref 150–379)
RBC: 4.19 x10E6/uL (ref 3.77–5.28)
RDW: 14.5 % (ref 12.3–15.4)
WBC: 4.7 10*3/uL (ref 3.4–10.8)

## 2015-07-16 NOTE — Telephone Encounter (Signed)
Patient returned Mary Clare's call. °

## 2015-07-16 NOTE — Progress Notes (Signed)
I agree with the assessment and plan as directed by NP .The patient is known to me .   Yamilka Lopiccolo, MD  

## 2015-07-16 NOTE — Telephone Encounter (Signed)
Attempted to reach patient re: lab results. Person who answered phone stated she was in the shower; this person was not found on DPR. Will call back later.

## 2015-07-16 NOTE — Telephone Encounter (Signed)
Spoke with patient and informed her, pre Brooke Hoose, NP her blood work is okay. She verbalized understanding, appreciation.

## 2015-08-31 ENCOUNTER — Telehealth: Payer: Self-pay | Admitting: Neurology

## 2015-08-31 NOTE — Telephone Encounter (Signed)
Error

## 2015-10-11 ENCOUNTER — Emergency Department (HOSPITAL_COMMUNITY)
Admission: EM | Admit: 2015-10-11 | Discharge: 2015-10-11 | Disposition: A | Discharge status: Home / Self Care | Payer: Medicaid Other | Attending: Emergency Medicine | Admitting: Emergency Medicine

## 2015-10-11 ENCOUNTER — Emergency Department (HOSPITAL_COMMUNITY): Payer: Medicaid Other

## 2015-10-11 ENCOUNTER — Encounter (HOSPITAL_COMMUNITY): Payer: Self-pay

## 2015-10-11 DIAGNOSIS — Z79899 Other long term (current) drug therapy: Secondary | ICD-10-CM | POA: Diagnosis not present

## 2015-10-11 DIAGNOSIS — F17200 Nicotine dependence, unspecified, uncomplicated: Secondary | ICD-10-CM | POA: Diagnosis not present

## 2015-10-11 DIAGNOSIS — X501XXA Overexertion from prolonged static or awkward postures, initial encounter: Secondary | ICD-10-CM | POA: Insufficient documentation

## 2015-10-11 DIAGNOSIS — Y929 Unspecified place or not applicable: Secondary | ICD-10-CM | POA: Insufficient documentation

## 2015-10-11 DIAGNOSIS — Y999 Unspecified external cause status: Secondary | ICD-10-CM | POA: Diagnosis not present

## 2015-10-11 DIAGNOSIS — S99912A Unspecified injury of left ankle, initial encounter: Secondary | ICD-10-CM | POA: Diagnosis present

## 2015-10-11 DIAGNOSIS — S93402A Sprain of unspecified ligament of left ankle, initial encounter: Secondary | ICD-10-CM | POA: Diagnosis not present

## 2015-10-11 DIAGNOSIS — Y939 Activity, unspecified: Secondary | ICD-10-CM | POA: Diagnosis not present

## 2015-10-11 MED ORDER — IBUPROFEN 800 MG PO TABS: 800.0000 mg | ORAL_TABLET | Freq: Three times a day (TID) | ORAL | 0 refills | Status: DC

## 2015-10-11 MED ORDER — IBUPROFEN 800 MG PO TABS
800.0000 mg | ORAL_TABLET | Freq: Once | ORAL | Status: AC
  Administered 2015-10-11: 800 mg via ORAL
  Filled 2015-10-11: qty 1

## 2015-10-11 MED ORDER — TRAMADOL HCL 50 MG PO TABS
50.0000 mg | ORAL_TABLET | Freq: Once | ORAL | Status: AC
  Administered 2015-10-11: 50 mg via ORAL
  Filled 2015-10-11: qty 1

## 2015-10-11 MED ORDER — HYDROCODONE-ACETAMINOPHEN 5-325 MG PO TABS: 1.0000 | ORAL_TABLET | ORAL | 0 refills | Status: DC | PRN

## 2015-10-11 NOTE — Discharge Instructions (Signed)
Wear the ASO and use crutches to avoid weight bearing.  Use ice and elevation as much as possible for the next several days to help reduce the swelling.  Take the medications prescribed including ibuprofen for inflammation and hydrocodone if needed for additional pain relief.   This will make you drowsy - do not drive within 4 hours of taking this medication.  Use the ibuprofen also for inflammation.  Call your doctor for a recheck of your injury in 1 week if it is not significantly improving.  You may benefit from physical therapy of your ankle if it is not getting better over the next week.

## 2015-10-11 NOTE — ED Triage Notes (Signed)
Larey Seat today and hurt left ankle.  Unable to walk on it.

## 2015-10-11 NOTE — ED Provider Notes (Signed)
[facility] Provider Note   CSN: [id number] Arrival date & time: 10/11/15  2039     History   Chief Complaint Chief Complaint  Patient presents with  . Ankle Pain    HPI Brooke [name] is a 30 y.o. female presenting with left ankle pain and swelling since she fell and twisted the left ankle.  Pain is aching, constant and worse with palpation, movement and weight bearing.  The patient has been unable to weight bear since the event.  There is no radiation of pain and the patient denies numbness distal to the injury site.  The patients treatment prior to arrival included rest and elevation. .  The history is provided by the patient.    Past Medical History:  Diagnosis Date  . Anxiety   . Chronic back pain   . Edema   . MS (multiple sclerosis) (HCC) 09/29/2014    Patient Active Problem List   Diagnosis Date Noted  . Acute appendicitis 05/12/2015  . MS (multiple sclerosis) (HCC) 09/29/2014  . Positive ANA (antinuclear antibody) 07/02/2014  . Abnormal MRI of head 04/22/2014  . Transient diplopia 04/22/2014  . 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 Spell of memory loss 04/22/2014  . Recurrent falls while walking 04/22/2014    Past Surgical History:  Procedure Laterality Date  . APPENDECTOMY    . CHOLECYSTECTOMY N/A 10/07/2013   Procedure: LAPAROSCOPIC CHOLECYSTECTOMY;  Surgeon: Dalia Heading, MD;  Location: [facility];  Service: General;  Laterality: N/A;  . LAPAROSCOPIC APPENDECTOMY N/A 05/12/2015   Procedure: APPENDECTOMY LAPAROSCOPIC;  Surgeon: Franky Macho, MD;  Location: [facility];  Service: General;  Laterality: N/A;  . WISDOM TOOTH EXTRACTION      OB History    Gravida Para Term Preterm AB Living   2       2 0   SAB TAB Ectopic Multiple Live Births   1   1           Home Medications    Prior to Admission medications   Medication Sig Start Date End Date Taking? Authorizing Provider  amitriptyline (ELAVIL) 25 MG tablet Take 25 mg by mouth at bedtime as needed. for sleep  04/21/15  Yes Historical Provider, MD  Dimethyl Fumarate 240 MG CPDR Take 1 capsule (240 mg total) by mouth 2 (two) times daily. 05/28/15  Yes Carmen Dohmeier, MD  ferrous sulfate 325 (65 FE) MG tablet Take 325 mg by mouth every morning.   Yes Historical Provider, MD  gabapentin (NEURONTIN) 300 MG capsule Take one i after lunch, and one at bed time. po Patient taking differently: Take 300 mg by mouth 2 (two) times daily. Take one after lunch, and one at bedtime 04/01/15  Yes Melvyn Novas, MD  Multiple Vitamin (MULTIVITAMIN) tablet Take 1 tablet by mouth daily.   Yes Historical Provider, MD  HYDROcodone-acetaminophen (NORCO/VICODIN) 5-325 MG tablet Take 1 tablet by mouth every 4 (four) hours as needed. 10/11/15   Burgess Amor, PA-C  ibuprofen (ADVIL,MOTRIN) 800 MG tablet Take 1 tablet (800 mg total) by mouth 3 (three) times daily. 10/11/15   Burgess Amor, PA-C    Family History Family History  Problem Relation Age of Onset  . Diabetes Mother   . Colon polyps Mother     hx of cancer  . Other Mother     DDD Lumber, cervical  . Sleep apnea Father     Social History Social History  Substance Use Topics  . Smoking status: Current Every Day Smoker  Last attempt to quit: 09/16/2007  . Smokeless tobacco: Never Used  . Alcohol use No     Allergies   Lexapro [escitalopram oxalate] and Latex   Review of Systems Review of Systems  Musculoskeletal: Positive for arthralgias and joint swelling.  Skin: Negative for wound.  Neurological: Negative for weakness and numbness.     Physical Exam Updated Vital Signs BP 121/62 (BP Location: Right Arm)   Pulse 82   Temp 99.2 F (37.3 C) (Oral)   Resp 16   Ht 5\' 4"  (1.626 m)   Wt 134.7 kg   LMP 09/21/2015 (Exact Date)   SpO2 100%   BMI 50.98 kg/m   Physical Exam  Constitutional: She appears well-developed and well-nourished.  HENT:  Head: Normocephalic.  Cardiovascular: Normal rate and intact distal pulses.  Exam reveals no decreased  pulses.   Pulses:      Dorsalis pedis pulses are 2+ on the right side, and 2+ on the left side.       Posterior tibial pulses are 2+ on the right side, and 2+ on the left side.  Musculoskeletal: She exhibits edema and tenderness.       Left ankle: She exhibits decreased range of motion and swelling. She exhibits no ecchymosis, no deformity, no laceration and normal pulse. Tenderness. Lateral malleolus tenderness found. No head of 5th metatarsal and no proximal fibula tenderness found. Achilles tendon normal.  Neurological: She is alert. No sensory deficit.  Skin: Skin is warm, dry and intact.  Nursing note and vitals reviewed.    ED Treatments / Results  Labs (all labs ordered are listed, but only abnormal results are displayed) Labs Reviewed - No data to display  EKG  EKG Interpretation None       Radiology Dg Ankle Complete Left  Result Date: 10/11/2015 CLINICAL DATA:  Left ankle pain after fall tonight. Initial encounter. EXAM: LEFT ANKLE COMPLETE - 3+ VIEW COMPARISON:  None available FINDINGS: Limited lateral positioning due to obliquity and plantar flexion. Soft tissue swelling most notably about the lateral malleolus. Negative for fracture or subluxation. IMPRESSION: Soft tissue swelling without fracture. Electronically Signed   By: Marnee SpringJonathon  Watts M.D.   On: 10/11/2015 21:57    Procedures Procedures (including critical care time)  Medications Ordered in ED Medications  ibuprofen (ADVIL,MOTRIN) tablet 800 mg (800 mg Oral Given 10/11/15 2243)  traMADol (ULTRAM) tablet 50 mg (50 mg Oral Given 10/11/15 2243)     Initial Impression / Assessment and Plan / ED Course  I have reviewed the triage vital signs and the nursing notes.  Pertinent labs & imaging results that were available during my care of the patient were reviewed by me and considered in my medical decision making (see chart for details).  Clinical Course    ASO and crutches provided.  Cap refill normal after  ASO applied.  RICE, referral to pcp for recheck if pain symptoms and swelling are not better over the next week.    Final Clinical Impressions(s) / ED Diagnoses   Final diagnoses:  Ankle sprain, left, initial encounter    New Prescriptions New Prescriptions   HYDROCODONE-ACETAMINOPHEN (NORCO/VICODIN) 5-325 MG TABLET    Take 1 tablet by mouth every 4 (four) hours as needed.   IBUPROFEN (ADVIL,MOTRIN) 800 MG TABLET    Take 1 tablet (800 mg total) by mouth 3 (three) times daily.     Burgess AmorJulie Paraskevi Funez, PA-C 10/11/15 [id number]    Bethann BerkshireJoseph Zammit, MD 10/12/15 [phone]

## 2015-10-15 ENCOUNTER — Ambulatory Visit (INDEPENDENT_AMBULATORY_CARE_PROVIDER_SITE_OTHER): Payer: Medicaid Other | Admitting: Neurology

## 2015-10-15 ENCOUNTER — Encounter: Payer: Self-pay | Admitting: Neurology

## 2015-10-15 VITALS — BP 130/72 | HR 76 | Resp 20 | Ht 64.0 in | Wt 313.0 lb

## 2015-10-15 DIAGNOSIS — G35 Multiple sclerosis: Secondary | ICD-10-CM

## 2015-10-15 DIAGNOSIS — R296 Repeated falls: Secondary | ICD-10-CM | POA: Diagnosis not present

## 2015-10-15 MED ORDER — DIMETHYL FUMARATE 240 MG PO CPDR: 240.0000 mg | DELAYED_RELEASE_CAPSULE | Freq: Two times a day (BID) | ORAL | 5 refills | Status: DC

## 2015-10-15 MED ORDER — GABAPENTIN 300 MG PO CAPS: 300.0000 mg | ORAL_CAPSULE | Freq: Two times a day (BID) | ORAL | 5 refills | Status: DC

## 2015-10-15 NOTE — Patient Instructions (Signed)
Possible OCREVUS medication .

## 2015-10-15 NOTE — Progress Notes (Signed)
PATIENT: Brooke [name] DOB: June 13, 1985  REASON FOR VISIT: follow up- multiple sclerosis HISTORY FROM: patient  HISTORY OF PRESENT ILLNESS: Brooke [name] is a 30 year old female with a history of multiple sclerosis. She returns today for follow-up. She remains on Tecfidera and is tolerating it well. At her last visit with Dr. Vickey Huger they were considering  switching to Tysabri however the patient is blood work revealed a positive JCV so the patient was advised to remain on Tecfidera .  The patient does not report any new symptoms however she does still complain of subjective weakness in both lower extremities. She states that she will drag the left leg when ambulating for a longer distance . She reports that she has not had any additional falls- but she does occasionally stumble.  She does feel that her balance is off and her family member agrees with this.  She also reports blurry vision. She denies any changes with the bowels or bladder although she does feel that she does have some urgency.  The patient states that she has noticed a tremor primarily in the left arm, and it is not always present. . She states that she has noticed this when she is holding a cup. She also at times will feel shaky on the inside. She does report some anxiety and possible depression.  She states that she also has chronic back pain she reports that her primary care would not start her on any pain medication. She is requesting to be sent to a pain specialist ( deferred to PCP ) .  She also reports that her primary care would not place her on any anxiety medication because he did not want her to become addicted to this medication. I explained that SSRI medication is not addictive.  She returns today for an evaluation, brought crutches.    HISTORY (Brooke [name]): Brooke [name] is a 30 year old female, and recently widowed  with a history of multiple sclerosis. She returns 04-01-15 for follow-up. She is currently on  Tecfidera and tolerating it well although she states that she feels that it is "not working." She states that she continues to have the episodes that she had before starting tecfidera. These episodes include slurred speech (at times), jitteriness and dragging the right leg. Some times she shakes in either leg. These episodes do not happen daily but rather intermittently. She confessed to missing oral doses. We discussed transfer to [facility]. [name]'s family also reports that she indeed has fallen several times in the last 6 months, luckily without any injuries. We are meeting today to discuss her recent lab results and also the MRI from 01/21/2015. This was compared to an MRI from January of the same year; there was a new focus seen in the left anterior internal capsule that was not present on the January films, but there has been a resolution of another focus.  The MRI is consistent with multiple sclerosis. Her labs were also reviewed and were entirely normal. Her CBC with differential was entirely normal limits dated and resulted on 12/31/2014.     REVIEW OF SYSTEMS: Out of a complete 14 system review of symptoms, the patient complains only of the following symptoms, and all other reviewed systems are negative.  Fatigue, blurred vision, incontinence of bowel, restless leg, incontinence of bladder, back pain, aching muscles, muscle cramps, walking difficulty, heat intolerance, anemia, memory loss, headache, facial drooping, agitation, confusion, decreased concentration, depression, nervous/anxious  ALLERGIES: Allergies  Allergen Reactions  . Lexapro [Escitalopram  Oxalate] Other (See Comments)    Suicidal thoughts, lowered libido  . Latex Hives    HOME MEDICATIONS: Outpatient Medications Prior to Visit  Medication Sig Dispense Refill  . amitriptyline (ELAVIL) 25 MG tablet Take 25 mg by mouth at bedtime as needed. for sleep  2  . Dimethyl Fumarate 240 MG CPDR Take 1 capsule (240 mg  total) by mouth 2 (two) times daily. 60 capsule 5  . ferrous sulfate 325 (65 FE) MG tablet Take 325 mg by mouth every morning.    . gabapentin (NEURONTIN) 300 MG capsule Take one i after lunch, and one at bed time. po (Patient taking differently: Take 300 mg by mouth 2 (two) times daily. Take one after lunch, and one at bedtime) 180 capsule 3  . HYDROcodone-acetaminophen (NORCO/VICODIN) 5-325 MG tablet Take 1 tablet by mouth every 4 (four) hours as needed. 10 tablet 0  . ibuprofen (ADVIL,MOTRIN) 800 MG tablet Take 1 tablet (800 mg total) by mouth 3 (three) times daily. 21 tablet 0  . Multiple Vitamin (MULTIVITAMIN) tablet Take 1 tablet by mouth daily.     No facility-administered medications prior to visit.     PAST MEDICAL HISTORY: Past Medical History:  Diagnosis Date  . Anxiety   . Chronic back pain   . Edema   . MS (multiple sclerosis) (HCC) 09/29/2014    PAST SURGICAL HISTORY: Past Surgical History:  Procedure Laterality Date  . APPENDECTOMY    . CHOLECYSTECTOMY N/A 10/07/2013   Procedure: LAPAROSCOPIC CHOLECYSTECTOMY;  Surgeon: Dalia Heading, MD;  Location: [facility];  Service: General;  Laterality: N/A;  . LAPAROSCOPIC APPENDECTOMY N/A 05/12/2015   Procedure: APPENDECTOMY LAPAROSCOPIC;  Surgeon: Franky Macho, MD;  Location: [facility];  Service: General;  Laterality: N/A;  . WISDOM TOOTH EXTRACTION      FAMILY HISTORY: Family History  Problem Relation Age of Onset  . Diabetes Mother   . Colon polyps Mother     hx of cancer  . Other Mother     DDD Lumber, cervical  . Sleep apnea Father     SOCIAL HISTORY: Social History   Social History  . Marital status: Single    Spouse name: N/A  . Number of children: N/A  . Years of education: 2 y colleg   Occupational History  . work    Social History Main Topics  . Smoking status: Current Every Day Smoker    Last attempt to quit: 09/16/2007  . Smokeless tobacco: Never Used  . Alcohol use No  . Drug use: No     Comment: no  drug use since 2010  . Sexual activity: No   Other Topics Concern  . Not on file   Social History Narrative   Drinks 1-2 large glasses of caffeine daily.            PHYSICAL EXAM  Vitals:   10/15/15 1025  BP: 130/72  Pulse: 76  Resp: 20  Weight: (!) 313 lb (142 kg)  Height: 5\' 4"  (1.626 m)   Body mass index is 53.73 kg/m.  Generalized: Well developed, in no acute distress, obese  Neurological examination  Mentation: Alert oriented to time, place, history taking. Follows all commands speech and language fluent Cranial nerve- Patient reports no change in taste or smell, Pupils were equal round reactive to light. Extraocular movements were full, visual field were full on confrontational test. Facial sensation and strength were normal. Uvula tongue midline. Head turning and shoulder shrug  were normal and  symmetric. Tongue protruding in either cheek is intact Motor: 5/ 5 strength ,symmetric motor tone is noted throughout her upper extremities..  Sensory: intact to soft touch on all 4 extremities. Coordination:  finger-nose-finger intact.  Gait and station: Gait is normal. Tandem gait is slightly unsteady. Romberg is negative. No drift is seen.  Reflexes: Deep tendon reflexes are symmetric in the upper extremities bilaterally, I noticed her left patella reflex is much more brisk than her right. She presents with upgoing toes bilaterally.[name].   DIAGNOSTIC DATA (LABS, IMAGING, TESTING) - I reviewed patient records, labs, notes, testing and imaging myself where available.  Lab Results  Component Value Date   WBC 4.7 07/15/2015   HGB 11.3 (L) 05/13/2015   HCT 36.7 07/15/2015   MCV 88 07/15/2015   PLT 231 07/15/2015      Component Value Date/Time   NA 143 07/15/2015 1051   K 4.6 07/15/2015 1051   CL 101 07/15/2015 1051   CO2 26 07/15/2015 1051   GLUCOSE 103 (H) 07/15/2015 1051   GLUCOSE 123 (H) 05/12/2015 1651   BUN 6 07/15/2015 1051   CREATININE 0.68 07/15/2015 1051    CALCIUM 9.3 07/15/2015 1051   PROT 6.8 07/15/2015 1051   ALBUMIN 4.0 07/15/2015 1051   AST 17 07/15/2015 1051   ALT 20 07/15/2015 1051   ALKPHOS 75 07/15/2015 1051   BILITOT 0.3 07/15/2015 1051   GFRNONAA 120 07/15/2015 1051   GFRAA 139 07/15/2015 1051      ASSESSMENT AND PLAN 30 y.o. year old female  has a past medical history of Anxiety; Chronic back pain; Edema; and MS (multiple sclerosis) (HCC) (09/29/2014). here with:  1. Multiple sclerosis 2. Anxiety 3.  "Legs buckle ", gait stability decreased.   The patient's physical exam is unremarkable. I did not note any weakness or dragging of the left foot on exam. The patient will remain on Tecfidera for now. I will check blood work today.  I advised the patient that I offer a consult with Dr. Epimenio FootSater in regards to her treatment.  She should follow-up with her primary care provider in regards to chronic back pain.  Also advised patient that she could consult with a therapist or psychiatrist in regards to her ongoing anxiety and depression.  She will follow-up with our office in 6 months with NP.  I spent 25 minutes with the patient. 50% of this time was spent counseling the patient on her treatment options. I will present her case to dr Epimenio FootSater for possible OCREVUS, will need the prep labs today. MRI repeat in 6 month.    Artyom Stencel, MD  10/15/2015, 10:35 AM [facility] [address], [address] [place], [place] [address] [phone]

## 2015-10-15 NOTE — Addendum Note (Signed)
Addended by: Geronimo Running A on: 10/15/2015 10:51 AM   Modules accepted: Orders

## 2015-10-16 LAB — COMPREHENSIVE METABOLIC PANEL
A/G RATIO: 1.2 (ref 1.2–2.2)
ALT: 11 IU/L (ref 0–32)
AST: 11 IU/L (ref 0–40)
Albumin: 3.7 g/dL (ref 3.5–5.5)
Alkaline Phosphatase: 69 IU/L (ref 39–117)
BUN/Creatinine Ratio: 10 (ref 9–23)
BUN: 7 mg/dL (ref 6–20)
Bilirubin Total: 0.2 mg/dL (ref 0.0–1.2)
CALCIUM: 9 mg/dL (ref 8.7–10.2)
CO2: 26 mmol/L (ref 18–29)
Chloride: 103 mmol/L (ref 96–106)
Creatinine, Ser: 0.7 mg/dL (ref 0.57–1.00)
GFR, EST AFRICAN AMERICAN: 137 mL/min/{1.73_m2} (ref >59)
GFR, EST NON AFRICAN AMERICAN: 119 mL/min/{1.73_m2} (ref >59)
GLOBULIN, TOTAL: 3.2 g/dL (ref 1.5–4.5)
Glucose: 89 mg/dL (ref 65–99)
POTASSIUM: 4.5 mmol/L (ref 3.5–5.2)
SODIUM: 143 mmol/L (ref 134–144)
TOTAL PROTEIN: 6.9 g/dL (ref 6.0–8.5)

## 2015-10-16 LAB — CBC WITH DIFFERENTIAL
BASOS: 0 %
Basophils Absolute: 0 10*3/uL (ref 0.0–0.2)
EOS (ABSOLUTE): 0.1 10*3/uL (ref 0.0–0.4)
EOS: 1 %
HEMATOCRIT: 36.8 % (ref 34.0–46.6)
Hemoglobin: 12.1 g/dL (ref 11.1–15.9)
Immature Grans (Abs): 0 10*3/uL (ref 0.0–0.1)
Immature Granulocytes: 0 %
Lymphocytes Absolute: 0.9 10*3/uL (ref 0.7–3.1)
Lymphs: 26 %
MCH: 29.4 pg (ref 26.6–33.0)
MCHC: 32.9 g/dL (ref 31.5–35.7)
MCV: 90 fL (ref 79–97)
MONOS ABS: 0.3 10*3/uL (ref 0.1–0.9)
Monocytes: 8 %
NEUTROS ABS: 2.2 10*3/uL (ref 1.4–7.0)
Neutrophils: 65 %
RBC: 4.11 x10E6/uL (ref 3.77–5.28)
RDW: 13.8 % (ref 12.3–15.4)
WBC: 3.5 10*3/uL (ref 3.4–10.8)

## 2015-10-16 LAB — HEPATITIS B SURFACE ANTIBODY,QUALITATIVE: Hep B Surface Ab, Qual: NONREACTIVE

## 2015-10-16 LAB — HEPATITIS B SURFACE ANTIGEN: Hepatitis B Surface Ag: NEGATIVE

## 2015-10-16 LAB — HEPATITIS B CORE ANTIBODY, TOTAL: Hep B Core Total Ab: NEGATIVE

## 2015-10-18 LAB — QUANTIFERON IN TUBE
QFT TB AG MINUS NIL VALUE: 0.01 IU/mL
QUANTIFERON MITOGEN VALUE: 2.56 IU/mL
QUANTIFERON TB AG VALUE: 0.03 [IU]/mL
QUANTIFERON TB GOLD: NEGATIVE
Quantiferon Nil Value: 0.02 IU/mL

## 2015-10-18 LAB — QUANTIFERON TB GOLD ASSAY (BLOOD)

## 2015-10-19 ENCOUNTER — Telehealth: Payer: Self-pay

## 2015-10-19 NOTE — Telephone Encounter (Signed)
-----   Message from Melvyn Novas, MD sent at 10/16/2015 12:21 PM EDT ----- Your metabolic panel and your blood cell counts look well, you tested negative for hepatitis B and your  TB test is pending.

## 2015-10-19 NOTE — Telephone Encounter (Signed)
I spoke to pt and advised her that her labs were ok, negative for hepatitis and for TB (test result available now, it is negative). Pt verbalized understanding of results.   Pt reports to me that check-out told her that she could not make any further appts with [facility] because of an outstanding bill. She says that medicaid should have "retoractively" paid her bills. I advised her that I would send this message to Angie to give pt a call to discuss. Pt verbalized understanding.

## 2016-01-07 ENCOUNTER — Other Ambulatory Visit: Payer: Self-pay | Admitting: Neurology

## 2016-01-07 DIAGNOSIS — R296 Repeated falls: Secondary | ICD-10-CM

## 2016-01-07 DIAGNOSIS — G35 Multiple sclerosis: Secondary | ICD-10-CM

## 2016-01-07 MED ORDER — GABAPENTIN 300 MG PO CAPS: 300.0000 mg | ORAL_CAPSULE | Freq: Two times a day (BID) | ORAL | 5 refills | Status: DC

## 2016-01-07 NOTE — Telephone Encounter (Signed)
Patient called to request refill of gabapentin (NEURONTIN) 300 MG capsule °

## 2016-01-07 NOTE — Telephone Encounter (Signed)
RX for gabapentin faxed to [facility]. Received a receipt of confirmation.

## 2016-01-07 NOTE — Telephone Encounter (Signed)
I spoke to pt. She says that [facility] does not send pt any other medications except for tecfidera. Pt is asking that her [facility]abapentin ref[facility] to [facility] [address] [facility] [address] in [place]. Will send to Dr. Vickey Huger for review.

## 2016-01-07 NOTE — Telephone Encounter (Signed)
It appears that Dr. Vickey Huger refilled pt's gabapentin on 10/15/2015 with 5 refills and sent the RX to [facility]. Pt should not need another refill.  I called pt to discuss. No answer, left a message asking pt to call me back.

## 2016-01-16 ENCOUNTER — Encounter (HOSPITAL_COMMUNITY): Payer: Self-pay | Admitting: Emergency Medicine

## 2016-01-16 ENCOUNTER — Emergency Department (HOSPITAL_COMMUNITY)
Admission: EM | Admit: 2016-01-16 | Discharge: 2016-01-16 | Disposition: A | Discharge status: Home / Self Care | Payer: Medicaid Other | Attending: Emergency Medicine | Admitting: Emergency Medicine

## 2016-01-16 ENCOUNTER — Emergency Department (HOSPITAL_COMMUNITY): Payer: Medicaid Other

## 2016-01-16 DIAGNOSIS — Z79899 Other long term (current) drug therapy: Secondary | ICD-10-CM | POA: Insufficient documentation

## 2016-01-16 DIAGNOSIS — F17200 Nicotine dependence, unspecified, uncomplicated: Secondary | ICD-10-CM | POA: Diagnosis not present

## 2016-01-16 DIAGNOSIS — G43909 Migraine, unspecified, not intractable, without status migrainosus: Secondary | ICD-10-CM | POA: Diagnosis present

## 2016-01-16 DIAGNOSIS — R51 Headache: Secondary | ICD-10-CM | POA: Insufficient documentation

## 2016-01-16 DIAGNOSIS — R519 Headache, unspecified: Secondary | ICD-10-CM

## 2016-01-16 LAB — CBC
HEMATOCRIT: 38.2 % (ref 36.0–46.0)
HEMOGLOBIN: 13.1 g/dL (ref 12.0–15.0)
MCH: 30.1 pg (ref 26.0–34.0)
MCHC: 34.3 g/dL (ref 30.0–36.0)
MCV: 87.8 fL (ref 78.0–100.0)
Platelets: 247 10*3/uL (ref 150–400)
RBC: 4.35 MIL/uL (ref 3.87–5.11)
RDW: 13 % (ref 11.5–15.5)
WBC: 4.8 10*3/uL (ref 4.0–10.5)

## 2016-01-16 LAB — URINALYSIS, ROUTINE W REFLEX MICROSCOPIC
Bilirubin Urine: NEGATIVE
GLUCOSE, UA: NEGATIVE mg/dL
HGB URINE DIPSTICK: NEGATIVE
Ketones, ur: NEGATIVE mg/dL
LEUKOCYTES UA: NEGATIVE
Nitrite: NEGATIVE
PH: 6 (ref 5.0–8.0)
PROTEIN: NEGATIVE mg/dL
SPECIFIC GRAVITY, URINE: 1.015 (ref 1.005–1.030)

## 2016-01-16 LAB — PREGNANCY, URINE: Preg Test, Ur: NEGATIVE

## 2016-01-16 MED ORDER — SODIUM CHLORIDE 0.9 % IV SOLN
1000.0000 mL | Freq: Once | INTRAVENOUS | Status: AC
  Administered 2016-01-16: 1000 mL via INTRAVENOUS

## 2016-01-16 MED ORDER — METOCLOPRAMIDE HCL 5 MG/ML IJ SOLN
10.0000 mg | Freq: Once | INTRAMUSCULAR | Status: AC
  Administered 2016-01-16: 10 mg via INTRAVENOUS
  Filled 2016-01-16: qty 2

## 2016-01-16 MED ORDER — DIPHENHYDRAMINE HCL 50 MG/ML IJ SOLN
25.0000 mg | Freq: Once | INTRAMUSCULAR | Status: AC
  Administered 2016-01-16: 25 mg via INTRAVENOUS
  Filled 2016-01-16: qty 1

## 2016-01-16 MED ORDER — KETOROLAC TROMETHAMINE 10 MG PO TABS: 10.0000 mg | ORAL_TABLET | Freq: Four times a day (QID) | ORAL | 0 refills | Status: DC | PRN

## 2016-01-16 MED ORDER — SODIUM CHLORIDE 0.9 % IV SOLN: 1000.0000 mL | INTRAVENOUS | Status: DC

## 2016-01-16 NOTE — ED Provider Notes (Signed)
[facility] Provider Note   CSN: [id number] Arrival date & time: 01/16/16  1220     History   Chief Complaint Chief Complaint  Patient presents with  . Migraine    HPI Brooke [name] is a 30 y.o. female.  Pt present to the ED today with migraines.  The pt said they come and go.  The pt has not been previously diagnosed with migraines, but does have MS that was diagnosed in March.  Also, her husband was killed last Christmas Eve and she's been thinking about him a lot.  Pt also c/o easily bruising.  She denies n/v or blurred vision.  She denies any neurologic changes.      Past Medical History:  Diagnosis Date  . Anxiety   . Chronic back pain   . Edema   . MS (multiple sclerosis) (HCC) 09/29/2014    Patient Active Problem List   Diagnosis Date Noted  . Acute appendicitis 05/12/2015  . MS (multiple sclerosis) (HCC) 09/29/2014  . Positive ANA (antinuclear antibody) 07/02/2014  . Abnormal MRI of head 04/22/2014  . Transient diplopia 04/22/2014  . 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 Spell of memory loss 04/22/2014  . Recurrent falls while walking 04/22/2014    Past Surgical History:  Procedure Laterality Date  . APPENDECTOMY    . CHOLECYSTECTOMY N/A 10/07/2013   Procedure: LAPAROSCOPIC CHOLECYSTECTOMY;  Surgeon: Dalia HeadingMark A Jenkins, MD;  Location: [facility];  Service: General;  Laterality: N/A;  . LAPAROSCOPIC APPENDECTOMY N/A 05/12/2015   Procedure: APPENDECTOMY LAPAROSCOPIC;  Surgeon: Franky MachoMark Jenkins, MD;  Location: [facility];  Service: General;  Laterality: N/A;  . WISDOM TOOTH EXTRACTION      OB History    Gravida Para Term Preterm AB Living   2       2 0   SAB TAB Ectopic Multiple Live Births   1   1           Home Medications    Prior to Admission medications   Medication Sig Start Date End Date Taking? Authorizing Provider  Dimethyl Fumarate 240 MG CPDR Take 1 capsule (240 mg total) by mouth 2 (two) times daily. 10/15/15  Yes Carmen Dohmeier, MD  ferrous sulfate 325  (65 FE) MG tablet Take 325 mg by mouth every morning.   Yes Historical Provider, MD  gabapentin (NEURONTIN) 300 MG capsule Take 1 capsule (300 mg total) by mouth 2 (two) times daily. Take one after lunch, and one at bedtime 01/07/16  Yes Melvyn Novasarmen Dohmeier, MD  Multiple Vitamin (MULTIVITAMIN) tablet Take 1 tablet by mouth daily.   Yes Historical Provider, MD  ibuprofen (ADVIL,MOTRIN) 800 MG tablet Take 1 tablet (800 mg total) by mouth 3 (three) times daily. 10/11/15   Burgess AmorJulie Idol, PA-C  ketorolac (TORADOL) 10 MG tablet Take 1 tablet (10 mg total) by mouth every 6 (six) hours as needed. 01/16/16   Jacalyn LefevreJulie Clell Trahan, MD    Family History Family History  Problem Relation Age of Onset  . Diabetes Mother   . Colon polyps Mother     hx of cancer  . Other Mother     DDD Lumber, cervical  . Sleep apnea Father     Social History Social History  Substance Use Topics  . Smoking status: Current Every Day Smoker    Last attempt to quit: 09/16/2007  . Smokeless tobacco: Never Used  . Alcohol use No     Allergies   Lexapro [escitalopram oxalate] and Latex   Review of Systems  Review of Systems  Neurological: Positive for headaches.  Hematological: Bruises/bleeds easily.  All other systems reviewed and are negative.    Physical Exam Updated Vital Signs BP 107/61 (BP Location: Left Arm)   Pulse 62   Temp 98.4 F (36.9 C) (Oral)   Resp 16   Ht 5\' 4"  (1.626 m)   Wt (!) 311 lb (141.1 kg)   LMP 01/02/2016 (Approximate) Comment: neg u preg  SpO2 100%   BMI 53.38 kg/m   Physical Exam  Constitutional: She is oriented to person, place, and time. She appears well-developed and well-nourished.  HENT:  Head: Normocephalic and atraumatic.  Right Ear: External ear normal.  Left Ear: External ear normal.  Nose: Nose normal.  Mouth/Throat: Oropharynx is clear and moist.  Eyes: Conjunctivae and EOM are normal. Pupils are equal, round, and reactive to light.  Neck: Normal range of motion. Neck  supple.  Cardiovascular: Normal rate, regular rhythm, normal heart sounds and intact distal pulses.   Pulmonary/Chest: Effort normal and breath sounds normal.  Abdominal: Soft. Bowel sounds are normal.  Musculoskeletal: Normal range of motion.  Neurological: She is alert and oriented to person, place, and time.  Skin: Skin is warm and dry.  Psychiatric: She has a normal mood and affect. Her behavior is normal. Judgment and thought content normal.  Nursing note and vitals reviewed.    ED Treatments / Results  Labs (all labs ordered are listed, but only abnormal results are displayed) Labs Reviewed  URINALYSIS, ROUTINE W REFLEX MICROSCOPIC (NOT AT [facility])  PREGNANCY, URINE  CBC    EKG  EKG Interpretation None       Radiology Ct Head Wo Contrast  Result Date: 01/16/2016 CLINICAL DATA:  Headaches for several days EXAM: CT HEAD WITHOUT CONTRAST TECHNIQUE: Contiguous axial images were obtained from the base of the skull through the vertex without intravenous contrast. COMPARISON:  None. FINDINGS: Brain: No evidence of acute infarction, hemorrhage, hydrocephalus, extra-axial collection or mass lesion/mass effect. Vascular: No hyperdense vessel or unexpected calcification. Skull: Normal. Negative for fracture or focal lesion. Sinuses/Orbits: No acute finding. Other: None. IMPRESSION: No acute intracranial abnormality noted. Electronically Signed   By: Alcide Clever M.D.   On: 01/16/2016 15:27    Procedures Procedures (including critical care time)  Medications Ordered in ED Medications  0.9 %  sodium chloride infusion (1,000 mLs Intravenous New Bag/Given 01/16/16 1402)    Followed by  0.9 %  sodium chloride infusion (not administered)  metoCLOPramide (REGLAN) injection 10 mg (10 mg Intravenous Given 01/16/16 1402)  diphenhydrAMINE (BENADRYL) injection 25 mg (25 mg Intravenous Given 01/16/16 1404)     Initial Impression / Assessment and Plan / ED Course  I have reviewed the  triage vital signs and the nursing notes.  Pertinent labs & imaging results that were available during my care of the patient were reviewed by me and considered in my medical decision making (see chart for details).  Clinical Course    Pt is feeling much better.  She knows to return if worse and to f/u with neurologist.  Final Clinical Impressions(s) / ED Diagnoses   Final diagnoses:  Acute nonintractable headache, unspecified headache type    New Prescriptions New Prescriptions   KETOROLAC (TORADOL) 10 MG TABLET    Take 1 tablet (10 mg total) by mouth every 6 (six) hours as needed.     Jacalyn Lefevre, MD 01/16/16 [phone]

## 2016-01-16 NOTE — ED Triage Notes (Signed)
Pt reports migraine headache x2-3 days ago, denies n/v. Pt was diagnosed with MS in 2016 and thinks it may be related.  Pt denies slurred speech, unilateral weakness, no droop noted.  Pt alert and oriented.

## 2016-04-19 ENCOUNTER — Telehealth: Payer: Self-pay | Admitting: Orthopaedic Surgery

## 2016-04-19 ENCOUNTER — Emergency Department (HOSPITAL_COMMUNITY): Payer: Medicaid Other

## 2016-04-19 ENCOUNTER — Emergency Department (HOSPITAL_COMMUNITY)
Admission: EM | Admit: 2016-04-19 | Discharge: 2016-04-19 | Disposition: A | Discharge status: Home / Self Care | Payer: Medicaid Other | Attending: Emergency Medicine | Admitting: Emergency Medicine

## 2016-04-19 ENCOUNTER — Encounter (HOSPITAL_COMMUNITY): Payer: Self-pay | Admitting: *Deleted

## 2016-04-19 DIAGNOSIS — Y999 Unspecified external cause status: Secondary | ICD-10-CM | POA: Diagnosis not present

## 2016-04-19 DIAGNOSIS — S6991XA Unspecified injury of right wrist, hand and finger(s), initial encounter: Secondary | ICD-10-CM | POA: Diagnosis present

## 2016-04-19 DIAGNOSIS — W1839XA Other fall on same level, initial encounter: Secondary | ICD-10-CM | POA: Insufficient documentation

## 2016-04-19 DIAGNOSIS — Y939 Activity, unspecified: Secondary | ICD-10-CM | POA: Diagnosis not present

## 2016-04-19 DIAGNOSIS — Y929 Unspecified place or not applicable: Secondary | ICD-10-CM | POA: Diagnosis not present

## 2016-04-19 DIAGNOSIS — F17200 Nicotine dependence, unspecified, uncomplicated: Secondary | ICD-10-CM | POA: Diagnosis not present

## 2016-04-19 DIAGNOSIS — M25531 Pain in right wrist: Secondary | ICD-10-CM | POA: Insufficient documentation

## 2016-04-19 MED ORDER — NAPROXEN 250 MG PO TABS: 250.0000 mg | ORAL_TABLET | Freq: Two times a day (BID) | ORAL | 0 refills | Status: DC | PRN

## 2016-04-19 NOTE — Telephone Encounter (Signed)
Patient called stating she went to ED.  She said she fell last week.  She said there was not a fracture showing on the xray.  She wanted to make an appointment in our office.  I told her that her insurance requires a referral and that she would need to get with her PCP to have a referral sent to our office.  She said she would take care of this.

## 2016-04-19 NOTE — Discharge Instructions (Signed)
Take the prescription as directed.  Apply moist heat or ice to the area(s) of discomfort, for 15 minutes at a time, several times per day for the next few days.  Do not fall asleep on a heating or ice pack. Wear the wrist splint until you are seen in follow up. Call the Orthopedic doctor today to schedule a follow up appointment within one week.  Return to the Emergency Department immediately if worsening.

## 2016-04-19 NOTE — ED Provider Notes (Signed)
[facility] Provider Note   CSN: [id number] Arrival date & time: 04/19/16  4098     History   Chief Complaint Chief Complaint  Patient presents with  . Wrist Injury    HPI Brooke [name] is a 31 y.o. female.  HPI  Pt was seen at 0755. Per pt, c/o gradual onset and persistence of constant right wrist "pain" for the past 1 week. Pt states she "falls a lot because I have MS." States she fell onto outstretched hands approximately 1 week ago. Pt c/o continued pain right wrist since the fall. Pt is right handed. Pt denies new injury, no fevers, no open wounds, no rash, no deformity, no focal motor weakness, no tingling/numbness in extremities. Denies any other injuries.    Past Medical History:  Diagnosis Date  . Anxiety   . Chronic back pain   . Edema   . MS (multiple sclerosis) (HCC) 09/29/2014    Patient Active Problem List   Diagnosis Date Noted  . Acute appendicitis 05/12/2015  . MS (multiple sclerosis) (HCC) 09/29/2014  . Positive ANA (antinuclear antibody) 07/02/2014  . Abnormal MRI of head 04/22/2014  . Transient diplopia 04/22/2014  . 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 Spell of memory loss 04/22/2014  . Recurrent falls while walking 04/22/2014    Past Surgical History:  Procedure Laterality Date  . APPENDECTOMY    . CHOLECYSTECTOMY N/A 10/07/2013   Procedure: LAPAROSCOPIC CHOLECYSTECTOMY;  Surgeon: Dalia Heading, MD;  Location: [facility];  Service: General;  Laterality: N/A;  . LAPAROSCOPIC APPENDECTOMY N/A 05/12/2015   Procedure: APPENDECTOMY LAPAROSCOPIC;  Surgeon: Franky Macho, MD;  Location: [facility];  Service: General;  Laterality: N/A;  . WISDOM TOOTH EXTRACTION      OB History    Gravida Para Term Preterm AB Living   2       2 0   SAB TAB Ectopic Multiple Live Births   1   1           Home Medications    Prior to Admission medications   Medication Sig Start Date End Date Taking? Authorizing Provider  Dimethyl Fumarate 240 MG CPDR Take 1 capsule (240 mg  total) by mouth 2 (two) times daily. 10/15/15   Porfirio Mylar Dohmeier, MD  ferrous sulfate 325 (65 FE) MG tablet Take 325 mg by mouth every morning.    Historical Provider, MD  gabapentin (NEURONTIN) 300 MG capsule Take 1 capsule (300 mg total) by mouth 2 (two) times daily. Take one after lunch, and one at bedtime 01/07/16   Melvyn Novas, MD  ibuprofen (ADVIL,MOTRIN) 800 MG tablet Take 1 tablet (800 mg total) by mouth 3 (three) times daily. 10/11/15   Burgess Amor, PA-C  ketorolac (TORADOL) 10 MG tablet Take 1 tablet (10 mg total) by mouth every 6 (six) hours as needed. 01/16/16   Jacalyn Lefevre, MD  Multiple Vitamin (MULTIVITAMIN) tablet Take 1 tablet by mouth daily.    Historical Provider, MD    Family History Family History  Problem Relation Age of Onset  . Diabetes Mother   . Colon polyps Mother     hx of cancer  . Other Mother     DDD Lumber, cervical  . Sleep apnea Father     Social History Social History  Substance Use Topics  . Smoking status: Current Every Day Smoker    Last attempt to quit: 09/16/2007  . Smokeless tobacco: Never Used  . Alcohol use No     Allergies  Lexapro [escitalopram oxalate] and Latex   Review of Systems Review of Systems ROS: Statement: All systems negative except as marked or noted in the HPI; Constitutional: Negative for fever and chills. ; ; Eyes: Negative for eye pain, redness and discharge. ; ; ENMT: Negative for ear pain, hoarseness, nasal congestion, sinus pressure and sore throat. ; ; Cardiovascular: Negative for chest pain, palpitations, diaphoresis, dyspnea and peripheral edema. ; ; Respiratory: Negative for cough, wheezing and stridor. ; ; Gastrointestinal: Negative for nausea, vomiting, diarrhea, abdominal pain, blood in stool, hematemesis, jaundice and rectal bleeding. . ; ; Genitourinary: Negative for dysuria, flank pain and hematuria. ; ; Musculoskeletal: +right wrist pain. Negative for back pain and neck pain. Negative for swelling and  deformity.; ; Skin: Negative for pruritus, rash, abrasions, blisters, bruising and skin lesion.; ; Neuro: Negative for headache, lightheadedness and neck stiffness. Negative for weakness, altered level of consciousness, altered mental status, extremity weakness, paresthesias, involuntary movement, seizure and syncope.       Physical Exam Updated Vital Signs BP 134/59 (BP Location: Left Arm)   Pulse 76   Temp 98.3 F (36.8 C) (Oral)   Resp 18   Ht 5\' 4"  (1.626 m)   Wt (!) 308 lb (139.7 kg)   LMP 03/26/2016   SpO2 100%   BMI 52.87 kg/m   Physical Exam 0800: Physical examination:  Nursing notes reviewed; Vital signs and O2 SAT reviewed;  Constitutional: Well developed, Well nourished, Well hydrated, In no acute distress; Head:  Normocephalic, atraumatic; Eyes: EOMI, PERRL, No scleral icterus; ENMT: Mouth and pharynx normal, Mucous membranes moist; Neck: Supple, Full range of motion, No lymphadenopathy; Cardiovascular: Regular rate and rhythm, No gallop; Respiratory: Breath sounds clear & equal bilaterally, No wheezes.  Speaking full sentences with ease, Normal respiratory effort/excursion; Chest: Nontender, Movement normal; Abdomen: Soft, Nontender, Nondistended, Normal bowel sounds; Genitourinary: No CVA tenderness; Extremities: Pulses normal, NT right shoulder/elbow/hand/fingers. +right snuuffbox tenderness.  +pain to axial thumb load. No TTP over dorsal mid-carpal bones or to 3rd MCP loading.  Right forearm compartments soft, strong radial pp, brisk cap refill in fingers. Right hand NMS intact.  Decreased ROM F/E right wrist d/t c/o pain. Pt using her handheld electronic device on my arrival to exam room. No deformity. No ecchymosis, no erythema. No edema..; Neuro: AA&Ox3, Major CN grossly intact.  Speech clear. No gross focal motor or sensory deficits in extremities.; Skin: Color normal, Warm, Dry.   ED Treatments / Results  Labs (all labs ordered are listed, but only abnormal results are  displayed)   EKG  EKG Interpretation None       Radiology   Procedures Procedures (including critical care time)  Medications Ordered in ED Medications - No data to display   Initial Impression / Assessment and Plan / ED Course  I have reviewed the triage vital signs and the nursing notes.  Pertinent labs & imaging results that were available during my care of the patient were reviewed by me and considered in my medical decision making (see chart for details).  MDM Reviewed: previous chart, nursing note and vitals Interpretation: x-ray   Dg Wrist Complete Right Result Date: 04/19/2016 CLINICAL DATA:  Pain.  Injury. EXAM: RIGHT WRIST - COMPLETE 3+ VIEW COMPARISON:  No recent . FINDINGS: No acute bony or joint abnormality. No evidence of fracture or dislocation. IMPRESSION: No acute abnormality. Electronically Signed   By: Maisie Fus  Register   On: 04/19/2016 07:52    0800:  XR reassuring. Given snuffbox  tenderness, will place in splint, f/u Ortho MD. Pt agreeable with plan. Dx and testing d/w pt.  Questions answered.  Verb understanding, agreeable to d/c home with outpt f/u.   Final Clinical Impressions(s) / ED Diagnoses   Final diagnoses:  None    New Prescriptions New Prescriptions   No medications on file     Samuel Jester, DO 04/24/16 1722

## 2016-04-19 NOTE — ED Triage Notes (Signed)
Last week pt fell. She braced herself with her hands. Since then she has been having right wrist pain. NAD noted.

## 2016-04-26 ENCOUNTER — Encounter: Payer: Self-pay | Admitting: Neurology

## 2016-04-26 ENCOUNTER — Ambulatory Visit: Payer: Medicaid Other | Admitting: Neurology

## 2016-04-26 VITALS — BP 131/72 | HR 70 | Resp 20 | Ht 64.0 in | Wt 321.0 lb

## 2016-04-26 DIAGNOSIS — G35 Multiple sclerosis: Secondary | ICD-10-CM

## 2016-04-26 DIAGNOSIS — F410 Panic disorder [episodic paroxysmal anxiety] without agoraphobia: Secondary | ICD-10-CM | POA: Diagnosis not present

## 2016-04-26 MED ORDER — ALPRAZOLAM 1 MG PO TABS: 1.0000 mg | ORAL_TABLET | Freq: Every evening | ORAL | 0 refills | Status: DC | PRN

## 2016-04-26 NOTE — Patient Instructions (Signed)
Ocrelizumab injection What is this medicine? OCRELIZUMAB (ok re LIZ ue mab) treats multiple sclerosis. It helps to decrease the number of multiple sclerosis relapses. It is not a cure. This medicine may be used for other purposes; ask your health care provider or pharmacist if you have questions. COMMON BRAND NAME(S): OCREVUS What should I tell my health care provider before I take this medicine? They need to know if you have any of these conditions: -cancer -hepatitis B infection -other infection (especially a virus infection such as chickenpox, cold sores, or herpes) -an unusual or allergic reaction to ocrelizumab, other medicines, foods, dyes or preservatives -pregnant or trying to get pregnant -breast-feeding How should I use this medicine? This medicine is for infusion into a vein. It is given by a health care professional in a hospital or clinic setting. Talk to your pediatrician regarding the use of this medicine in children. Special care may be needed. Overdosage: If you think you have taken too much of this medicine contact a poison control center or emergency room at once. NOTE: This medicine is only for you. Do not share this medicine with others. What if I miss a dose? Keep appointments for follow-up doses as directed. It is important not to miss your dose. Call your doctor or health care professional if you are unable to keep an appointment. What may interact with this medicine? -alemtuzumab -daclizumab -dimethyl fumarate -fingolimod -glatiramer -interferon beta -live virus vaccines -mitoxantrone -natalizumab -peginterferon beta -rituximab -steroid medicines like prednisone or cortisone -teriflunomide This list may not describe all possible interactions. Give your health care provider a list of all the medicines, herbs, non-prescription drugs, or dietary supplements you use. Also tell them if you smoke, drink alcohol, or use illegal drugs. Some items may interact with  your medicine. What should I watch for while using this medicine? Tell your doctor or healthcare professional if your symptoms do not start to get better or if they get worse. This medicine can cause serious allergic reactions. To reduce your risk you may need to take medicine before treatment with this medicine. Take your medicine as directed. Women should inform their doctor if they wish to become pregnant or think they might be pregnant. There is a potential for serious side effects to an unborn child. Talk to your health care professional or pharmacist for more information. Female patients should use effective birth control methods while receiving this medicine and for 6 months after the last dose. Call your doctor or health care professional for advice if you get a fever, chills or sore throat, or other symptoms of a cold or flu. Do not treat yourself. This drug decreases your body's ability to fight infections. Try to avoid being around people who are sick. If you have a hepatitis B infection or a history of a hepatitis B infection, talk to your doctor. The symptoms of hepatitis B may get worse if you take this medicine. In some patients, this medicine may cause a serious brain infection that may cause death. If you have any problems seeing, thinking, speaking, walking, or standing, tell your doctor right away. If you cannot reach your doctor, urgently seek other source of medical care. This medicine can decrease the response to a vaccine. If you need to get vaccinated, tell your healthcare professional if you have received this medicine. Extra booster doses may be needed. Talk to your doctor to see if a different vaccination schedule is needed. Talk to your doctor about your risk of cancer.   You may be more at risk for certain types of cancers if you take this medicine. What side effects may I notice from receiving this medicine? Side effects that you should report to your doctor or health care  professional as soon as possible: -allergic reactions like skin rash, itching or hives, swelling of the face, lips, or tongue -breathing problems -facial flushing -fast, irregular heartbeat -lump or soreness in the breast -signs and symptoms of herpes such as cold sore, shingles, or genital sores -signs and symptoms of infection like fever or chills, cough, sore throat, pain or trouble passing urine -signs and symptoms of low blood pressure like dizziness; feeling faint or lightheaded, falls; unusually weak or tired -signs and symptoms of progressive multifocal leukoencephalopathy (PML) like changes in vision; clumsiness; confusion; personality changes; weakness on one side of the body -swelling of the ankles, feet, hands Side effects that usually do not require medical attention (report these to your doctor or health care professional if they continue or are bothersome): -back pain -depressed mood -diarrhea -pain, redness, or irritation at site where injected This list may not describe all possible side effects. Call your doctor for medical advice about side effects. You may report side effects to FDA at 1-800-FDA-1088. Where should I keep my medicine? This drug is given in a hospital or clinic and will not be stored at home. NOTE: This sheet is a summary. It may not cover all possible information. If you have questions about this medicine, talk to your doctor, pharmacist, or health care provider.  2018 Elsevier/Gold Standard (2015-05-26 09:40:25)  

## 2016-04-26 NOTE — Progress Notes (Signed)
PATIENT: Brooke [name] DOB: Mar 12, 1985  REASON FOR VISIT: follow up- multiple sclerosis HISTORY FROM: patient  HISTORY OF PRESENT ILLNESS:  HISTORY (Brooke [name]): Miss Brooke [name] is a 31 year old female, and recently widowed  with a history of multiple sclerosis. She returns 04-01-15 for follow-up. She is currently on Tecfidera and tolerating it well although she states that she feels that it is "not working." She states that she continues to have the episodes that she had before starting tecfidera. These episodes include slurred speech (at times), jitteriness and dragging the right leg. Some times she shakes in either leg. These episodes do not happen daily but rather intermittently. She confessed to missing oral doses. We discussed transfer to [facility]. Brooke. [name]'s family also reports that she indeed has fallen several times in the last 6 months, luckily without any injuries. We are meeting today to discuss her recent lab results and also the MRI from 01/21/2015. This was compared to an MRI from January of the same year; there was a new focus seen in the left anterior internal capsule that was not present on the January films, but there has been a resolution of another focus.  The MRI is consistent with multiple sclerosis. Her labs were also reviewed and were entirely normal. Her CBC with differential was entirely normal limits dated and resulted on 12/31/2014.  NP ;Brooke. Brooke [name] is a 31 year old female with a history of multiple sclerosis. She returns today for follow-up. She remains on Tecfidera and is tolerating it well. At her last visit with Dr. Vickey Huger they were considering  switching to Tysabri however the patient is blood work revealed a positive JCV so the patient was advised to remain on Tecfidera or try OCREVUS .  The patient does not report any new symptoms, however, she does still complain of subjective weakness in both lower extremities.  She states that she will drag the left leg  when ambulating for a longer distance . She reports that she has not had any additional falls- but she does occasionally stumble.  She does feel that her balance is off and her family member agrees with this.  She also reports blurry vision. She denies any changes with the bowels or bladder although she does feel that she does have some urgency.  The patient states that she has noticed a tremor primarily in the left arm, and it is not always present. . She states that she has noticed this when she is holding a cup. She also at times will feel shaky on the inside. She does report some anxiety and possible depression.  She states that she also has chronic back pain she reports that her primary care would not start her on any pain medication. She is requesting to be sent to a pain specialist ( deferred to PCP ) .  She also reports that her primary care would not place her on any anxiety medication because he did not want her to become addicted to this medication. I explained that SSRI medication is not addictive.  She returns today for an evaluation, brought crutches.   04-26-2016, Brooke [name] is seen for a 6 month interval visit. She received a phone call in autumn of last year stating that she has tested negative for hepatitis and for tuberculosis and that she could be a candidate for reverse. We had eliminated Tysabri from her list as possible medication due to a positive JC virus panel. Brooke [name] them had briefly dropped out of Medicaid, and could  not make a new appointment in our office for the last 3 month until the issue was settled. Due to this problem with insurance coverage she was unable to obtain an MRI of the brain and C-spine is recommended for February of this year and I will have to reorder it today.  The patient also experienced another fall and strained her right breast. She is currently in a brace has not seen an orthopedic surgeon yet, and x-ray has been taken. She was seen at the  emergency room. I would like for Brooke [name] to consider changing from Tysabri to Ocrevus. She would need transportation.     REVIEW OF SYSTEMS: Out of a complete 14 system review of symptoms, the patient complains only of the following symptoms, and all other reviewed systems are negative.  Fatigue, blurred vision, incontinence of bowel, restless leg, incontinence of bladder, back pain, aching muscles, muscle cramps, walking difficulty, heat intolerance, anemia, memory loss, headache, facial drooping, off balance a lot.  I can't remeber a lot of things "    ALLERGIES: Allergies  Allergen Reactions  . Lexapro [Escitalopram Oxalate] Other (See Comments)    Suicidal thoughts, lowered libido  . Latex Hives    HOME MEDICATIONS: Outpatient Medications Prior to Visit  Medication Sig Dispense Refill  . Dimethyl Fumarate 240 MG CPDR Take 1 capsule (240 mg total) by mouth 2 (two) times daily. 60 capsule 5  . ferrous sulfate 325 (65 FE) MG tablet Take 325 mg by mouth every morning.    . gabapentin (NEURONTIN) 300 MG capsule Take 1 capsule (300 mg total) by mouth 2 (two) times daily. Take one after lunch, and one at bedtime 60 capsule 5  . ibuprofen (ADVIL,MOTRIN) 800 MG tablet Take 1 tablet (800 mg total) by mouth 3 (three) times daily. 21 tablet 0  . ketorolac (TORADOL) 10 MG tablet Take 1 tablet (10 mg total) by mouth every 6 (six) hours as needed. 10 tablet 0  . Multiple Vitamin (MULTIVITAMIN) tablet Take 1 tablet by mouth daily.    . naproxen (NAPROSYN) 250 MG tablet Take 1 tablet (250 mg total) by mouth 2 (two) times daily as needed for mild pain or moderate pain (take with food). 14 tablet 0   No facility-administered medications prior to visit.     PAST MEDICAL HISTORY: Past Medical History:  Diagnosis Date  . Anxiety   . Chronic back pain   . Edema   . Brooke (multiple sclerosis) (HCC) 09/29/2014    PAST SURGICAL HISTORY: Past Surgical History:  Procedure Laterality Date  .  APPENDECTOMY    . CHOLECYSTECTOMY N/A 10/07/2013   Procedure: LAPAROSCOPIC CHOLECYSTECTOMY;  Surgeon: Dalia Heading, MD;  Location: [facility];  Service: General;  Laterality: N/A;  . LAPAROSCOPIC APPENDECTOMY N/A 05/12/2015   Procedure: APPENDECTOMY LAPAROSCOPIC;  Surgeon: Franky Macho, MD;  Location: [facility];  Service: General;  Laterality: N/A;  . WISDOM TOOTH EXTRACTION      FAMILY HISTORY: Family History  Problem Relation Age of Onset  . Diabetes Mother   . Colon polyps Mother     hx of cancer  . Other Mother     DDD Lumber, cervical  . Sleep apnea Father     SOCIAL HISTORY: Social History   Social History  . Marital status: Single    Spouse name: N/A  . Number of children: N/A  . Years of education: 2 y colleg   Occupational History  . work    Social History Main  Topics  . Smoking status: Current Every Day Smoker    Last attempt to quit: 09/16/2007  . Smokeless tobacco: Never Used  . Alcohol use No  . Drug use: No     Comment: no drug use since 2010  . Sexual activity: No   Other Topics Concern  . Not on file   Social History Narrative   Drinks 1-2 large glasses of caffeine daily.            PHYSICAL EXAM  Vitals:   04/26/16 1104  BP: 131/72  Pulse: 70  Resp: 20  Weight: (!) 321 lb (145.6 kg)  Height: 5\' 4"  (1.626 m)   Body mass index is 55.1 kg/m.  Generalized: Well developed, in no acute distress, obese  Neurological examination  Mentation: Alert oriented to time, place, history taking. Follows all commands speech and language fluent Cranial nerve- Patient reports no change in taste or smell,  Pupils were equal round reactive to light. Extraocular movements were full, visual field were full on confrontational test. Facial sensation and strength were normal. Uvula tongue midline. Head turning and shoulder shrug  were normal and symmetric. Tongue protruding in either cheek is intact Motor:full strength ,symmetric motor tone . Sensory: intact to  soft touch on all 4 extremities. Coordination:  finger-nose-finger intact.  Gait and station: Gait is normal. Tandem gait is slightly unsteady. Romberg is negative. No drift is seen.  Reflexes: Deep tendon reflexes are symmetric in the upper extremities bilaterally, I noticed her left patella reflex is much more brisk than her right. She presents with upgoing toes bilaterally.[name]   DIAGNOSTIC DATA (LABS, IMAGING, TESTING) - I reviewed patient records, labs, notes, testing and imaging myself where available.  Lab Results  Component Value Date   WBC 4.8 01/16/2016   HGB 13.1 01/16/2016   HCT 38.2 01/16/2016   MCV 87.8 01/16/2016   PLT 247 01/16/2016      Component Value Date/Time   NA 143 10/15/2015 1114   K 4.5 10/15/2015 1114   CL 103 10/15/2015 1114   CO2 26 10/15/2015 1114   GLUCOSE 89 10/15/2015 1114   GLUCOSE 123 (H) 05/12/2015 1651   BUN 7 10/15/2015 1114   CREATININE 0.70 10/15/2015 1114   CALCIUM 9.0 10/15/2015 1114   PROT 6.9 10/15/2015 1114   ALBUMIN 3.7 10/15/2015 1114   AST 11 10/15/2015 1114   ALT 11 10/15/2015 1114   ALKPHOS 69 10/15/2015 1114   BILITOT 0.2 10/15/2015 1114   GFRNONAA 119 10/15/2015 1114   GFRAA 137 10/15/2015 1114      ASSESSMENT AND PLAN 31 y.o. year old female  has a past medical history of Anxiety; Chronic back pain; Edema; and Brooke (multiple sclerosis) (HCC) (09/29/2014). here with:  1. Multiple sclerosis- the patient presented with positive oligoclonal bands and an MRI of the brain that was highly suggestive of demyelinating lesions at the time of her diagnosis. We have to repeat an MRI now she has been on Taxotere a 4 year. Again I will consider changing her to Ocrevus.  2. Anxiety 3.  "Legs buckle ", gait stability decreased. I strongly suspect that her gait instability is related to Brooke. 4.   The patient's physical exam is unchanged , except for the right wrist and arm brace.  I did not note any weakness or dragging of the left foot on  exam. The patient will remain on Tecfidera for now. I will check blood work today. Still consider changing to OCREVUS should new  lesions be seen.  She should follow-up with her primary care provider in regards to chronic back pain.  Also advised patient that she could consult with a therapist or psychiatrist in regards to her ongoing anxiety and depression.  She will follow-up with our office in 6 months with NP.  I spent 25 minutes with the patient. 50% of this time was spent counseling the patient on her treatment options. MRI repeat brain and cervical spine.   RV in 6 month with NP>     Legrande Hao, MD  04/26/2016, 11:07 AM [facility] [address] [place], [place] [address] [phone]

## 2016-04-27 LAB — CBC WITH DIFFERENTIAL/PLATELET
Basophils Absolute: 0 10*3/uL (ref 0.0–0.2)
Basos: 0 %
EOS (ABSOLUTE): 0.1 10*3/uL (ref 0.0–0.4)
Eos: 2 %
Hematocrit: 34 % (ref 34.0–46.6)
Hemoglobin: 12 g/dL (ref 11.1–15.9)
Immature Grans (Abs): 0 10*3/uL (ref 0.0–0.1)
Immature Granulocytes: 0 %
Lymphocytes Absolute: 1 10*3/uL (ref 0.7–3.1)
Lymphs: 29 %
MCH: 29.9 pg (ref 26.6–33.0)
MCHC: 35.3 g/dL (ref 31.5–35.7)
MCV: 85 fL (ref 79–97)
Monocytes Absolute: 0.3 10*3/uL (ref 0.1–0.9)
Monocytes: 8 %
Neutrophils Absolute: 2.2 10*3/uL (ref 1.4–7.0)
Neutrophils: 61 %
Platelets: 253 10*3/uL (ref 150–379)
RBC: 4.01 x10E6/uL (ref 3.77–5.28)
RDW: 13.2 % (ref 12.3–15.4)
WBC: 3.6 10*3/uL (ref 3.4–10.8)

## 2016-04-27 LAB — COMPREHENSIVE METABOLIC PANEL
ALBUMIN: 3.8 g/dL (ref 3.5–5.5)
ALK PHOS: 61 IU/L (ref 39–117)
ALT: 16 IU/L (ref 0–32)
AST: 15 IU/L (ref 0–40)
Albumin/Globulin Ratio: 1.3 (ref 1.2–2.2)
BILIRUBIN TOTAL: 0.4 mg/dL (ref 0.0–1.2)
BUN / CREAT RATIO: 12 (ref 9–23)
BUN: 9 mg/dL (ref 6–20)
CO2: 25 mmol/L (ref 18–29)
CREATININE: 0.76 mg/dL (ref 0.57–1.00)
Calcium: 8.9 mg/dL (ref 8.7–10.2)
Chloride: 98 mmol/L (ref 96–106)
GFR calc Af Amer: 123 mL/min/{1.73_m2} (ref >59)
GFR calc non Af Amer: 107 mL/min/{1.73_m2} (ref >59)
GLOBULIN, TOTAL: 3 g/dL (ref 1.5–4.5)
Glucose: 100 mg/dL — ABNORMAL HIGH (ref 65–99)
Potassium: 4.4 mmol/L (ref 3.5–5.2)
SODIUM: 138 mmol/L (ref 134–144)
TOTAL PROTEIN: 6.8 g/dL (ref 6.0–8.5)

## 2016-04-28 ENCOUNTER — Telehealth: Payer: Self-pay

## 2016-04-28 NOTE — Telephone Encounter (Signed)
-----   Message from Melvyn Novas, MD sent at 04/27/2016  5:31 PM EST ----- Normal metabolic panel. Non fast glucose is elevated. CD

## 2016-04-28 NOTE — Telephone Encounter (Signed)
I called pt and advised her that her labs were normal, except her glucose was elevated. Pt says she ate something before her appt. Pt verbalized understanding of results. Pt had no questions at this time but was encouraged to call back if questions arise.

## 2016-05-04 ENCOUNTER — Ambulatory Visit (HOSPITAL_COMMUNITY): Payer: Medicaid Other

## 2016-05-04 ENCOUNTER — Telehealth: Payer: Self-pay | Admitting: Neurology

## 2016-05-04 ENCOUNTER — Ambulatory Visit (HOSPITAL_COMMUNITY): Admission: RE | Admit: 2016-05-04 | Payer: Medicaid Other | Source: Ambulatory Visit

## 2016-05-04 NOTE — Telephone Encounter (Signed)
Got a phone call from [facility] stating that the MRI does not allow this patient's weight. She exceeds not just the weight limit but apparently the lumen of the MRI. Please arrange for a [facility] imaging appointment instead of [employer].  Sincerely c. Nicholai Willette

## 2016-05-05 NOTE — Telephone Encounter (Signed)
Thank you, Irving Burton  !  CD

## 2016-05-05 NOTE — Telephone Encounter (Signed)
She rescheduled her MRI for [facility] she is scheduled for 05/12/16 and 05/16/16.

## 2016-05-06 ENCOUNTER — Ambulatory Visit (HOSPITAL_COMMUNITY): Payer: Medicaid Other

## 2016-05-12 ENCOUNTER — Ambulatory Visit
Admission: RE | Admit: 2016-05-12 | Discharge: 2016-05-12 | Disposition: A | Discharge status: Home / Self Care | Payer: Medicaid Other | Source: Ambulatory Visit | Attending: Neurology | Admitting: Neurology

## 2016-05-12 DIAGNOSIS — G35 Multiple sclerosis: Secondary | ICD-10-CM | POA: Diagnosis not present

## 2016-05-12 MED ORDER — GADOBENATE DIMEGLUMINE 529 MG/ML IV SOLN
20.0000 mL | Freq: Once | INTRAVENOUS | Status: AC | PRN
  Administered 2016-05-12: 20 mL via INTRAVENOUS

## 2016-05-16 ENCOUNTER — Telehealth: Payer: Self-pay

## 2016-05-16 ENCOUNTER — Ambulatory Visit
Admission: RE | Admit: 2016-05-16 | Discharge: 2016-05-16 | Disposition: A | Discharge status: Home / Self Care | Payer: Medicaid Other | Source: Ambulatory Visit | Attending: Neurology | Admitting: Neurology

## 2016-05-16 DIAGNOSIS — F410 Panic disorder [episodic paroxysmal anxiety] without agoraphobia: Secondary | ICD-10-CM

## 2016-05-16 DIAGNOSIS — G35 Multiple sclerosis: Secondary | ICD-10-CM | POA: Diagnosis not present

## 2016-05-16 MED ORDER — GADOBENATE DIMEGLUMINE 529 MG/ML IV SOLN
20.0000 mL | Freq: Once | INTRAVENOUS | Status: AC | PRN
  Administered 2016-05-16: 20 mL via INTRAVENOUS

## 2016-05-16 NOTE — Telephone Encounter (Signed)
-----   Message from Melvyn Novas, MD sent at 05/13/2016  1:04 PM EDT ----- No changes in cervical sine MRI since last study. Non acute MS lesions. CD

## 2016-05-16 NOTE — Telephone Encounter (Signed)
I called pt and advised her that her MRI brain and c-spine showed non-acute MS lesions, but no changes since the previous MRI. Pt verbalized understanding of results. Pt had no questions at this time but was encouraged to call back if questions arise.

## 2016-05-16 NOTE — Telephone Encounter (Signed)
-----   Message from Melvyn Novas, MD sent at 05/13/2016 12:15 PM EDT ----- MRI findings consistent with MS, but no major changes since initial diagnosis / comparison  with MRI from 01-10-2017. CD

## 2016-05-17 ENCOUNTER — Telehealth: Payer: Self-pay

## 2016-05-17 NOTE — Telephone Encounter (Signed)
Thoracic lesions are not acute, but her symptoms do fit a relapse with locus at that region- I would, recommend Ocrevus for this patient .  She should come in and sign the information papers. CD

## 2016-05-17 NOTE — Telephone Encounter (Signed)
I called pt. I advised her that the MRI of her back showed some MS demyelination but no nerve impingement. Pt wants to know if Dr. Vickey Huger is going to change her to ocrevus now? Pt is currently taking tecfidera. I advised pt that I will discuss this with Dr. Vickey Huger.  Pt may need to come back in to sign an ocrevus start form, if so.

## 2016-05-17 NOTE — Telephone Encounter (Signed)
-----   Message from Melvyn Novas, MD sent at 05/16/2016  5:20 PM EDT ----- Small foci of MS demyelination at Th level  3-4 and Th 11-12 , no nerve impingement.

## 2016-05-18 NOTE — Telephone Encounter (Signed)
I called pt and advised her that Dr. Vickey Huger does recommend ocrevus for this pt. Pt says that she will come in tomorrow around 10am to sign the start paperwork.

## 2016-05-19 ENCOUNTER — Telehealth: Payer: Self-pay | Admitting: *Deleted

## 2016-05-19 MED ORDER — OCRELIZUMAB 300 MG/10ML IV SOLN: INTRAVENOUS | 1 refills | Status: DC

## 2016-05-19 NOTE — Telephone Encounter (Signed)
Ocrevus PA completed by phone with Medicaid (phone# [phone]).  Approved thru 05/14/17.  PA# [id number].  Approval faxed to [employer]. Pharm/fim

## 2016-05-19 NOTE — Telephone Encounter (Signed)
Pt came in and signed the ocrevus start paperwork. I did tell her that per Dr. Epimenio Foot, a 6 week washout out period is necessary for tecfidera before ocrevus. She will continue taking the tecfidera until we call her to schedule the ocrevus infusion.

## 2016-05-19 NOTE — Telephone Encounter (Signed)
Pt. is Medicaid and will need to be infused at [facility].  SRF completed and faxed to [facility].  Printed Ocrevus rx. faxed to [facility].  Pt. is still taking Tecfidera.  She needs to d/c Tecfidera 6 weeks prior to first Ocrevus infusion/fim

## 2016-05-24 ENCOUNTER — Telehealth: Payer: Self-pay | Admitting: Neurology

## 2016-05-24 NOTE — Telephone Encounter (Signed)
I called pt. She says that [facility] called her to get her scheduled for her ocrevus but that they refused to wait the 6 weeks until she stops her tecfidera. I told the pt that I will call [facility] and find out what is going on. Pt has 5 days left of tecfidera. I called [facility] but they are closed at this time. I called pt and advised her that I will call [facility] first thing tomorrow.

## 2016-05-24 NOTE — Telephone Encounter (Signed)
Pt called said she is not sure what she is suppose to do? Wants to know does she stop tecfidera if so she will need an appt at [facility]. Please call her at [phone]

## 2016-05-25 NOTE — Telephone Encounter (Signed)
I called [facility], spoke to [place], who informed me that she does not have anything on this pt. Geraldine Contras says that she would be the only one that would be calling her from [facility].   I spoke to Faith, the PA for ocrevus was approved, and she will send the orders to [facility] right now. Pt should go ahead and stop Tecfidera so she may be scheduled for her infusion ASAP.

## 2016-05-25 NOTE — Telephone Encounter (Signed)
I called Geraldine Contras again to discuss if she got the paperwork that Faith sent again this morning. Left a message asking her to call me back.

## 2016-05-25 NOTE — Telephone Encounter (Signed)
I called pt. I advised her that we have sent the paperwork to [facility], and to go ahead and stop the tecfidera so the ocrevus infusion can be scheduled as soon as the 6 weeks washout is completed. I advised her that either [facility] or [facility] will be in touch with her to schedule her ocrevus infusion. Pt verbalized understanding.

## 2016-05-26 NOTE — Telephone Encounter (Signed)
Agree with wash out beginning now. CD

## 2016-06-13 ENCOUNTER — Telehealth: Payer: Self-pay | Admitting: Neurology

## 2016-06-13 NOTE — Telephone Encounter (Signed)
Pt said she rec'd a call reg getting Hept B shot prior to OCREVUS infusion. Please call to verify where she would get it.

## 2016-06-13 NOTE — Telephone Encounter (Signed)
Kim/[facility] [phone] request RX for tecfidera. She said the pt called on 05/24/16 inquiring about status of new RX. Please call

## 2016-06-14 ENCOUNTER — Telehealth: Payer: Self-pay | Admitting: *Deleted

## 2016-06-14 NOTE — Telephone Encounter (Signed)
After speaking with staff I advised patient that we are not aware that she needs the Hep B vaccine before starting new medication. Patient voiced understanding and stated that she will talk to the people with Ocrevus about if she needs this or not.

## 2016-06-14 NOTE — Telephone Encounter (Signed)
Spoke with [place] at [facility], to f/u on orders faxed 05-26-16.  She has them and will look for appt./fim

## 2016-06-14 NOTE — Telephone Encounter (Signed)
I spoke to patient and she is aware not to take Tecfidera and I advised her that Faith spoke with [place] at [facility] and they should be scheduling her infusion soon. Patient will call back if she has not heard anything

## 2016-06-16 NOTE — Telephone Encounter (Signed)
I have spoken with Brooke [name] this afternoon, and explained that Medicaid will not communicate with 3rd parties (which Mendel Ryder is), so we are unable to get a sob from them.  He will speak with [facility] and pt. can can be scheduled for Ocrevus infusioins/fim

## 2016-06-16 NOTE — Telephone Encounter (Signed)
I have spoken with Geraldine Contras at [facility] short stay.  She sts. that per Tina/Randy in the [facility], they can't schedule pt. for Ocrevus infusions until they see the ins. sob that [place] obtains.  Medicaid does not communicate with 3rd parties, so there is no sob.  However, I completed a PA for Ocrevus and it has been approved. LMOM (identified vm) for Harvie Heck to call back so we can see if we can get pt. scheduled/fim

## 2016-06-16 NOTE — Telephone Encounter (Signed)
Dee/[facility] called to speak with RN

## 2016-06-27 MED FILL — OCREVUS 300 MG/10ML SOLN: 300 | 15 days supply | Qty: 20 | Fill #0

## 2016-06-29 ENCOUNTER — Encounter: Payer: Medicaid Other | Admitting: Obstetrics & Gynecology

## 2016-06-30 NOTE — Telephone Encounter (Signed)
Pt called said she was advised by Sherri Sear today she had an infusion scheduled and advised her she should contact clinic to let her know where to go for infusion. Pt is confused and agitated. Please call to advise

## 2016-06-30 NOTE — Telephone Encounter (Signed)
I had not received appt. times for pt's Ocrelizumab infusions yet.  I spoke with Geraldine Contras at [facility] short stay and was advised pt. has appt's on 07/05/16 and 07/19/16 at 0730am, and on 01/06/17 at 0730.  I have spoken with Alyn and advised her of appt. times, given her [facility] short stay's address ([address], [place]), and phone# [phone]) in case she needs to call for directions.  She verbalized understanding of same/fim

## 2016-07-05 ENCOUNTER — Encounter (HOSPITAL_COMMUNITY)
Admission: RE | Admit: 2016-07-05 | Discharge: 2016-07-05 | Disposition: A | Discharge status: Home / Self Care | Payer: Medicaid Other | Source: Ambulatory Visit | Attending: Neurology | Admitting: Neurology

## 2016-07-05 ENCOUNTER — Telehealth: Payer: Self-pay

## 2016-07-05 ENCOUNTER — Encounter (HOSPITAL_COMMUNITY): Payer: Self-pay

## 2016-07-05 DIAGNOSIS — G35 Multiple sclerosis: Secondary | ICD-10-CM | POA: Diagnosis not present

## 2016-07-05 MED ORDER — DIPHENHYDRAMINE HCL 25 MG PO CAPS
50.0000 mg | ORAL_CAPSULE | Freq: Once | ORAL | Status: AC
  Administered 2016-07-05: 50 mg via ORAL
  Filled 2016-07-05: qty 2

## 2016-07-05 MED ORDER — ACETAMINOPHEN 325 MG PO TABS
650.0000 mg | ORAL_TABLET | ORAL | Status: DC
  Administered 2016-07-05: 650 mg via ORAL
  Filled 2016-07-05: qty 2

## 2016-07-05 MED ORDER — DIPHENHYDRAMINE HCL 50 MG/ML IJ SOLN
50.0000 mg | INTRAMUSCULAR | Status: DC
  Filled 2016-07-05: qty 1

## 2016-07-05 MED ORDER — SODIUM CHLORIDE 0.9 % IV SOLN
INTRAVENOUS | Status: DC
  Administered 2016-07-05: 08:00:00 via INTRAVENOUS

## 2016-07-05 MED ORDER — SODIUM CHLORIDE 0.9 % IV SOLN
300.0000 mg | INTRAVENOUS | Status: DC
  Administered 2016-07-05: 300 mg via INTRAVENOUS
  Filled 2016-07-05: qty 10

## 2016-07-05 MED ORDER — METHYLPREDNISOLONE SODIUM SUCC 1000 MG IJ SOLR
500.0000 mg | INTRAMUSCULAR | Status: DC
  Administered 2016-07-05: 500 mg via INTRAVENOUS
  Filled 2016-07-05: qty 4

## 2016-07-05 NOTE — Discharge Instructions (Signed)
Ocrelizumab injection / infusion What is this medicine? OCRELIZUMAB (ok re LIZ ue mab) treats multiple sclerosis. It helps to decrease the number of multiple sclerosis relapses. It is not a cure. This medicine may be used for other purposes; ask your health care provider or pharmacist if you have questions. COMMON BRAND NAME(S): OCREVUS What should I tell my health care provider before I take this medicine? They need to know if you have any of these conditions: -cancer -hepatitis B infection -other infection (especially a virus infection such as chickenpox, cold sores, or herpes) -an unusual or allergic reaction to ocrelizumab, other medicines, foods, dyes or preservatives -pregnant or trying to get pregnant -breast-feeding How should I use this medicine? This medicine is for infusion into a vein. It is given by a health care professional in a hospital or clinic setting. Talk to your pediatrician regarding the use of this medicine in children. Special care may be needed. Overdosage: If you think you have taken too much of this medicine contact a poison control center or emergency room at once. NOTE: This medicine is only for you. Do not share this medicine with others. What if I miss a dose? Keep appointments for follow-up doses as directed. It is important not to miss your dose. Call your doctor or health care professional if you are unable to keep an appointment. What may interact with this medicine? -alemtuzumab -daclizumab -dimethyl fumarate -fingolimod -glatiramer -interferon beta -live virus vaccines -mitoxantrone -natalizumab -peginterferon beta -rituximab -steroid medicines like prednisone or cortisone -teriflunomide This list may not describe all possible interactions. Give your health care provider a list of all the medicines, herbs, non-prescription drugs, or dietary supplements you use. Also tell them if you smoke, drink alcohol, or use illegal drugs. Some items may  interact with your medicine. What should I watch for while using this medicine? Tell your doctor or healthcare professional if your symptoms do not start to get better or if they get worse. This medicine can cause serious allergic reactions. To reduce your risk you may need to take medicine before treatment with this medicine. Take your medicine as directed. Women should inform their doctor if they wish to become pregnant or think they might be pregnant. There is a potential for serious side effects to an unborn child. Talk to your health care professional or pharmacist for more information. Female patients should use effective birth control methods while receiving this medicine and for 6 months after the last dose. Call your doctor or health care professional for advice if you get a fever, chills or sore throat, or other symptoms of a cold or flu. Do not treat yourself. This drug decreases your body's ability to fight infections. Try to avoid being around people who are sick. If you have a hepatitis B infection or a history of a hepatitis B infection, talk to your doctor. The symptoms of hepatitis B may get worse if you take this medicine. In some patients, this medicine may cause a serious brain infection that may cause death. If you have any problems seeing, thinking, speaking, walking, or standing, tell your doctor right away. If you cannot reach your doctor, urgently seek other source of medical care. This medicine can decrease the response to a vaccine. If you need to get vaccinated, tell your healthcare professional if you have received this medicine. Extra booster doses may be needed. Talk to your doctor to see if a different vaccination schedule is needed. Talk to your doctor about your risk  of cancer. You may be more at risk for certain types of cancers if you take this medicine. What side effects may I notice from receiving this medicine? Side effects that you should report to your doctor or  health care professional as soon as possible: -allergic reactions like skin rash, itching or hives, swelling of the face, lips, or tongue -breathing problems -facial flushing -fast, irregular heartbeat -lump or soreness in the breast -signs and symptoms of herpes such as cold sore, shingles, or genital sores -signs and symptoms of infection like fever or chills, cough, sore throat, pain or trouble passing urine -signs and symptoms of low blood pressure like dizziness; feeling faint or lightheaded, falls; unusually weak or tired -signs and symptoms of progressive multifocal leukoencephalopathy (PML) like changes in vision; clumsiness; confusion; personality changes; weakness on one side of the body -swelling of the ankles, feet, hands Side effects that usually do not require medical attention (report these to your doctor or health care professional if they continue or are bothersome): -back pain -depressed mood -diarrhea -pain, redness, or irritation at site where injected This list may not describe all possible side effects. Call your doctor for medical advice about side effects. You may report side effects to FDA at 1-800-FDA-1088. Where should I keep my medicine? This drug is given in a hospital or clinic and will not be stored at home. NOTE: This sheet is a summary. It may not cover all possible information. If you have questions about this medicine, talk to your doctor, pharmacist, or health care provider.  2018 Elsevier/Gold Standard (2015-05-26 09:40:25  Methylprednisolone Solution for Injection What is this medicine? METHYLPREDNISOLONE (meth ill pred NISS oh lone) is a corticosteroid. It is commonly used to treat inflammation of the skin, joints, lungs, and other organs. Common conditions treated include asthma, allergies, and arthritis. It is also used for other conditions, such as blood disorders and diseases of the adrenal glands. This medicine may be used for other purposes; ask  your health care provider or pharmacist if you have questions. COMMON BRAND NAME(S): A-Methapred, Solu-Medrol What should I tell my health care provider before I take this medicine? They need to know if you have any of these conditions: -Cushing's syndrome -eye disease, vision problems -diabetes -glaucoma -heart disease -high blood pressure -infection (especially a virus infection such as chickenpox, cold sores, or herpes) -liver disease -mental illness -myasthenia gravis -osteoporosis -recently received or scheduled to receive a vaccine -seizures -stomach or intestine problems -thyroid disease -an unusual or allergic reaction to lactose, methylprednisolone, other medicines, foods, dyes, or preservatives -pregnant or trying to get pregnant -breast-feeding How should I use this medicine? This medicine is for injection or infusion into a vein. It is also for injection into a muscle. It is given by a health care professional in a hospital or clinic setting. Talk to your pediatrician regarding the use of this medicine in children. While this drug may be prescribed for selected conditions, precautions do apply. Overdosage: If you think you have taken too much of this medicine contact a poison control center or emergency room at once. NOTE: This medicine is only for you. Do not share this medicine with others. What if I miss a dose? This does not apply. What may interact with this medicine? Do not take this medicine with any of the following medications: -alefacept -echinacea -iopamidol -live virus vaccines -metyrapone -mifepristone This medicine may also interact with the following medications: -amphotericin B -aspirin and aspirin-like medicines -certain antibiotics like erythromycin,  clarithromycin, troleandomycin -certain medicines for diabetes -certain medicines for fungal infection like ketoconazole -certain medicines for seizures like carbamazepine, phenobarbital,  phenytoin -certain medicines that treat or prevent blood clots like warfarin -cyclosporine -digoxin -diuretics -female hormones, like estrogens and birth control pills -isoniazid -NSAIDS, medicines for pain and inflammation, like ibuprofen or naproxen -other medicines for myasthenia gravis -rifampin -vaccines This list may not describe all possible interactions. Give your health care provider a list of all the medicines, herbs, non-prescription drugs, or dietary supplements you use. Also tell them if you smoke, drink alcohol, or use illegal drugs. Some items may interact with your medicine. What should I watch for while using this medicine? Tell your doctor or healthcare professional if your symptoms do not start to get better or if they get worse. Do not stop taking except on your doctor's advice. You may develop a severe reaction. Your doctor will tell you how much medicine to take. Your condition will be monitored carefully while you are receiving this medicine. This medicine may increase your risk of getting an infection. Tell your doctor or health care professional if you are around anyone with measles or chickenpox, or if you develop sores or blisters that do not heal properly. This medicine may affect blood sugar levels. If you have diabetes, check with your doctor or health care professional before you change your diet or the dose of your diabetic medicine. Tell your doctor or health care professional right away if you have any change in your eyesight. Using this medicine for a long time may increase your risk of low bone mass. Talk to your doctor about bone health. What side effects may I notice from receiving this medicine? Side effects that you should report to your doctor or health care professional as soon as possible: -allergic reactions like skin rash, itching or hives, swelling of the face, lips, or tongue -bloody or tarry stools -changes in vision -hallucination, loss of  contact with reality -muscle cramps -muscle pain -palpitations -signs and symptoms of high blood sugar such as dizziness; dry mouth; dry skin; fruity breath; nausea; stomach pain; increased hunger or thirst; increased urination -signs and symptoms of infection like fever or chills; cough; sore throat; pain or trouble passing urine -trouble passing urine or change in the amount of urine Side effects that usually do not require medical attention (report to your doctor or health care professional if they continue or are bothersome): -changes in emotions or mood -constipation -diarrhea -excessive hair growth on the face or body -headache -nausea, vomiting -pain, redness, or irritation at site where injected -trouble sleeping -weight gain This list may not describe all possible side effects. Call your doctor for medical advice about side effects. You may report side effects to FDA at 1-800-FDA-1088. Where should I keep my medicine? This drug is given in a hospital or clinic and will not be stored at home. NOTE: This sheet is a summary. It may not cover all possible information. If you have questions about this medicine, talk to your doctor, pharmacist, or health care provider.  2018 Elsevier/Gold Standard (2015-04-16 16:21:28)  Diphenhydramine capsules or tablets What is this medicine? DIPHENHYDRAMINE (dye fen HYE dra meen) is an antihistamine. It is used to treat the symptoms of an allergic reaction. It is also used to treat Parkinson's disease. This medicine is also used to prevent and to treat motion sickness and as a nighttime sleep aid. This medicine may be used for other purposes; ask your health care provider or  pharmacist if you have questions. COMMON BRAND NAME(S): Alka-Seltzer Plus Allergy, Aller-G-Time, Banophen, Benadryl Allergy, Benadryl Allergy Dye Free, Benadryl Allergy Kapgel, Benadryl Allergy Ultratab, Diphedryl, Diphenhist, Genahist, PHARBEDRYL, Q-Dryl, Veto Kemps, Valu-Dryl,  Vicks ZzzQuil Nightime Sleep-Aid What should I tell my health care provider before I take this medicine? They need to know if you have any of these conditions: -asthma or lung disease -glaucoma -high blood pressure or heart disease -liver disease -pain or difficulty passing urine -prostate trouble -ulcers or other stomach problems -an unusual or allergic reaction to diphenhydramine, other medicines foods, dyes, or preservatives such as sulfites -pregnant or trying to get pregnant -breast-feeding How should I use this medicine? Take this medicine by mouth with a full glass of water. Follow the directions on the prescription label. Take your doses at regular intervals. Do not take your medicine more often than directed. To prevent motion sickness start taking this medicine 30 to 60 minutes before you leave. Talk to your pediatrician regarding the use of this medicine in children. Special care may be needed. Patients over 30 years old may have a stronger reaction and need a smaller dose. Overdosage: If you think you have taken too much of this medicine contact a poison control center or emergency room at once. NOTE: This medicine is only for you. Do not share this medicine with others. What if I miss a dose? If you miss a dose, take it as soon as you can. If it is almost time for your next dose, take only that dose. Do not take double or extra doses. What may interact with this medicine? Do not take this medicine with any of the following medications: -MAOIs like Carbex, Eldepryl, Marplan, Nardil, and Parnate This medicine may also interact with the following medications: -alcohol -barbiturates, like phenobarbital -medicines for bladder spasm like oxybutynin, tolterodine -medicines for blood pressure -medicines for depression, anxiety, or psychotic disturbances -medicines for movement abnormalities or Parkinson's disease -medicines for sleep -other medicines for cold, cough or  allergy -some medicines for the stomach like chlordiazepoxide, dicyclomine This list may not describe all possible interactions. Give your health care provider a list of all the medicines, herbs, non-prescription drugs, or dietary supplements you use. Also tell them if you smoke, drink alcohol, or use illegal drugs. Some items may interact with your medicine. What should I watch for while using this medicine? Visit your doctor or health care professional for regular check ups. Tell your doctor if your symptoms do not improve or if they get worse. Your mouth may get dry. Chewing sugarless gum or sucking hard candy, and drinking plenty of water may help. Contact your doctor if the problem does not go away or is severe. This medicine may cause dry eyes and blurred vision. If you wear contact lenses you may feel some discomfort. Lubricating drops may help. See your eye doctor if the problem does not go away or is severe. You may get drowsy or dizzy. Do not drive, use machinery, or do anything that needs mental alertness until you know how this medicine affects you. Do not stand or sit up quickly, especially if you are an older patient. This reduces the risk of dizzy or fainting spells. Alcohol may interfere with the effect of this medicine. Avoid alcoholic drinks. What side effects may I notice from receiving this medicine? Side effects that you should report to your doctor or health care professional as soon as possible: -allergic reactions like skin rash, itching or hives, swelling of the face,  lips, or tongue -changes in vision -confused, agitated, nervous -irregular or fast heartbeat -tremor -trouble passing urine -unusual bleeding or bruising -unusually weak or tired Side effects that usually do not require medical attention (report to your doctor or health care professional if they continue or are bothersome): -constipation, diarrhea -drowsy -headache -loss of appetite -stomach upset,  vomiting -thick mucous This list may not describe all possible side effects. Call your doctor for medical advice about side effects. You may report side effects to FDA at 1-800-FDA-1088. Where should I keep my medicine? Keep out of the reach of children. Store at room temperature between 15 and 30 degrees C (59 and 86 degrees F). Keep container closed tightly. Throw away any unused medicine after the expiration date. NOTE: This sheet is a summary. It may not cover all possible information. If you have questions about this medicine, talk to your doctor, pharmacist, or health care provider.  2018 Elsevier/Gold Standard (2007-05-28 17:06:22)

## 2016-07-05 NOTE — Telephone Encounter (Signed)
Received a phone call from Kim at [facility] short stay. Pt is there for her ocrevus infusion. Pt was given 2 tylenol and the solu-medrol infusion started but then the pt vomited about 300 cc. Pt feels better after vomiting. Pt is afebrile. Selena Batten is unsure if she vomited the tylenol, but wants Dr. Vickey Huger to be aware to see if Dr. Vickey Huger wanted to give the pt an extra tylenol or perhaps zofran.  I spoke to Dr. Vickey Huger, she gave me a verbal order to give to [facility] for pt to have a one time dose of zofran 4mg  IV, no additional tylenol.  I called Kim back and relayed this information and order to her. Kim verbalized understanding.

## 2016-07-19 ENCOUNTER — Encounter (HOSPITAL_COMMUNITY)
Admission: RE | Admit: 2016-07-19 | Discharge: 2016-07-19 | Disposition: A | Discharge status: Home / Self Care | Payer: Medicaid Other | Source: Ambulatory Visit | Attending: Neurology | Admitting: Neurology

## 2016-07-19 ENCOUNTER — Encounter (HOSPITAL_COMMUNITY): Payer: Self-pay

## 2016-07-19 DIAGNOSIS — G35 Multiple sclerosis: Secondary | ICD-10-CM | POA: Diagnosis not present

## 2016-07-19 MED ORDER — SODIUM CHLORIDE 0.9 % IV SOLN
300.0000 mg | INTRAVENOUS | Status: AC
  Administered 2016-07-19: 300 mg via INTRAVENOUS
  Filled 2016-07-19: qty 10

## 2016-07-19 MED ORDER — DIPHENHYDRAMINE HCL 25 MG PO CAPS
50.0000 mg | ORAL_CAPSULE | Freq: Once | ORAL | Status: AC
  Administered 2016-07-19: 50 mg via ORAL
  Filled 2016-07-19: qty 2

## 2016-07-19 MED ORDER — SODIUM CHLORIDE 0.9 % IV SOLN
INTRAVENOUS | Status: AC
  Administered 2016-07-19: 08:00:00 via INTRAVENOUS

## 2016-07-19 MED ORDER — ACETAMINOPHEN 325 MG PO TABS
650.0000 mg | ORAL_TABLET | ORAL | Status: AC
  Administered 2016-07-19: 650 mg via ORAL
  Filled 2016-07-19: qty 2

## 2016-07-19 MED ORDER — SODIUM CHLORIDE 0.9 % IV SOLN
500.0000 mg | INTRAVENOUS | Status: AC
  Administered 2016-07-19: 500 mg via INTRAVENOUS
  Filled 2016-07-19: qty 4

## 2016-07-19 NOTE — Discharge Instructions (Signed)
Ocrelizumab injection What is this medicine? OCRELIZUMAB (ok re LIZ ue mab) treats multiple sclerosis. It helps to decrease the number of multiple sclerosis relapses. It is not a cure. This medicine may be used for other purposes; ask your health care provider or pharmacist if you have questions. COMMON BRAND NAME(S): OCREVUS What should I tell my health care provider before I take this medicine? They need to know if you have any of these conditions: -cancer -hepatitis B infection -other infection (especially a virus infection such as chickenpox, cold sores, or herpes) -an unusual or allergic reaction to ocrelizumab, other medicines, foods, dyes or preservatives -pregnant or trying to get pregnant -breast-feeding How should I use this medicine? This medicine is for infusion into a vein. It is given by a health care professional in a hospital or clinic setting. Talk to your pediatrician regarding the use of this medicine in children. Special care may be needed. Overdosage: If you think you have taken too much of this medicine contact a poison control center or emergency room at once. NOTE: This medicine is only for you. Do not share this medicine with others. What if I miss a dose? Keep appointments for follow-up doses as directed. It is important not to miss your dose. Call your doctor or health care professional if you are unable to keep an appointment. What may interact with this medicine? -alemtuzumab -daclizumab -dimethyl fumarate -fingolimod -glatiramer -interferon beta -live virus vaccines -mitoxantrone -natalizumab -peginterferon beta -rituximab -steroid medicines like prednisone or cortisone -teriflunomide This list may not describe all possible interactions. Give your health care provider a list of all the medicines, herbs, non-prescription drugs, or dietary supplements you use. Also tell them if you smoke, drink alcohol, or use illegal drugs. Some items may interact with  your medicine. What should I watch for while using this medicine? Tell your doctor or healthcare professional if your symptoms do not start to get better or if they get worse. This medicine can cause serious allergic reactions. To reduce your risk you may need to take medicine before treatment with this medicine. Take your medicine as directed. Women should inform their doctor if they wish to become pregnant or think they might be pregnant. There is a potential for serious side effects to an unborn child. Talk to your health care professional or pharmacist for more information. Female patients should use effective birth control methods while receiving this medicine and for 6 months after the last dose. Call your doctor or health care professional for advice if you get a fever, chills or sore throat, or other symptoms of a cold or flu. Do not treat yourself. This drug decreases your body's ability to fight infections. Try to avoid being around people who are sick. If you have a hepatitis B infection or a history of a hepatitis B infection, talk to your doctor. The symptoms of hepatitis B may get worse if you take this medicine. In some patients, this medicine may cause a serious brain infection that may cause death. If you have any problems seeing, thinking, speaking, walking, or standing, tell your doctor right away. If you cannot reach your doctor, urgently seek other source of medical care. This medicine can decrease the response to a vaccine. If you need to get vaccinated, tell your healthcare professional if you have received this medicine. Extra booster doses may be needed. Talk to your doctor to see if a different vaccination schedule is needed. Talk to your doctor about your risk of cancer.   You may be more at risk for certain types of cancers if you take this medicine. What side effects may I notice from receiving this medicine? Side effects that you should report to your doctor or health care  professional as soon as possible: -allergic reactions like skin rash, itching or hives, swelling of the face, lips, or tongue -breathing problems -facial flushing -fast, irregular heartbeat -lump or soreness in the breast -signs and symptoms of herpes such as cold sore, shingles, or genital sores -signs and symptoms of infection like fever or chills, cough, sore throat, pain or trouble passing urine -signs and symptoms of low blood pressure like dizziness; feeling faint or lightheaded, falls; unusually weak or tired -signs and symptoms of progressive multifocal leukoencephalopathy (PML) like changes in vision; clumsiness; confusion; personality changes; weakness on one side of the body -swelling of the ankles, feet, hands Side effects that usually do not require medical attention (report these to your doctor or health care professional if they continue or are bothersome): -back pain -depressed mood -diarrhea -pain, redness, or irritation at site where injected This list may not describe all possible side effects. Call your doctor for medical advice about side effects. You may report side effects to FDA at 1-800-FDA-1088. Where should I keep my medicine? This drug is given in a hospital or clinic and will not be stored at home. NOTE: This sheet is a summary. It may not cover all possible information. If you have questions about this medicine, talk to your doctor, pharmacist, or health care provider.  2018 Elsevier/Gold Standard (2015-05-26 09:40:25)  

## 2016-07-19 NOTE — Progress Notes (Signed)
Day 15 (2nd infusion) of Ocrevus 300mg  IV.  Premeds given and pt did well with the infusion today.  No complaints.  Pt given next appointment for November.  Pt's mom accompanied her today.  Pt infusion was complete at 1227 and pt stayed for the 1 hour post infusion observation and was d/c at 1340.  Pt was d/c ambulatory with her mom to lobby.

## 2016-07-20 ENCOUNTER — Other Ambulatory Visit: Payer: Self-pay | Admitting: Neurology

## 2016-07-20 DIAGNOSIS — G35 Multiple sclerosis: Secondary | ICD-10-CM

## 2016-07-20 DIAGNOSIS — R296 Repeated falls: Secondary | ICD-10-CM

## 2016-08-02 ENCOUNTER — Encounter: Payer: Self-pay | Admitting: Adult Health

## 2016-08-02 ENCOUNTER — Ambulatory Visit (INDEPENDENT_AMBULATORY_CARE_PROVIDER_SITE_OTHER): Payer: Medicaid Other | Admitting: Adult Health

## 2016-08-02 VITALS — BP 130/73 | HR 75 | Ht 64.0 in | Wt 326.0 lb

## 2016-08-02 DIAGNOSIS — Z5181 Encounter for therapeutic drug level monitoring: Secondary | ICD-10-CM | POA: Diagnosis not present

## 2016-08-02 DIAGNOSIS — G35 Multiple sclerosis: Secondary | ICD-10-CM | POA: Diagnosis not present

## 2016-08-02 MED ORDER — ALPRAZOLAM 1 MG PO TABS: 1.0000 mg | ORAL_TABLET | Freq: Every evening | ORAL | 0 refills | Status: DC | PRN

## 2016-08-02 MED ORDER — TRAMADOL HCL 50 MG PO TABS: 50.0000 mg | ORAL_TABLET | Freq: Four times a day (QID) | ORAL | 0 refills | Status: DC | PRN

## 2016-08-02 NOTE — Patient Instructions (Signed)
Continue Ocrevus  Blood work today Xanax and tramadol refilled If your symptoms worsen or you develop new symptoms please let us know.

## 2016-08-02 NOTE — Progress Notes (Signed)
PATIENT: Brooke [name] DOB: 1985-12-11  REASON FOR VISIT: follow up- multiple sclerosis HISTORY FROM: patient  HISTORY OF PRESENT ILLNESS: Today 08/02/2016 Brooke [name] is a 31 year old female with a history of multiple sclerosis. She returns today for follow-up. She is now on Ocrevus and had had two doses. She reports that she has tolerated medication well. She reports that ishe continues to have weakness in the left leg. At times she feels that it is worse. Reports that she will occasionally drag the left leg when ambulating. Denies any significant changes with her gait or balance. She still states that at times she will stagger. She does report blurry vision particularly when she reads her drops. Reports that she used to wear glasses but has not been able to follow-up with her ophthalmologist. She does note that she has experienced some bowel incontinence. She states that she had an episode last night and her that it was approximately 1 month ago. The patient had repeat MRI of the brain, cervical spine and thoracic spine in March that did not show any active lesions. Patient returns today for an evaluation.  HISTORY3-07-2016, [name] is seen for a 29 month interval visit. She received a phone call in autumn of last year stating that she has tested negative for hepatitis and for tuberculosis and that she could be a candidate for reverse. We had eliminated Tysabri from her list as possible medication due to a positive JC virus panel. [name] them had briefly dropped out of Medicaid, and could not make a new appointment in our office for the last 3 month until the issue was settled. Due to this problem with insurance coverage she was unable to obtain an MRI of the brain and C-spine is recommended for February of this year and I will have to reorder it today.    REVIEW OF SYSTEMS: Out of a complete 14 system review of symptoms, the patient complains only of the following symptoms, and  all other reviewed systems are negative.  Activity change, hearing loss, blurred vision, shortness of breath, excessive thirst, back pain, walking, tremor, weakness, speech difficulty, confusion, decreased concentration, nervous/changes, bruise/bleed easily  ALLERGIES: Allergies  Allergen Reactions  . Lexapro [Escitalopram Oxalate] Other (See Comments)    Suicidal thoughts, lowered libido  . Latex Hives    HOME MEDICATIONS: Outpatient Medications Prior to Visit  Medication Sig Dispense Refill  . ALPRAZolam (XANAX) 1 MG tablet Take 1 tablet (1 mg total) by mouth at bedtime as needed for anxiety. 15 tablet 0  . ferrous sulfate 325 (65 FE) MG tablet Take 325 mg by mouth every morning.    . gabapentin (NEURONTIN) 300 MG capsule TAKE ONE CAPSULE BY MOUTH TWICE A DAY AFTER LUNCH, AND AT BEDTIME 60 capsule 5  . ibuprofen (ADVIL,MOTRIN) 800 MG tablet Take 1 tablet (800 mg total) by mouth 3 (three) times daily. 21 tablet 0  . ketorolac (TORADOL) 10 MG tablet Take 1 tablet (10 mg total) by mouth every 6 (six) hours as needed. 10 tablet 0  . Multiple Vitamin (MULTIVITAMIN) tablet Take 1 tablet by mouth daily.    . naproxen (NAPROSYN) 250 MG tablet Take 1 tablet (250 mg total) by mouth 2 (two) times daily as needed for mild pain or moderate pain (take with food). 14 tablet 0  . ocrelizumab (OCREVUS) 300 MG/10ML injection Ocrelizumab 300mg  IV on day one, repeat 300mg  IV on day 15.  Then, every 6 mos. infuse 600mg  IV. 2 each 1  .  Dimethyl Fumarate 240 MG CPDR Take 1 capsule (240 mg total) by mouth 2 (two) times daily. 60 capsule 5   No facility-administered medications prior to visit.     PAST MEDICAL HISTORY: Past Medical History:  Diagnosis Date  . Anxiety   . Chronic back pain   . Edema   . MS (multiple sclerosis) (HCC) 09/29/2014    PAST SURGICAL HISTORY: Past Surgical History:  Procedure Laterality Date  . APPENDECTOMY    . CHOLECYSTECTOMY N/A 10/07/2013   Procedure: LAPAROSCOPIC  CHOLECYSTECTOMY;  Surgeon: Dalia Heading, MD;  Location: [facility];  Service: General;  Laterality: N/A;  . LAPAROSCOPIC APPENDECTOMY N/A 05/12/2015   Procedure: APPENDECTOMY LAPAROSCOPIC;  Surgeon: Franky Macho, MD;  Location: [facility];  Service: General;  Laterality: N/A;  . WISDOM TOOTH EXTRACTION      FAMILY HISTORY: Family History  Problem Relation Age of Onset  . Diabetes Mother   . Colon polyps Mother        hx of cancer  . Other Mother        DDD Lumber, cervical  . Sleep apnea Father     SOCIAL HISTORY: Social History   Social History  . Marital status: Single    Spouse name: N/A  . Number of children: N/A  . Years of education: 2 y colleg   Occupational History  . work    Social History Main Topics  . Smoking status: Current Every Day Smoker    Last attempt to quit: 09/16/2007  . Smokeless tobacco: Never Used  . Alcohol use No  . Drug use: No     Comment: no drug use since 2010  . Sexual activity: No   Other Topics Concern  . Not on file   Social History Narrative   Drinks 1-2 large glasses of caffeine daily.            PHYSICAL EXAM  Vitals:   08/02/16 1053  BP: 130/73  Pulse: 75  Weight: (!) 326 lb (147.9 kg)  Height: 5\' 4"  (1.626 m)   Body mass index is 55.96 kg/m.  Generalized: Well developed, in no acute distress, obese  Neurological examination  Mentation: Alert oriented to time, place, history taking. Follows all commands speech and language fluent Cranial nerve II-XII: Pupils were equal round reactive to light. Extraocular movements were full, visual field were full on confrontational test. Facial sensation and strength were normal. Uvula tongue midline. Head turning and shoulder shrug  were normal and symmetric. Motor: The motor testing reveals 5 over 5 strength of all 4 extremities. Good symmetric motor tone is noted throughout.  Sensory: Sensory testing is intact to soft touch on all 4 extremities. No evidence of extinction is noted.    Coordination: Cerebellar testing reveals good finger-nose-finger and heel-to-shin bilaterally.  Gait and station: Gait is normal. Tandem gait is slightly unsteady. Romberg is negative. No drift is seen.  Reflexes: Deep tendon reflexes are symmetric and normal bilaterally.   DIAGNOSTIC DATA (LABS, IMAGING, TESTING) - I reviewed patient records, labs, notes, testing and imaging myself where available.  Lab Results  Component Value Date   WBC 3.6 04/26/2016   HGB 12.0 04/26/2016   HCT 34.0 04/26/2016   MCV 85 04/26/2016   PLT 253 04/26/2016      Component Value Date/Time   NA 138 04/26/2016 1341   K 4.4 04/26/2016 1341   CL 98 04/26/2016 1341   CO2 25 04/26/2016 1341   GLUCOSE 100 (H) 04/26/2016 1341  GLUCOSE 123 (H) 05/12/2015 1651   BUN 9 04/26/2016 1341   CREATININE 0.76 04/26/2016 1341   CALCIUM 8.9 04/26/2016 1341   PROT 6.8 04/26/2016 1341   ALBUMIN 3.8 04/26/2016 1341   AST 15 04/26/2016 1341   ALT 16 04/26/2016 1341   ALKPHOS 61 04/26/2016 1341   BILITOT 0.4 04/26/2016 1341   GFRNONAA 107 04/26/2016 1341   GFRAA 123 04/26/2016 1341      ASSESSMENT AND PLAN 31 y.o. year old female  has a past medical history of Anxiety; Chronic back pain; Edema; and MS (multiple sclerosis) (HCC) (09/29/2014). here with:  1. Multiple sclerosis  The patient's physical exam is relatively unremarkable. She will continue on Ocrevus. I will check blood work today. Patient has had a few episodes of bowel incontinence. I have asked the patient to monitor this. If she finds that this is occurring more frequently we will need to evaluate. This of course could be due to multiple sclerosis however she does have a family history of polyps in the colon and IBS. Patient advised that if her symptoms worsen or she develops new symptoms she should let me know. She will follow-up in 6 months or sooner if needed.  I spent 15 minutes with the patient. 50% of this time was spent discussing symptoms  related to multiple sclerosis.    Butch Penny, MSN, NP-C 08/02/2016, 10:59 AM [facility] [address] [place], [place] [address] [phone]

## 2016-08-02 NOTE — Progress Notes (Signed)
I have read the note, and I agree with the clinical assessment and plan.  Mechell Girgis KEITH   

## 2016-08-03 ENCOUNTER — Telehealth: Payer: Self-pay | Admitting: *Deleted

## 2016-08-03 LAB — CBC WITH DIFFERENTIAL/PLATELET
Basophils Absolute: 0 10*3/uL (ref 0.0–0.2)
Basos: 0 %
EOS (ABSOLUTE): 0.1 10*3/uL (ref 0.0–0.4)
Eos: 1 %
HEMATOCRIT: 34.2 % (ref 34.0–46.6)
HEMOGLOBIN: 12.4 g/dL (ref 11.1–15.9)
IMMATURE GRANS (ABS): 0 10*3/uL (ref 0.0–0.1)
Immature Granulocytes: 0 %
LYMPHS ABS: 1.2 10*3/uL (ref 0.7–3.1)
LYMPHS: 28 %
MCH: 29.5 pg (ref 26.6–33.0)
MCHC: 36.3 g/dL — AB (ref 31.5–35.7)
MCV: 81 fL (ref 79–97)
MONOCYTES: 8 %
Monocytes Absolute: 0.3 10*3/uL (ref 0.1–0.9)
NEUTROS ABS: 2.7 10*3/uL (ref 1.4–7.0)
Neutrophils: 63 %
PLATELETS: 255 10*3/uL (ref 150–379)
RBC: 4.2 x10E6/uL (ref 3.77–5.28)
RDW: 13.9 % (ref 12.3–15.4)
WBC: 4.3 10*3/uL (ref 3.4–10.8)

## 2016-08-03 LAB — COMPREHENSIVE METABOLIC PANEL
ALBUMIN: 3.8 g/dL (ref 3.5–5.5)
ALT: 11 IU/L (ref 0–32)
AST: 12 IU/L (ref 0–40)
Albumin/Globulin Ratio: 1.4 (ref 1.2–2.2)
Alkaline Phosphatase: 57 IU/L (ref 39–117)
BUN / CREAT RATIO: 8 — AB (ref 9–23)
BUN: 5 mg/dL — AB (ref 6–20)
Bilirubin Total: 0.4 mg/dL (ref 0.0–1.2)
CO2: 23 mmol/L (ref 20–29)
Calcium: 9.1 mg/dL (ref 8.7–10.2)
Chloride: 102 mmol/L (ref 96–106)
Creatinine, Ser: 0.65 mg/dL (ref 0.57–1.00)
GFR calc non Af Amer: 121 mL/min/{1.73_m2} (ref >59)
GFR, EST AFRICAN AMERICAN: 140 mL/min/{1.73_m2} (ref >59)
GLUCOSE: 91 mg/dL (ref 65–99)
Globulin, Total: 2.7 g/dL (ref 1.5–4.5)
Potassium: 4.7 mmol/L (ref 3.5–5.2)
Sodium: 139 mmol/L (ref 134–144)
TOTAL PROTEIN: 6.5 g/dL (ref 6.0–8.5)

## 2016-08-03 NOTE — Telephone Encounter (Signed)
Called and spoke with pt about lab results per MM,NP note. She verbalized understanding.

## 2016-08-03 NOTE — Telephone Encounter (Signed)
-----   Message from Butch Penny, NP sent at 08/03/2016  8:23 AM EDT ----- BUN and BUN/Creatinine ratio is slightly low. We will continue to monitor. Please fax labwork to PCP. Call patient with results.

## 2016-10-19 ENCOUNTER — Telehealth: Payer: Self-pay | Admitting: Neurology

## 2016-10-19 NOTE — Telephone Encounter (Signed)
Brooke [name]/[facility] [phone] called she said the pt does not have traMADol (ULTRAM) 50 MG tablet . Was this written and given to the pt or faxed to a pharmacy? Please call to advise She is also going to fax [phone]) to clinic copy of PCA ([place] form).

## 2016-10-20 NOTE — Telephone Encounter (Signed)
Called and left a VM for Ms Brooke [name]. Pt was given this medication at her apt in June. The script looks like it could have been sent to [facility] in [place]. It was refilled at that time. LVM for pt to call back

## 2016-11-01 NOTE — Telephone Encounter (Signed)
Brooke [name] has called with [facility] and they follow up with the patients. During their assessment and home visit they were told by patient that she was never really told what MS was and that she had it. She was only told that she had lesions in certain areas. I informed Ms Brooke [name] that we have had multiple visits with the patient and that she has had education on this. This partnership has helped in caring for needs and they also said the patient struggles with anxiety and depression and they are in the works of getting her a mental health examination. They stated that her PH Q9 was 16 and they feel that some mental health issues could be impacting her ability to process and learn the information. They will send a copy of their DPR form to [place] so that we may be able to further communicate with them if we need to

## 2016-11-30 ENCOUNTER — Emergency Department (HOSPITAL_COMMUNITY)
Admission: EM | Admit: 2016-11-30 | Discharge: 2016-11-30 | Disposition: A | Discharge status: Home / Self Care | Payer: Medicaid Other | Attending: Emergency Medicine | Admitting: Emergency Medicine

## 2016-11-30 ENCOUNTER — Encounter (HOSPITAL_COMMUNITY): Payer: Self-pay | Admitting: *Deleted

## 2016-11-30 DIAGNOSIS — Z91040 Latex allergy status: Secondary | ICD-10-CM | POA: Diagnosis not present

## 2016-11-30 DIAGNOSIS — Z87891 Personal history of nicotine dependence: Secondary | ICD-10-CM | POA: Diagnosis not present

## 2016-11-30 DIAGNOSIS — B369 Superficial mycosis, unspecified: Secondary | ICD-10-CM | POA: Insufficient documentation

## 2016-11-30 DIAGNOSIS — R21 Rash and other nonspecific skin eruption: Secondary | ICD-10-CM | POA: Diagnosis present

## 2016-11-30 MED ORDER — CLOTRIMAZOLE 1 % EX CREA: TOPICAL_CREAM | CUTANEOUS | 0 refills | Status: AC

## 2016-11-30 NOTE — ED Notes (Signed)
Pt alert & oriented x4, stable gait. Patient given discharge instructions, paperwork & prescription(s). Registration completed in the room.  Patient verbalized understanding. Pt left department w/ no further questions. 

## 2016-11-30 NOTE — ED Provider Notes (Signed)
Emergency Department Provider Note   I have reviewed the triage vital signs and the nursing notes.   HISTORY  Chief Complaint Rash   HPI Brooke [name] is a 31 y.o. female presents to the emergency department for evaluation of rash under bilateral breasts. Rash began 2 weeks ago has been gradually worsening. She describes itching to the area and occasional whitish appearance. She reports rash only under the breasts. No fevers or chills. No similar rash in the past. She states she's applied the outer to the area trying keep it dry. She has also been applying oatmeal with no relief. No radiation of symptoms. No modifying factors.    Past Medical History:  Diagnosis Date  . Anxiety   . Chronic back pain   . Edema   . MS (multiple sclerosis) (HCC) 09/29/2014    Patient Active Problem List   Diagnosis Date Noted  . Acute appendicitis 05/12/2015  . MS (multiple sclerosis) (HCC) 09/29/2014  . Positive ANA (antinuclear antibody) 07/02/2014  . Abnormal MRI of head 04/22/2014  . Transient diplopia 04/22/2014  . 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 Spell of memory loss 04/22/2014  . Recurrent falls while walking 04/22/2014    Past Surgical History:  Procedure Laterality Date  . APPENDECTOMY    . CHOLECYSTECTOMY N/A 10/07/2013   Procedure: LAPAROSCOPIC CHOLECYSTECTOMY;  Surgeon: Dalia Heading, MD;  Location: [facility];  Service: General;  Laterality: N/A;  . LAPAROSCOPIC APPENDECTOMY N/A 05/12/2015   Procedure: APPENDECTOMY LAPAROSCOPIC;  Surgeon: Franky Macho, MD;  Location: [facility];  Service: General;  Laterality: N/A;  . WISDOM TOOTH EXTRACTION      Current Outpatient Rx  . Order #: [id number] Class: Print  . Order #: [id number] Class: Print  . Order #: [id number] Class: Historical Med  . Order #: [id number] Class: Normal  . Order #: [id number] Class: Print  . Order #: [id number] Class: Historical Med  . Order #: [id number] Class: Print  . Order #: [id number] Class: Print  . Order #: [id number] Class:  Print    Allergies Lexapro [escitalopram oxalate] and Latex  Family History  Problem Relation Age of Onset  . Diabetes Mother   . Colon polyps Mother        hx of cancer  . Other Mother        DDD Lumber, cervical  . Sleep apnea Father     Social History Social History  Substance Use Topics  . Smoking status: Former Smoker    Quit date: 09/16/2007  . Smokeless tobacco: Never Used  . Alcohol use No    Review of Systems  Constitutional: No fever/chills Eyes: No visual changes. ENT: No sore throat. Cardiovascular: Denies chest pain. Respiratory: Denies shortness of breath. Gastrointestinal: No abdominal pain.  No nausea, no vomiting.  No diarrhea.  No constipation. Genitourinary: Negative for dysuria. Musculoskeletal: Negative for back pain. Skin: Positive for rash.  Neurological: Negative for headaches, focal weakness or numbness.  10-point ROS otherwise negative.  ____________________________________________   PHYSICAL EXAM:  VITAL SIGNS: ED Triage Vitals  Enc Vitals Group     BP 11/30/16 2044 114/61     Pulse Rate 11/30/16 2044 94     Resp 11/30/16 2044 16     Temp 11/30/16 2044 98.3 F (36.8 C)     Temp Source 11/30/16 2041 Oral     SpO2 11/30/16 2044 100 %     Weight 11/30/16 2042 (!) 325 lb (147.4 kg)     Height 11/30/16 2042  (1.626 m)     Pain  Score 11/30/16 2041 4   Constitutional: Alert and oriented. Well appearing and in no acute distress. Eyes: Conjunctivae are normal. Head: Atraumatic. Nose: No congestion/rhinnorhea. Mouth/Throat: Mucous membranes are moist.   Neck: No stridor.  Cardiovascular: Normal rate, regular rhythm. Good peripheral circulation. Grossly normal heart sounds.   Respiratory: Normal respiratory effort.  No retractions. Lungs CTAB. Gastrointestinal: Soft and nontender. No distention.  Musculoskeletal: No lower extremity tenderness nor edema. No gross deformities of extremities. Neurologic:  Normal speech and  language. No gross focal neurologic deficits are appreciated.  Skin:  Skin is warm, dry and intact. Erythema with white discoloration under bilateral breast. No cellulitis.   ____________________________________________   PROCEDURES  Procedure(s) performed:   Procedures  None ____________________________________________   INITIAL IMPRESSION / ASSESSMENT AND PLAN / ED COURSE  Pertinent labs & imaging results that were available during my care of the patient were reviewed by me and considered in my medical decision making (see chart for details).  Patient presents to the emergency room in for evaluation of rash under bilateral breasts. The patient has area of erythema with some whitish discoloration concerning for fungal/yeast infection. Plan to treat with topical antifungals. Instructed the patient to keep the area clean and dry. Will follow with her primary care physician.  At this time, I do not feel there is any life-threatening condition present. I have reviewed and discussed all results (EKG, imaging, lab, urine as appropriate), exam findings with patient. I have reviewed nursing notes and appropriate previous records.  I feel the patient is safe to be discharged home without further emergent workup. Discussed usual and customary return precautions. Patient and family (if present) verbalize understanding and are comfortable with this plan.  Patient will follow-up with their primary care provider. If they do not have a primary care provider, information for follow-up has been provided to them. All questions have been answered.  ____________________________________________  FINAL CLINICAL IMPRESSION(S) / ED DIAGNOSES  Final diagnoses:  Fungal rash of torso     MEDICATIONS GIVEN DURING THIS VISIT:  Medications - No data to display   NEW OUTPATIENT MEDICATIONS STARTED DURING THIS VISIT:  Discharge Medication List as of 11/30/2016  9:04 PM    START taking these medications     Details  clotrimazole (LOTRIMIN) 1 % cream Apply to affected area 2 times daily, Print        Note:  This document was prepared using Dragon voice recognition software and may include unintentional dictation errors.  Alona Bene, MD Emergency Medicine    Long, Arlyss Repress, MD 12/01/16 [phone]

## 2016-11-30 NOTE — Discharge Instructions (Signed)
You were seen in the ED today with a rash that is likely from a yeast or fungus. We are treating with a cream that you will apply twice daily for at least the next two weeks. Follow up with your PCP. Keep the area otherwise clean and dry.

## 2016-11-30 NOTE — ED Triage Notes (Signed)
Pt c/o rash under bilateral breast area for the past two weeks,

## 2016-12-05 ENCOUNTER — Encounter: Payer: Self-pay | Admitting: Neurology

## 2016-12-05 ENCOUNTER — Ambulatory Visit (INDEPENDENT_AMBULATORY_CARE_PROVIDER_SITE_OTHER): Payer: Medicaid Other | Admitting: Neurology

## 2016-12-05 VITALS — BP 122/82 | HR 84 | Ht 64.0 in | Wt 323.0 lb

## 2016-12-05 DIAGNOSIS — G35 Multiple sclerosis: Secondary | ICD-10-CM | POA: Diagnosis not present

## 2016-12-05 MED ORDER — MODAFINIL 200 MG PO TABS: 200.0000 mg | ORAL_TABLET | Freq: Every day | ORAL | 3 refills | Status: DC

## 2016-12-05 NOTE — Patient Instructions (Signed)

## 2016-12-05 NOTE — Progress Notes (Signed)
PATIENT: Brooke [name] DOB: [birth year]  REASON FOR VISIT: follow up- multiple sclerosis HISTORY FROM: patient  HISTORY OF PRESENT ILLNESS:  Interval history from 12/05/2016. Brooke [name] has sadly reported that her mother ( 70) is deadly ill, and that she is currently on life support at [facility]. She is understandably depressed and sad. In relation to her multiple sclerosis condition she feels that create was has been able to stall the development of new symptoms or relapses, but that she feels drained, has loss of energy, she feels weak and week her over the course of the last months. She would like to shorten the interval. In which revisit from 6 months to 3 months, I will also provide a medication that helps her with energy, namely modafinil. If modafinil is denied I would be happy to give her out of old just to make sure that she can function during daytime.   Today 08/02/2016 Brooke [name] is a 31 year old female with a history of multiple sclerosis. She returns today for follow-up. She is now on Ocrevus and had had two doses. She reports that she has tolerated medication well. She reports that ishe continues to have weakness in the left leg. At times she feels that it is worse. Reports that she will occasionally drag the left leg when ambulating. Denies any significant changes with her gait or balance. She still states that at times she will stagger. She does report blurry vision particularly when she reads her drops. Reports that she used to wear glasses but has not been able to follow-up with her ophthalmologist. She does note that she has experienced some bowel incontinence. She states that she had an episode last night and her that it was approximately 1 month ago. The patient had repeat MRI of the brain, cervical spine and thoracic spine in March that did not show any active lesions. Patient returns today for an evaluation.  HISTORY3-07-2016, Brooke [name] is seen for a 6 month  interval visit. She received a phone call in autumn of last year stating that she has tested negative for hepatitis and for tuberculosis and that she could be a candidate for OCREVUS- We had eliminated Tysabri from her list as possible medication due to a positive JC virus panel. Brooke [name] them had briefly dropped out of Medicaid, and could not make a new appointment in our office for the last 3 month until the issue was settled. Due to this problem with insurance coverage she was unable to obtain an MRI of the brain and C-spine is recommended for February of this year and I will have to reorder it today.    REVIEW OF SYSTEMS: Out of a complete 14 system review of symptoms, the patient complains only of the following symptoms, and all other reviewed systems are negative.  Depression, weakness, depression. Activity change, hearing loss, blurred vision, shortness of breath, excessive thirst, back pain, walking, tremor, weakness, speech difficulty, confusion, decreased concentration, nervous/changes, bruise/bleed easily  ALLERGIES: Allergies  Allergen Reactions  . Lexapro [Escitalopram Oxalate] Other (See Comments)    Suicidal thoughts, lowered libido  . Latex Hives    HOME MEDICATIONS: Outpatient Medications Prior to Visit  Medication Sig Dispense Refill  . ALPRAZolam (XANAX) 1 MG tablet Take 1 tablet (1 mg total) by mouth at bedtime as needed for anxiety. 15 tablet 0  . clotrimazole (LOTRIMIN) 1 % cream Apply to affected area 2 times daily 15 g 0  . ferrous sulfate 325 (65 FE) MG  tablet Take 325 mg by mouth every morning.    . gabapentin (NEURONTIN) 300 MG capsule TAKE ONE CAPSULE BY MOUTH TWICE A DAY AFTER LUNCH, AND AT BEDTIME 60 capsule 5  . Multiple Vitamin (MULTIVITAMIN) tablet Take 1 tablet by mouth daily.    . naproxen (NAPROSYN) 250 MG tablet Take 1 tablet (250 mg total) by mouth 2 (two) times daily as needed for mild pain or moderate pain (take with food). 14 tablet 0  .  ocrelizumab (OCREVUS) 300 MG/10ML injection Ocrelizumab  IV on day one, repeat  IV on day 15.  Then, every 6 mos. infuse  IV. 2 each 1  . traMADol (ULTRAM) 50 MG tablet Take 1 tablet (50 mg total) by mouth every 6 (six) hours as needed for severe pain. 30 tablet 0  . ibuprofen (ADVIL,MOTRIN) 800 MG tablet Take 1 tablet (800 mg total) by mouth 3 (three) times daily. 21 tablet 0   No facility-administered medications prior to visit.     PAST MEDICAL HISTORY: Past Medical History:  Diagnosis Date  . Anxiety   . Chronic back pain   . Edema   . Brooke (multiple sclerosis) (HCC) 09/29/2014    PAST SURGICAL HISTORY: Past Surgical History:  Procedure Laterality Date  . APPENDECTOMY    . CHOLECYSTECTOMY N/A 10/07/2013   Procedure: LAPAROSCOPIC CHOLECYSTECTOMY;  Surgeon: Dalia Heading, MD;  Location: [facility];  Service: General;  Laterality: N/A;  . LAPAROSCOPIC APPENDECTOMY N/A 05/12/2015   Procedure: APPENDECTOMY LAPAROSCOPIC;  Surgeon: Franky Macho, MD;  Location: [facility];  Service: General;  Laterality: N/A;  . WISDOM TOOTH EXTRACTION      FAMILY HISTORY: Family History  Problem Relation Age of Onset  . Diabetes Mother   . Colon polyps Mother        hx of cancer  . Other Mother        DDD Lumber, cervical  . Sleep apnea Father     SOCIAL HISTORY: Social History   Social History  . Marital status: Single    Spouse name: N/A  . Number of children: N/A  . Years of education: 2 y colleg   Occupational History  . work    Social History Main Topics  . Smoking status: Former Smoker    Quit date: 09/16/2007  . Smokeless tobacco: Never Used  . Alcohol use No  . Drug use: No     Comment: no drug use since 2010  . Sexual activity: No   Other Topics Concern  . Not on file   Social History Narrative   Drinks 1-2 large glasses of caffeine daily.            PHYSICAL EXAM  Vitals:   12/05/16 1033  BP: 122/82  Pulse: 84  Weight: (!) 323 lb (146.5 kg)  Height:   (1.626 m)   Body mass index is 55.44 kg/m.  Generalized: Well developed, in no acute distress, obese  Neurological examination  Mentation: Alert oriented to time, place, history taking. Follows all commands speech and language fluent Cranial nerve :  No change in taste or smell. Pupils were equal round reactive to light. Extraocular movements were full, visual field were full on confrontational test. Facial sensation and strength were normal. Uvula tongue midline. Head turning and shoulder shrug  were normal and symmetric. Motor:  5 /5 strength of all 4 extremities,symmetric motor tone is noted throughout.  Sensory: Sensory testing is intact to vibration, soft touch on her trunk and on  all 4 extremities.  Coordination: deferred.  Gait and station: Gait is normal. Tandem gait is still slightly unsteady. Romberg - drift is not seen.  Reflexes: Deep tendon reflexes are symmetrically attenuated and normal bilaterally.   DIAGNOSTIC DATA (LABS, IMAGING, TESTING) - I reviewed patient records, labs, notes, testing and imaging myself where available.  Lab Results  Component Value Date   WBC 4.3 08/02/2016   HGB 12.4 08/02/2016   HCT 34.2 08/02/2016   MCV 81 08/02/2016   PLT 255 08/02/2016      Component Value Date/Time   NA 139 08/02/2016 1132   K 4.7 08/02/2016 1132   CL 102 08/02/2016 1132   CO2 23 08/02/2016 1132   GLUCOSE 91 08/02/2016 1132   GLUCOSE 123 (H) 05/12/2015 1651   BUN 5 (L) 08/02/2016 1132   CREATININE 0.65 08/02/2016 1132   CALCIUM 9.1 08/02/2016 1132   PROT 6.5 08/02/2016 1132   ALBUMIN 3.8 08/02/2016 1132   AST 12 08/02/2016 1132   ALT 11 08/02/2016 1132   ALKPHOS 57 08/02/2016 1132   BILITOT 0.4 08/02/2016 1132   GFRNONAA 121 08/02/2016 1132   GFRAA 140 08/02/2016 1132      ASSESSMENT AND PLAN 31 y.o. year old female  has a past medical history of Anxiety; Chronic back pain; Edema; and Brooke (multiple sclerosis) (HCC) (09/29/2014). here with:  1.  Multiple sclerosis  The patient's physical exam is relatively unremarkable. She will continue on Ocrevus. I will check blood work today. Patient has had a few episodes of bowel incontinence.  I have asked the patient to monitor this. If she finds that this is occurring more frequently we will need to evaluate by more MRI studies.    This of course could be due to multiple sclerosis - She will follow-up in 3 months or sooner if needed with NP   I spent 35 minutes with the patient. 50% of this time was spent discussing symptoms related to multiple sclerosis. She continues with OCREVUS, and has not had relapses on the medication, but feels fatigued. She feels heavy and weak, and we started some Modafinil- Plan B is  Adderall.      Melvyn Novas, MD   12/05/2016, 11:04 AM [facility] [address], [address] [place], [place] [address] [phone]

## 2016-12-12 ENCOUNTER — Other Ambulatory Visit: Payer: Self-pay | Admitting: Neurology

## 2016-12-12 ENCOUNTER — Telehealth: Payer: Self-pay | Admitting: Neurology

## 2016-12-12 MED ORDER — PROVIGIL 200 MG PO TABS: 200.0000 mg | ORAL_TABLET | Freq: Every day | ORAL | 5 refills | Status: DC

## 2016-12-12 NOTE — Telephone Encounter (Signed)
PA sent over phone with [place] [employer]. PA approved for Provigil 200mg  tab. Approved until 12/07/2017.  REF# [id number]. REF ID for the call with [facility]- [id number]

## 2016-12-26 MED FILL — OCREVUS 300 MG/10ML SOLN: 300 | 15 days supply | Qty: 20 | Fill #1

## 2016-12-28 ENCOUNTER — Telehealth: Payer: Self-pay | Admitting: Neurology

## 2016-12-28 NOTE — Telephone Encounter (Signed)
I have never schedule OCREVUS outside our [facility] office. I will ask Faith for help. CD

## 2016-12-28 NOTE — Telephone Encounter (Signed)
Dee/[facility] short stay [phone] needs orders for ocrevus infusion. She said pt is due infusion. Thank you

## 2016-12-29 NOTE — Telephone Encounter (Signed)
Orders written and ready to be faxed to [facility] once signed.   Active PA on file thru 05/14/17./fim

## 2016-12-29 NOTE — Telephone Encounter (Signed)
thank you, very very much.

## 2017-01-05 NOTE — Telephone Encounter (Signed)
Dee/[facility] called pt is having infusion today, orders did not include Tylenol 650mg  po under the premeds. Please refax [phone]

## 2017-01-05 NOTE — Telephone Encounter (Signed)
I have added this to the orders and re faxed to [facility].

## 2017-01-06 ENCOUNTER — Ambulatory Visit (HOSPITAL_COMMUNITY): Payer: Medicaid Other

## 2017-01-11 NOTE — Telephone Encounter (Signed)
Pt said she called [facility]  to r/s infusion appt and was transferred to [facility]. Pt said she called 2 times before and was transferred to the cancer center. Pt said she has called [phone] and [phone]. Please call to advise

## 2017-01-11 NOTE — Telephone Encounter (Signed)
Rn call Dee at [phone]. Rn stated pt was calling around tried to find infusion number at [facility]. Geraldine Contras stated she just off the phone with patient. Pt miss her infusion, and needed to reschedule. Pt is r/s per [facility] at short stay. Pt has been spoken to about the ocrevus infusion as of today.Note closed.

## 2017-02-08 ENCOUNTER — Other Ambulatory Visit: Payer: Self-pay

## 2017-02-08 ENCOUNTER — Encounter (HOSPITAL_COMMUNITY): Payer: Self-pay | Admitting: *Deleted

## 2017-02-08 ENCOUNTER — Emergency Department (HOSPITAL_COMMUNITY)
Admission: EM | Admit: 2017-02-08 | Discharge: 2017-02-09 | Disposition: A | Discharge status: Home / Self Care | Payer: Medicaid Other | Attending: Emergency Medicine | Admitting: Emergency Medicine

## 2017-02-08 ENCOUNTER — Emergency Department (HOSPITAL_COMMUNITY): Payer: Medicaid Other

## 2017-02-08 DIAGNOSIS — W19XXXA Unspecified fall, initial encounter: Secondary | ICD-10-CM

## 2017-02-08 DIAGNOSIS — Y929 Unspecified place or not applicable: Secondary | ICD-10-CM | POA: Diagnosis not present

## 2017-02-08 DIAGNOSIS — S62650A Nondisplaced fracture of medial phalanx of right index finger, initial encounter for closed fracture: Secondary | ICD-10-CM | POA: Insufficient documentation

## 2017-02-08 DIAGNOSIS — Z91040 Latex allergy status: Secondary | ICD-10-CM | POA: Diagnosis not present

## 2017-02-08 DIAGNOSIS — Z87891 Personal history of nicotine dependence: Secondary | ICD-10-CM | POA: Insufficient documentation

## 2017-02-08 DIAGNOSIS — Z79899 Other long term (current) drug therapy: Secondary | ICD-10-CM | POA: Insufficient documentation

## 2017-02-08 DIAGNOSIS — G35 Multiple sclerosis: Secondary | ICD-10-CM | POA: Insufficient documentation

## 2017-02-08 DIAGNOSIS — Y939 Activity, unspecified: Secondary | ICD-10-CM | POA: Diagnosis not present

## 2017-02-08 DIAGNOSIS — Y998 Other external cause status: Secondary | ICD-10-CM | POA: Insufficient documentation

## 2017-02-08 DIAGNOSIS — W000XXA Fall on same level due to ice and snow, initial encounter: Secondary | ICD-10-CM | POA: Diagnosis not present

## 2017-02-08 DIAGNOSIS — M25511 Pain in right shoulder: Secondary | ICD-10-CM | POA: Diagnosis present

## 2017-02-08 DIAGNOSIS — E119 Type 2 diabetes mellitus without complications: Secondary | ICD-10-CM | POA: Insufficient documentation

## 2017-02-08 DIAGNOSIS — S93401A Sprain of unspecified ligament of right ankle, initial encounter: Secondary | ICD-10-CM | POA: Insufficient documentation

## 2017-02-08 HISTORY — DX: Major depressive disorder, single episode, unspecified: F32.9

## 2017-02-08 HISTORY — DX: Depression, unspecified: F32.A

## 2017-02-08 NOTE — ED Triage Notes (Signed)
Pt fell 2 days ago. She says that she fell on some ice in her yard. She said she "felt my ankle crush", right shoulder pain and right second finger. No meds PTA. Ambulatory to triage.

## 2017-02-09 MED ORDER — NAPROXEN 250 MG PO TABS: ORAL_TABLET | ORAL | 0 refills | Status: DC

## 2017-02-09 NOTE — ED Provider Notes (Signed)
[facility] EMERGENCY DEPARTMENT Provider Note   CSN: [id number] Arrival date & time: 02/08/17  2125  Time seen 23:50 PM   History   Chief Complaint Chief Complaint  Patient presents with  . Fall    HPI Brooke [name] is a 31 y.o. female.  HPI patient states she was outside in her yard and she slipped on some ice on December 17.  She states she fell forward onto her knees and her hands.  She slipped again trying to get up and landed on her right upper arm.  She has pain now in her lateral right ankle, her shoulder, and her right index finger.  She denies hitting her head or having loss of consciousness.  Patient is right-handed.  Patient takes tramadol on a regular basis for "pain all over".  PCP Fleet ContrasAvbuere, Edwin, MD Ortho  none  Past Medical History:  Diagnosis Date  . Anxiety   . Chronic back pain   . Depression   . Diabetes mellitus without complication (HCC)   . Edema   . MS (multiple sclerosis) (HCC) 09/29/2014    Patient Active Problem List   Diagnosis Date Noted  . Acute appendicitis 05/12/2015  . MS (multiple sclerosis) (HCC) 09/29/2014  . Positive ANA (antinuclear antibody) 07/02/2014  . Abnormal MRI of head 04/22/2014  . Transient diplopia 04/22/2014  . 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 Spell of memory loss 04/22/2014  . Recurrent falls while walking 04/22/2014    Past Surgical History:  Procedure Laterality Date  . APPENDECTOMY    . CHOLECYSTECTOMY N/A 10/07/2013   Procedure: LAPAROSCOPIC CHOLECYSTECTOMY;  Surgeon: Dalia HeadingMark A Jenkins, MD;  Location: [facility];  Service: General;  Laterality: N/A;  . LAPAROSCOPIC APPENDECTOMY N/A 05/12/2015   Procedure: APPENDECTOMY LAPAROSCOPIC;  Surgeon: Franky MachoMark Jenkins, MD;  Location: [facility];  Service: General;  Laterality: N/A;  . WISDOM TOOTH EXTRACTION      OB History    Gravida Para Term Preterm AB Living   2       2 0   SAB TAB Ectopic Multiple Live Births   1   1           Home Medications    Prior to Admission medications    Medication Sig Start Date End Date Taking? Authorizing Provider  ALPRAZolam Prudy Feeler(XANAX) 1 MG tablet Take 1 tablet (1 mg total) by mouth at bedtime as needed for anxiety. 08/02/16   Butch PennyMillikan, Megan, NP  ferrous sulfate 325 (65 FE) MG tablet Take 325 mg by mouth every morning.    [provider]  gabapentin (NEURONTIN) 300 MG capsule TAKE ONE CAPSULE BY MOUTH TWICE A DAY AFTER LUNCH, AND AT BEDTIME 07/20/16   Dohmeier, Porfirio Mylararmen, MD  Multiple Vitamin (MULTIVITAMIN) tablet Take 1 tablet by mouth daily.    [provider]  naproxen (NAPROSYN) 250 MG tablet Take 1 po BID with food prn pain 02/09/17   Devoria AlbeKnapp, Tadashi Burkel, MD  ocrelizumab (OCREVUS) 300 MG/10ML injection Ocrelizumab 300mg  IV on day one, repeat 300mg  IV on day 15.  Then, every 6 mos. infuse 600mg  IV. 05/19/16   Dohmeier, Porfirio Mylararmen, MD  PROVIGIL 200 MG tablet Take 1 tablet (200 mg total) by mouth daily. 12/12/16   Dohmeier, Porfirio Mylararmen, MD  traMADol (ULTRAM) 50 MG tablet Take 1 tablet (50 mg total) by mouth every 6 (six) hours as needed for severe pain. 08/02/16   Butch PennyMillikan, Megan, NP    Family History Family History  Problem Relation Age of Onset  . Diabetes Mother   .  Colon polyps Mother        hx of cancer  . Other Mother        DDD Lumber, cervical  . Sleep apnea Father     Social History Social History   Tobacco Use  . Smoking status: Former Smoker    Last attempt to quit: 09/16/2007    Years since quitting: 9.4  . Smokeless tobacco: Never Used  Substance Use Topics  . Alcohol use: No    Alcohol/week: 0.0 oz  . Drug use: No    Comment: no drug use since 2010  Applying for disability for her MS   Allergies   Lexapro [escitalopram oxalate] and Latex   Review of Systems Review of Systems  All other systems reviewed and are negative.    Physical Exam Updated Vital Signs BP 134/77 (BP Location: Right Arm)   Pulse 89   Temp 98.5 F (36.9 C) (Oral)   Resp 17   Ht 5\' 3"  (1.6 m)   Wt (!) 149.7 kg (330 lb)   LMP  01/29/2017   SpO2 100%   BMI 58.46 kg/m   Physical Exam  Constitutional: She is oriented to person, place, and time. She appears well-developed and well-nourished. No distress.  Sleeping on her stomach  HENT:  Head: Normocephalic and atraumatic.  Right Ear: External ear normal.  Left Ear: External ear normal.  Nose: Nose normal.  Mouth/Throat: Oropharynx is clear and moist.  Eyes: Conjunctivae and EOM are normal. Pupils are equal, round, and reactive to light.  Neck: Normal range of motion.  Cardiovascular: Normal rate.  Pulmonary/Chest: Effort normal. No respiratory distress.  Musculoskeletal: She exhibits edema and tenderness. She exhibits no deformity.  Has diffuse swelling over the lateral malleolus of her right ankle that is tender to palpation. Nontender medial ankle and no swelling.  Has mild diffuse swelling over her RIF and seems most tender over the PIPJ. Has painful ROM. Nontender over the MCPJ.  Has pain on abduction of her right shoulder, nontender clavicle. Has diffuse tenderness in her right shoulder. Good distal pulses.   Neurological: She is alert and oriented to person, place, and time. No cranial nerve deficit.  Skin: Skin is warm and dry. No erythema. No pallor.  Psychiatric: She has a normal mood and affect. Her behavior is normal. Thought content normal.  Nursing note and vitals reviewed.    ED Treatments / Results  Labs (all labs ordered are listed, but only abnormal results are displayed) Labs Reviewed - No data to display  EKG  EKG Interpretation None       Radiology Dg Shoulder Right  Result Date: 02/08/2017 CLINICAL DATA:  Fall with pain and swelling EXAM: RIGHT SHOULDER - 2+ VIEW COMPARISON:  None. FINDINGS: There is no evidence of fracture or dislocation. There is no evidence of arthropathy or other focal bone abnormality. Soft tissues are unremarkable. IMPRESSION: Negative. Electronically Signed   By: Jasmine Pang M.D.   On: 02/08/2017 22:09    Dg Ankle Complete Right  Result Date: 02/08/2017 CLINICAL DATA:  Fall with pain and swelling EXAM: RIGHT ANKLE - COMPLETE 3+ VIEW COMPARISON:  08/29/2003 FINDINGS: Lateral soft tissue swelling. Ankle mortise is symmetric. Punctate densities near the inferior tip of the lateral malleolus and the medial malleolus. IMPRESSION: 1. Soft tissue swelling.  No definite acute osseous abnormality 2. Punctate opacities adjacent to the medial and lateral malleolus may reflect old avulsion injuries or tiny loose bodies Electronically Signed   By: Selena Batten  Jake Samples M.D.   On: 02/08/2017 22:11   Dg Finger Index Right  Result Date: 02/08/2017 CLINICAL DATA:  Fall with pain and swelling EXAM: RIGHT INDEX FINGER 2+V COMPARISON:  None. FINDINGS: Small step-off deformity at the volar cortex, base of second middle phalanx. No subluxation. No radiopaque foreign body IMPRESSION: Small cortical step-off deformity volar base of the second middle phalanx, correlate clinically for focal tenderness to this region Electronically Signed   By: Jasmine Pang M.D.   On: 02/08/2017 22:09    Procedures Procedures (including critical care time)  Medications Ordered in ED Medications - No data to display   Initial Impression / Assessment and Plan / ED Course  I have reviewed the triage vital signs and the nursing notes.  Pertinent labs & imaging results that were available during my care of the patient were reviewed by me and considered in my medical decision making (see chart for details).     Patient was placed in a finger splint and an ASO, I did not put her on crutches because of her shoulder and finger injury I did not think she can manage them.  She is to keep it elevated use ice, she should wear the ASO and the finger splint until she is rechecked by the orthopedist on-call, Dr. Romeo Apple.  She should call his office to get a follow-up appointment in the next week or 2.  We discussed hopefully her ankle is a sprain and  should be healed by then, she however may have a rotator cuff injury of her shoulder and she has the fracture that is nondisplaced of her finger that needs follow-up.  Final Clinical Impressions(s) / ED Diagnoses   Final diagnoses:  Fall, initial encounter  Sprain of right ankle, unspecified ligament, initial encounter  Closed nondisplaced fracture of middle phalanx of right index finger, initial encounter  Acute pain of right shoulder    ED Discharge Orders        Ordered    naproxen (NAPROSYN) 250 MG tablet     02/09/17 0027     Plan discharge  Devoria Albe, MD, Concha Pyo, MD 02/09/17 [phone]

## 2017-02-09 NOTE — Discharge Instructions (Signed)
Use ice for comfort. Wear the finger splint until you are rechecked by the orthopedist on call, Dr Romeo Apple, call his office to get an appointment. Wear the ankle support. You can take the naproxen for pain. You already have tramadol to take for pain also. You may have a rotator cuff injury to your shoulder, Dr Romeo Apple can check you for that when you see him.

## 2017-02-22 ENCOUNTER — Encounter: Payer: Self-pay | Admitting: Orthopaedic Surgery

## 2017-02-22 ENCOUNTER — Ambulatory Visit: Payer: Medicaid Other | Admitting: Orthopaedic Surgery

## 2017-03-01 ENCOUNTER — Ambulatory Visit (HOSPITAL_COMMUNITY): Payer: Medicaid Other

## 2017-03-02 ENCOUNTER — Ambulatory Visit (INDEPENDENT_AMBULATORY_CARE_PROVIDER_SITE_OTHER): Payer: Medicaid Other

## 2017-03-02 ENCOUNTER — Ambulatory Visit: Payer: Medicaid Other | Admitting: Orthopaedic Surgery

## 2017-03-02 ENCOUNTER — Encounter: Payer: Self-pay | Admitting: Orthopaedic Surgery

## 2017-03-02 VITALS — BP 124/84 | HR 81 | Ht 64.0 in | Wt 317.0 lb

## 2017-03-02 DIAGNOSIS — M25531 Pain in right wrist: Secondary | ICD-10-CM

## 2017-03-02 DIAGNOSIS — S62650A Nondisplaced fracture of medial phalanx of right index finger, initial encounter for closed fracture: Secondary | ICD-10-CM | POA: Diagnosis not present

## 2017-03-02 DIAGNOSIS — M25511 Pain in right shoulder: Secondary | ICD-10-CM

## 2017-03-02 MED ORDER — NAPROXEN 500 MG PO TABS: 500.0000 mg | ORAL_TABLET | Freq: Two times a day (BID) | ORAL | 5 refills | Status: DC
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
## 2017-03-08 ENCOUNTER — Encounter (HOSPITAL_COMMUNITY): Payer: Self-pay

## 2017-03-08 ENCOUNTER — Encounter (HOSPITAL_COMMUNITY)
Admission: RE | Admit: 2017-03-08 | Discharge: 2017-03-08 | Disposition: A | Discharge status: Home / Self Care | Payer: Medicaid Other | Source: Ambulatory Visit | Attending: Neurology | Admitting: Neurology

## 2017-03-08 DIAGNOSIS — G35 Multiple sclerosis: Secondary | ICD-10-CM | POA: Diagnosis not present

## 2017-03-08 MED ORDER — ACETAMINOPHEN 325 MG PO TABS
650.0000 mg | ORAL_TABLET | Freq: Once | ORAL | Status: AC
Start: 2017-03-08 — End: 2017-03-08
  Administered 2017-03-08: 650 mg via ORAL
  Filled 2017-03-08: qty 2

## 2017-03-08 MED ORDER — SODIUM CHLORIDE 0.9 % IV SOLN
500.0000 mg | Freq: Once | INTRAVENOUS | Status: AC
  Administered 2017-03-08: 500 mg via INTRAVENOUS
  Filled 2017-03-08: qty 4

## 2017-03-08 MED ORDER — SODIUM CHLORIDE 0.9 % IV SOLN
600.0000 mg | Freq: Once | INTRAVENOUS | Status: AC
  Administered 2017-03-08: 600 mg via INTRAVENOUS
  Filled 2017-03-08 (×2): qty 20

## 2017-03-08 MED ORDER — SODIUM CHLORIDE 0.9 % IV SOLN
Freq: Once | INTRAVENOUS | Status: AC
  Administered 2017-03-08: 08:00:00 via INTRAVENOUS

## 2017-03-08 MED ORDER — DIPHENHYDRAMINE HCL 25 MG PO CAPS
50.0000 mg | ORAL_CAPSULE | Freq: Once | ORAL | Status: AC
Start: 2017-03-08 — End: 2017-03-08
  Administered 2017-03-08: 50 mg via ORAL
  Filled 2017-03-08: qty 2

## 2017-03-08 NOTE — Discharge Instructions (Signed)
Ocrelizumab injection What is this medicine? OCRELIZUMAB (ok re LIZ ue mab) treats multiple sclerosis. It helps to decrease the number of multiple sclerosis relapses. It is not a cure. This medicine may be used for other purposes; ask your health care provider or pharmacist if you have questions. COMMON BRAND NAME(S): OCREVUS What should I tell my health care provider before I take this medicine? They need to know if you have any of these conditions: -cancer -hepatitis B infection -other infection (especially a virus infection such as chickenpox, cold sores, or herpes) -an unusual or allergic reaction to ocrelizumab, other medicines, foods, dyes or preservatives -pregnant or trying to get pregnant -breast-feeding How should I use this medicine? This medicine is for infusion into a vein. It is given by a health care professional in a hospital or clinic setting. Talk to your pediatrician regarding the use of this medicine in children. Special care may be needed. Overdosage: If you think you have taken too much of this medicine contact a poison control center or emergency room at once. NOTE: This medicine is only for you. Do not share this medicine with others. What if I miss a dose? Keep appointments for follow-up doses as directed. It is important not to miss your dose. Call your doctor or health care professional if you are unable to keep an appointment. What may interact with this medicine? -alemtuzumab -daclizumab -dimethyl fumarate -fingolimod -glatiramer -interferon beta -live virus vaccines -mitoxantrone -natalizumab -peginterferon beta -rituximab -steroid medicines like prednisone or cortisone -teriflunomide This list may not describe all possible interactions. Give your health care provider a list of all the medicines, herbs, non-prescription drugs, or dietary supplements you use. Also tell them if you smoke, drink alcohol, or use illegal drugs. Some items may interact with  your medicine. What should I watch for while using this medicine? Tell your doctor or healthcare professional if your symptoms do not start to get better or if they get worse. This medicine can cause serious allergic reactions. To reduce your risk you may need to take medicine before treatment with this medicine. Take your medicine as directed. Women should inform their doctor if they wish to become pregnant or think they might be pregnant. There is a potential for serious side effects to an unborn child. Talk to your health care professional or pharmacist for more information. Female patients should use effective birth control methods while receiving this medicine and for 6 months after the last dose. Call your doctor or health care professional for advice if you get a fever, chills or sore throat, or other symptoms of a cold or flu. Do not treat yourself. This drug decreases your body's ability to fight infections. Try to avoid being around people who are sick. If you have a hepatitis B infection or a history of a hepatitis B infection, talk to your doctor. The symptoms of hepatitis B may get worse if you take this medicine. In some patients, this medicine may cause a serious brain infection that may cause death. If you have any problems seeing, thinking, speaking, walking, or standing, tell your doctor right away. If you cannot reach your doctor, urgently seek other source of medical care. This medicine can decrease the response to a vaccine. If you need to get vaccinated, tell your healthcare professional if you have received this medicine. Extra booster doses may be needed. Talk to your doctor to see if a different vaccination schedule is needed. Talk to your doctor about your risk of cancer.   You may be more at risk for certain types of cancers if you take this medicine. What side effects may I notice from receiving this medicine? Side effects that you should report to your doctor or health care  professional as soon as possible: -allergic reactions like skin rash, itching or hives, swelling of the face, lips, or tongue -breathing problems -facial flushing -fast, irregular heartbeat -lump or soreness in the breast -signs and symptoms of herpes such as cold sore, shingles, or genital sores -signs and symptoms of infection like fever or chills, cough, sore throat, pain or trouble passing urine -signs and symptoms of low blood pressure like dizziness; feeling faint or lightheaded, falls; unusually weak or tired -signs and symptoms of progressive multifocal leukoencephalopathy (PML) like changes in vision; clumsiness; confusion; personality changes; weakness on one side of the body -swelling of the ankles, feet, hands Side effects that usually do not require medical attention (report these to your doctor or health care professional if they continue or are bothersome): -back pain -depressed mood -diarrhea -pain, redness, or irritation at site where injected This list may not describe all possible side effects. Call your doctor for medical advice about side effects. You may report side effects to FDA at 1-800-FDA-1088. Where should I keep my medicine? This drug is given in a hospital or clinic and will not be stored at home. NOTE: This sheet is a summary. It may not cover all possible information. If you have questions about this medicine, talk to your doctor, pharmacist, or health care provider.  2018 Elsevier/Gold Standard (2015-05-26 09:40:25)  

## 2017-03-23 ENCOUNTER — Other Ambulatory Visit: Payer: Self-pay | Admitting: Neurology

## 2017-03-23 ENCOUNTER — Encounter: Payer: Self-pay | Admitting: Orthopaedic Surgery

## 2017-03-23 ENCOUNTER — Ambulatory Visit: Payer: Medicaid Other | Admitting: Orthopaedic Surgery

## 2017-03-23 VITALS — BP 126/68 | HR 83 | Temp 97.3°F | Ht 64.0 in | Wt 334.0 lb

## 2017-03-23 DIAGNOSIS — G35 Multiple sclerosis: Secondary | ICD-10-CM

## 2017-03-23 DIAGNOSIS — S62650A Nondisplaced fracture of medial phalanx of right index finger, initial encounter for closed fracture: Secondary | ICD-10-CM | POA: Diagnosis not present

## 2017-03-23 DIAGNOSIS — M25511 Pain in right shoulder: Secondary | ICD-10-CM

## 2017-03-23 DIAGNOSIS — R296 Repeated falls: Secondary | ICD-10-CM

## 2017-03-23 NOTE — Progress Notes (Signed)
Patient ZO:XWRUEA L Germer, female DOB:[birth year], 32 y.o. VWU:[id number]  Chief Complaint  Patient presents with  . Shoulder Pain    right     HPI  Markisha L Sultana is a 32 y.o. female who has volar plate fracture of the right index finger.  It is better.  She has little pain and no swelling now.  The right shoulder is what is hurting her now.  She had an injury of it and it got better but over the last week it is painful, hurts to raise her hand over head and hurts rolling on it at night.  She has no new trauma, no paresthesias.  Nothing seems to help it.  Tylenol and Advil have not helped and same for ice and heat. HPI  Body mass index is 57.33 kg/m.  ROS  Review of Systems  HENT: Negative for congestion.   Respiratory: Negative for cough and shortness of breath.   Cardiovascular: Negative for chest pain and leg swelling.  Endocrine: Positive for cold intolerance.  Musculoskeletal: Positive for arthralgias and back pain.  Allergic/Immunologic: Positive for environmental allergies.  Psychiatric/Behavioral: The patient is nervous/anxious.   All other systems reviewed and are negative.   Past Medical History:  Diagnosis Date  . Anxiety   . Chronic back pain   . Depression   . Diabetes mellitus without complication (HCC)   . Edema   . MS (multiple sclerosis) (HCC) 09/29/2014    Past Surgical History:  Procedure Laterality Date  . APPENDECTOMY    . CHOLECYSTECTOMY N/A 10/07/2013   Procedure: LAPAROSCOPIC CHOLECYSTECTOMY;  Surgeon: Dalia Heading, MD;  Location: [facility];  Service: General;  Laterality: N/A;  . LAPAROSCOPIC APPENDECTOMY N/A 05/12/2015   Procedure: APPENDECTOMY LAPAROSCOPIC;  Surgeon: Franky Macho, MD;  Location: [facility];  Service: General;  Laterality: N/A;  . WISDOM TOOTH EXTRACTION      Family History  Problem Relation Age of Onset  . Diabetes Mother   . Colon polyps Mother        hx of cancer  . Other Mother        DDD Lumber, cervical  . Sleep  apnea Father     Social History Social History   Tobacco Use  . Smoking status: Former Smoker    Last attempt to quit: 09/16/2007    Years since quitting: 9.5  . Smokeless tobacco: Never Used  Substance Use Topics  . Alcohol use: No    Alcohol/week: 0.0 oz  . Drug use: No    Comment: no drug use since 2010    Allergies  Allergen Reactions  . Lexapro [Escitalopram Oxalate] Other (See Comments)    Suicidal thoughts, lowered libido  . Latex Hives    Current Outpatient Medications  Medication Sig Dispense Refill  . ALPRAZolam (XANAX) 1 MG tablet Take 1 tablet (1 mg total) by mouth at bedtime as needed for anxiety. 15 tablet 0  . cetirizine (ZYRTEC) 10 MG tablet Take 10 mg by mouth daily as needed.  5  . ferrous sulfate 325 (65 FE) MG tablet Take 325 mg by mouth every morning.    . gabapentin (NEURONTIN) 300 MG capsule TAKE ONE CAPSULE BY MOUTH TWICE A DAY AFTER LUNCH, AND AT BEDTIME 60 capsule 5  . Multiple Vitamin (MULTIVITAMIN) tablet Take 1 tablet by mouth daily.    . naproxen (NAPROSYN) 500 MG tablet Take 1 tablet (500 mg total) by mouth 2 (two) times daily with a meal. 60 tablet 5  . ocrelizumab (  OCREVUS) 300 MG/10ML injection Ocrelizumab 300mg  IV on day one, repeat 300mg  IV on day 15.  Then, every 6 mos. infuse 600mg  IV. 2 each 1  . phentermine (ADIPEX-P) 37.5 MG tablet Take 37.5 mg by mouth every morning.  2  . PROVIGIL 200 MG tablet Take 1 tablet (200 mg total) by mouth daily. 30 tablet 5  . tiZANidine (ZANAFLEX) 4 MG tablet Take 4 mg by mouth 2 (two) times daily as needed.  2  . traMADol (ULTRAM) 50 MG tablet Take 1 tablet (50 mg total) by mouth every 6 (six) hours as needed for severe pain. 30 tablet 0   No current facility-administered medications for this visit.      Physical Exam  Blood pressure 126/68, pulse 83, temperature (!) 97.3 F (36.3 C), height 5\' 4"  (1.626 m), weight (!) 334 lb (151.5 kg), last menstrual period 03/02/2017.  Constitutional: overall  normal hygiene, normal nutrition, well developed, normal grooming, normal body habitus. Assistive device:none  Musculoskeletal: gait and station Limp none, muscle tone and strength are normal, no tremors or atrophy is present.  .  Neurological: coordination overall normal.  Deep tendon reflex/nerve stretch intact.  Sensation normal.  Cranial nerves II-XII intact.   Skin:   Normal overall no scars, lesions, ulcers or rashes. No psoriasis.  Psychiatric: Alert and oriented x 3.  Recent memory intact, remote memory unclear.  Normal mood and affect. Well groomed.  Good eye contact.  Cardiovascular: overall no swelling, no varicosities, no edema bilaterally, normal temperatures of the legs and arms, no clubbing, cyanosis and good capillary refill.  Lymphatic: palpation is normal.  The right index finger has full ROM and no pain.  Examination of right Upper Extremity is done.  Inspection:   Overall:  Elbow non-tender without crepitus or defects, forearm non-tender without crepitus or defects, wrist non-tender without crepitus or defects, hand non-tender.    Shoulder: with glenohumeral joint tenderness, without effusion.   Upper arm: without swelling and tenderness   Range of motion:   Overall:  Full range of motion of the elbow, full range of motion of wrist and full range of motion in fingers.   Shoulder:  right  165 degrees forward flexion; 150 degrees abduction; 35 degrees internal rotation, 35 degrees external rotation, 15 degrees extension, 40 degrees adduction.   Stability:   Overall:  Shoulder, elbow and wrist stable   Strength and Tone:   Overall full shoulder muscles strength, full upper arm strength and normal upper arm bulk and tone.  All other systems reviewed and are negative   The patient has been educated about the nature of the problem(s) and counseled on treatment options.  The patient appeared to understand what I have discussed and is in agreement with it.  Encounter  Diagnoses  Name Primary?  . Acute pain of right shoulder Yes  . Closed nondisplaced fracture of middle phalanx of right index finger, initial encounter    PROCEDURE NOTE:  The patient request injection, verbal consent was obtained.  The right shoulder was prepped appropriately after time out was performed.   Sterile technique was observed and injection of 1 cc of Depo-Medrol 40 mg with several cc's of plain xylocaine. Anesthesia was provided by ethyl chloride and a 20-gauge needle was used to inject the shoulder area. A posterior approach was used.  The injection was tolerated well.  A band aid dressing was applied.  The patient was advised to apply ice later today and tomorrow to the injection sight  as needed.    PLAN Call if any problems.  Precautions discussed.  Continue current medications.   Return to clinic 2 weeks   Electronically Signed Darreld Mclean, MD 1/31/20192:47 PM

## 2017-04-06 ENCOUNTER — Encounter: Payer: Self-pay | Admitting: Orthopaedic Surgery

## 2017-04-06 ENCOUNTER — Ambulatory Visit: Payer: Medicaid Other | Admitting: Orthopaedic Surgery

## 2017-04-06 VITALS — BP 118/70 | HR 82 | Ht 64.0 in | Wt 330.0 lb

## 2017-04-06 DIAGNOSIS — M25511 Pain in right shoulder: Secondary | ICD-10-CM

## 2017-04-06 NOTE — Progress Notes (Signed)
CC:  My shoulder is much better  She has little pain of the right shoulder today.  The injection last time really helped.  She has no numbness.  ROM is full.  NV intact.  Encounter Diagnosis  Name Primary?  . Acute pain of right shoulder Yes   I will see her as needed.  Call if any problem.  Precautions discussed.   Electronically Signed Darreld Mclean, MD 2/14/20199:18 AM

## 2017-04-24 ENCOUNTER — Telehealth: Payer: Self-pay | Admitting: Neurology

## 2017-04-24 ENCOUNTER — Encounter: Payer: Self-pay | Admitting: Neurology

## 2017-04-24 ENCOUNTER — Ambulatory Visit: Payer: Medicaid Other | Admitting: Neurology

## 2017-04-24 VITALS — BP 122/72 | HR 68 | Ht 64.0 in | Wt 335.0 lb

## 2017-04-24 DIAGNOSIS — G35 Multiple sclerosis: Secondary | ICD-10-CM | POA: Diagnosis not present

## 2017-04-24 DIAGNOSIS — R159 Full incontinence of feces: Secondary | ICD-10-CM | POA: Diagnosis not present

## 2017-04-24 DIAGNOSIS — R32 Unspecified urinary incontinence: Secondary | ICD-10-CM | POA: Diagnosis not present

## 2017-04-24 DIAGNOSIS — R296 Repeated falls: Secondary | ICD-10-CM

## 2017-04-24 DIAGNOSIS — D649 Anemia, unspecified: Secondary | ICD-10-CM | POA: Diagnosis not present

## 2017-04-24 MED ORDER — CALCIUM CITRATE-VITAMIN D 250-100 MG-UNIT PO TABS: 1.0000 | ORAL_TABLET | Freq: Two times a day (BID) | ORAL | 5 refills | Status: DC

## 2017-04-24 MED ORDER — PROVIGIL 200 MG PO TABS: 200.0000 mg | ORAL_TABLET | Freq: Every day | ORAL | 5 refills | Status: DC

## 2017-04-24 MED ORDER — GABAPENTIN 300 MG PO CAPS: 600.0000 mg | ORAL_CAPSULE | Freq: Two times a day (BID) | ORAL | 5 refills | Status: DC

## 2017-04-24 MED ORDER — FERROUS SULFATE 325 (65 FE) MG PO TABS: 325.0000 mg | ORAL_TABLET | Freq: Every morning | ORAL | 3 refills | Status: DC

## 2017-04-24 MED ORDER — OCRELIZUMAB 300 MG/10ML IV SOLN: INTRAVENOUS | 2 refills | Status: DC

## 2017-04-24 NOTE — Telephone Encounter (Signed)
PA submitted via the phone with  Medicaid [place] cover tracks [phone]. PA for Ocrevus approved from now until 04/19/2018.

## 2017-04-24 NOTE — Progress Notes (Signed)
PATIENT: Brooke [name] DOB: Nov 19, 1985  REASON FOR VISIT: follow up- multiple sclerosis HISTORY FROM: patient  HISTORY OF PRESENT ILLNESS: Interval history from 24 April 2017.  Brooke [name] is seen here today, her mother passed away in 2018/10/31and I see her now unaccompanied.  She now lives with her sister.  Throughout the year 2018 she did not have any new lesions related to multiple sclerosis and continued her OCREVUS previous therapy which she has tolerated very well. Today is a routine visit for this 32 year old patient.  She endorsed the Epworth sleepiness score at 11 points, fatigue severity very high at 63 points, she is very depressed and grieving. She is seen by a counselor every 3 weeks Karin Golden ) , at [facility]. She has not been using Modafinil, denies she ever got it.  Has started on phentermine for weight loss- and is ataxic, has had falls.    Interval history from 12/05/2016. Brooke [name] has sadly reported that her mother ( 32) is deadly ill, and that she is currently on life support at [facility]. She is understandably depressed and sad. In relation to her multiple sclerosis condition she feels that create was has been able to stall the development of new symptoms or relapses, but that she feels drained, has loss of energy, she feels weak and week her over the course of the last months. She would like to shorten the interval. In which revisit from 6 months to 3 months, I will also provide a medication that helps her with energy, namely modafinil. If modafinil is denied I would be happy to give her out of old just to make sure that she can function during daytime.   Today 08/02/2016 Brooke [name] is a 32 year old female with a history of multiple sclerosis. She returns today for follow-up. She is now on Ocrevus and had had two doses. She reports that she has tolerated medication well. She reports that ishe continues to have weakness in the left leg. At  times she feels that it is worse. Reports that she will occasionally drag the left leg when ambulating. Denies any significant changes with her gait or balance. She still states that at times she will stagger. She does report blurry vision particularly when she reads her drops. Reports that she used to wear glasses but has not been able to follow-up with her ophthalmologist. She does note that she has experienced some bowel incontinence. She states that she had an episode last night and her that it was approximately 1 month ago. The patient had repeat MRI of the brain, cervical spine and thoracic spine in March that did not show any active lesions. Patient returns today for an evaluation.  HISTORY3-07-2016, Brooke [name] is seen for a 6 month interval visit. She received a phone call in autumn of last year stating that she has tested negative for hepatitis and for tuberculosis and that she could be a candidate for OCREVUS- We had eliminated Tysabri from her list as possible medication due to a positive JC virus panel. Brooke [name] them had briefly dropped out of Medicaid, and could not make a new appointment in our office for the last 3 month until the issue was settled. Due to this problem with insurance coverage she was unable to obtain an MRI of the brain and C-spine is recommended for February of this year and I will have to reorder it today.    REVIEW OF SYSTEMS: Out of a  complete 14 system review of symptoms, the patient complains only of the following symptoms, and all other reviewed systems are negative.  Depression, weakness, depression. Activity change, hearing loss, blurred vision, shortness of breath, excessive thirst, back pain, walking, tremor, weakness, speech difficulty, confusion, decreased concentration, nervous/changes, bruise/bleed easily.  Ataxic gait , fragmented turns, with the right foot being weaker. She reports bladder incontinence. She has to pull the let leg into her car with both  hands.   ALLERGIES: Allergies  Allergen Reactions  . Lexapro [Escitalopram Oxalate] Other (See Comments)    Suicidal thoughts, lowered libido  . Latex Hives    HOME MEDICATIONS: Outpatient Medications Prior to Visit  Medication Sig Dispense Refill  . ALPRAZolam (XANAX) 1 MG tablet Take 1 tablet (1 mg total) by mouth at bedtime as needed for anxiety. 15 tablet 0  . cetirizine (ZYRTEC) 10 MG tablet Take 10 mg by mouth daily as needed.  5  . ferrous sulfate 325 (65 FE) MG tablet Take 325 mg by mouth every morning.    . gabapentin (NEURONTIN) 300 MG capsule TAKE ONE CAPSULE BY MOUTH TWICE A DAY AFTER LUNCH, AND AT BEDTIME 60 capsule 5  . Multiple Vitamin (MULTIVITAMIN) tablet Take 1 tablet by mouth daily.    . naproxen (NAPROSYN) 500 MG tablet Take 1 tablet (500 mg total) by mouth 2 (two) times daily with a meal. 60 tablet 5  . ocrelizumab (OCREVUS) 300 MG/10ML injection Ocrelizumab 300mg  IV on day one, repeat 300mg  IV on day 15.  Then, every 6 mos. infuse 600mg  IV. 2 each 1  . phentermine (ADIPEX-P) 37.5 MG tablet Take 37.5 mg by mouth every morning.  2  . tiZANidine (ZANAFLEX) 4 MG tablet Take 4 mg by mouth 2 (two) times daily as needed.  2  . traMADol (ULTRAM) 50 MG tablet Take 1 tablet (50 mg total) by mouth every 6 (six) hours as needed for severe pain. 30 tablet 0  . PROVIGIL 200 MG tablet Take 1 tablet (200 mg total) by mouth daily. (Patient not taking: Reported on 04/24/2017) 30 tablet 5   No facility-administered medications prior to visit.     PAST MEDICAL HISTORY: Past Medical History:  Diagnosis Date  . Anxiety   . Chronic back pain   . Depression   . Diabetes mellitus without complication (HCC)   . Edema   . Brooke (multiple sclerosis) (HCC) 09/29/2014    PAST SURGICAL HISTORY: Past Surgical History:  Procedure Laterality Date  . APPENDECTOMY    . CHOLECYSTECTOMY N/A 10/07/2013   Procedure: LAPAROSCOPIC CHOLECYSTECTOMY;  Surgeon: Dalia Heading, MD;  Location: [facility];   Service: General;  Laterality: N/A;  . LAPAROSCOPIC APPENDECTOMY N/A 05/12/2015   Procedure: APPENDECTOMY LAPAROSCOPIC;  Surgeon: Franky Macho, MD;  Location: [facility];  Service: General;  Laterality: N/A;  . WISDOM TOOTH EXTRACTION      FAMILY HISTORY: Family History  Problem Relation Age of Onset  . Diabetes Mother   . Colon polyps Mother        hx of cancer  . Other Mother        DDD Lumber, cervical  . Sleep apnea Father     SOCIAL HISTORY: Social History   Socioeconomic History  . Marital status: Single    Spouse name: Not on file  . Number of children: Not on file  . Years of education: 2 y colleg  . Highest education level: Not on file  Social Needs  . Financial resource strain: Not  on file  . Food insecurity - worry: Not on file  . Food insecurity - inability: Not on file  . Transportation needs - medical: Not on file  . Transportation needs - non-medical: Not on file  Occupational History  . Occupation: work  Tobacco Use  . Smoking status: Former Smoker    Last attempt to quit: 09/16/2007    Years since quitting: 9.6  . Smokeless tobacco: Never Used  Substance and Sexual Activity  . Alcohol use: No    Alcohol/week: 0.0 oz  . Drug use: No    Comment: no drug use since 2010  . Sexual activity: No    Birth control/protection: None  Other Topics Concern  . Not on file  Social History Narrative   Drinks 1-2 large glasses of caffeine daily.         PHYSICAL EXAM  Vitals:   04/24/17 0917  BP: 122/72  Pulse: 68  Weight: (!) 335 lb (152 kg)  Height: 5\' 4"  (1.626 m)   Body mass index is 57.5 kg/m.  Generalized: Well developed, in no acute distress, obese- and on phentermine.   Neurological examination  Mentation: Alert oriented to time, place, history taking. Follows all commands speech and language fluent Cranial nerve :  No change in taste or smell. Pupils were equal round reactive to light.  Extraocular movements were full, visual field were full  on confrontational test. Facial sensation and strength were normal. Uvula and tongue move midline.  Head turning , ROM and shoulder shrug were symmetric. Motor:  She has not had normal strength for over one year-  her left leg adduction and hip flexion is weaker than right, her right foot is dropped.  Sensory:  Numbness in both legs  Coordination: deferred.  Gait and station: Ataxic gait , fragmented turns, with the right foot being weaker. She reports bladder incontinence. She has to pull the let leg into her car with both hands.  Gait is very wide based- Romberg - drift is not seen.  Reflexes:  Left patella much more brisk than right, right foot drop with absent achilles tendon reflex.  DIAGNOSTIC DATA (LABS, IMAGING, TESTING) - I reviewed patient records, labs, notes, testing and imaging myself where available.  Last Ocrevus infusion in January 2019 at the hospital.   Lab Results  Component Value Date   WBC 4.3 08/02/2016   HGB 12.4 08/02/2016   HCT 34.2 08/02/2016   MCV 81 08/02/2016   PLT 255 08/02/2016      Component Value Date/Time   NA 139 08/02/2016 1132   K 4.7 08/02/2016 1132   CL 102 08/02/2016 1132   CO2 23 08/02/2016 1132   GLUCOSE 91 08/02/2016 1132   GLUCOSE 123 (H) 05/12/2015 1651   BUN 5 (L) 08/02/2016 1132   CREATININE 0.65 08/02/2016 1132   CALCIUM 9.1 08/02/2016 1132   PROT 6.5 08/02/2016 1132   ALBUMIN 3.8 08/02/2016 1132   AST 12 08/02/2016 1132   ALT 11 08/02/2016 1132   ALKPHOS 57 08/02/2016 1132   BILITOT 0.4 08/02/2016 1132   GFRNONAA 121 08/02/2016 1132   GFRAA 140 08/02/2016 1132      ASSESSMENT AND PLAN 32 y.o. year old female  has a past medical history of Anxiety, Chronic back pain, Depression, Diabetes mellitus without complication (HCC), Edema, and Brooke (multiple sclerosis) (HCC) (09/29/2014). here with:  1. Multiple sclerosis, remitting - on Ocrevus.  2. 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 3. Fatigue is related to  depression and Brooke, she had not received modafinil yet- I refilled for once a day use.  4.  Morbid obesity - Dr. Kizzie Furnish follows.  5. Anemia and Vit D levels low in history - check with next blood test, in the meantime start a prenatal vitamin which contains B complex and Vit D and iron.  She should take about 2000 I U daily of Vit D.   The patient's physical exam is relatively remarkable for gait instability,( Falls !!!)  tandem gait was very unsteady and turns fragmented. SHe needs to see PT.  Weight loss will help. [name] She will continue on Ocrevus. I will check blood work today.  Patient has had a few episodes of bowel and bladder  incontinence.    This of course could be due to multiple sclerosis - She will follow-up in 3 months or sooner if needed with NP [place].  I spent 35 minutes with the patient. 50% of this time was spent discussing symptoms related to multiple sclerosis. She continues with OCREVUS, and has not had relapses on the medication, but feels fatigued. She feels heavy and weak, and we started some Modafinil- Plan B is  Adderall.    Melvyn Novas, MD   04/24/2017, 9:52 AM [facility] [address] [place], [place] [address] [phone]

## 2017-04-24 NOTE — Patient Instructions (Signed)
ASSESSMENT AND PLAN  Dear Dr. Kizzie Furnish . Our mutual patient ,Brooke [name],  is a 32 y.o. year old widowed female MS patient :  1. Multiple sclerosis, remitting - continue on Ocrevus.  2. 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 Fatigue is related to depression and MS, she had not received modafinil      yet- I refilled for once a day use.  I increased Gabapentin for Numbness. Tingling in both legs.  4.  Morbid obesity - Dr. Kizzie Furnish follows.  5. Anemia and Vit D levels low in history - check with next blood test, in the meantime start a prenatal vitamin which contains B complex and Vit D/ calcium - and iron.  She should take about 2000 I U daily of Vit D.   Your  physical exam is relatively remarkable for gait instability,( Falls !!!)  tandem gait was very unsteady and  Your turns are fragmented ( more than 3 steps )n. You need to see PT. I referred you to [facility].   Weight loss will help with gait !!!.  You  will continue on Ocrevus. I will check blood work today.    Your episodes of bowel and bladder incontinence were not explained by spinal cord lesions. ( MRI reviewed) .  Melvyn Novas, MD

## 2017-04-25 ENCOUNTER — Telehealth: Payer: Self-pay | Admitting: Neurology

## 2017-04-25 NOTE — Telephone Encounter (Signed)
Called the pt and went over normal lab work. Pt verbalized understanding and had no questions.

## 2017-04-25 NOTE — Telephone Encounter (Signed)
-----   Message from Melvyn Novas, MD sent at 04/25/2017  8:43 AM EST ----- Normal CMET- and CBC = continue OCREVUS

## 2017-04-29 LAB — COMPREHENSIVE METABOLIC PANEL
ALBUMIN: 3.8 g/dL (ref 3.5–5.5)
ALT: 14 IU/L (ref 0–32)
AST: 17 IU/L (ref 0–40)
Albumin/Globulin Ratio: 1.1 — ABNORMAL LOW (ref 1.2–2.2)
Alkaline Phosphatase: 70 IU/L (ref 39–117)
BUN / CREAT RATIO: 16 (ref 9–23)
BUN: 12 mg/dL (ref 6–20)
Bilirubin Total: 0.3 mg/dL (ref 0.0–1.2)
CO2: 20 mmol/L (ref 20–29)
CREATININE: 0.77 mg/dL (ref 0.57–1.00)
Calcium: 9.3 mg/dL (ref 8.7–10.2)
Chloride: 101 mmol/L (ref 96–106)
GFR calc non Af Amer: 105 mL/min/{1.73_m2} (ref >59)
GFR, EST AFRICAN AMERICAN: 121 mL/min/{1.73_m2} (ref >59)
GLUCOSE: 99 mg/dL (ref 65–99)
Globulin, Total: 3.4 g/dL (ref 1.5–4.5)
Potassium: 4.8 mmol/L (ref 3.5–5.2)
Sodium: 138 mmol/L (ref 134–144)
TOTAL PROTEIN: 7.2 g/dL (ref 6.0–8.5)

## 2017-04-29 LAB — CBC WITH DIFFERENTIAL/PLATELET
Basophils Absolute: 0 10*3/uL (ref 0.0–0.2)
Basos: 0 %
EOS (ABSOLUTE): 0.1 10*3/uL (ref 0.0–0.4)
Eos: 1 %
Hematocrit: 35.5 % (ref 34.0–46.6)
Hemoglobin: 12.7 g/dL (ref 11.1–15.9)
Immature Grans (Abs): 0 10*3/uL (ref 0.0–0.1)
Immature Granulocytes: 0 %
LYMPHS ABS: 1.4 10*3/uL (ref 0.7–3.1)
Lymphs: 25 %
MCH: 29.1 pg (ref 26.6–33.0)
MCHC: 35.8 g/dL — AB (ref 31.5–35.7)
MCV: 81 fL (ref 79–97)
MONOS ABS: 0.4 10*3/uL (ref 0.1–0.9)
Monocytes: 7 %
NEUTROS ABS: 3.8 10*3/uL (ref 1.4–7.0)
Neutrophils: 67 %
Platelets: 295 10*3/uL (ref 150–379)
RBC: 4.37 x10E6/uL (ref 3.77–5.28)
RDW: 14.6 % (ref 12.3–15.4)
WBC: 5.6 10*3/uL (ref 3.4–10.8)

## 2017-04-29 LAB — IRON AND TIBC
IRON SATURATION: 16 % (ref 15–55)
Iron: 44 ug/dL (ref 27–159)
Total Iron Binding Capacity: 276 ug/dL (ref 250–450)
UIBC: 232 ug/dL (ref 131–425)

## 2017-04-29 LAB — VITAMIN D 1,25 DIHYDROXY
VITAMIN D 1, 25 (OH) TOTAL: 42 pg/mL
VITAMIN D3 1, 25 (OH): 38 pg/mL
Vitamin D2 1, 25 (OH)2: <10 pg/mL

## 2017-05-02 ENCOUNTER — Telehealth: Payer: Self-pay | Admitting: Neurology

## 2017-05-02 NOTE — Telephone Encounter (Signed)
-----   Message from Melvyn Novas, MD sent at 05/01/2017  4:02 PM EDT ----- Borderline low iron saturation, recommend oral supplement OTC.

## 2017-05-02 NOTE — Telephone Encounter (Signed)
Made pt aware. The pt is already on a iron supplement. Encouraged to continue. Pt verbalized understanding.

## 2017-05-08 ENCOUNTER — Telehealth (HOSPITAL_COMMUNITY): Payer: Self-pay

## 2017-05-08 ENCOUNTER — Encounter (HOSPITAL_COMMUNITY): Payer: Self-pay

## 2017-05-08 ENCOUNTER — Ambulatory Visit (HOSPITAL_COMMUNITY): Payer: Medicaid Other | Attending: Neurology

## 2017-05-08 ENCOUNTER — Other Ambulatory Visit: Payer: Self-pay

## 2017-05-08 DIAGNOSIS — M6281 Muscle weakness (generalized): Secondary | ICD-10-CM | POA: Insufficient documentation

## 2017-05-08 DIAGNOSIS — R2689 Other abnormalities of gait and mobility: Secondary | ICD-10-CM | POA: Diagnosis present

## 2017-05-08 DIAGNOSIS — Z9181 History of falling: Secondary | ICD-10-CM | POA: Diagnosis present

## 2017-05-08 DIAGNOSIS — R29818 Other symptoms and signs involving the nervous system: Secondary | ICD-10-CM | POA: Insufficient documentation

## 2017-05-08 NOTE — Therapy (Signed)
[facility] [address] [place], [place], [address] Phone: [phone]   Fax:  [phone]  Physical Therapy Evaluation  Patient Details  Name: Brooke [name] MRN: [id number] Date of Birth: 15-Sep-1985 Referring Provider: Melvyn Novas, MD   Encounter Date: 05/08/2017  PT End of Session - 05/08/17 1040    Visit Number  1    Number of Visits  4    Authorization Type  Medicaid; request sent 05/08/17 (initial 3)    Authorization Time Period  Cert: 6/96/29 - 06/19/17    PT Start Time  1041 patient arrived late    PT Stop Time  1116    PT Time Calculation (min)  35 min    Equipment Utilized During Treatment  Gait belt    Activity Tolerance  Patient tolerated treatment well;Patient limited by fatigue    Behavior During Therapy  [facility] for tasks assessed/performed       Past Medical History:  Diagnosis Date  . Anxiety   . Chronic back pain   . Depression   . Diabetes mellitus without complication (HCC)   . Edema   . MS (multiple sclerosis) (HCC) 09/29/2014    Past Surgical History:  Procedure Laterality Date  . APPENDECTOMY    . CHOLECYSTECTOMY N/A 10/07/2013   Procedure: LAPAROSCOPIC CHOLECYSTECTOMY;  Surgeon: Dalia Heading, MD;  Location: [facility];  Service: General;  Laterality: N/A;  . LAPAROSCOPIC APPENDECTOMY N/A 05/12/2015   Procedure: APPENDECTOMY LAPAROSCOPIC;  Surgeon: Franky Macho, MD;  Location: [facility];  Service: General;  Laterality: N/A;  . WISDOM TOOTH EXTRACTION      There were no vitals filed for this visit.   Subjective Assessment - 05/08/17 1837    Subjective  Patient reports she was diagnosed with MS approximately 3 years ago. She states she is always tired and depressed. She is currently seeking treatment for her Depression at [facility] in [place] and states she has an appointment tonight. She reports a significant history of emotional trauma, her mother recently passed and she has moved in with her sister,  and her husband was murder. She appears sad but does not breakdown. She reports 3 falls in the last 6 months and states she has never used a cane or RW. She does not own any DME or adaptive equipment. She reports her sister does most of the shopping, cooking, and cleaning but that she is able to dress and bathe herself. She reports when she is fatigued her vision becomes blurry, but reports it always is unclear. She is trying to obtain insurance coverage for her glasses but is having difficulty doing so. She is also in the process of applying for disability. She would like to improve her balance and strength.    Pertinent History  diagnosed with MS in 2016,     Limitations  Reading;Standing;Walking;House hold activities    How long can you sit comfortably?  unsure    How long can you stand comfortably?  unsure    How long can you walk comfortably?  unsure    Patient Stated Goals  decrease falls an improve activity tolerance    Currently in Pain?  No/denies         [facility] PT Assessment - 05/08/17 0001      Assessment   Medical Diagnosis  Gait/Balance secondary to Multiple Sclerosis    Referring Provider  Dohmeier, Porfirio Mylar, MD    Onset Date/Surgical Date  05/09/14 approximatelty 3 years ago  Next MD Visit  3 months    Prior Therapy  no      Precautions   Precautions  None      Restrictions   Weight Bearing Restrictions  No      Balance Screen   Has the patient fallen in the past 6 months  Yes    How many times?  3    Has the patient had a decrease in activity level because of a fear of falling?   Yes    Is the patient reluctant to leave their home because of a fear of falling?   No      Home Environment   Living Environment  Private residence    Living Arrangements  Other relatives    Available Help at Discharge  Family sister    Type of Home  House    Home Access  Stairs to enter    Entrance Stairs-Number of Steps  2    Entrance Stairs-Rails  Cannot reach both they are wobbly,  told Landlord to fix    Home Layout  One level    Home Equipment  None    Additional Comments  tub shouwer combo, gets in and out is tough, needs a lot fo rest breaks, get bathed and dress i but does not do the shopping or cleaning      Prior Function   Level of Independence  Independent;Independent with basic ADLs sister does teh cleaning and shopping primarily    Vocation  On disability      Cognition   Overall Cognitive Status  Within Functional Limits for tasks assessed      Observation/Other Assessments   Other Surveys   Other Surveys    Activities of Balance Confidence Scale (ABC Scale)   Perform next session      Sensation   Additional Comments  Patient reports impaired sensation of bil feet secondary to M.S.      Functional Tests   Functional tests  Single leg stance      Single Leg Stance   Comments  Rt LE = 5 seconds ; Lt LE = 5 seconds      ROM / Strength   AROM / PROM / Strength  Strength      Strength   Strength Assessment Site  Hip;Knee;Ankle    Right Hip Flexion  4-/5    Right Hip Extension  4/5    Right Hip ABduction  4/5    Left Hip Flexion  3+/5    Left Hip Extension  4/5    Left Hip ABduction  4/5    Right/Left Knee  Right;Left    Right Knee Flexion  4+/5    Right Knee Extension  4+/5    Left Knee Flexion  4+/5    Left Knee Extension  4+/5    Right Ankle Dorsiflexion  4+/5    Right Ankle Plantar Flexion  -- test next session    Left Ankle Dorsiflexion  4+/5    Left Ankle Plantar Flexion  -- test next session      Transfers   Five time sit to stand comments   12.68, no UE use      Standardized Balance Assessment   Standardized Balance Assessment  Dynamic Gait Index      Dynamic Gait Index   Level Surface  Mild Impairment    Change in Gait Speed  Mild Impairment    Gait with Horizontal Head Turns  Moderate Impairment  Gait with Vertical Head Turns  Moderate Impairment    Gait and Pivot Turn  Moderate Impairment    Step Over Obstacle  Mild  Impairment    Step Around Obstacles  Mild Impairment    Steps  Moderate Impairment    Total Score  12    DGI comment:  12/21 indicates she is at risk for falling during functional/dynamic gait activities         Objective measurements completed on examination: See above findings.     PT Education - 05/08/17 1905    Education provided  Yes    Education Details  Educated on appropriate POC and medicaid authorization process. Educated on exam findings.     Person(s) Educated  Patient    Methods  Explanation    Comprehension  Verbalized understanding       PT Short Term Goals - 05/08/17 1852      PT SHORT TERM GOAL #1   Title  Pt will be independent in HEP to improve the strength, endurance, and balance.    Time  2    Period  Weeks    Status  New    Target Date  05/22/17      PT SHORT TERM GOAL #2   Title  Patient will improve MMT by 1/2 grade for limited groups to improve muscle endurance during gait and functional mobility.     Time  3    Period  Weeks    Status  New      PT SHORT TERM GOAL #3   Title  Patient will ambulate with LRAD at gait velocity of 0.8 m/s durign 2 MWT, to reduce risk of falling and indicate she is at a safe community ambulation cateogry to improve community access.     Time  3    Period  Weeks    Status  New        PT Long Term Goals - 05/08/17 1855      PT LONG TERM GOAL #1   Title  Pt will verbalize understanding of fall risk strategies and energy conservation strategies to reduce falls risk and to conserve energy.    Time  6    Period  Weeks    Status  New    Target Date  06/19/17      PT LONG TERM GOAL #2   Title  Patient will improve MMT by 1 grade for limited groups to improve muscle endurance during gait and functional mobility to ascend/descend stairs with step over step pattern.     Time  6    Period  Weeks    Status  New      PT LONG TERM GOAL #3   Title  Patient will improve DGI score by 5 points to demonstrate significant  improvement of dynamic balance during gait activities and decrease fall risk durign daily activities.     Time  6    Period  Weeks    Status  New        Plan - 05/08/17 1841    Clinical Impression Statement  Ms. Klemp presents for initial physical therapy evaluation due to repeated falls and decreased balance/mobility secondary to her multiple sclerosis. She presents with muscle weakness, poor activity tolerance, decreased balance, repeated falls, impaired sensation, visual impairments, and decreased performance with ADL's. Objective testing revealed a score of 12/21 on the DGI indicating she is at risk for falls and she was unable to maintain SLS for greater  than 5 seconds on Bi lLE. Her activity tolerance is low and she reported an increase form 5/10 to 7/10 in fatigue level following the DGI. She will benefit from skilled PT services to address current impairments and provide education  on activity/lifestyle modifications and energy conservation to improve function and QOL.    History and Personal Factors relevant to plan of care:  diagnosed with MD ~ 2016; pre-diabetic;     Clinical Presentation  Stable    Clinical Presentation due to:  muscle weakness, poor activity tolerance, decreased balance, repeated falls, impaired sensation, visual impairments, and decreased performance with ADL's; Clinical Judgement    Clinical Decision Making  Low    Rehab Potential  Good    PT Frequency  2x / week    PT Duration  6 weeks    PT Treatment/Interventions  ADLs/Self Care Home Management;Aquatic Therapy;Gait training;Stair training;Functional mobility training;Therapeutic activities;Therapeutic exercise;Balance training;Neuromuscular re-education;Patient/family education;Manual techniques;Passive range of motion;Energy conservation;Taping    PT Next Visit Plan  Review eval and goals. Discuss daily activities and fatigue ranking to develop an energy conservation plan. Use BORG RPE and fatigue scale 0-10  throughout session. Initiate balance training to improve SLS. (initiate HEP next session: SLS at counter, tandem at counter)    Consulted and Agree with Plan of Care  Patient       Patient will benefit from skilled therapeutic intervention in order to improve the following deficits and impairments:  Abnormal gait, Decreased activity tolerance, Decreased endurance, Decreased strength, Improper body mechanics, Decreased balance, Decreased mobility, Difficulty walking, Postural dysfunction, Obesity, Pain, Impaired sensation  Visit Diagnosis: Other abnormalities of gait and mobility  Muscle weakness (generalized)  History of falling  Other symptoms and signs involving the nervous system     Problem List Patient Active Problem List   Diagnosis Date Noted  . Acute appendicitis 05/12/2015  . MS (multiple sclerosis) (HCC) 09/29/2014  . Positive ANA (antinuclear antibody) 07/02/2014  . Abnormal MRI of head 04/22/2014  . Transient diplopia 04/22/2014  . 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 Spell of memory loss 04/22/2014  . Recurrent falls while walking 04/22/2014    Valentino Saxon, PT, DPT Physical Therapist with [facility]  05/08/2017 7:05 PM    [facility] [address] [place], [place], [address] Phone: [phone]   Fax:  [phone]  Name: JELISSA ESPIRITU MRN: [id number] Date of Birth: [birth year]

## 2017-05-08 NOTE — Telephone Encounter (Signed)
Pt understand Medicaid benefits NF 05/08/2017

## 2017-05-08 NOTE — Telephone Encounter (Signed)
Pt on her way she went to the [facility]. NF 10:34 am

## 2017-05-10 ENCOUNTER — Encounter (HOSPITAL_COMMUNITY): Payer: Self-pay | Admitting: Physical Therapy

## 2017-05-10 ENCOUNTER — Ambulatory Visit (HOSPITAL_COMMUNITY): Payer: Medicaid Other | Admitting: Physical Therapy

## 2017-05-10 DIAGNOSIS — R2689 Other abnormalities of gait and mobility: Secondary | ICD-10-CM | POA: Diagnosis not present

## 2017-05-10 DIAGNOSIS — R29818 Other symptoms and signs involving the nervous system: Secondary | ICD-10-CM

## 2017-05-10 DIAGNOSIS — Z9181 History of falling: Secondary | ICD-10-CM

## 2017-05-10 DIAGNOSIS — M6281 Muscle weakness (generalized): Secondary | ICD-10-CM

## 2017-05-10 NOTE — Therapy (Signed)
[facility] [address] [place], [place], [address] Phone: [phone]   Fax:  [phone]  Physical Therapy Treatment  Patient Details  Name: Brooke [name] MRN: [id number] Date of Birth: June 06, 1985 Referring Provider: Melvyn Novas, MD   Encounter Date: 05/10/2017  PT End of Session - 05/10/17 1226    Visit Number  2    Number of Visits  4    Authorization Type  Medicaid; request sent 05/08/17 (initial 3)    Authorization Time Period  Cert: 6/96/29 - 06/19/17    PT Start Time  1120 Patient arrived late    PT Stop Time  1205 Some time unbilled due to taking assessment measures    PT Time Calculation (min)  45 min    Equipment Utilized During Treatment  Gait belt    Activity Tolerance  Patient tolerated treatment well;Patient limited by fatigue    Behavior During Therapy  [facility] for tasks assessed/performed       Past Medical History:  Diagnosis Date  . Anxiety   . Chronic back pain   . Depression   . Diabetes mellitus without complication (HCC)   . Edema   . MS (multiple sclerosis) (HCC) 09/29/2014    Past Surgical History:  Procedure Laterality Date  . APPENDECTOMY    . CHOLECYSTECTOMY N/A 10/07/2013   Procedure: LAPAROSCOPIC CHOLECYSTECTOMY;  Surgeon: Dalia Heading, MD;  Location: [facility];  Service: General;  Laterality: N/A;  . LAPAROSCOPIC APPENDECTOMY N/A 05/12/2015   Procedure: APPENDECTOMY LAPAROSCOPIC;  Surgeon: Franky Macho, MD;  Location: [facility];  Service: General;  Laterality: N/A;  . WISDOM TOOTH EXTRACTION      There were no vitals filed for this visit.  Subjective Assessment - 05/10/17 1123    Subjective  Patient stated that she is having back pain along her whole back today, mostly in the middle.     Pertinent History  diagnosed with MS in 2016,     Limitations  Reading;Standing;Walking;House hold activities    How long can you sit comfortably?  unsure    How long can you stand comfortably?  unsure    How long can you walk comfortably?  unsure    Patient Stated Goals  decrease falls an improve activity tolerance    Currently in Pain?  Yes    Pain Score  5     Pain Location  Back    Pain Orientation  Mid    Pain Descriptors / Indicators  Tightness    Pain Type  Chronic pain    Pain Onset  More than a month ago    Pain Frequency  Intermittent    Aggravating Factors   Laying down    Pain Relieving Factors  Muscle relaxers    Multiple Pain Sites  No         [facility] PT Assessment - 05/10/17 0001      Observation/Other Assessments   Activities of Balance Confidence Scale (ABC Scale)   560 score/16 = 35      Strength   Right Hip Flexion  4-/5    Right Hip Extension  4/5    Right Hip ABduction  4/5    Left Hip Flexion  3+/5    Left Hip Extension  4/5    Left Hip ABduction  4/5    Right Knee Flexion  4+/5    Right Knee Extension  4+/5    Left Knee Flexion  4+/5    Left Knee  Extension  4+/5    Right Ankle Dorsiflexion  4+/5    Right Ankle Plantar Flexion  3-/5    Left Ankle Dorsiflexion  4+/5    Left Ankle Plantar Flexion  3-/5      Ambulation/Gait   Ambulation Distance (Feet)  258 Feet    Assistive device  None    Gait Pattern  Step-through pattern;Decreased arm swing - right;Decreased arm swing - left;Decreased step length - right;Decreased step length - left;Decreased stride length;Decreased hip/knee flexion - right;Decreased hip/knee flexion - left    Ambulation Surface  Level    Gait velocity  0.66 m/s                  [facility] Adult PT Treatment/Exercise - 05/10/17 0001      Knee/Hip Exercises: Standing   Functional Squat  10 reps;Other (comment) Standing at parallel bars using bars for support          Balance Exercises - 05/10/17 1228      Balance Exercises: Standing   Tandem Stance  Eyes open;Intermittent upper extremity support;Other reps (comment) 10 repetitions inside parallel bars each lower extremity    SLS  Eyes open;Solid surface;Intermittent  upper extremity support;Other reps (comment) 15 repetitions each lower extremity inside parallel bars    Tandem Gait  Forward;Intermittent upper extremity support;4 reps;Limitations Min guarding and intermittent min assistance. 14 feet x 4    Sidestepping  4 reps;Other (comment);Theraband With red theraband around ankles 14 feet x 4; 2 x each         PT Education - 05/10/17 1225    Education provided  Yes    Education Details  Patient was educated on home exercise program and purpose and technique of exercises throughout session.     Person(s) Educated  Patient    Methods  Explanation;Demonstration;Tactile cues;Verbal cues;Handout    Comprehension  Verbalized understanding;Returned demonstration;Verbal cues required;Tactile cues required;Need further instruction       PT Short Term Goals - 05/08/17 1852      PT SHORT TERM GOAL #1   Title  Pt will be independent in HEP to improve the strength, endurance, and balance.    Time  2    Period  Weeks    Status  New    Target Date  05/22/17      PT SHORT TERM GOAL #2   Title  Patient will improve MMT by 1/2 grade for limited groups to improve muscle endurance during gait and functional mobility.     Time  3    Period  Weeks    Status  New      PT SHORT TERM GOAL #3   Title  Patient will ambulate with LRAD at gait velocity of 0.8 m/s durign 2 MWT, to reduce risk of falling and indicate she is at a safe community ambulation cateogry to improve community access.     Time  3    Period  Weeks    Status  New        PT Long Term Goals - 05/08/17 1855      PT LONG TERM GOAL #1   Title  Pt will verbalize understanding of fall risk strategies and energy conservation strategies to reduce falls risk and to conserve energy.    Time  6    Period  Weeks    Status  New    Target Date  06/19/17      PT LONG TERM GOAL #2   Title  Patient will improve MMT by 1 grade for limited groups to improve muscle endurance during gait and functional  mobility to ascend/descend stairs with step over step pattern.     Time  6    Period  Weeks    Status  New      PT LONG TERM GOAL #3   Title  Patient will improve DGI score by 5 points to demonstrate significant improvement of dynamic balance during gait activities and decrease fall risk durign daily activities.     Time  6    Period  Weeks    Status  New            Plan - 05/10/17 1232    Clinical Impression Statement  This session initiated with a review of patient's evaluation and goals. Then therapist performed some assessment measures that were unable to be completed at the initial evaluation. Session then progressed to patient performing standing balance activities. Patient performed single limb stance and tandem stance inside of the parallel bars. Patient required intermittent upper extremity support throughout. Then patient performed tandem gait. Patient required minimal guarding throughout and minimal assistance occasional to maintain balance and intermittent hand hold assist. Therapist educated patient on the 6-20 RPE scale and asked patient what her level of exertion was throughout session. Patient reported a 7 throughout. Therapist provided patient with home exercises and discussed them with patient. Patient would benefit from continued skilled physical therapy as she continued to demonstrate deficits in balance, ambulation, strength, and overall functional mobility.     Rehab Potential  Good    PT Frequency  2x / week    PT Duration  6 weeks    PT Treatment/Interventions  ADLs/Self Care Home Management;Aquatic Therapy;Gait training;Stair training;Functional mobility training;Therapeutic activities;Therapeutic exercise;Balance training;Neuromuscular re-education;Patient/family education;Manual techniques;Passive range of motion;Energy conservation;Taping    PT Next Visit Plan   Discuss daily activities and fatigue ranking to develop an energy conservation plan. Use BORG RPE and  fatigue scale 0-10 throughout session. Initiate balance training to improve SLS.    PT Home Exercise Plan  05/10/17: Single leg stance 15x at counter each leg 1x/day; Tandem stance x15 each leg forward at counter 1 x/day    Consulted and Agree with Plan of Care  Patient       Patient will benefit from skilled therapeutic intervention in order to improve the following deficits and impairments:  Abnormal gait, Decreased activity tolerance, Decreased endurance, Decreased strength, Improper body mechanics, Decreased balance, Decreased mobility, Difficulty walking, Postural dysfunction, Obesity, Pain, Impaired sensation  Visit Diagnosis: Other abnormalities of gait and mobility  History of falling  Muscle weakness (generalized)  Other symptoms and signs involving the nervous system     Problem List Patient Active Problem List   Diagnosis Date Noted  . Acute appendicitis 05/12/2015  . MS (multiple sclerosis) (HCC) 09/29/2014  . Positive ANA (antinuclear antibody) 07/02/2014  . Abnormal MRI of head 04/22/2014  . Transient diplopia 04/22/2014  . 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 Spell of memory loss 04/22/2014  . Recurrent falls while walking 04/22/2014   Verne Carrow PT, DPT 12:39 PM, 05/10/17 [phone]  [facility] [address] [place], [place], [address] Phone: [phone]   Fax:  [phone]  Name: Brooke [name] MRN: [id number] Date of Birth: 1985-08-21

## 2017-05-10 NOTE — Patient Instructions (Signed)
  TANDEM STANCE WITH SUPPORT Stand in front of a counter top for support. Then place the heel of one foot so that it is touching the toes of the other foot. Maintain your balance in this position. Use counter for support as needed. 15 repetitions each lower extremity forward. Try to hold for as long as possible for up to a Minute. Repeat 15 Times Complete 1 Set Perform 1 Time(s) a Day   SINGLE LEG STANCE - SLS Stand on one leg and maintain your balance. Use counter for support as needed. Perform 15 repetitions with each lower extremity. Hold as long as able up to 1 minute.  Repeat 15 Times Complete 1 Set Perform 1 Time(s) a Day

## 2017-05-16 ENCOUNTER — Ambulatory Visit (HOSPITAL_COMMUNITY): Payer: Medicaid Other

## 2017-05-16 ENCOUNTER — Encounter (HOSPITAL_COMMUNITY): Payer: Self-pay

## 2017-05-16 DIAGNOSIS — R29818 Other symptoms and signs involving the nervous system: Secondary | ICD-10-CM

## 2017-05-16 DIAGNOSIS — R2689 Other abnormalities of gait and mobility: Secondary | ICD-10-CM | POA: Diagnosis not present

## 2017-05-16 DIAGNOSIS — Z9181 History of falling: Secondary | ICD-10-CM

## 2017-05-16 DIAGNOSIS — M6281 Muscle weakness (generalized): Secondary | ICD-10-CM

## 2017-05-16 NOTE — Therapy (Signed)
[facility] [address] [place], [place], [address] Phone: [phone]   Fax:  [phone]  Physical Therapy Treatment  Patient Details  Name: Brooke [name] MRN: [id number] Date of Birth: 1986-01-03 Referring Provider: Melvyn Novas, MD   Encounter Date: 05/16/2017  PT End of Session - 05/16/17 1047    Visit Number  3    Number of Visits  4    Date for PT Re-Evaluation  06/19/17 request following 4th session    Authorization Type  Medicaid; request sent 05/08/17 (initial 3)    Authorization Time Period  Cert: 6/96/29 - 06/19/17    PT Start Time  1036    PT Stop Time  1118    PT Time Calculation (min)  42 min    Equipment Utilized During Treatment  Gait belt    Activity Tolerance  Patient tolerated treatment well;Patient limited by fatigue    Behavior During Therapy  [facility] for tasks assessed/performed       Past Medical History:  Diagnosis Date  . Anxiety   . Chronic back pain   . Depression   . Diabetes mellitus without complication (HCC)   . Edema   . MS (multiple sclerosis) (HCC) 09/29/2014    Past Surgical History:  Procedure Laterality Date  . APPENDECTOMY    . CHOLECYSTECTOMY N/A 10/07/2013   Procedure: LAPAROSCOPIC CHOLECYSTECTOMY;  Surgeon: Dalia Heading, MD;  Location: [facility];  Service: General;  Laterality: N/A;  . LAPAROSCOPIC APPENDECTOMY N/A 05/12/2015   Procedure: APPENDECTOMY LAPAROSCOPIC;  Surgeon: Franky Macho, MD;  Location: [facility];  Service: General;  Laterality: N/A;  . WISDOM TOOTH EXTRACTION      There were no vitals filed for this visit.  Subjective Assessment - 05/16/17 1041    Subjective  Pt stated she continues to have LOB episodes, havent fallen in 5 days.  Current pain scale 5/10 Lt hip    Patient Stated Goals  decrease falls an improve activity tolerance    Currently in Pain?  Yes    Pain Score  5     Pain Location  Hip    Pain Orientation  Left    Pain Descriptors / Indicators  Aching  with certain movements    Pain Type  Chronic pain    Pain Onset  More than a month ago    Pain Frequency  Intermittent    Aggravating Factors   Laying down    Pain Relieving Factors  muscle relaxers                No data recorded            Balance Exercises - 05/16/17 1100      Balance Exercises: Standing   Standing Eyes Opened  Narrow base of support (BOS);Foam/compliant surface UE flexion and punch forward with 2# dowel rob NBOS on foam    Tandem Stance  Eyes open;Foam/compliant surface;3 reps;15 secs    SLS  Eyes open;Solid surface;Intermittent upper extremity support;Other reps (comment) Rt 11", Lt 6" max of 5    SLS with Vectors  2 reps 2x 5" intermittent HHA    Sidestepping  2 reps;Theraband    Turning  -- RTB    Marching Limitations  10x 3-5" with intermittent HHA    Other Standing Exercises  cone tapping; squats 10 with HHA and STS 10x          PT Short Term Goals - 05/08/17 1852  PT SHORT TERM GOAL #1   Title  Pt will be independent in HEP to improve the strength, endurance, and balance.    Time  2    Period  Weeks    Status  New    Target Date  05/22/17      PT SHORT TERM GOAL #2   Title  Patient will improve MMT by 1/2 grade for limited groups to improve muscle endurance during gait and functional mobility.     Time  3    Period  Weeks    Status  New      PT SHORT TERM GOAL #3   Title  Patient will ambulate with LRAD at gait velocity of 0.8 m/s durign 2 MWT, to reduce risk of falling and indicate she is at a safe community ambulation cateogry to improve community access.     Time  3    Period  Weeks    Status  New        PT Long Term Goals - 05/08/17 1855      PT LONG TERM GOAL #1   Title  Pt will verbalize understanding of fall risk strategies and energy conservation strategies to reduce falls risk and to conserve energy.    Time  6    Period  Weeks    Status  New    Target Date  06/19/17      PT LONG TERM GOAL #2    Title  Patient will improve MMT by 1 grade for limited groups to improve muscle endurance during gait and functional mobility to ascend/descend stairs with step over step pattern.     Time  6    Period  Weeks    Status  New      PT LONG TERM GOAL #3   Title  Patient will improve DGI score by 5 points to demonstrate significant improvement of dynamic balance during gait activities and decrease fall risk durign daily activities.     Time  6    Period  Weeks    Status  New            Plan - 05/16/17 1221    Clinical Impression Statement  Session focus with balance training.  Added SLS activities including marching, cone tapping and vector stance for proximal strengthening with prolonged holds.  Pt required moderate cueing to improve spatial awareness and posture to assist with balance activities.  Exertion levels monitored through session average scale 6-7 through session.  No reoprts of increased pain through session.      Rehab Potential  Good    PT Frequency  2x / week    PT Duration  6 weeks    PT Treatment/Interventions  ADLs/Self Care Home Management;Aquatic Therapy;Gait training;Stair training;Functional mobility training;Therapeutic activities;Therapeutic exercise;Balance training;Neuromuscular re-education;Patient/family education;Manual techniques;Passive range of motion;Energy conservation;Taping    PT Next Visit Plan   Discuss daily activities and fatigue ranking to develop an energy conservation plan. Use BORG RPE and fatigue scale 0-10 throughout session. Initiate balance training to improve SLS.    PT Home Exercise Plan  05/10/17: Single leg stance 15x at counter each leg 1x/day; Tandem stance x15 each leg forward at counter 1 x/day       Patient will benefit from skilled therapeutic intervention in order to improve the following deficits and impairments:  Abnormal gait, Decreased activity tolerance, Decreased endurance, Decreased strength, Improper body mechanics, Decreased  balance, Decreased mobility, Difficulty walking, Postural dysfunction, Obesity, Pain, Impaired sensation  Visit Diagnosis: Other abnormalities of gait and mobility  History of falling  Muscle weakness (generalized)  Other symptoms and signs involving the nervous system     Problem List Patient Active Problem List   Diagnosis Date Noted  . Acute appendicitis 05/12/2015  . MS (multiple sclerosis) (HCC) 09/29/2014  . Positive ANA (antinuclear antibody) 07/02/2014  . Abnormal MRI of head 04/22/2014  . Transient diplopia 04/22/2014  . 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 Spell of memory loss 04/22/2014  . Recurrent falls while walking 04/22/2014   Becky Sax, LPTA; CBIS [phone]  Juel Burrow 05/16/2017, 12:27 PM  [facility] [address] [place], [place], [address] Phone: [phone]   Fax:  [phone]  Name: CHEYANNA STRICK MRN: [id number] Date of Birth: 07/20/1985

## 2017-05-17 ENCOUNTER — Ambulatory Visit (HOSPITAL_COMMUNITY): Payer: Medicaid Other

## 2017-05-17 ENCOUNTER — Telehealth (HOSPITAL_COMMUNITY): Payer: Self-pay

## 2017-05-17 NOTE — Telephone Encounter (Signed)
No show, called and left message concerning missed apt today.  Included next apt date and contact information given.    [address] LPTA; CBIS [phone]

## 2017-05-23 ENCOUNTER — Other Ambulatory Visit: Payer: Self-pay

## 2017-05-23 ENCOUNTER — Ambulatory Visit (HOSPITAL_COMMUNITY): Payer: Medicaid Other | Attending: Neurology

## 2017-05-23 ENCOUNTER — Encounter (HOSPITAL_COMMUNITY): Payer: Self-pay

## 2017-05-23 DIAGNOSIS — R29818 Other symptoms and signs involving the nervous system: Secondary | ICD-10-CM

## 2017-05-23 DIAGNOSIS — R2689 Other abnormalities of gait and mobility: Secondary | ICD-10-CM | POA: Diagnosis not present

## 2017-05-23 DIAGNOSIS — Z9181 History of falling: Secondary | ICD-10-CM | POA: Diagnosis present

## 2017-05-23 DIAGNOSIS — M6281 Muscle weakness (generalized): Secondary | ICD-10-CM | POA: Diagnosis present

## 2017-05-23 NOTE — Therapy (Signed)
[facility] [address] [place], [place], [address] Phone: [phone]   Fax:  [phone]  Physical Therapy Treatment  Patient Details  Name: Brooke [name] MRN: [id number] Date of Birth: 1985/12/26 Referring Provider: Melvyn Novas, MD   Encounter Date: 05/23/2017  PT End of Session - 05/23/17 1033    Visit Number  4    Number of Visits  16    Date for PT Re-Evaluation  06/19/17 request following 4th session    Authorization Type  Medicaid; request sent 05/08/17 (initial 3); 12 units from 05/17/17-06/27/17    Authorization Time Period  Cert: 6/96/29 - 06/19/17    Authorization - Visit Number  1    Authorization - Number of Visits  12    PT Start Time  [phone] patient late    PT Stop Time  1031    PT Time Calculation (min)  36 min    Equipment Utilized During Treatment  Gait belt    Activity Tolerance  Patient tolerated treatment well;Patient limited by fatigue    Behavior During Therapy  WFL for tasks assessed/performed       Past Medical History:  Diagnosis Date  . Anxiety   . Chronic back pain   . Depression   . Diabetes mellitus without complication (HCC)   . Edema   . MS (multiple sclerosis) (HCC) 09/29/2014    Past Surgical History:  Procedure Laterality Date  . APPENDECTOMY    . CHOLECYSTECTOMY N/A 10/07/2013   Procedure: LAPAROSCOPIC CHOLECYSTECTOMY;  Surgeon: Dalia Heading, MD;  Location: [facility];  Service: General;  Laterality: N/A;  . LAPAROSCOPIC APPENDECTOMY N/A 05/12/2015   Procedure: APPENDECTOMY LAPAROSCOPIC;  Surgeon: Franky Macho, MD;  Location: [facility];  Service: General;  Laterality: N/A;  . WISDOM TOOTH EXTRACTION      There were no vitals filed for this visit.  Subjective Assessment - 05/23/17 1042    Subjective  Patient reports she is feeling ok today, reports her back pian is around 5/10 today/ She reports since her evaluation she has had 2 falls while walking. She states she does not know what causes  them but she has been walking on level ground when it happens. She reports it was a difficult weekend and that she cried a lot from feeling down. She has another appointment with her therapist coming up on 05/31/17.     Pertinent History  diagnosed with MS in 2016,     Limitations  Reading;Standing;Walking;House hold activities    Patient Stated Goals  decrease falls an improve activity tolerance    Currently in Pain?  Yes    Pain Score  5     Pain Location  Back    Pain Orientation  Posterior;Lower    Pain Descriptors / Indicators  Aching    Pain Type  Chronic pain    Pain Onset  More than a month ago    Pain Frequency  Intermittent    Aggravating Factors   with some movement    Pain Relieving Factors  muscle relaxers, rest         [facility] Adult PT Treatment/Exercise - 05/23/17 0001      Knee/Hip Exercises: Standing   Forward Step Up  Both;2 sets;10 reps;Hand Hold: 2;Step Height: 4"    Step Down  Both;2 sets;10 reps    Functional Squat  15 reps;Limitations    Functional Squat Limitations  chair taps, cues required for forward weight shift to prevent LOB posteriorly  Balance Exercises - 05/23/17 1006      Balance Exercises: Standing   Standing Eyes Opened  Narrow base of support (BOS);Foam/compliant surface 2x10 UE flexion and 2x10 forward puch, 1# dowel    Tandem Stance  Eyes open;Foam/compliant surface;3 reps;15 secs    Step Over Hurdles / Cones  Forwrad/backward over 6" hurdle 10x with intermittent Bil UE support; latearl step over 2 x 6" hurdles, 10 reps RT, intermittent 2HHA        PT Education - 05/23/17 1040    Education provided  Yes    Education Details  Educated on exercise/balance activities throughout session. Educated on energy conservation techniques and safety improvements to prevent falls while ambulating. Educated on importance to pick feet up while walking to prevent tripping/falling and to avoid locking knees out while standing to improve circulation from  LE.     Person(s) Educated  Patient    Methods  Explanation;Demonstration;Tactile cues;Verbal cues    Comprehension  Verbalized understanding;Verbal cues required;Tactile cues required;Need further instruction       PT Short Term Goals - 05/08/17 1852      PT SHORT TERM GOAL #1   Title  Pt will be independent in HEP to improve the strength, endurance, and balance.    Time  2    Period  Weeks    Status  New    Target Date  05/22/17      PT SHORT TERM GOAL #2   Title  Patient will improve MMT by 1/2 grade for limited groups to improve muscle endurance during gait and functional mobility.     Time  3    Period  Weeks    Status  New      PT SHORT TERM GOAL #3   Title  Patient will ambulate with LRAD at gait velocity of 0.8 m/s durign 2 MWT, to reduce risk of falling and indicate she is at a safe community ambulation cateogry to improve community access.     Time  3    Period  Weeks    Status  New        PT Long Term Goals - 05/08/17 1855      PT LONG TERM GOAL #1   Title  Pt will verbalize understanding of fall risk strategies and energy conservation strategies to reduce falls risk and to conserve energy.    Time  6    Period  Weeks    Status  New    Target Date  06/19/17      PT LONG TERM GOAL #2   Title  Patient will improve MMT by 1 grade for limited groups to improve muscle endurance during gait and functional mobility to ascend/descend stairs with step over step pattern.     Time  6    Period  Weeks    Status  New      PT LONG TERM GOAL #3   Title  Patient will improve DGI score by 5 points to demonstrate significant improvement of dynamic balance during gait activities and decrease fall risk durign daily activities.     Time  6    Period  Weeks    Status  New         Plan - 05/23/17 0954    Clinical Impression Statement  Patient arrived ~ 10 minutes late to therapy today. She denied back pain and reported minimal fatigue level around 0-1/10 at start of  session. Session focused on balance training and hip  strengthening. Added step ups this session and continued with tandem balance/narrow BOS activities. Pt continues to require moderate cueing to improve spatial awareness and posture to assist with balance activities. RPE progressed to a 3-4/10 by EOS with greatest effort occurring during functional squat training. Patient required verbal cues and demonstration for proper weight shifting during squat to prevent LOB posteriorly. No reports of increased pain through session. She will continue to benefit from skilled PT services to address current impairments and provide education on activity/lifestyle modifications and energy conservation to improve function and QOL.    Rehab Potential  Good    PT Frequency  2x / week    PT Duration  6 weeks    PT Treatment/Interventions  ADLs/Self Care Home Management;Aquatic Therapy;Gait training;Stair training;Functional mobility training;Therapeutic activities;Therapeutic exercise;Balance training;Neuromuscular re-education;Patient/family education;Manual techniques;Passive range of motion;Energy conservation;Taping    PT Next Visit Plan   Discuss daily activities and fatigue ranking to develop an energy conservation plan. Use BORG RPE and fatigue scale 0-10 throughout session. Initiate balance training to improve SLS. Provide energy conservation hadnout for MS.    PT Home Exercise Plan  05/10/17: Single leg stance 15x at counter each leg 1x/day; Tandem stance x15 each leg forward at counter 1 x/day    Consulted and Agree with Plan of Care  Patient       Patient will benefit from skilled therapeutic intervention in order to improve the following deficits and impairments:  Abnormal gait, Decreased activity tolerance, Decreased endurance, Decreased strength, Improper body mechanics, Decreased balance, Decreased mobility, Difficulty walking, Postural dysfunction, Obesity, Pain, Impaired sensation  Visit Diagnosis: Other  abnormalities of gait and mobility  History of falling  Muscle weakness (generalized)  Other symptoms and signs involving the nervous system     Problem List Patient Active Problem List   Diagnosis Date Noted  . Acute appendicitis 05/12/2015  . MS (multiple sclerosis) (HCC) 09/29/2014  . Positive ANA (antinuclear antibody) 07/02/2014  . Abnormal MRI of head 04/22/2014  . Transient diplopia 04/22/2014  . 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 Spell of memory loss 04/22/2014  . Recurrent falls while walking 04/22/2014    Valentino Saxon, PT, DPT Physical Therapist with [facility]  05/23/2017 10:48 AM     [facility] [address] [place], [place], [address] Phone: [phone]   Fax:  [phone]  Name: Brooke [name] MRN: [id number] Date of Birth: 26-Aug-1985

## 2017-05-25 ENCOUNTER — Other Ambulatory Visit: Payer: Self-pay

## 2017-05-25 ENCOUNTER — Encounter (HOSPITAL_COMMUNITY): Payer: Self-pay

## 2017-05-25 ENCOUNTER — Ambulatory Visit (HOSPITAL_COMMUNITY): Payer: Medicaid Other

## 2017-05-25 DIAGNOSIS — R2689 Other abnormalities of gait and mobility: Secondary | ICD-10-CM

## 2017-05-25 DIAGNOSIS — Z9181 History of falling: Secondary | ICD-10-CM

## 2017-05-25 DIAGNOSIS — R29818 Other symptoms and signs involving the nervous system: Secondary | ICD-10-CM

## 2017-05-25 DIAGNOSIS — M6281 Muscle weakness (generalized): Secondary | ICD-10-CM

## 2017-05-25 NOTE — Patient Instructions (Addendum)
   STRAIGHT LEG RAISE - SLR: 1-2 sets of 5-10 repetitions each leg  While lying on your back, raise up your leg with a straight knee.  Keep the opposite knee bent with the foot planted on the ground.     BRIDGING: 1-2 sets of 10 repetitions  While lying on your back with knees bent, tighten your lower abdominals, squeeze your buttocks and then raise your buttocks off the floor/bed as creating a "Bridge" with your body. Hold and then lower yourself and repeat.      SIDELYING CLAMSHELL - CLAM SHELL: 1-2 sets of 10 repetitions  While lying on your side with your knees bent, draw up the top knee while keeping contact of your feet together.  Do not let your pelvis roll back during the lifting movement.

## 2017-05-25 NOTE — Therapy (Signed)
[facility] [address] [place], [place], [address] Phone: [phone]   Fax:  [phone]  Physical Therapy Treatment  Patient Details  Name: Brooke [name] MRN: [id number] Date of Birth: [birth year] Referring Provider: Melvyn Novas, MD   Encounter Date: 05/25/2017  PT End of Session - 05/25/17 1127    Visit Number  5    Number of Visits  16    Date for PT Re-Evaluation  06/19/17 request following 4th session    Authorization Type  Medicaid; request sent 05/08/17 (initial 3); 12 units from 05/17/17-06/27/17    Authorization Time Period  Cert: 5/36/64 - 06/19/17    Authorization - Visit Number  2    Authorization - Number of Visits  12    PT Start Time  1119    PT Stop Time  1200    PT Time Calculation (min)  41 min    Equipment Utilized During Treatment  Gait belt    Activity Tolerance  Patient tolerated treatment well;Patient limited by fatigue    Behavior During Therapy  [facility] for tasks assessed/performed       Past Medical History:  Diagnosis Date  . Anxiety   . Chronic back pain   . Depression   . Diabetes mellitus without complication (HCC)   . Edema   . MS (multiple sclerosis) (HCC) 09/29/2014    Past Surgical History:  Procedure Laterality Date  . APPENDECTOMY    . CHOLECYSTECTOMY N/A 10/07/2013   Procedure: LAPAROSCOPIC CHOLECYSTECTOMY;  Surgeon: Dalia Heading, MD;  Location: [facility];  Service: General;  Laterality: N/A;  . LAPAROSCOPIC APPENDECTOMY N/A 05/12/2015   Procedure: APPENDECTOMY LAPAROSCOPIC;  Surgeon: Franky Macho, MD;  Location: [facility];  Service: General;  Laterality: N/A;  . WISDOM TOOTH EXTRACTION      There were no vitals filed for this visit.  Subjective Assessment - 05/25/17 1123    Subjective  Patient arrives smiling today and reports her pain is around a 1/10. She states she is feeling good today and states it has been a good day so far. She states she started trying to write down her activities  and is thinking about how long each task takes. She states it is not always the intensity of the task that makes it difficult but the amount of time she must exert herself.     Pertinent History  diagnosed with MS in 2016,     Limitations  Reading;Standing;Walking;House hold activities    Patient Stated Goals  decrease falls an improve activity tolerance    Currently in Pain?  Yes    Pain Score  1     Pain Location  Back    Pain Orientation  Posterior;Lower    Pain Descriptors / Indicators  Aching    Pain Type  Chronic pain    Pain Onset  More than a month ago    Pain Frequency  Intermittent    Aggravating Factors   certain movements    Pain Relieving Factors  muscel relaxers, rest breaks        [facility] Adult PT Treatment/Exercise - 05/25/17 0001      Knee/Hip Exercises: Standing   Forward Step Up  Both;1 set;15 reps;Step Height: 4";Hand Hold: 2      Knee/Hip Exercises: Supine   Bridges  Both;2 sets;10 reps    Straight Leg Raises  Right;Left;2 sets;10 reps;4 sets;5 reps;Limitations    Straight Leg Raises Limitations  2x 10 on Rt LE, 4x  5 on Lt LE      Knee/Hip Exercises: Sidelying   Clams  2x 10 reps Bil LE       Balance Exercises - 05/25/17 1142      Balance Exercises: Standing   Standing Eyes Opened  Narrow base of support (BOS);Foam/compliant surface 2x10 UE flexion and 2x10 forward puch, 2# dowel    Tandem Stance  Eyes open;Foam/compliant surface;3 reps;15 secs    Standing, One Foot on a Step  Eyes open;2 inch;10 secs;Limitations;3 reps 6 reps total alternating LE    Partial Tandem Stance  Foam/compliant surface;Eyes open;Limitations;4 reps forward punch with Red TB, alt foot alignment/pull direction        PT Education - 05/25/17 1126    Education provided  Yes    Education Details  educated on exercises throughtuo session including updated HEP    Person(s) Educated  Patient    Methods  Explanation;Verbal cues    Comprehension  Verbalized understanding;Returned  demonstration       PT Short Term Goals - 05/08/17 1852      PT SHORT TERM GOAL #1   Title  Pt will be independent in HEP to improve the strength, endurance, and balance.    Time  2    Period  Weeks    Status  New    Target Date  05/22/17      PT SHORT TERM GOAL #2   Title  Patient will improve MMT by 1/2 grade for limited groups to improve muscle endurance during gait and functional mobility.     Time  3    Period  Weeks    Status  New      PT SHORT TERM GOAL #3   Title  Patient will ambulate with LRAD at gait velocity of 0.8 m/s durign 2 MWT, to reduce risk of falling and indicate she is at a safe community ambulation cateogry to improve community access.     Time  3    Period  Weeks    Status  New        PT Long Term Goals - 05/08/17 1855      PT LONG TERM GOAL #1   Title  Pt will verbalize understanding of fall risk strategies and energy conservation strategies to reduce falls risk and to conserve energy.    Time  6    Period  Weeks    Status  New    Target Date  06/19/17      PT LONG TERM GOAL #2   Title  Patient will improve MMT by 1 grade for limited groups to improve muscle endurance during gait and functional mobility to ascend/descend stairs with step over step pattern.     Time  6    Period  Weeks    Status  New      PT LONG TERM GOAL #3   Title  Patient will improve DGI score by 5 points to demonstrate significant improvement of dynamic balance during gait activities and decrease fall risk durign daily activities.     Time  6    Period  Weeks    Status  New          Plan - 05/25/17 1127    Clinical Impression Statement  Patient arrived today with reduced back pain and overall low fatigue level. Throughout session her fatigue level never reached above a 4/10. Start of session focused on Bil LE strengthening and HEP was updated. Remainder of session focused  on balance activities and patient had good postural control during dynamic challenges with UE on  compliant surface. She will continue to benefit from skilled PT services to address current impairments and provide education on activity/lifestyle modifications and energy conservation to improve function and QOL.    Rehab Potential  Good    PT Frequency  2x / week    PT Duration  6 weeks    PT Treatment/Interventions  ADLs/Self Care Home Management;Aquatic Therapy;Gait training;Stair training;Functional mobility training;Therapeutic activities;Therapeutic exercise;Balance training;Neuromuscular re-education;Patient/family education;Manual techniques;Passive range of motion;Energy conservation;Taping    PT Next Visit Plan  Use BORG RPE and fatigue scale 0-10 throughout session. Provide energy conservation handout for MS. Continue with balance training to improve SLS and increase challenges with cone taps and hurdles. Incorporate head turns on compliant surface.    PT Home Exercise Plan  05/10/17: Single leg stance 15x at counter each leg 1x/day; Tandem stance x15 each leg forward at counter 1 x/day; 05/25/17 - SLR, bridge    Consulted and Agree with Plan of Care  Patient       Patient will benefit from skilled therapeutic intervention in order to improve the following deficits and impairments:  Abnormal gait, Decreased activity tolerance, Decreased endurance, Decreased strength, Improper body mechanics, Decreased balance, Decreased mobility, Difficulty walking, Postural dysfunction, Obesity, Pain, Impaired sensation  Visit Diagnosis: Other abnormalities of gait and mobility  History of falling  Muscle weakness (generalized)  Other symptoms and signs involving the nervous system     Problem List Patient Active Problem List   Diagnosis Date Noted  . Acute appendicitis 05/12/2015  . MS (multiple sclerosis) (HCC) 09/29/2014  . Positive ANA (antinuclear antibody) 07/02/2014  . Abnormal MRI of head 04/22/2014  . Transient diplopia 04/22/2014  . 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 Spell of memory loss  04/22/2014  . Recurrent falls while walking 04/22/2014    Valentino Saxon, PT, DPT Physical Therapist with [facility]  05/25/2017 11:59 AM    [facility] [address] [place], [place], [address] Phone: [phone]   Fax:  [phone]  Name: Brooke [name] MRN: [id number] Date of Birth: [birth year]

## 2017-05-30 ENCOUNTER — Encounter (HOSPITAL_COMMUNITY): Payer: Self-pay

## 2017-05-30 ENCOUNTER — Ambulatory Visit (HOSPITAL_COMMUNITY): Payer: Medicaid Other

## 2017-05-30 DIAGNOSIS — R2689 Other abnormalities of gait and mobility: Secondary | ICD-10-CM | POA: Diagnosis not present

## 2017-05-30 DIAGNOSIS — R29818 Other symptoms and signs involving the nervous system: Secondary | ICD-10-CM

## 2017-05-30 DIAGNOSIS — M6281 Muscle weakness (generalized): Secondary | ICD-10-CM

## 2017-05-30 DIAGNOSIS — Z9181 History of falling: Secondary | ICD-10-CM

## 2017-05-30 NOTE — Therapy (Signed)
[facility] [address] [place], [place], [address] Phone: [phone]   Fax:  [phone]  Physical Therapy Treatment  Patient Details  Name: Brooke [name] MRN: [id number] Date of Birth: Mar 30, 1985 Referring Provider: Melvyn Novas, MD   Encounter Date: 05/30/2017  PT End of Session - 05/30/17 1005    Visit Number  6    Number of Visits  16    Date for PT Re-Evaluation  06/19/17 minireassess 06/08/17    Authorization Type  Medicaid; request sent 05/08/17 (initial 3); 12 units from 05/17/17-06/27/17    Authorization Time Period  Cert: 6/96/29 - 06/19/17    Authorization - Visit Number  3    Authorization - Number of Visits  12    PT Start Time  [phone]    PT Stop Time  1043 5' on nustep, no charge    PT Time Calculation (min)  51 min    Equipment Utilized During Treatment  Gait belt    Activity Tolerance  Patient tolerated treatment well;Patient limited by fatigue fatigue level 4 at EOS    Behavior During Therapy  [facility] for tasks assessed/performed       Past Medical History:  Diagnosis Date  . Anxiety   . Chronic back pain   . Depression   . Diabetes mellitus without complication (HCC)   . Edema   . MS (multiple sclerosis) (HCC) 09/29/2014    Past Surgical History:  Procedure Laterality Date  . APPENDECTOMY    . CHOLECYSTECTOMY N/A 10/07/2013   Procedure: LAPAROSCOPIC CHOLECYSTECTOMY;  Surgeon: Dalia Heading, MD;  Location: [facility];  Service: General;  Laterality: N/A;  . LAPAROSCOPIC APPENDECTOMY N/A 05/12/2015   Procedure: APPENDECTOMY LAPAROSCOPIC;  Surgeon: Franky Macho, MD;  Location: [facility];  Service: General;  Laterality: N/A;  . WISDOM TOOTH EXTRACTION      There were no vitals filed for this visit.  Subjective Assessment - 05/30/17 1003    Subjective  Pt stated she is feeling good today, no reports of pain or fatigue at entrance.      Pertinent History  diagnosed with MS in 2016,     Patient Stated Goals  decrease  falls an improve activity tolerance    Currently in Pain?  No/denies                       [facility] Adult PT Treatment/Exercise - 05/30/17 0001      Knee/Hip Exercises: Standing   Forward Step Up  Both;1 set;15 reps;Hand Hold: 0;Step Height: 6"    Other Standing Knee Exercises  Nustep x 5' L2          Balance Exercises - 05/30/17 1026      Balance Exercises: Standing   Standing Eyes Opened  Narrow base of support (BOS);Head turns    Tandem Stance  Eyes open;Foam/compliant surface;Limitations head turns 10x no foam; on foam UE flexion wiht 2# dowel rod    SLS  Eyes open;Solid surface;5 reps Rt 25", Lt 16"    Partial Tandem Stance  Foam/compliant surface;Eyes open;Limitations;4 reps RTB forward punch and UE flexion with 2# dowel rod tandem     Sidestepping  2 reps;Theraband BTB    Step Over Hurdles / Cones  forward over 6 and 12" 2RT    Other Standing Exercises  cone tapping 3RT BLE          PT Short Term Goals - 05/08/17 1324  PT SHORT TERM GOAL #1   Title  Pt will be independent in HEP to improve the strength, endurance, and balance.    Time  2    Period  Weeks    Status  New    Target Date  05/22/17      PT SHORT TERM GOAL #2   Title  Patient will improve MMT by 1/2 grade for limited groups to improve muscle endurance during gait and functional mobility.     Time  3    Period  Weeks    Status  New      PT SHORT TERM GOAL #3   Title  Patient will ambulate with LRAD at gait velocity of 0.8 m/s durign 2 MWT, to reduce risk of falling and indicate she is at a safe community ambulation cateogry to improve community access.     Time  3    Period  Weeks    Status  New        PT Long Term Goals - 05/08/17 1855      PT LONG TERM GOAL #1   Title  Pt will verbalize understanding of fall risk strategies and energy conservation strategies to reduce falls risk and to conserve energy.    Time  6    Period  Weeks    Status  New    Target Date  06/19/17       PT LONG TERM GOAL #2   Title  Patient will improve MMT by 1 grade for limited groups to improve muscle endurance during gait and functional mobility to ascend/descend stairs with step over step pattern.     Time  6    Period  Weeks    Status  New      PT LONG TERM GOAL #3   Title  Patient will improve DGI score by 5 points to demonstrate significant improvement of dynamic balance during gait activities and decrease fall risk durign daily activities.     Time  6    Period  Weeks    Status  New            Plan - 05/30/17 1058    Clinical Impression Statement  Session foucs on balance training and BLE strengthening.  Pt progressing well with improved activity tolerance through session with minimal rest breaks and fatigue level at 4/10 at EOS.  Added head turns with partial tandem stance for balance that was difficulty for pt, multiple LOB with new activity.  EOS with addition to Nustep for activity tolerance and discussed importance of energy conservation.      Rehab Potential  Good    PT Frequency  2x / week    PT Duration  6 weeks    PT Treatment/Interventions  ADLs/Self Care Home Management;Aquatic Therapy;Gait training;Stair training;Functional mobility training;Therapeutic activities;Therapeutic exercise;Balance training;Neuromuscular re-education;Patient/family education;Manual techniques;Passive range of motion;Energy conservation;Taping    PT Next Visit Plan  Use BORG RPE and fatigue scale 0-10 throughout session. Provide energy conservation handout for MS. Continue with balance training to improve SLS and increase challenges with cone taps and hurdles. Incorporate head turns on compliant surface and gait next session.    PT Home Exercise Plan  05/10/17: Single leg stance 15x at counter each leg 1x/day; Tandem stance x15 each leg forward at counter 1 x/day; 05/25/17 - SLR, bridge       Patient will benefit from skilled therapeutic intervention in order to improve the following  deficits and impairments:  Abnormal gait,  Decreased activity tolerance, Decreased endurance, Decreased strength, Improper body mechanics, Decreased balance, Decreased mobility, Difficulty walking, Postural dysfunction, Obesity, Pain, Impaired sensation  Visit Diagnosis: Other abnormalities of gait and mobility  History of falling  Muscle weakness (generalized)  Other symptoms and signs involving the nervous system     Problem List Patient Active Problem List   Diagnosis Date Noted  . Acute appendicitis 05/12/2015  . MS (multiple sclerosis) (HCC) 09/29/2014  . Positive ANA (antinuclear antibody) 07/02/2014  . Abnormal MRI of head 04/22/2014  . Transient diplopia 04/22/2014  . 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 Spell of memory loss 04/22/2014  . Recurrent falls while walking 04/22/2014   Becky Sax, LPTA; CBIS [phone]  Juel Burrow 05/30/2017, 11:03 AM  [facility] [address] [place], [place], [address] Phone: [phone]   Fax:  [phone]  Name: AMELIYAH SARNO MRN: [id number] Date of Birth: 1985-11-02

## 2017-06-01 ENCOUNTER — Ambulatory Visit (HOSPITAL_COMMUNITY): Payer: Medicaid Other

## 2017-06-01 ENCOUNTER — Encounter (HOSPITAL_COMMUNITY): Payer: Self-pay

## 2017-06-01 DIAGNOSIS — M6281 Muscle weakness (generalized): Secondary | ICD-10-CM

## 2017-06-01 DIAGNOSIS — Z9181 History of falling: Secondary | ICD-10-CM

## 2017-06-01 DIAGNOSIS — R2689 Other abnormalities of gait and mobility: Secondary | ICD-10-CM

## 2017-06-01 DIAGNOSIS — R29818 Other symptoms and signs involving the nervous system: Secondary | ICD-10-CM

## 2017-06-01 NOTE — Therapy (Signed)
[facility] [address] [place], [place], [address] Phone: [phone]   Fax:  [phone]  Physical Therapy Treatment  Patient Details  Name: Brooke [name] MRN: [id number] Date of Birth: [birth year] Referring Provider: Melvyn Novas, MD   Encounter Date: 06/01/2017  PT End of Session - 06/01/17 1034    Visit Number  7    Number of Visits  16    Date for PT Re-Evaluation  06/19/17 Minireassess 06/08/17    Authorization Type  Medicaid; request sent 05/08/17 (initial 3); 12 units from 05/17/17-06/27/17    Authorization Time Period  Cert: 06/04/22 - 06/19/17    Authorization - Visit Number  4    Authorization - Number of Visits  12    PT Start Time  1002 Pt late for apt today    PT Stop Time  1034    PT Time Calculation (min)  32 min    Equipment Utilized During Treatment  Gait belt    Activity Tolerance  Patient tolerated treatment well Minimal increased fatigue this session.    Behavior During Therapy  [facility] for tasks assessed/performed       Past Medical History:  Diagnosis Date  . Anxiety   . Chronic back pain   . Depression   . Diabetes mellitus without complication (HCC)   . Edema   . MS (multiple sclerosis) (HCC) 09/29/2014    Past Surgical History:  Procedure Laterality Date  . APPENDECTOMY    . CHOLECYSTECTOMY N/A 10/07/2013   Procedure: LAPAROSCOPIC CHOLECYSTECTOMY;  Surgeon: Dalia Heading, MD;  Location: [facility];  Service: General;  Laterality: N/A;  . LAPAROSCOPIC APPENDECTOMY N/A 05/12/2015   Procedure: APPENDECTOMY LAPAROSCOPIC;  Surgeon: Franky Macho, MD;  Location: [facility];  Service: General;  Laterality: N/A;  . WISDOM TOOTH EXTRACTION      There were no vitals filed for this visit.  Subjective Assessment - 06/01/17 1032    Subjective  Pt stated her Lt knee feels it's bone on bone today, pain scale 3/10.  Has been more conscience of locking knees and posture.      Pertinent History  diagnosed with MS in 2016,      Patient Stated Goals  decrease falls an improve activity tolerance    Currently in Pain?  Yes    Pain Score  3     Pain Location  Knee    Pain Orientation  Left    Pain Descriptors / Indicators  Aching;Dull;Sharp    Pain Type  Chronic pain    Pain Onset  More than a month ago    Pain Frequency  Intermittent    Aggravating Factors   certain movements    Pain Relieving Factors  muscle relaxers, rest breaks                            Balance Exercises - 06/01/17 1245      Balance Exercises: Standing   Standing Eyes Opened  Narrow base of support (BOS);Head turns    Tandem Stance  Eyes open;Foam/compliant surface;Limitations 2nd set: palvo with tandem stance; 3rd with head movements    SLS  Eyes open;Solid surface;5 reps    Tandem Gait  Forward;2 reps head movements    Retro Gait  2 reps    Sidestepping  2 reps;Theraband    Step Over Hurdles / Cones  forward and sidestep over 6 and 12" 2RT    Other  Standing Exercises  cone tapping 3RT BLE          PT Short Term Goals - 05/08/17 1852      PT SHORT TERM GOAL #1   Title  Pt will be independent in HEP to improve the strength, endurance, and balance.    Time  2    Period  Weeks    Status  New    Target Date  05/22/17      PT SHORT TERM GOAL #2   Title  Patient will improve MMT by 1/2 grade for limited groups to improve muscle endurance during gait and functional mobility.     Time  3    Period  Weeks    Status  New      PT SHORT TERM GOAL #3   Title  Patient will ambulate with LRAD at gait velocity of 0.8 m/s durign 2 MWT, to reduce risk of falling and indicate she is at a safe community ambulation cateogry to improve community access.     Time  3    Period  Weeks    Status  New        PT Long Term Goals - 05/08/17 1855      PT LONG TERM GOAL #1   Title  Pt will verbalize understanding of fall risk strategies and energy conservation strategies to reduce falls risk and to conserve energy.     Time  6    Period  Weeks    Status  New    Target Date  06/19/17      PT LONG TERM GOAL #2   Title  Patient will improve MMT by 1 grade for limited groups to improve muscle endurance during gait and functional mobility to ascend/descend stairs with step over step pattern.     Time  6    Period  Weeks    Status  New      PT LONG TERM GOAL #3   Title  Patient will improve DGI score by 5 points to demonstrate significant improvement of dynamic balance during gait activities and decrease fall risk durign daily activities.     Time  6    Period  Weeks    Status  New            Plan - 06/01/17 1242    Clinical Impression Statement  Pt late for apt, unable to complete full POC.  Session focus on balance training.  Pt improving static stance but continues to have increased difficulty with head and UE movements and SLS activities.  No reports of pain at EOS and minimal increased fatigue through session, no seated rest breaks required.      Rehab Potential  Good    PT Frequency  2x / week    PT Duration  6 weeks    PT Treatment/Interventions  ADLs/Self Care Home Management;Aquatic Therapy;Gait training;Stair training;Functional mobility training;Therapeutic activities;Therapeutic exercise;Balance training;Neuromuscular re-education;Patient/family education;Manual techniques;Passive range of motion;Energy conservation;Taping    PT Next Visit Plan  Use BORG RPE and fatigue scale 0-10 throughout session. Provide energy conservation handout for MS. Continue with balance training to improve SLS and increase challenges with cone taps and hurdles. Incorporate head turns on compliant surface and gait next session.  Next session begin tandem and sidestep on balance beam.      PT Home Exercise Plan  05/10/17: Single leg stance 15x at counter each leg 1x/day; Tandem stance x15 each leg forward at counter 1 x/day; 05/25/17 -  SLR, bridge       Patient will benefit from skilled therapeutic intervention in  order to improve the following deficits and impairments:  Abnormal gait, Decreased activity tolerance, Decreased endurance, Decreased strength, Improper body mechanics, Decreased balance, Decreased mobility, Difficulty walking, Postural dysfunction, Obesity, Pain, Impaired sensation  Visit Diagnosis: Other abnormalities of gait and mobility  History of falling  Muscle weakness (generalized)  Other symptoms and signs involving the nervous system     Problem List Patient Active Problem List   Diagnosis Date Noted  . Acute appendicitis 05/12/2015  . MS (multiple sclerosis) (HCC) 09/29/2014  . Positive ANA (antinuclear antibody) 07/02/2014  . Abnormal MRI of head 04/22/2014  . Transient diplopia 04/22/2014  . 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 Spell of memory loss 04/22/2014  . Recurrent falls while walking 04/22/2014   Becky Sax, LPTA; CBIS [phone]  Juel Burrow 06/01/2017, 12:47 PM  [facility] [address] [place], [place], [address] Phone: [phone]   Fax:  [phone]  Name: Brooke [name] MRN: [id number] Date of Birth: 10/15/1985

## 2017-06-06 ENCOUNTER — Encounter (HOSPITAL_COMMUNITY): Payer: Self-pay

## 2017-06-06 ENCOUNTER — Ambulatory Visit (HOSPITAL_COMMUNITY): Payer: Medicaid Other

## 2017-06-06 DIAGNOSIS — R2689 Other abnormalities of gait and mobility: Secondary | ICD-10-CM

## 2017-06-06 DIAGNOSIS — M6281 Muscle weakness (generalized): Secondary | ICD-10-CM

## 2017-06-06 DIAGNOSIS — R29818 Other symptoms and signs involving the nervous system: Secondary | ICD-10-CM

## 2017-06-06 DIAGNOSIS — Z9181 History of falling: Secondary | ICD-10-CM

## 2017-06-06 NOTE — Therapy (Signed)
[facility] [address] [place], [place], [address] Phone: [phone]   Fax:  [phone]  Physical Therapy Treatment  Patient Details  Name: Brooke [name] MRN: [id number] Date of Birth: 08-26-1985 Referring Provider: Melvyn Novas, MD   Encounter Date: 06/06/2017  PT End of Session - 06/06/17 0955    Visit Number  8    Number of Visits  16    Date for PT Re-Evaluation  06/19/17 minireassess 06/08/17    Authorization Type  Medicaid; request sent 05/08/17 (initial 3); 12 units from 05/17/17-06/27/17    Authorization Time Period  Cert: 6/96/29 - 06/19/17    Authorization - Visit Number  5    Authorization - Number of Visits  12    PT Start Time  0949    PT Stop Time  1032    PT Time Calculation (min)  43 min    Equipment Utilized During Treatment  Gait belt    Activity Tolerance  Patient tolerated treatment well Max fatigue level at 2 today, no rest breaks required    Behavior During Therapy  [facility] for tasks assessed/performed       Past Medical History:  Diagnosis Date  . Anxiety   . Chronic back pain   . Depression   . Diabetes mellitus without complication (HCC)   . Edema   . MS (multiple sclerosis) (HCC) 09/29/2014    Past Surgical History:  Procedure Laterality Date  . APPENDECTOMY    . CHOLECYSTECTOMY N/A 10/07/2013   Procedure: LAPAROSCOPIC CHOLECYSTECTOMY;  Surgeon: Dalia Heading, MD;  Location: [facility];  Service: General;  Laterality: N/A;  . LAPAROSCOPIC APPENDECTOMY N/A 05/12/2015   Procedure: APPENDECTOMY LAPAROSCOPIC;  Surgeon: Franky Macho, MD;  Location: [facility];  Service: General;  Laterality: N/A;  . WISDOM TOOTH EXTRACTION      There were no vitals filed for this visit.  Subjective Assessment - 06/06/17 0952    Subjective  Pt stated her Lt knee is sore/sharp pain with bending, has been more conscience of locking knees and posture.  No reports of fatigue today.      Patient Stated Goals  decrease falls an  improve activity tolerance    Currently in Pain?  Yes    Pain Score  2     Pain Location  Knee    Pain Orientation  Left    Pain Descriptors / Indicators  Sore;Aching;Dull    Pain Type  Chronic pain    Pain Onset  More than a month ago    Pain Frequency  Intermittent    Aggravating Factors   certain movements    Pain Relieving Factors  muscle relaxers, rest breaks           Balance Exercises - 06/06/17 1011      Balance Exercises: Standing   Tandem Stance  Eyes open;Foam/compliant surface;Limitations 1) static 2) head movements 3) pavlo    SLS  Eyes open;Solid surface;5 reps Lt 17", Rt 13"    SLS with Vectors  3 reps 3x 5" intermittent HHA    Balance Beam  balance beam forward and sidestep 2RT    Step Over Hurdles / Cones  forward and sidestep over 6 and 12" 2RT    Heel Raises Limitations  15 no HHA    Toe Raise Limitations  15x        PT Education - 06/06/17 1232    Education provided  Yes    Education Details  reviewed  importance of energy conservation, documentation given    Person(s) Educated  Patient    Methods  Explanation;Demonstration    Comprehension  Verbalized understanding       PT Short Term Goals - 05/08/17 1852      PT SHORT TERM GOAL #1   Title  Pt will be independent in HEP to improve the strength, endurance, and balance.    Time  2    Period  Weeks    Status  New    Target Date  05/22/17      PT SHORT TERM GOAL #2   Title  Patient will improve MMT by 1/2 grade for limited groups to improve muscle endurance during gait and functional mobility.     Time  3    Period  Weeks    Status  New      PT SHORT TERM GOAL #3   Title  Patient will ambulate with LRAD at gait velocity of 0.8 m/s durign 2 MWT, to reduce risk of falling and indicate she is at a safe community ambulation cateogry to improve community access.     Time  3    Period  Weeks    Status  New        PT Long Term Goals - 05/08/17 1855      PT LONG TERM GOAL #1   Title  Pt  will verbalize understanding of fall risk strategies and energy conservation strategies to reduce falls risk and to conserve energy.    Time  6    Period  Weeks    Status  New    Target Date  06/19/17      PT LONG TERM GOAL #2   Title  Patient will improve MMT by 1 grade for limited groups to improve muscle endurance during gait and functional mobility to ascend/descend stairs with step over step pattern.     Time  6    Period  Weeks    Status  New      PT LONG TERM GOAL #3   Title  Patient will improve DGI score by 5 points to demonstrate significant improvement of dynamic balance during gait activities and decrease fall risk durign daily activities.     Time  6    Period  Weeks    Status  New            Plan - 06/06/17 1120    Clinical Impression Statement  Pt presents this session with positivity and minimal reports of fatigue.  Continued session focus with balance training.  Pt able to complete 6 and 12in hurdles with improve stability and minimal LOB during exercise.  Progressed to balance beam for dynamic balance training with min A for safety.  Continues to have difficulty wiht head and UE movements, improving static SLS.  Pt stated max fatigue level at 2/10 today and no rest breaks required through session.  Pt given documentation for energy conservation, verbalized understanding.      Rehab Potential  Good    PT Frequency  2x / week    PT Duration  6 weeks    PT Treatment/Interventions  ADLs/Self Care Home Management;Aquatic Therapy;Gait training;Stair training;Functional mobility training;Therapeutic activities;Therapeutic exercise;Balance training;Neuromuscular re-education;Patient/family education;Manual techniques;Passive range of motion;Energy conservation;Taping    PT Next Visit Plan  Use BORG RPE and fatigue scale 0-10 throughout session. Continue with balance training to improve SLS and increase challenges with cone taps and hurdles. Incorporate head turns on compliant  surface  and gait next session.  Begin warrior poses next session     PT Home Exercise Plan  05/10/17: Single leg stance 15x at counter each leg 1x/day; Tandem stance x15 each leg forward at counter 1 x/day; 05/25/17 - SLR, bridge 4/16: vector stance       Patient will benefit from skilled therapeutic intervention in order to improve the following deficits and impairments:  Abnormal gait, Decreased activity tolerance, Decreased endurance, Decreased strength, Improper body mechanics, Decreased balance, Decreased mobility, Difficulty walking, Postural dysfunction, Obesity, Pain, Impaired sensation  Visit Diagnosis: Other abnormalities of gait and mobility  History of falling  Muscle weakness (generalized)  Other symptoms and signs involving the nervous system     Problem List Patient Active Problem List   Diagnosis Date Noted  . Acute appendicitis 05/12/2015  . MS (multiple sclerosis) (HCC) 09/29/2014  . Positive ANA (antinuclear antibody) 07/02/2014  . Abnormal MRI of head 04/22/2014  . Transient diplopia 04/22/2014  . 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 Spell of memory loss 04/22/2014  . Recurrent falls while walking 04/22/2014   Becky Sax, LPTA; CBIS [phone]  Juel Burrow 06/06/2017, 12:36 PM  [facility] [address] [place], [place], [address] Phone: [phone]   Fax:  [phone]  Name: Brooke [name] MRN: [id number] Date of Birth: July 28, 1985

## 2017-06-08 ENCOUNTER — Ambulatory Visit (HOSPITAL_COMMUNITY): Payer: Medicaid Other

## 2017-06-08 ENCOUNTER — Telehealth (HOSPITAL_COMMUNITY): Payer: Self-pay | Admitting: Internal Medicine

## 2017-06-08 ENCOUNTER — Telehealth (HOSPITAL_COMMUNITY): Payer: Self-pay

## 2017-06-08 NOTE — Telephone Encounter (Signed)
06/08/17  Pt left a message that she missed her appt because she overslept.  She does want a call back so she can reschedule

## 2017-06-08 NOTE — Telephone Encounter (Signed)
I called Ms. Evitt and left a message on her home number on file to inform her she had missed her appointment today for 9:45 AM. I informed her there are no other appointments scheduled at this time and asked that she call the front office to schedule a make up appointment for next week. I gave her the office number [phone].  Valentino Saxon, PT, DPT Physical Therapist with [facility]  06/08/2017 10:15 AM

## 2017-06-19 ENCOUNTER — Telehealth (HOSPITAL_COMMUNITY): Payer: Self-pay

## 2017-06-19 NOTE — Telephone Encounter (Signed)
Pt l/m she wanted to schedule more apptemnts-checked with Fleet Contras she said to make 2-3 apptments no more than twice a wk and none after 5-719. Called to sched no answer. NF 06/19/17

## 2017-06-22 ENCOUNTER — Other Ambulatory Visit: Payer: Self-pay

## 2017-06-22 ENCOUNTER — Encounter (HOSPITAL_COMMUNITY): Payer: Self-pay

## 2017-06-22 ENCOUNTER — Ambulatory Visit (HOSPITAL_COMMUNITY): Payer: Medicaid Other | Attending: Neurology

## 2017-06-22 DIAGNOSIS — Z9181 History of falling: Secondary | ICD-10-CM | POA: Diagnosis present

## 2017-06-22 DIAGNOSIS — R2689 Other abnormalities of gait and mobility: Secondary | ICD-10-CM | POA: Diagnosis not present

## 2017-06-22 DIAGNOSIS — M6281 Muscle weakness (generalized): Secondary | ICD-10-CM | POA: Insufficient documentation

## 2017-06-22 DIAGNOSIS — R29818 Other symptoms and signs involving the nervous system: Secondary | ICD-10-CM | POA: Diagnosis present

## 2017-06-22 NOTE — Therapy (Signed)
[place], [place], [address] Phone: [phone]   Fax:  [phone]  Physical Therapy Treatment/Discharge Summary  Patient Details  Name: Brooke [name] MRN: [id number] Date of Birth: 1985-07-25 Referring Provider: Larey Seat, MD   Encounter Date: 06/22/2017  Progress Note Reporting Period 05/08/17 to 06/22/17  See note below for Objective Data and Assessment of Progress/Goals.   PHYSICAL THERAPY DISCHARGE SUMMARY  Visits from Start of Care: 9  Current functional level related to goals / functional outcomes: Re-assessment performed today and patient has met all 3 short term and long term goals. She demonstrated significant increase in bil LE strength by 1 full grade improved in MMT with exception of bil hip flexion. She also made a significant improvement in DGI by from a 12/24 to a 17/24; this still places her at risk for falls however shows a decreased risk. Her gait velocity in the 3MWT also improved greatly to 1.13 m/s indicating a decreased risk of falling and improve community ambulation access, and Brooke [name] is now able to maintain SLS on Bil LE for 15 seconds. Her ABC Scale score improved from 35% confident in performing functional activities without risk of falling to 62.5% confident. Overall Brooke [name] has demonstrated good compliance with HEP and good motivation throughout therapy. She has been provided a form for a 2 week trial at a local [facility] facility to continue strengthening independently and has stated she is confident she can continue to maintain her progress. Brooke [name] will be discharged after this session.   Remaining deficits: See below details.   Education / Equipment: Educated on progress towards all goals and decreased risk of falls based on objective testing. Educated on exercises to focus on for HEP and on readiness for discharge to be independent with strengthening and continue to improve functional  mobility. Reviewed teh benefits of introducing energy conservation strategies to prevent over fatigue related to her MS.    Plan: Patient agrees to discharge.  Patient goals were met. Patient is being discharged due to meeting the stated rehab goals.  ?????      PT End of Session - 06/22/17 1042    Visit Number  9    Number of Visits  16    Date for PT Re-Evaluation  06/19/17 minireassess 06/08/17    Authorization Type  Medicaid; request sent 05/08/17 (initial 3); 12 units from 05/17/17-06/27/17    Authorization Time Period  Cert: 7/34/28 - 7/68/11    Authorization - Visit Number  6    Authorization - Number of Visits  12    PT Start Time  5726 PT late with evaluation    PT Stop Time  1115    PT Time Calculation (min)  35 min    Equipment Utilized During Treatment  --    Activity Tolerance  Patient tolerated treatment well    Behavior During Therapy  WFL for tasks assessed/performed       Past Medical History:  Diagnosis Date  . Anxiety   . Chronic back pain   . Depression   . Diabetes mellitus without complication ([place])   . Edema   . MS (multiple sclerosis) ([place]) 09/29/2014    Past Surgical History:  Procedure Laterality Date  . APPENDECTOMY    . CHOLECYSTECTOMY N/A 10/07/2013   Procedure: LAPAROSCOPIC CHOLECYSTECTOMY;  Surgeon: Jamesetta So, MD;  Location: [facility];  Service: General;  Laterality: N/A;  . LAPAROSCOPIC APPENDECTOMY N/A 05/12/2015   Procedure: APPENDECTOMY LAPAROSCOPIC;  Surgeon: Aviva Signs, MD;  Location: [facility];  Service: General;  Laterality: N/A;  . WISDOM TOOTH EXTRACTION      There were no vitals filed for this visit.  Subjective Assessment - 06/22/17 1110    Subjective  Patient reports she is feeling well today and that her HEP is going well, she does it on the days she is not in therapy. She reports the clamshell exercise is difficult and she feels her left leg getting tired more easily.  She reports she had a fall on 06/10/17 when she was trying to  step over something on the floor and tripped over it. She states she had a few bruises and c/o pain in her left wrist around the TFCC.    Pertinent History  diagnosed with MS in 2016,     Limitations  Reading;Standing;Walking;House hold activities    How long can you sit comfortably?  unsure    How long can you stand comfortably?  unsure    How long can you walk comfortably?  unsure    Patient Stated Goals  decrease falls an improve activity tolerance    Currently in Pain?  No/denies    Pain Onset  --         [facility] PT Assessment - 06/22/17 0001      Assessment   Medical Diagnosis  Gait/Balance secondary to Multiple Sclerosis    Referring Provider  Dohmeier, Asencion Partridge, MD    Onset Date/Surgical Date  05/09/14 approximatelty 3 years ago      Cognition   Overall Cognitive Status  Within Functional Limits for tasks assessed      Observation/Other Assessments   Activities of Balance Confidence Scale (ABC Scale)   62.5% confident in performing functional activities without risk of falling 35% confident on 05/10/17      Single Leg Stance   Comments  Rt LE = 23 seconds ; Lt LE = 15 seconds      Strength   Right Hip Flexion  4/5    Right Hip Extension  5/5    Right Hip ABduction  5/5    Left Hip Flexion  4-/5    Left Hip Extension  5/5    Left Hip ABduction  4+/5    Right Knee Flexion  5/5    Right Knee Extension  5/5    Left Knee Flexion  5/5    Left Knee Extension  5/5    Right Ankle Dorsiflexion  5/5    Right Ankle Plantar Flexion  4/5    Left Ankle Dorsiflexion  5/5      Transfers   Five time sit to stand comments   11.26, no UE use      Ambulation/Gait   Ambulation Distance (Feet)  670 Feet 3MWT    Assistive device  None    Gait Pattern  Within Functional Limits    Ambulation Surface  Level    Gait velocity  1.13 m/s      Dynamic Gait Index   Level Surface  Normal    Change in Gait Speed  Normal    Gait with Horizontal Head Turns  Mild Impairment    Gait with  Vertical Head Turns  Moderate Impairment    Gait and Pivot Turn  Mild Impairment    Step Over Obstacle  Mild Impairment    Step Around Obstacles  Mild Impairment    Steps  Mild Impairment    Total Score  17    DGI  comment:  17/21 indicates she is at risk for falling during functional/dynamic gait activities        PT Education - 06/22/17 1108    Education provided  Yes    Education Details  Educated on progress towards all goals and decreased risk of falls based on objective testing. Educated on exercises to focus on for HEP and on readiness for discharge to be independent with strengthening and continue to improve functional mobility. Reviewed teh benefits of introducing energy conservation strategies to prevent over fatigue related to her MS.    Person(s) Educated  Patient    Methods  Explanation    Comprehension  Verbalized understanding       PT Short Term Goals - 06/22/17 1041      PT SHORT TERM GOAL #1   Title  Pt will be independent in HEP to improve the strength, endurance, and balance.    Baseline  06/22/17 - going well but concerned with clamshell exercise; every other day for exercises    Time  2    Period  Weeks    Status  Achieved      PT SHORT TERM GOAL #2   Title  Patient will improve MMT by 1/2 grade for limited groups to improve muscle endurance during gait and functional mobility.     Baseline  06/22/17 - see MMT    Time  3    Period  Weeks    Status  Achieved      PT SHORT TERM GOAL #3   Title  Patient will ambulate with LRAD at gait velocity of 0.8 m/s durign 2 MWT, to reduce risk of falling and indicate she is at a safe community ambulation cateogry to improve community access.     Baseline  06/22/17 - 1.13 m/s in 3 MWT    Time  3    Period  Weeks    Status  Achieved        PT Long Term Goals - 06/22/17 1106      PT LONG TERM GOAL #1   Title  Pt will verbalize understanding of fall risk strategies and energy conservation strategies to reduce falls risk  and to conserve energy.    Time  6    Period  Weeks    Status  Achieved      PT LONG TERM GOAL #2   Title  Patient will improve MMT by 1 grade for limited groups to improve muscle endurance during gait and functional mobility to ascend/descend stairs with step over step pattern.     Baseline  06/22/17 - exception of hip flexion bil, 1/2 grade improvement    Time  6    Period  Weeks    Status  Achieved      PT LONG TERM GOAL #3   Title  Patient will improve DGI score by 5 points to demonstrate significant improvement of dynamic balance during gait activities and decrease fall risk durign daily activities.     Baseline  06/22/17 - 17/14 - fall risk but significant;y decresed risk from evaluation    Time  6    Period  Weeks    Status  Achieved        Plan - 06/22/17 1108    Clinical Impression Statement  Re-assessment performed today and patient has met all 3 short term and long term goals. She demonstrated significant increase in bil LE strength by 1 full grade improved in MMT with exception of bil hip  flexion. She also made a significant improvement in DGI by from a 12/24 to a 17/24; this still places her at risk for falls however shows a decreased risk. Her gait velocity in the 3MWT also improved greatly to 1.13 m/s indicating a decreased risk of falling and improve community ambulation access, and Brooke [name] is now able to maintain SLS on Bil LE for 15 seconds. Her ABC Scale score improved from 35% confident in performing functional activities without risk of falling to 62.5% confident. Overall Brooke [name] has demonstrated good compliance with HEP and good motivation throughout therapy. She has been provided a form for a 2 week trial at a local [facility] facility to continue strengthening independently and has stated she is confident she can continue to maintain her progress. Brooke [name] will be discharged after this session.    Rehab Potential  Good    PT Frequency  2x / week    PT Duration  6 weeks    PT  Treatment/Interventions  ADLs/Self Care Home Management;Aquatic Therapy;Gait training;Stair training;Functional mobility training;Therapeutic activities;Therapeutic exercise;Balance training;Neuromuscular re-education;Patient/family education;Manual techniques;Passive range of motion;Energy conservation;Taping    PT Next Visit Plan  Discharge this session.    PT Home Exercise Plan  05/10/17: Single leg stance 15x at counter each leg 1x/day; Tandem stance x15 each leg forward at counter 1 x/day; 05/25/17 - SLR, bridge 4/16: vector stance    Consulted and Agree with Plan of Care  Patient       Patient will benefit from skilled therapeutic intervention in order to improve the following deficits and impairments:  Abnormal gait, Decreased activity tolerance, Decreased endurance, Decreased strength, Improper body mechanics, Decreased balance, Decreased mobility, Difficulty walking, Postural dysfunction, Obesity, Pain, Impaired sensation  Visit Diagnosis: Other abnormalities of gait and mobility  History of falling  Muscle weakness (generalized)  Other symptoms and signs involving the nervous system     Problem List Patient Active Problem List   Diagnosis Date Noted  . Acute appendicitis 05/12/2015  . MS (multiple sclerosis) ([place]) 09/29/2014  . Positive ANA (antinuclear antibody) 07/02/2014  . Abnormal MRI of head 04/22/2014  . Transient diplopia 04/22/2014  . 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 Spell of memory loss 04/22/2014  . Recurrent falls while walking 04/22/2014    Kipp Brood, PT, DPT Physical Therapist with [facility]  06/22/2017 11:52 AM    [place], [place], [address] Phone: [phone]   Fax:  [phone]  Name: Brooke [name] MRN: [id number] Date of Birth: 03/31/1985

## 2017-06-26 ENCOUNTER — Telehealth (HOSPITAL_COMMUNITY): Payer: Self-pay | Admitting: Internal Medicine

## 2017-06-26 NOTE — Telephone Encounter (Signed)
06/26/17  pt called to cx after she got phone reminder... she was discharged at last visit and no one told the front desk and the appt wasn't cx

## 2017-06-27 ENCOUNTER — Ambulatory Visit (HOSPITAL_COMMUNITY): Payer: Medicaid Other

## 2017-07-05 NOTE — Progress Notes (Signed)
I agree with the assessment and plan as directed by PT-.The patient is known to me .   Tanmay Halteman, MD  

## 2017-07-28 ENCOUNTER — Emergency Department (HOSPITAL_COMMUNITY)
Admission: EM | Admit: 2017-07-28 | Discharge: 2017-07-28 | Disposition: A | Discharge status: Home / Self Care | Payer: Medicaid Other | Attending: Emergency Medicine | Admitting: Emergency Medicine

## 2017-07-28 ENCOUNTER — Other Ambulatory Visit: Payer: Self-pay

## 2017-07-28 ENCOUNTER — Emergency Department (HOSPITAL_COMMUNITY): Payer: Medicaid Other

## 2017-07-28 ENCOUNTER — Encounter (HOSPITAL_COMMUNITY): Payer: Self-pay | Admitting: Emergency Medicine

## 2017-07-28 DIAGNOSIS — F32A Depression, unspecified: Secondary | ICD-10-CM

## 2017-07-28 DIAGNOSIS — F419 Anxiety disorder, unspecified: Secondary | ICD-10-CM | POA: Diagnosis not present

## 2017-07-28 DIAGNOSIS — Z9049 Acquired absence of other specified parts of digestive tract: Secondary | ICD-10-CM | POA: Insufficient documentation

## 2017-07-28 DIAGNOSIS — Z91040 Latex allergy status: Secondary | ICD-10-CM | POA: Insufficient documentation

## 2017-07-28 DIAGNOSIS — F332 Major depressive disorder, recurrent severe without psychotic features: Secondary | ICD-10-CM | POA: Diagnosis not present

## 2017-07-28 DIAGNOSIS — G8929 Other chronic pain: Secondary | ICD-10-CM

## 2017-07-28 DIAGNOSIS — Z87891 Personal history of nicotine dependence: Secondary | ICD-10-CM | POA: Insufficient documentation

## 2017-07-28 DIAGNOSIS — R45851 Suicidal ideations: Secondary | ICD-10-CM | POA: Diagnosis not present

## 2017-07-28 DIAGNOSIS — G35 Multiple sclerosis: Secondary | ICD-10-CM | POA: Insufficient documentation

## 2017-07-28 DIAGNOSIS — R52 Pain, unspecified: Secondary | ICD-10-CM

## 2017-07-28 DIAGNOSIS — M549 Dorsalgia, unspecified: Secondary | ICD-10-CM | POA: Insufficient documentation

## 2017-07-28 DIAGNOSIS — Z79899 Other long term (current) drug therapy: Secondary | ICD-10-CM | POA: Diagnosis not present

## 2017-07-28 DIAGNOSIS — M79605 Pain in left leg: Secondary | ICD-10-CM | POA: Diagnosis present

## 2017-07-28 DIAGNOSIS — E119 Type 2 diabetes mellitus without complications: Secondary | ICD-10-CM | POA: Insufficient documentation

## 2017-07-28 DIAGNOSIS — F329 Major depressive disorder, single episode, unspecified: Secondary | ICD-10-CM

## 2017-07-28 LAB — COMPREHENSIVE METABOLIC PANEL
ALK PHOS: 64 U/L (ref 38–126)
ALT: 17 U/L (ref 14–54)
ANION GAP: 7 (ref 5–15)
AST: 17 U/L (ref 15–41)
Albumin: 3.5 g/dL (ref 3.5–5.0)
BUN: 10 mg/dL (ref 6–20)
CO2: 29 mmol/L (ref 22–32)
Calcium: 8.9 mg/dL (ref 8.9–10.3)
Chloride: 101 mmol/L (ref 101–111)
Creatinine, Ser: 0.83 mg/dL (ref 0.44–1.00)
GFR calc non Af Amer: >60 mL/min (ref >60)
Glucose, Bld: 118 mg/dL — ABNORMAL HIGH (ref 65–99)
Potassium: 3.2 mmol/L — ABNORMAL LOW (ref 3.5–5.1)
SODIUM: 137 mmol/L (ref 135–145)
Total Bilirubin: 0.6 mg/dL (ref 0.3–1.2)
Total Protein: 7.7 g/dL (ref 6.5–8.1)

## 2017-07-28 LAB — RAPID URINE DRUG SCREEN, HOSP PERFORMED
Amphetamines: POSITIVE — AB
Barbiturates: NOT DETECTED
Benzodiazepines: NOT DETECTED
COCAINE: NOT DETECTED
OPIATES: NOT DETECTED
TETRAHYDROCANNABINOL: NOT DETECTED

## 2017-07-28 LAB — HCG, SERUM, QUALITATIVE: PREG SERUM: NEGATIVE

## 2017-07-28 LAB — CBC
HCT: 37.8 % (ref 36.0–46.0)
HEMOGLOBIN: 12.7 g/dL (ref 12.0–15.0)
MCH: 29.1 pg (ref 26.0–34.0)
MCHC: 33.6 g/dL (ref 30.0–36.0)
MCV: 86.5 fL (ref 78.0–100.0)
Platelets: 287 10*3/uL (ref 150–400)
RBC: 4.37 MIL/uL (ref 3.87–5.11)
RDW: 12.9 % (ref 11.5–15.5)
WBC: 7 10*3/uL (ref 4.0–10.5)

## 2017-07-28 LAB — CBG MONITORING, ED: GLUCOSE-CAPILLARY: 111 mg/dL — AB (ref 65–99)

## 2017-07-28 LAB — ETHANOL: Alcohol, Ethyl (B): <10 mg/dL (ref <10)

## 2017-07-28 LAB — SALICYLATE LEVEL

## 2017-07-28 LAB — ACETAMINOPHEN LEVEL

## 2017-07-28 MED ORDER — GABAPENTIN 300 MG PO CAPS
600.0000 mg | ORAL_CAPSULE | Freq: Two times a day (BID) | ORAL | Status: DC
  Administered 2017-07-28 (×2): 600 mg via ORAL
  Filled 2017-07-28 (×2): qty 2

## 2017-07-28 MED ORDER — MODAFINIL 200 MG PO TABS
200.0000 mg | ORAL_TABLET | Freq: Every morning | ORAL | Status: DC
  Filled 2017-07-28: qty 1

## 2017-07-28 MED ORDER — ACETAMINOPHEN 325 MG PO TABS
650.0000 mg | ORAL_TABLET | Freq: Once | ORAL | Status: AC
  Administered 2017-07-28: 650 mg via ORAL
  Filled 2017-07-28: qty 2

## 2017-07-28 MED ORDER — ALPRAZOLAM 0.5 MG PO TABS: 1.0000 mg | ORAL_TABLET | Freq: Every evening | ORAL | Status: DC | PRN

## 2017-07-28 MED ORDER — KETOROLAC TROMETHAMINE 60 MG/2ML IM SOLN
60.0000 mg | Freq: Once | INTRAMUSCULAR | Status: AC
  Administered 2017-07-28: 60 mg via INTRAMUSCULAR
  Filled 2017-07-28: qty 2

## 2017-07-28 MED ORDER — TIZANIDINE HCL 4 MG PO TABS
4.0000 mg | ORAL_TABLET | Freq: Two times a day (BID) | ORAL | Status: DC | PRN
  Administered 2017-07-28: 4 mg via ORAL
  Filled 2017-07-28 (×2): qty 1

## 2017-07-28 MED ORDER — NAPROXEN 250 MG PO TABS
500.0000 mg | ORAL_TABLET | Freq: Two times a day (BID) | ORAL | Status: DC
  Administered 2017-07-28 (×2): 500 mg via ORAL
  Filled 2017-07-28 (×2): qty 2

## 2017-07-28 NOTE — ED Notes (Signed)
Call from [facility]

## 2017-07-28 NOTE — BH Assessment (Signed)
Writer forwarded patient's information to [facility] Attending Physician Dr. Jennet Maduro for review. Patient declined at this time.

## 2017-07-28 NOTE — ED Provider Notes (Signed)
Patient seen and evaluated by TTS.  They feel as though she is appropriate for outpatient follow-up.  I have personally spoken with her and she is comfortable with this.  She denies to me that she is suicidal or homicidal and is not having any hallucinations.  She states that she just "had a breakdown", but now is fine.  She wants to go home to spend time with her family.  I feel as though discharge is appropriate.  She agrees to return if she experiences additional issues.   Geoffery Lyons, MD 07/28/17 2330

## 2017-07-28 NOTE — ED Notes (Signed)
Pt has been sleeping She is now awake and has returned from [place]- requested and received tylenol for her chronic back pain  When asked regarding suicidal, pt is tearful stating she never told anyone she was suicidal Pt states "I never told anyone I was suicidal"

## 2017-07-28 NOTE — ED Triage Notes (Signed)
Patient alert and oriented x 4. Chief complaint back pain and bilateral leg pain with left hip pain. Patient states recent fall a few days ago. Patient does have a history of MS.
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
## 2017-07-28 NOTE — ED Notes (Signed)
Pt provided meal  Visitor in room speaking in loud, enthusiastic tones

## 2017-07-28 NOTE — ED Notes (Signed)
Meal has been provided,  Pt visitor has just left

## 2017-07-28 NOTE — ED Notes (Signed)
Report received 

## 2017-07-28 NOTE — [facility] Counselor (Signed)
Clinician faxed No Harm Contract to numbers given.   Redmond Pulling, MS, [facility], [facility] Triage Specialist [phone]

## 2017-07-28 NOTE — ED Notes (Signed)
When asked, pt reports that she has been given in the past tramadol  But is only given it in limited amounts so that she will not become dependent upon it  States, "they are trying to ween me off"

## 2017-07-28 NOTE — Discharge Instructions (Signed)
Follow-up with your outpatient counselor as needed, and return to the ER if your symptoms worsen or change.

## 2017-07-28 NOTE — ED Notes (Signed)
Pt continues to request pain meds  When this rn inquires as to comfort throughout the day, pt has requested pain meds

## 2017-07-28 NOTE — ED Notes (Signed)
Out of bed to [facility] 
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
## 2017-07-28 NOTE — Progress Notes (Addendum)
Pt. meets criteria for inpatient treatment per Nira Conn, NP.  Referred out to the following hospitals:  [facility]     [facility]  [facility]  [facility]  [facility]  [facility]  [facility]  [facility]  [facility]       Disposition CSW will continue to follow for placement.  Timmothy Euler. Kaylyn Lim, MSW, LCSWA Disposition Clinical Social Work [phone] (cell) [phone] (office)

## 2017-07-28 NOTE — ED Notes (Signed)
Pt to desk- insist that she wishes to go home now  Wishes to speak with the Doctor now Call to [facility]  Call to [facility] to inform  He reports they will call when they are able and believe it will be within the hour  Pt is informed

## 2017-07-28 NOTE — ED Notes (Addendum)
TTS in process 
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
## 2017-07-31 ENCOUNTER — Telehealth: Payer: Self-pay | Admitting: Neurology

## 2017-07-31 NOTE — Telephone Encounter (Signed)
Called the pt and she states that since her last visit the falls were as frequent as they were then. She did PT and they evaluated her and completed her course just recently end of may very beginning of June despite that she is still having falls. Pt states the numbness and tingling in her leg is new. She still complains of double vision. Pt states that the ocrevus only helps her symptoms for the first two weeks after getting the medication but then she struggles until she is due for next dosage. I have scheduled patient an apt in July 16 at 8:30 am with Dr Vickey Huger. I also informed the pt that I would review her concerns with Dr Vickey Huger and see if she has recommendations in the meantime to help her. Pt verbalized understanding. Will call her back once I discuss with Dr Vickey Huger.

## 2017-07-31 NOTE — Telephone Encounter (Signed)
Pt requesting a call back to discuss the numbness/tingling sensation throughout her left leg. Pt states she has bruises, cuts and scabs from falling so frequently. Please call to advise

## 2017-08-01 ENCOUNTER — Telehealth: Payer: Self-pay | Admitting: Neurology

## 2017-08-01 NOTE — Telephone Encounter (Addendum)
Ocrevus is a potent MS medication. I will confer with Dr. Epimenio Foot about seeing him , and if  any medication that could help to bridge the effects over to the next dose. It is often reported that fatigue returns before the next dose after 6 month , but not Diplopia- Dr Bonnita Hollow Nurse suggested a visit with Dr Epimenio Foot for whose input  I am very grateful.  wil call her with appointment    I will refer to neuro-ophthalmology.

## 2017-08-01 NOTE — Telephone Encounter (Signed)
Called the patient. No answer. LVM informing the patient that I had discussed her concerns with Dr Vickey Huger. Appears the patient's therapy was done in [facility]. Dr Dohmeier would like to have the pt have an initial evaluation completed through the neuro rehab therapy here next to our office. I have LVM informing the patient if she is willing for [place] to put this in for her.   When patient calls back remind her to keep the upcoming apt for Dr Vickey Huger and ask if she is willing for me to place order for therapy to be completed next door. If she is ill place the order and they should contact her to schedule.

## 2017-08-01 NOTE — Telephone Encounter (Signed)
Spoke with Argie and gave appt. with RAS 08/03/17, arrival time 1300 for a 1330 appt/fim

## 2017-08-03 ENCOUNTER — Ambulatory Visit: Payer: Self-pay | Admitting: Neurology

## 2017-08-04 ENCOUNTER — Encounter: Payer: Self-pay | Admitting: Neurology

## 2017-08-08 ENCOUNTER — Ambulatory Visit (INDEPENDENT_AMBULATORY_CARE_PROVIDER_SITE_OTHER): Payer: Medicaid Other | Admitting: Neurology

## 2017-08-08 ENCOUNTER — Encounter: Payer: Self-pay | Admitting: Neurology

## 2017-08-08 ENCOUNTER — Other Ambulatory Visit: Payer: Self-pay

## 2017-08-08 VITALS — BP 114/67 | HR 85 | Resp 18 | Ht 64.0 in | Wt 336.0 lb

## 2017-08-08 DIAGNOSIS — R413 Other amnesia: Secondary | ICD-10-CM

## 2017-08-08 DIAGNOSIS — G35 Multiple sclerosis: Secondary | ICD-10-CM | POA: Diagnosis not present

## 2017-08-08 DIAGNOSIS — H532 Diplopia: Secondary | ICD-10-CM | POA: Diagnosis not present

## 2017-08-08 DIAGNOSIS — R270 Ataxia, unspecified: Secondary | ICD-10-CM | POA: Diagnosis not present

## 2017-08-08 DIAGNOSIS — R296 Repeated falls: Secondary | ICD-10-CM

## 2017-08-08 NOTE — Progress Notes (Signed)
[facility]  PATIENT: Brooke [name] DOB: 10-23-1985  REFERRING DOCTOR OR PCP:  Dr. Vickey Huger; PCP is Fleet Contras SOURCE: Patient, notes from Dr. Vickey Huger, imaging and laboratory reports, MRI images on PACS.  _________________________________   HISTORICAL  CHIEF COMPLAINT:  Chief Complaint  Patient presents with  . Multiple Sclerosis    Dr. Vickey Huger pt. on Ocrevus.  Last infusion 09/06/17. Would like to discuss Ocrevus and other treatment options.  Sts. has not had new MS lesions on Ocrevus, but c/o progression of sx.  Intermittent blurry/doublevision. Increased difficulty with gait/balance, falls. Increased fatigue/fim    HISTORY OF PRESENT ILLNESS:  Brooke [name] is a 32 year old woman with multiple sclerosis followed by Dr. Vickey Huger.  She is currently on Ocrevus.   Despite having stable MRIs, she has noted some continued progression of her MS related symptoms.  She was diagnosed with MS in 2016 after presenting with visual disturbance, diplopia and poor gait.    She was initially placed on Tecfidera.   She did not note any benefit so wanted to switch medications.   She felt she is doing no better with the Ocrevus and wonders what medication would be best for her.     Currently, she has left > right leg weakness.   She denies spasticity.   Gait is off balanced and she falls every week or so.     She has numbness in the left leg.    Leg numbness is fairly constant and she gets occasional left hand numbness.     She has had urgency and some incontinence with urine x one year.    She has rare bowel incontinence.      She notes a lot of fatigue and even has to take a break to change her sheets or other chores.    She feels sleepy many days.  She gets 6 hours most nights. Amitriptyline helps her fall asleep.    She was once on phentermine and it may have helped her fatigue slightly.   However, insurance will not cover it.     She has been told that she snores some.    She has never been told that she has pauses or snorts/gasps.    She had a sleep study and did not have OSA.     However,  she has gained some weight.   She notes depression and anxiety.   She has had some panic attacks.     She feels her cognition is mildly impaired.   She has occasional difficulty with memory.      MRI's 05/13/2016 and 05/16/2016 were personally reviewed and I concur with the readings.:  IMPRESSION:  This MRI of the brain with and without contrast shows the following: 1.   Multiple T2/FLAIR hyperintense foci in the pons and hemispheres in a pattern and configuration consistent with chronic demyelinating plaque associated with multiple sclerosis. None of the foci appears to be acute. When compared to the MRI dated 01/11/2015, there is no interval change. 2.    There is a normal enhancement pattern and there are no acute findings.   IMPRESSION:  This MRI of the cervical spine with and without contrast shows the following: 1.    Foci within the posterior spinal cord adjacent to C3 and the left spinal cord adjacent to C5.   Neither of these appear to be acute. 2.    Minimal disc degenerative changes at C5-C6 and C6-C7 that did not lead to any nerve  root impingement. 3.    There is a normal enhancement pattern and there are no acute findings.  IMPRESSION:  This MRI of the thoracic spine with and without contrast shows the following: 1.   There is a focus within the left anterior spinal cord adjacent to T8-T9 consistent with a demyelinating plaque associated with multiple sclerosis. It appears to be chronic and does not enhance. There is a small focus to the right adjacent to T3-T4.   It is noted on the axial images but not the sagittal images and could represent a small chronic demyelinating plaque or be artifactual. 2.   There is small right lateral disc protrusion at T11-T12. There is no nerve root compression.. 3.    There is a normal enhancement pattern.   REVIEW OF  SYSTEMS: Constitutional: No fevers, chills, sweats, or change in appetite Eyes: No visual changes, double vision, eye pain Ear, nose and throat: No hearing loss, ear pain, nasal congestion, sore throat Cardiovascular: No chest pain, palpitations Respiratory: No shortness of breath at rest or with exertion.   No wheezes GastrointestinaI: No nausea, vomiting, diarrhea, abdominal pain, fecal incontinence Genitourinary: No dysuria, urinary retention or frequency.  No nocturia. Musculoskeletal: No neck pain, back pain Integumentary: No rash, pruritus, skin lesions Neurological: as above Psychiatric: No depression at this time.  No anxiety Endocrine: No palpitations, diaphoresis, change in appetite, change in weigh or increased thirst Hematologic/Lymphatic: No anemia, purpura, petechiae. Allergic/Immunologic: No itchy/runny eyes, nasal congestion, recent allergic reactions, rashes  ALLERGIES: Allergies  Allergen Reactions  . Lexapro [Escitalopram Oxalate] Other (See Comments)    Suicidal thoughts, lowered libido  . Latex Hives    HOME MEDICATIONS:  Current Outpatient Medications:  .  ALPRAZolam (XANAX) 0.5 MG tablet, Take 0.5 mg by mouth daily as needed., Disp: , Rfl: 2 .  amitriptyline (ELAVIL) 25 MG tablet, Take 25 mg by mouth at bedtime as needed. for sleep, Disp: , Rfl: 2 .  calcium-vitamin D 250-100 MG-UNIT tablet, Take 1 tablet by mouth 2 (two) times daily., Disp: 60 tablet, Rfl: 5 .  cetirizine (ZYRTEC) 10 MG tablet, Take 10 mg by mouth daily as needed., Disp: , Rfl: 5 .  diclofenac (VOLTAREN) 75 MG EC tablet, Take 75 mg by mouth 2 (two) times daily as needed. for pain, Disp: , Rfl: 2 .  ferrous sulfate 325 (65 FE) MG tablet, Take 1 tablet (325 mg total) by mouth every morning., Disp: 90 tablet, Rfl: 3 .  fluticasone (FLONASE) 50 MCG/ACT nasal spray, Place 1 spray into both nostrils daily as needed for allergies or rhinitis., Disp: , Rfl:  .  gabapentin (NEURONTIN) 300 MG  capsule, Take 2 capsules (600 mg total) by mouth 2 (two) times daily., Disp: 120 capsule, Rfl: 5 .  Multiple Vitamin (MULTIVITAMIN) tablet, Take 1 tablet by mouth daily., Disp: , Rfl:  .  naproxen (NAPROSYN) 500 MG tablet, Take 1 tablet (500 mg total) by mouth 2 (two) times daily with a meal., Disp: 60 tablet, Rfl: 5 .  ocrelizumab (OCREVUS) 300 MG/10ML injection, Ocrelizumab 300mg  IV on day one, repeat 300mg  IV on day 15.  Then, every 6 mos. infuse 600mg  IV., Disp: 2 each, Rfl: 2 .  phentermine (ADIPEX-P) 37.5 MG tablet, Take 37.5 mg by mouth every morning., Disp: , Rfl: 2 .  tiZANidine (ZANAFLEX) 4 MG tablet, Take 4 mg by mouth 2 (two) times daily as needed., Disp: , Rfl: 2 .  traMADol (ULTRAM) 50 MG tablet, Take 1 tablet (50 mg  total) by mouth every 6 (six) hours as needed for severe pain., Disp: 30 tablet, Rfl: 0  PAST MEDICAL HISTORY: Past Medical History:  Diagnosis Date  . Anxiety   . Chronic back pain   . Depression   . Diabetes mellitus without complication (HCC)   . Edema   . MS (multiple sclerosis) (HCC) 09/29/2014    PAST SURGICAL HISTORY: Past Surgical History:  Procedure Laterality Date  . APPENDECTOMY    . CHOLECYSTECTOMY N/A 10/07/2013   Procedure: LAPAROSCOPIC CHOLECYSTECTOMY;  Surgeon: Dalia Heading, MD;  Location: [facility];  Service: General;  Laterality: N/A;  . LAPAROSCOPIC APPENDECTOMY N/A 05/12/2015   Procedure: APPENDECTOMY LAPAROSCOPIC;  Surgeon: Franky Macho, MD;  Location: [facility];  Service: General;  Laterality: N/A;  . WISDOM TOOTH EXTRACTION      FAMILY HISTORY: Family History  Problem Relation Age of Onset  . Diabetes Mother   . Colon polyps Mother        hx of cancer  . Other Mother        DDD Lumber, cervical  . Sleep apnea Father     SOCIAL HISTORY:  Social History   Socioeconomic History  . Marital status: Single    Spouse name: Not on file  . Number of children: Not on file  . Years of education: 2 y colleg  . Highest education level:  Not on file  Occupational History  . Occupation: work  Engineer, production  . Financial resource strain: Not on file  . Food insecurity:    Worry: Not on file    Inability: Not on file  . Transportation needs:    Medical: Not on file    Non-medical: Not on file  Tobacco Use  . Smoking status: Former Smoker    Last attempt to quit: 09/16/2007    Years since quitting: 9.9  . Smokeless tobacco: Never Used  Substance and Sexual Activity  . Alcohol use: No    Alcohol/week: 0.0 oz  . Drug use: No    Types: Marijuana    Comment: no drug use since 2010  . Sexual activity: Never    Birth control/protection: None  Lifestyle  . Physical activity:    Days per week: Not on file    Minutes per session: Not on file  . Stress: Not on file  Relationships  . Social connections:    Talks on phone: Not on file    Gets together: Not on file    Attends religious service: Not on file    Active member of club or organization: Not on file    Attends meetings of clubs or organizations: Not on file    Relationship status: Not on file  . Intimate partner violence:    Fear of current or ex partner: Not on file    Emotionally abused: Not on file    Physically abused: Not on file    Forced sexual activity: Not on file  Other Topics Concern  . Not on file  Social History Narrative   Drinks 1-2 large glasses of caffeine daily.        PHYSICAL EXAM  Vitals:   08/08/17 1330  BP: 114/67  Pulse: 85  Resp: 18  Weight: (!) 336 lb (152.4 kg)  Height: 5\' 4"  (1.626 m)    Body mass index is 57.67 kg/m.   General: The patient is well-developed and well-nourished and in no acute distress  Eyes:  Funduscopic exam shows normal optic discs and retinal vessels.  Neck: The neck is supple, no carotid bruits are noted.  The neck is nontender.  Cardiovascular: The heart has a regular rate and rhythm with a normal S1 and S2. There were no murmurs, gallops or rubs.   Skin: Extremities are without  significant edema.  Musculoskeletal: The back is mildly tender. Neurologic Exam  Mental status: The patient is alert and oriented x 3 at the time of the examination. The patient has apparent normal recent and remote memory, with an apparently normal attention span and concentration ability.   Speech is normal.  Cranial nerves: Extraocular movements are full. Pupils are equal, round, and reactive to light and accomodation.  Visual fields are full.  Facial symmetry is present. There is good facial sensation to soft touch bilaterally.Facial strength is normal.  Trapezius and sternocleidomastoid strength is normal. No dysarthria is noted.  The tongue is midline, and the patient has symmetric elevation of the soft palate. No obvious hearing deficits are noted.  Motor:  Muscle bulk is normal.   Tone is mildly increased in the left leg.  Strength was 4/5 in the left leg.  Sensory: She has reduced sensation to touch and vibration in the left leg relative to the right.  Touch sensation was mildly reduced in the left arm relative to the right.  Coordination: Cerebellar testing reveals good finger-nose-finger and heel-to-shin bilaterally.  Gait and station: Station is normal.  The gait is mildly wide and she has a left foot drop.  She cannot tandem walk.  The Romberg is negative..   Reflexes: Deep tendon reflexes are symmetric and normal bilaterally.   Plantar responses are flexor.    DIAGNOSTIC DATA (LABS, IMAGING, TESTING) - I reviewed patient records, labs, notes, testing and imaging myself where available.  Lab Results  Component Value Date   WBC 7.0 07/28/2017   HGB 12.7 07/28/2017   HCT 37.8 07/28/2017   MCV 86.5 07/28/2017   PLT 287 07/28/2017      Component Value Date/Time   NA 137 07/28/2017 0119   NA 138 04/24/2017 1028   K 3.2 (L) 07/28/2017 0119   CL 101 07/28/2017 0119   CO2 29 07/28/2017 0119   GLUCOSE 118 (H) 07/28/2017 0119   BUN 10 07/28/2017 0119   BUN 12 04/24/2017  1028   CREATININE 0.83 07/28/2017 0119   CALCIUM 8.9 07/28/2017 0119   PROT 7.7 07/28/2017 0119   PROT 7.2 04/24/2017 1028   ALBUMIN 3.5 07/28/2017 0119   ALBUMIN 3.8 04/24/2017 1028   AST 17 07/28/2017 0119   ALT 17 07/28/2017 0119   ALKPHOS 64 07/28/2017 0119   BILITOT 0.6 07/28/2017 0119   BILITOT 0.3 04/24/2017 1028   GFRNONAA >60 07/28/2017 0119   GFRAA >60 07/28/2017 0119       ASSESSMENT AND PLAN  MS (multiple sclerosis) (HCC)  Transient diplopia  Ataxia  Spell of memory loss  Recurrent falls while walking   1   she is concerned that she has had mild progression despite being on Ocrevus.  She is interested in switching to a different medication to see if that might slow her progression down more.  She is potentially interested in Tysabri.  She was low titer positive with a JCV antibody test in 2017 and we discussed that I would only consider Tysabri for her if they retest was negative or low positive.  If she is not middle or high positive then we will place her on Mayzent or Gilenya. 2.   If fatigue  and sleepiness do not improve, consider stopping the amitriptyline and doing a sleep study. 3.   I discussed exercises and trying to lose weight to help her back pain more.  Unfortunately, insurance will not cover phentermine for her 4.  Return in 6 months or sooner if there are new or worsening neurologic symptoms.     Addis Tuohy A. Epimenio Foot, MD, [facility] 08/08/2017, 1:36 PM Certified in Neurology, Clinical Neurophysiology, Sleep Medicine, Pain Medicine and Neuroimaging  [facility] [address] [place], [place] [address] [phone]

## 2017-08-09 LAB — CBC WITH DIFFERENTIAL/PLATELET
BASOS ABS: 0 10*3/uL (ref 0.0–0.2)
Basos: 0 %
EOS (ABSOLUTE): 0.1 10*3/uL (ref 0.0–0.4)
EOS: 1 %
HEMATOCRIT: 37 % (ref 34.0–46.6)
Hemoglobin: 12.7 g/dL (ref 11.1–15.9)
Immature Grans (Abs): 0 10*3/uL (ref 0.0–0.1)
Immature Granulocytes: 0 %
LYMPHS ABS: 1.5 10*3/uL (ref 0.7–3.1)
Lymphs: 24 %
MCH: 29 pg (ref 26.6–33.0)
MCHC: 34.3 g/dL (ref 31.5–35.7)
MCV: 85 fL (ref 79–97)
MONOS ABS: 0.4 10*3/uL (ref 0.1–0.9)
Monocytes: 6 %
Neutrophils Absolute: 4.4 10*3/uL (ref 1.4–7.0)
Neutrophils: 69 %
Platelets: 326 10*3/uL (ref 150–450)
RBC: 4.38 x10E6/uL (ref 3.77–5.28)
RDW: 13.9 % (ref 12.3–15.4)
WBC: 6.3 10*3/uL (ref 3.4–10.8)

## 2017-08-15 ENCOUNTER — Telehealth: Payer: Self-pay | Admitting: Neurology

## 2017-08-15 NOTE — Telephone Encounter (Signed)
I spoke to [place] over the phone about the JCV antibody.  She is mildly positive with a titer of 0.5, similar to 2017.  She would like to go on Tysabri and we will send in the service request form and try to get her set up at [facility].

## 2017-08-16 ENCOUNTER — Encounter: Payer: Self-pay | Admitting: *Deleted

## 2017-08-28 ENCOUNTER — Other Ambulatory Visit: Payer: Self-pay | Admitting: Neurology

## 2017-08-29 ENCOUNTER — Other Ambulatory Visit: Payer: Self-pay | Admitting: Neurology

## 2017-08-29 MED FILL — OCREVUS 300 MG/10ML SOLN: 300 | 15 days supply | Qty: 20 | Fill #0

## 2017-09-05 ENCOUNTER — Telehealth: Payer: Self-pay | Admitting: *Deleted

## 2017-09-05 ENCOUNTER — Ambulatory Visit: Payer: Self-pay | Admitting: Neurology

## 2017-09-05 NOTE — Telephone Encounter (Signed)
Per [facility], no PA needed for Tysabri 300mg /76ml IV Q28 days. Tysabri orders faxed to [facility], fax# [phone]/fim

## 2017-09-06 ENCOUNTER — Encounter (HOSPITAL_COMMUNITY): Payer: Medicaid Other

## 2017-09-07 ENCOUNTER — Emergency Department (HOSPITAL_COMMUNITY): Payer: Medicaid Other

## 2017-09-07 ENCOUNTER — Emergency Department (HOSPITAL_COMMUNITY)
Admission: EM | Admit: 2017-09-07 | Discharge: 2017-09-07 | Disposition: A | Discharge status: Home / Self Care | Payer: Medicaid Other | Attending: Emergency Medicine | Admitting: Emergency Medicine

## 2017-09-07 ENCOUNTER — Other Ambulatory Visit: Payer: Self-pay

## 2017-09-07 ENCOUNTER — Encounter (HOSPITAL_COMMUNITY): Payer: Self-pay | Admitting: Emergency Medicine

## 2017-09-07 ENCOUNTER — Telehealth: Payer: Self-pay | Admitting: *Deleted

## 2017-09-07 DIAGNOSIS — M25562 Pain in left knee: Secondary | ICD-10-CM | POA: Diagnosis not present

## 2017-09-07 DIAGNOSIS — Z87891 Personal history of nicotine dependence: Secondary | ICD-10-CM | POA: Diagnosis not present

## 2017-09-07 DIAGNOSIS — Z91040 Latex allergy status: Secondary | ICD-10-CM | POA: Insufficient documentation

## 2017-09-07 DIAGNOSIS — E119 Type 2 diabetes mellitus without complications: Secondary | ICD-10-CM | POA: Insufficient documentation

## 2017-09-07 DIAGNOSIS — Z79899 Other long term (current) drug therapy: Secondary | ICD-10-CM | POA: Diagnosis not present

## 2017-09-07 NOTE — Discharge Instructions (Addendum)
Return if any problems.  See Dr. Harrison for evaluation  °

## 2017-09-07 NOTE — ED Triage Notes (Signed)
Pt C/O left knee pain and swelling that started around 2 months ago.

## 2017-09-07 NOTE — ED Notes (Signed)
Radiology here to pick patient up for xray.

## 2017-09-07 NOTE — Telephone Encounter (Signed)
I spoke with Brooke [name] this morning and gave appt. at [facility] short stay, for her first Tysabri infusion, on 09/27/17 at 10am, arrival time of 0930 to check in.  She was agreeable with this appt/fim

## 2017-09-08 NOTE — ED Provider Notes (Signed)
[facility] EMERGENCY DEPARTMENT Provider Note   CSN: [id number] Arrival date & time: 09/07/17  4098     History   Chief Complaint Chief Complaint  Patient presents with  . Knee Pain    HPI Brooke [name] is a 32 y.o. female.  The history is provided by the patient. No language interpreter was used.  Knee Pain   This is a new problem. Episode onset: mmonths. The problem occurs constantly. The problem has been gradually worsening. The pain is present in the left knee. The quality of the pain is described as aching. Associated symptoms include limited range of motion. She has tried nothing for the symptoms. The treatment provided no relief.  Pt has RA.  Pt reports she has nymbness in her leg.  Pt is scheduled to see a  New MD  Past Medical History:  Diagnosis Date  . Anxiety   . Chronic back pain   . Depression   . Diabetes mellitus without complication (HCC)   . Edema   . MS (multiple sclerosis) (HCC) 09/29/2014    Patient Active Problem List   Diagnosis Date Noted  . Acute appendicitis 05/12/2015  . MS (multiple sclerosis) (HCC) 09/29/2014  . Positive ANA (antinuclear antibody) 07/02/2014  . Abnormal MRI of head 04/22/2014  . Transient diplopia 04/22/2014  . 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 Spell of memory loss 04/22/2014  . Recurrent falls while walking 04/22/2014    Past Surgical History:  Procedure Laterality Date  . APPENDECTOMY    . CHOLECYSTECTOMY N/A 10/07/2013   Procedure: LAPAROSCOPIC CHOLECYSTECTOMY;  Surgeon: Dalia Heading, MD;  Location: [facility];  Service: General;  Laterality: N/A;  . LAPAROSCOPIC APPENDECTOMY N/A 05/12/2015   Procedure: APPENDECTOMY LAPAROSCOPIC;  Surgeon: Franky Macho, MD;  Location: [facility];  Service: General;  Laterality: N/A;  . WISDOM TOOTH EXTRACTION       OB History    Gravida  2   Para      Term      Preterm      AB  2   Living  0     SAB  1   TAB      Ectopic  1   Multiple      Live Births                Home Medications    Prior to Admission medications   Medication Sig Start Date End Date Taking? Authorizing Provider  ALPRAZolam Prudy Feeler) 0.5 MG tablet Take 0.5 mg by mouth daily as needed. 06/27/17   [provider]  amitriptyline (ELAVIL) 25 MG tablet Take 25 mg by mouth at bedtime as needed. for sleep 05/19/17   [provider]  calcium-vitamin D 250-100 MG-UNIT tablet Take 1 tablet by mouth 2 (two) times daily. 04/24/17   Dohmeier, Porfirio Mylar, MD  cetirizine (ZYRTEC) 10 MG tablet Take 10 mg by mouth daily as needed. 03/10/17   [provider]  diclofenac (VOLTAREN) 75 MG EC tablet Take 75 mg by mouth 2 (two) times daily as needed. for pain 06/15/17   [provider]  ferrous sulfate 325 (65 FE) MG tablet Take 1 tablet (325 mg total) by mouth every morning. 04/24/17   Dohmeier, Porfirio Mylar, MD  fluticasone (FLONASE) 50 MCG/ACT nasal spray Place 1 spray into both nostrils daily as needed for allergies or rhinitis.    [provider]  gabapentin (NEURONTIN) 300 MG capsule Take 2 capsules (600 mg total) by mouth 2 (two) times daily. 04/24/17  Dohmeier, Porfirio Mylar, MD  Multiple Vitamin (MULTIVITAMIN) tablet Take 1 tablet by mouth daily.    [provider]  naproxen (NAPROSYN) 500 MG tablet Take 1 tablet (500 mg total) by mouth 2 (two) times daily with a meal. 03/02/17   Darreld Mclean, MD  natalizumab 300 mg in sodium chloride 0.9 % 100 mL Inject 300 mg into the vein every 28 (twenty-eight) days.    [provider]  phentermine (ADIPEX-P) 37.5 MG tablet Take 37.5 mg by mouth every morning. 02/23/17   [provider]  tiZANidine (ZANAFLEX) 4 MG tablet Take 4 mg by mouth 2 (two) times daily as needed. 03/06/17   [provider]  traMADol (ULTRAM) 50 MG tablet Take 1 tablet (50 mg total) by mouth every 6 (six) hours as needed for severe pain. 08/02/16   Butch Penny, NP    Family History Family History  Problem Relation Age of Onset  .  Diabetes Mother   . Colon polyps Mother        hx of cancer  . Other Mother        DDD Lumber, cervical  . Sleep apnea Father     Social History Social History   Tobacco Use  . Smoking status: Former Smoker    Last attempt to quit: 09/16/2007    Years since quitting: 9.9  . Smokeless tobacco: Never Used  Substance Use Topics  . Alcohol use: No    Alcohol/week: 0.0 oz  . Drug use: No    Types: Marijuana    Comment: no drug use since 2010     Allergies   Lexapro [escitalopram oxalate] and Latex   Review of Systems Review of Systems  All other systems reviewed and are negative.    Physical Exam Updated Vital Signs BP (!) 108/56   Pulse 79   Temp 98.2 F (36.8 C) (Oral)   Resp 16   Ht 5\' 4"  (1.626 m)   Wt (!) 152.4 kg (336 lb)   LMP 08/28/2017   SpO2 100%   BMI 57.67 kg/m   Physical Exam  Constitutional: She appears well-developed and well-nourished.  HENT:  Head: Normocephalic.  Eyes: Pupils are equal, round, and reactive to light.  Cardiovascular: Normal rate.  Pulmonary/Chest: Effort normal.  Abdominal: Soft.  Musculoskeletal: She exhibits tenderness and deformity. She exhibits no edema.  Tender left knee no deformity, from,  nv and ns intact   Neurological: She is alert.  Skin: Skin is warm.  Psychiatric: She has a normal mood and affect.  Nursing note and vitals reviewed.    ED Treatments / Results  Labs (all labs ordered are listed, but only abnormal results are displayed) Labs Reviewed - No data to display  EKG None  Radiology Dg Knee Complete 4 Views Left  Result Date: 09/07/2017 CLINICAL DATA:  Medial knee pain, swelling EXAM: LEFT KNEE - COMPLETE 4+ VIEW COMPARISON:  10/05/2007 FINDINGS: No evidence of fracture, dislocation, or joint effusion. No evidence of arthropathy or other focal bone abnormality. Soft tissues are unremarkable. IMPRESSION: Negative. Electronically Signed   By: Charlett Nose M.D.   On: 09/07/2017 19:59     Procedures Procedures (including critical care time)  Medications Ordered in ED Medications - No data to display   Initial Impression / Assessment and Plan / ED Course  I have reviewed the triage vital signs and the nursing notes.  Pertinent labs & imaging results that were available during my care of the patient were reviewed by me  and considered in my medical decision making (see chart for details).     Pt placed in an ace wrap.  Pt advised to follow up with her MD Pt is on multiple medication.  I advised her to review with her MD  Final Clinical Impressions(s) / ED Diagnoses   Final diagnoses:  Acute pain of left knee    ED Discharge Orders    None    An After Visit Summary was printed and given to the patient.    Elson Areas, Cordelia Poche 09/08/17 1147    Benjiman Core, MD 09/09/17 [phone]

## 2017-09-27 ENCOUNTER — Encounter (HOSPITAL_COMMUNITY): Payer: Self-pay

## 2017-09-27 ENCOUNTER — Ambulatory Visit (HOSPITAL_COMMUNITY)
Admission: RE | Admit: 2017-09-27 | Discharge: 2017-09-27 | Disposition: A | Discharge status: Home / Self Care | Payer: Medicaid Other | Source: Ambulatory Visit | Attending: Neurology | Admitting: Neurology

## 2017-09-27 DIAGNOSIS — G35 Multiple sclerosis: Secondary | ICD-10-CM | POA: Insufficient documentation

## 2017-09-27 MED ORDER — SODIUM CHLORIDE 0.9 % IV SOLN
INTRAVENOUS | Status: DC
  Administered 2017-09-27: 10:00:00 via INTRAVENOUS

## 2017-09-27 MED ORDER — SODIUM CHLORIDE 0.9 % IV SOLN
300.0000 mg | INTRAVENOUS | Status: DC
  Administered 2017-09-27: 300 mg via INTRAVENOUS
  Filled 2017-09-27: qty 15

## 2017-09-27 NOTE — Progress Notes (Signed)
First Tysabri infusion given today.  Uneventful.  Pt stayed for 1 hour post infusion for observation.  Pt given next appointment for Sept.11 and also information about Tysabri.

## 2017-09-27 NOTE — Discharge Instructions (Signed)

## 2017-09-28 ENCOUNTER — Encounter: Payer: Self-pay | Admitting: *Deleted

## 2017-10-07 ENCOUNTER — Emergency Department (HOSPITAL_COMMUNITY)
Admission: EM | Admit: 2017-10-07 | Discharge: 2017-10-08 | Disposition: A | Discharge status: Home / Self Care | Payer: Medicaid Other | Attending: Emergency Medicine | Admitting: Emergency Medicine

## 2017-10-07 ENCOUNTER — Other Ambulatory Visit: Payer: Self-pay

## 2017-10-07 ENCOUNTER — Encounter (HOSPITAL_COMMUNITY): Payer: Self-pay | Admitting: Emergency Medicine

## 2017-10-07 DIAGNOSIS — Z91040 Latex allergy status: Secondary | ICD-10-CM | POA: Diagnosis not present

## 2017-10-07 DIAGNOSIS — R221 Localized swelling, mass and lump, neck: Secondary | ICD-10-CM | POA: Diagnosis present

## 2017-10-07 DIAGNOSIS — L03221 Cellulitis of neck: Secondary | ICD-10-CM | POA: Insufficient documentation

## 2017-10-07 DIAGNOSIS — E119 Type 2 diabetes mellitus without complications: Secondary | ICD-10-CM | POA: Insufficient documentation

## 2017-10-07 DIAGNOSIS — Z79899 Other long term (current) drug therapy: Secondary | ICD-10-CM | POA: Insufficient documentation

## 2017-10-07 DIAGNOSIS — Z87891 Personal history of nicotine dependence: Secondary | ICD-10-CM | POA: Diagnosis not present

## 2017-10-07 MED ORDER — DOXYCYCLINE HYCLATE 100 MG PO TABS
100.0000 mg | ORAL_TABLET | Freq: Once | ORAL | Status: AC
  Administered 2017-10-08: 100 mg via ORAL
  Filled 2017-10-07: qty 1

## 2017-10-07 MED ORDER — LIDOCAINE-EPINEPHRINE (PF) 2 %-1:200000 IJ SOLN
20.0000 mL | Freq: Once | INTRAMUSCULAR | Status: DC
  Filled 2017-10-07: qty 20

## 2017-10-07 MED ORDER — DOXYCYCLINE HYCLATE 100 MG PO CAPS: 100.0000 mg | ORAL_CAPSULE | Freq: Two times a day (BID) | ORAL | 0 refills | Status: DC

## 2017-10-07 NOTE — ED Provider Notes (Signed)
[facility] EMERGENCY DEPARTMENT Provider Note   CSN: [id number] Arrival date & time: 10/07/17  2259     History   Chief Complaint Chief Complaint  Patient presents with  . Knot on neck    HPI Brooke [name] is a 32 y.o. female.  Patient with 3 days of painful swelling to the left back of her head and neck.  She states she thought she had a small "pimple there" which she tried to squeeze on her own.  States nothing came out of it.  States she is had increased pain and swelling since.  Has pain with movement of her neck.  No fever.  No nausea or vomiting.  She has a history of MS and gets monthly injections of natalizumab on the 11th of the month but denies any new neurological deficits.  No new weakness, numbness, tingling or visual change.  She is not a diabetic.  The history is provided by the patient.    Past Medical History:  Diagnosis Date  . Anxiety   . Chronic back pain   . Depression   . Diabetes mellitus without complication (HCC)   . Edema   . MS (multiple sclerosis) (HCC) 09/29/2014    Patient Active Problem List   Diagnosis Date Noted  . Acute appendicitis 05/12/2015  . MS (multiple sclerosis) (HCC) 09/29/2014  . Positive ANA (antinuclear antibody) 07/02/2014  . Abnormal MRI of head 04/22/2014  . Transient diplopia 04/22/2014  . 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 Spell of memory loss 04/22/2014  . Recurrent falls while walking 04/22/2014    Past Surgical History:  Procedure Laterality Date  . APPENDECTOMY    . CHOLECYSTECTOMY N/A 10/07/2013   Procedure: LAPAROSCOPIC CHOLECYSTECTOMY;  Surgeon: Dalia Heading, MD;  Location: [facility];  Service: General;  Laterality: N/A;  . LAPAROSCOPIC APPENDECTOMY N/A 05/12/2015   Procedure: APPENDECTOMY LAPAROSCOPIC;  Surgeon: Franky Macho, MD;  Location: [facility];  Service: General;  Laterality: N/A;  . WISDOM TOOTH EXTRACTION       OB History    Gravida  2   Para      Term      Preterm      AB  2   Living  0     SAB    1   TAB      Ectopic  1   Multiple      Live Births               Home Medications    Prior to Admission medications   Medication Sig Start Date End Date Taking? Authorizing Provider  ALPRAZolam Prudy Feeler) 0.5 MG tablet Take 0.5 mg by mouth daily as needed. 06/27/17  Yes [provider]  amitriptyline (ELAVIL) 25 MG tablet Take 25 mg by mouth at bedtime as needed. for sleep 05/19/17  Yes [provider]  calcium-vitamin D 250-100 MG-UNIT tablet Take 1 tablet by mouth 2 (two) times daily. 04/24/17  Yes Dohmeier, Porfirio Mylar, MD  cetirizine (ZYRTEC) 10 MG tablet Take 10 mg by mouth daily as needed. 03/10/17  Yes [provider]  diclofenac (VOLTAREN) 75 MG EC tablet Take 75 mg by mouth 2 (two) times daily as needed. for pain 06/15/17  Yes [provider]  ferrous sulfate 325 (65 FE) MG tablet Take 1 tablet (325 mg total) by mouth every morning. 04/24/17  Yes Dohmeier, Porfirio Mylar, MD  fluticasone (FLONASE) 50 MCG/ACT nasal spray Place 1 spray into both nostrils daily as needed for allergies or  rhinitis.   Yes [provider]  gabapentin (NEURONTIN) 300 MG capsule Take 2 capsules (600 mg total) by mouth 2 (two) times daily. 04/24/17  Yes Dohmeier, Porfirio Mylar, MD  Multiple Vitamin (MULTIVITAMIN) tablet Take 1 tablet by mouth daily.   Yes [provider]  naproxen (NAPROSYN) 500 MG tablet Take 1 tablet (500 mg total) by mouth 2 (two) times daily with a meal. 03/02/17  Yes Darreld Mclean, MD  natalizumab 300 mg in sodium chloride 0.9 % 100 mL Inject 300 mg into the vein every 28 (twenty-eight) days.   Yes [provider]  phentermine (ADIPEX-P) 37.5 MG tablet Take 37.5 mg by mouth every morning. 02/23/17  Yes [provider]  tiZANidine (ZANAFLEX) 4 MG tablet Take 4 mg by mouth 2 (two) times daily as needed. 03/06/17  Yes [provider]  traMADol (ULTRAM) 50 MG tablet Take 1 tablet (50 mg total) by mouth every 6 (six) hours as needed for  severe pain. 08/02/16  Yes Butch Penny, NP    Family History Family History  Problem Relation Age of Onset  . Diabetes Mother   . Colon polyps Mother        hx of cancer  . Other Mother        DDD Lumber, cervical  . Sleep apnea Father     Social History Social History   Tobacco Use  . Smoking status: Former Smoker    Last attempt to quit: 09/16/2007    Years since quitting: 10.0  . Smokeless tobacco: Never Used  Substance Use Topics  . Alcohol use: No    Alcohol/week: 0.0 standard drinks  . Drug use: No    Types: Marijuana    Comment: no drug use since 2010     Allergies   Lexapro [escitalopram oxalate] and Latex   Review of Systems Review of Systems  Constitutional: Negative for activity change, appetite change and fever.  HENT: Negative for congestion and rhinorrhea.   Eyes: Negative for visual disturbance.  Respiratory: Negative for cough, chest tightness and shortness of breath.   Cardiovascular: Negative for chest pain. Leg swelling: .ro.  Gastrointestinal: Negative for abdominal pain, nausea and vomiting.  Genitourinary: Negative for dysuria, menstrual problem, pelvic pain, vaginal bleeding and vaginal discharge.  Musculoskeletal: Negative for arthralgias and myalgias.  Skin: Positive for wound.  Neurological: Negative for dizziness, seizures, weakness, numbness and headaches.    all other systems are negative except as noted in the HPI and PMH.    Physical Exam Updated Vital Signs BP 117/76 (BP Location: Left Arm)   Pulse 92   Temp 98.8 F (37.1 C) (Oral)   Resp 16   Ht 5\' 4"  (1.626 m)   Wt (!) 152.4 kg   LMP 09/23/2017   SpO2 100%   BMI 57.67 kg/m   Physical Exam  Constitutional: She is oriented to person, place, and time. She appears well-developed and well-nourished. No distress.  HENT:  Head: Normocephalic and atraumatic.  Mouth/Throat: Oropharynx is clear and moist. No oropharyngeal exudate.  Left posterior occiput and neck there is  a 2 x 5 area of induration with tenderness.  No fluctuance.  There is a small scabbed lesion medially with no fluctuance.  Full range of motion of neck without meningismus  Eyes: Pupils are equal, round, and reactive to light. Conjunctivae and EOM are normal.  Neck: Normal range of motion. Neck supple.  No meningismus.  Cardiovascular: Normal rate, regular rhythm, normal heart sounds and intact distal pulses.  No murmur heard. Pulmonary/Chest: Effort normal and breath sounds normal. No respiratory distress. She exhibits no tenderness.  Abdominal: Soft. There is no tenderness. There is no rebound and no guarding.  Musculoskeletal: Normal range of motion. She exhibits no edema or tenderness.  Neurological: She is alert and oriented to person, place, and time. No cranial nerve deficit. She exhibits normal muscle tone. Coordination normal.   5/5 strength throughout. CN 2-12 intact.Equal grip strength.   Skin: Skin is warm.  Psychiatric: She has a normal mood and affect. Her behavior is normal.  Nursing note and vitals reviewed.    ED Treatments / Results  Labs (all labs ordered are listed, but only abnormal results are displayed) Labs Reviewed - No data to display  EKG None  Radiology No results found.  Procedures Aspiration of blood/fluid Date/Time: 10/07/2017 11:52 PM Performed by: Glynn Octave, MD Authorized by: Glynn Octave, MD  Consent: Verbal consent obtained. Risks and benefits: risks, benefits and alternatives were discussed Consent given by: patient Patient understanding: patient states understanding of the procedure being performed Patient identity confirmed: verbally with patient and provided demographic data Time out: Immediately prior to procedure a "time out" was called to verify the correct patient, procedure, equipment, support staff and site/side marked as required. Preparation: Patient was prepped and draped in the usual sterile fashion. Local anesthesia  used: no  Anesthesia: Local anesthesia used: no  Sedation: Patient sedated: no  Patient tolerance: Patient tolerated the procedure well with no immediate complications    (including critical care time)  Medications Ordered in ED Medications  lidocaine-EPINEPHrine (XYLOCAINE W/EPI) 2 %-1:200000 (PF) injection 20 mL (has no administration in time range)     Initial Impression / Assessment and Plan / ED Course  I have reviewed the triage vital signs and the nursing notes.  Pertinent labs & imaging results that were available during my care of the patient were reviewed by me and considered in my medical decision making (see chart for details).    Patient with swelling to her posterior neck.  Suspect cellulitis.  Bedside ultrasound does not show any discrete fluid collection.  Patient declines I&D today but is agreeable to needle aspiration. Needle aspiration shows no pus but only blood.  Will give warm compresses, antibiotics and recheck in 2 days.  Return to the ED sooner with fever, progressive pain, redness, swelling, any other concerns.  EMERGENCY DEPARTMENT US SOFT TISSUE INTERPRETATION "Study: Limited Soft Tissue Ultrasound"  INDICATIONS: Pain and Soft tissue infection Multiple views of the body part were obtained in real-time with a multi-frequency linear probe  PERFORMED BY: Myself IMAGES ARCHIVED?: Yes SIDE:Left BODY PART:Neck INTERPRETATION:  No abcess noted and Cellulitis present     Final Clinical Impressions(s) / ED Diagnoses   Final diagnoses:  Cellulitis of neck    ED Discharge Orders    None       Jolee Critcher, Jeannett Senior, MD 10/08/17 [phone]

## 2017-10-07 NOTE — ED Triage Notes (Signed)
Pt states she has a knot on back L side of head. States pain travels down neck and up into head. Pt states she has a stiff neck and cannot turn head to side. States first noticed 3 days ago.

## 2017-10-07 NOTE — Discharge Instructions (Addendum)
There is no drainable abscess today. Take the antibiotics as prescribed and perform warm compresses 2-3 times daily.  Follow-up with your doctor for a recheck in 2 days.  Return to the ED sooner with worsening pain, redness, swelling, fever or any other concerns.

## 2017-11-01 ENCOUNTER — Encounter (HOSPITAL_COMMUNITY): Payer: Self-pay

## 2017-11-01 ENCOUNTER — Telehealth: Payer: Self-pay | Admitting: Neurology

## 2017-11-01 ENCOUNTER — Ambulatory Visit (HOSPITAL_COMMUNITY)
Admission: RE | Admit: 2017-11-01 | Discharge: 2017-11-01 | Disposition: A | Discharge status: Home / Self Care | Payer: Medicaid Other | Source: Ambulatory Visit | Attending: Neurology | Admitting: Neurology

## 2017-11-01 DIAGNOSIS — G35 Multiple sclerosis: Secondary | ICD-10-CM | POA: Diagnosis present

## 2017-11-01 MED ORDER — SODIUM CHLORIDE 0.9 % IV SOLN
INTRAVENOUS | Status: DC
  Administered 2017-11-01: 14:00:00 via INTRAVENOUS

## 2017-11-01 MED ORDER — DIPHENHYDRAMINE HCL 50 MG/ML IJ SOLN
25.0000 mg | Freq: Once | INTRAMUSCULAR | Status: AC
  Administered 2017-11-01: 25 mg via INTRAVENOUS

## 2017-11-01 MED ORDER — SODIUM CHLORIDE 0.9 % IV SOLN
INTRAVENOUS | Status: DC
  Administered 2017-11-01: 12:00:00 via INTRAVENOUS

## 2017-11-01 MED ORDER — SODIUM CHLORIDE 0.9 % IV SOLN
300.0000 mg | INTRAVENOUS | Status: DC
  Administered 2017-11-01: 300 mg via INTRAVENOUS
  Filled 2017-11-01: qty 15

## 2017-11-01 MED ORDER — FAMOTIDINE IN NACL 20-0.9 MG/50ML-% IV SOLN
20.0000 mg | Freq: Once | INTRAVENOUS | Status: DC
  Filled 2017-11-01: qty 50

## 2017-11-01 MED ORDER — METHYLPREDNISOLONE SODIUM SUCC 125 MG IJ SOLR
125.0000 mg | Freq: Once | INTRAMUSCULAR | Status: AC
  Administered 2017-11-01: 125 mg via INTRAVENOUS

## 2017-11-01 NOTE — Telephone Encounter (Signed)
Lauren with [facility] calling to advise patient is having a reaction to Tysabri. I advised Dr. Epimenio Foot is out of the office and his nurse Faith picked up the call.

## 2017-11-01 NOTE — Telephone Encounter (Signed)
Received a call from Lauren at [facility] short stay. She sts. pt. is there now for Ty #2. W/I 30 min. of starting infusion, she developed generalized rash, itching, tachycardia, coughing.  Lauren stopped Tysabri and gave Solumedrol 125mg  IV, Benadryl 25mg  IV and will also given Pepcid IV.  She will not restart Tysabri.  She is going to also repeat Solumedrol 125mg  IV, repeat Benadryl 25mg  IV if needed, and if sx. do not resolve or get worse, will have pt. seen in the ER.  She will keep pt. for at least one hour, and if sx. do improve/resolve, and do not return, she will d/c pt. home.  I have given appt. with Dr. Epimenio Foot on 11/07/17 to discuss other tx. options for MS.  Dr. Epimenio Foot is out of the office today.  I will check with Dr. Frances Furbish (w/i doc) to see if she has other orders/fim

## 2017-11-01 NOTE — Progress Notes (Signed)
This nurse went into round on patient at 1258 and pt was standing, itching her entire body. See vitals for increased HR and BP. Pt began to have hives over entire body.Tysabri was stopped at 1258. Hypersensitivity protocol started. Benadryl and Solumedrol given (see MAR) and made contact with Dr. Bonnita Hollow office. Office gave verbal orders to repeat benadryl. Also, gave orders to repeat solumedrol if needed. Pt must be at baseline for at least 30 minutes and stay 1 hour after reaction noted. Pt VS at baseline now and declines having itching. Hives are still noted. Will continue to monitor patient.

## 2017-11-01 NOTE — Telephone Encounter (Signed)
I think this is reasonable, as per your conversation with Lauren at short stay. Perhaps you can check in with pt via phone call tomorrow?

## 2017-11-01 NOTE — Discharge Instructions (Signed)

## 2017-11-02 NOTE — Telephone Encounter (Signed)
Spoke with [employer]. She is at home, reports rash,itching have resolved. I confirmed her appt. with [facility] on 11/07/17 at 9am, arrival time 20 min. early to check in, to discuss other tx. options/fim

## 2017-11-07 ENCOUNTER — Ambulatory Visit (INDEPENDENT_AMBULATORY_CARE_PROVIDER_SITE_OTHER): Payer: Medicaid Other | Admitting: Neurology

## 2017-11-07 ENCOUNTER — Encounter: Payer: Self-pay | Admitting: Neurology

## 2017-11-07 ENCOUNTER — Telehealth: Payer: Self-pay | Admitting: *Deleted

## 2017-11-07 ENCOUNTER — Other Ambulatory Visit: Payer: Self-pay

## 2017-11-07 DIAGNOSIS — R29898 Other symptoms and signs involving the musculoskeletal system: Secondary | ICD-10-CM

## 2017-11-07 DIAGNOSIS — G35 Multiple sclerosis: Secondary | ICD-10-CM | POA: Diagnosis not present

## 2017-11-07 DIAGNOSIS — R5383 Other fatigue: Secondary | ICD-10-CM

## 2017-11-07 DIAGNOSIS — R296 Repeated falls: Secondary | ICD-10-CM | POA: Diagnosis not present

## 2017-11-07 DIAGNOSIS — R200 Anesthesia of skin: Secondary | ICD-10-CM

## 2017-11-07 MED ORDER — GABAPENTIN 300 MG PO CAPS: 600.0000 mg | ORAL_CAPSULE | Freq: Three times a day (TID) | ORAL | 5 refills | Status: DC

## 2017-11-07 NOTE — Progress Notes (Signed)
[facility]  PATIENT: Brooke [name] DOB: Mar 14, 1985  REFERRING DOCTOR OR PCP:  Dr. Vickey Huger; PCP is Fleet Contras SOURCE: Patient, notes from Dr. Vickey Huger, imaging and laboratory reports, MRI images on PACS.  _________________________________   HISTORICAL  CHIEF COMPLAINT:  Chief Complaint  Patient presents with  . Multiple Sclerosis    Had an allergic rxn. to Ty infusion #2 last wk (rash, itching, coughing). Here today to discuss other tx. options for MS/fim    HISTORY OF PRESENT ILLNESS:  Brooke [name] is a 32 year old woman with multiple sclerosis followed by Dr. Vickey Huger.  She is currently on Ocrevus.   Despite having stable MRIs, she has noted some continued progression of her MS related symptoms.  Update 11/07/2017: She tolerated her first Tysabri infusion well.  However, she had an allergic reaction with her second Tysabri infusion.  She broke out in hives and had some shortness of breath.   She received Benadryl and had improvement.  However, she has felt much more tired.  She also feels weaker and her left leg feels more numb..      A higher dose of gabapentin 600 mg po tid is helping.    She was initially placed on Tecfidera but wanted to switch after a year and a half to Ocrevus and stayed on that for 2-3 cycles.     She felt she progressed on Ocrevus.     Of note, she was very concerned about the length of time between Ocrevus infusions even though the mechanism of action have been explained to her.  She was more comfortable with an every month infusion prompting the switch to Tysabri.  We had a long conversation today about the various treatment options.  She would be most comfortable with going back on Tecfidera and, she feels in retrospect, that she was stable while on it.  She would also be interested in trying Mayzent or Gilenya if she does not do well when she switches back to Tecfidera.  We also discussed today that the main purpose of a  disease modifying therapy is to prevent new relapses and slowing down progression.  Most of the time people do not feel significantly better on a disease modifying therapy but it alters the disease course.  She feels her fatigue has done worse.  She gets tired with stress short burst of effort.  She continues to note left greater than right leg weakness and numbness.  Her gait is off balance and she follows a couple times a week.  From 08/08/2017: She was diagnosed with MS in 2016 after presenting with visual disturbance, diplopia and poor gait.    She was initially placed on Tecfidera.   She did not note any benefit so wanted to switch medications.   She felt she is doing no better with the Ocrevus and wonders what medication would be best for her.     Currently, she has left > right leg weakness.   She denies spasticity.   Gait is off balanced and she falls every week or so.     She has numbness in the left leg.    Leg numbness is fairly constant and she gets occasional left hand numbness.     She has had urgency and some incontinence with urine x one year.    She has rare bowel incontinence.      She notes a lot of fatigue and even has to take a break to change her sheets or other  chores.    She feels sleepy many days.  She gets 6 hours most nights. Amitriptyline helps her fall asleep.    She was once on phentermine and it may have helped her fatigue slightly.   However, insurance will not cover it.     She has been told that she snores some.   She has never been told that she has pauses or snorts/gasps.    She had a sleep study and did not have OSA.     However,  she has gained some weight.   She notes depression and anxiety.   She has had some panic attacks.     She feels her cognition is mildly impaired.   She has occasional difficulty with memory.      MRI's 05/13/2016 and 05/16/2016 were personally reviewed and I concur with the readings.:  IMPRESSION:  This MRI of the brain with and without  contrast shows the following: 1.   Multiple T2/FLAIR hyperintense foci in the pons and hemispheres in a pattern and configuration consistent with chronic demyelinating plaque associated with multiple sclerosis. None of the foci appears to be acute. When compared to the MRI dated 01/11/2015, there is no interval change. 2.    There is a normal enhancement pattern and there are no acute findings.   IMPRESSION:  This MRI of the cervical spine with and without contrast shows the following: 1.    Foci within the posterior spinal cord adjacent to C3 and the left spinal cord adjacent to C5.   Neither of these appear to be acute. 2.    Minimal disc degenerative changes at C5-C6 and C6-C7 that did not lead to any nerve root impingement. 3.    There is a normal enhancement pattern and there are no acute findings.  IMPRESSION:  This MRI of the thoracic spine with and without contrast shows the following: 1.   There is a focus within the left anterior spinal cord adjacent to T8-T9 consistent with a demyelinating plaque associated with multiple sclerosis. It appears to be chronic and does not enhance. There is a small focus to the right adjacent to T3-T4.   It is noted on the axial images but not the sagittal images and could represent a small chronic demyelinating plaque or be artifactual. 2.   There is small right lateral disc protrusion at T11-T12. There is no nerve root compression.. 3.    There is a normal enhancement pattern.   REVIEW OF SYSTEMS: Constitutional: No fevers, chills, sweats, or change in appetite.  She notes fatigue. Eyes: No visual changes, double vision, eye pain Ear, nose and throat: No hearing loss, ear pain, nasal congestion, sore throat Cardiovascular: No chest pain, palpitations Respiratory: No shortness of breath at rest or with exertion.   No wheezes GastrointestinaI: No nausea, vomiting, diarrhea, abdominal pain, fecal incontinence Genitourinary: No dysuria, urinary  retention or frequency.  No nocturia. Musculoskeletal: No neck pain, back pain Integumentary: No rash, pruritus, skin lesions Neurological: as above Psychiatric: She denies depression or anxiety.   Endocrine: No palpitations, diaphoresis, change in appetite, change in weigh or increased thirst Hematologic/Lymphatic: No anemia, purpura, petechiae. Allergic/Immunologic: No itchy/runny eyes, nasal congestion, recent allergic reactions, rashes  ALLERGIES: Allergies  Allergen Reactions  . Lexapro [Escitalopram Oxalate] Other (See Comments)    Suicidal thoughts, lowered libido  . Tysabri [Natalizumab] Hives    Hives, itching, elevated HR and BP.  [name] Latex Hives    HOME MEDICATIONS:  Current Outpatient Medications:  .  ALPRAZolam (XANAX) 0.5 MG tablet, Take 0.5 mg by mouth daily as needed., Disp: , Rfl: 2 .  amitriptyline (ELAVIL) 25 MG tablet, Take 25 mg by mouth at bedtime as needed. for sleep, Disp: , Rfl: 2 .  calcium-vitamin D 250-100 MG-UNIT tablet, Take 1 tablet by mouth 2 (two) times daily., Disp: 60 tablet, Rfl: 5 .  cetirizine (ZYRTEC) 10 MG tablet, Take 10 mg by mouth daily as needed., Disp: , Rfl: 5 .  diclofenac (VOLTAREN) 75 MG EC tablet, Take 75 mg by mouth 2 (two) times daily as needed. for pain, Disp: , Rfl: 2 .  ferrous sulfate 325 (65 FE) MG tablet, Take 1 tablet (325 mg total) by mouth every morning., Disp: 90 tablet, Rfl: 3 .  fluticasone (FLONASE) 50 MCG/ACT nasal spray, Place 1 spray into both nostrils daily as needed for allergies or rhinitis., Disp: , Rfl:  .  gabapentin (NEURONTIN) 300 MG capsule, Take 2 capsules (600 mg total) by mouth 3 (three) times daily., Disp: 180 capsule, Rfl: 5 .  Multiple Vitamin (MULTIVITAMIN) tablet, Take 1 tablet by mouth daily., Disp: , Rfl:  .  naproxen (NAPROSYN) 500 MG tablet, Take 1 tablet (500 mg total) by mouth 2 (two) times daily with a meal., Disp: 60 tablet, Rfl: 5 .  phentermine (ADIPEX-P) 37.5 MG tablet, Take 37.5 mg by  mouth every morning., Disp: , Rfl: 2 .  tiZANidine (ZANAFLEX) 4 MG tablet, Take 4 mg by mouth 2 (two) times daily as needed., Disp: , Rfl: 2 .  traMADol (ULTRAM) 50 MG tablet, Take 1 tablet (50 mg total) by mouth every 6 (six) hours as needed for severe pain., Disp: 30 tablet, Rfl: 0  PAST MEDICAL HISTORY: Past Medical History:  Diagnosis Date  . Anxiety   . Chronic back pain   . Depression   . Diabetes mellitus without complication (HCC)   . Edema   . MS (multiple sclerosis) (HCC) 09/29/2014    PAST SURGICAL HISTORY: Past Surgical History:  Procedure Laterality Date  . APPENDECTOMY    . CHOLECYSTECTOMY N/A 10/07/2013   Procedure: LAPAROSCOPIC CHOLECYSTECTOMY;  Surgeon: Dalia Heading, MD;  Location: [facility];  Service: General;  Laterality: N/A;  . LAPAROSCOPIC APPENDECTOMY N/A 05/12/2015   Procedure: APPENDECTOMY LAPAROSCOPIC;  Surgeon: Franky Macho, MD;  Location: [facility];  Service: General;  Laterality: N/A;  . WISDOM TOOTH EXTRACTION      FAMILY HISTORY: Family History  Problem Relation Age of Onset  . Diabetes Mother   . Colon polyps Mother        hx of cancer  . Other Mother        DDD Lumber, cervical  . Sleep apnea Father     SOCIAL HISTORY:  Social History   Socioeconomic History  . Marital status: Single    Spouse name: Not on file  . Number of children: Not on file  . Years of education: 2 y colleg  . Highest education level: Not on file  Occupational History  . Occupation: work  Engineer, production  . Financial resource strain: Not on file  . Food insecurity:    Worry: Not on file    Inability: Not on file  . Transportation needs:    Medical: Not on file    Non-medical: Not on file  Tobacco Use  . Smoking status: Former Smoker    Last attempt to quit: 09/16/2007    Years since quitting: 10.1  . Smokeless tobacco: Never Used  Substance and Sexual  Activity  . Alcohol use: No    Alcohol/week: 0.0 standard drinks  . Drug use: No    Types: Marijuana     Comment: no drug use since 2010  . Sexual activity: Never    Birth control/protection: None  Lifestyle  . Physical activity:    Days per week: Not on file    Minutes per session: Not on file  . Stress: Not on file  Relationships  . Social connections:    Talks on phone: Not on file    Gets together: Not on file    Attends religious service: Not on file    Active member of club or organization: Not on file    Attends meetings of clubs or organizations: Not on file    Relationship status: Not on file  . Intimate partner violence:    Fear of current or ex partner: Not on file    Emotionally abused: Not on file    Physically abused: Not on file    Forced sexual activity: Not on file  Other Topics Concern  . Not on file  Social History Narrative   Drinks 1-2 large glasses of caffeine daily.        PHYSICAL EXAM  There were no vitals filed for this visit.  There is no height or weight on file to calculate BMI.   General: The patient is well-developed and well-nourished and in no acute distress  Cardiovascular: The heart has a regular rate and rhythm with a normal S1 and S2. There were no murmurs, gallops or rubs.   Skin: Extremities are without rash or edema.    Musculoskeletal: The back is mildly tender.  Neurologic Exam  Mental status: The patient is alert and oriented x 3 at the time of the examination. The patient has apparent normal recent and remote memory, with an apparently normal attention span and concentration ability.   Speech is normal.  Cranial nerves: Extraocular movements are full.  Facial strength and sensation is normal.  Trapezius strength is normal.  There is no dysarthria.  The tongue is midline, and the patient has symmetric elevation of the soft palate. No obvious hearing deficits are noted.  Motor:  Muscle bulk is normal.   Tone is mildly increased in the left leg.  Strength was 4/5 in the left leg.  Sensory: She has reduced sensation to touch and  vibration in the left leg relative to the right.  Touch sensation was mildly reduced in the left arm relative to the right.  Coordination: Cerebellar testing reveals good finger-nose-finger and heel-to-shin bilaterally.  Gait and station: Station is normal.  Her gait is wide and she has a left foot drop.  She is unable to do a tandem walk.    The Romberg is negative..   Reflexes: Deep tendon reflexes are symmetric and normal in the arms and increase at the knees.[name]     DIAGNOSTIC DATA (LABS, IMAGING, TESTING) - I reviewed patient records, labs, notes, testing and imaging myself where available.  Lab Results  Component Value Date   WBC 6.3 08/08/2017   HGB 12.7 08/08/2017   HCT 37.0 08/08/2017   MCV 85 08/08/2017   PLT 326 08/08/2017      Component Value Date/Time   NA 137 07/28/2017 0119   NA 138 04/24/2017 1028   K 3.2 (L) 07/28/2017 0119   CL 101 07/28/2017 0119   CO2 29 07/28/2017 0119   GLUCOSE 118 (H) 07/28/2017 0119   BUN 10  07/28/2017 0119   BUN 12 04/24/2017 1028   CREATININE 0.83 07/28/2017 0119   CALCIUM 8.9 07/28/2017 0119   PROT 7.7 07/28/2017 0119   PROT 7.2 04/24/2017 1028   ALBUMIN 3.5 07/28/2017 0119   ALBUMIN 3.8 04/24/2017 1028   AST 17 07/28/2017 0119   ALT 17 07/28/2017 0119   ALKPHOS 64 07/28/2017 0119   BILITOT 0.6 07/28/2017 0119   BILITOT 0.3 04/24/2017 1028   GFRNONAA >60 07/28/2017 0119   GFRAA >60 07/28/2017 0119       ASSESSMENT AND PLAN  MS (multiple sclerosis) (HCC) - Plan: gabapentin (NEURONTIN) 300 MG capsule  Recurrent falls while walking - Plan: gabapentin (NEURONTIN) 300 MG capsule  Morbid obesity due to excess calories (HCC) - Plan: gabapentin (NEURONTIN) 300 MG capsule  Left leg weakness  Leg numbness  Other fatigue  1.   He had a long discussion about disease modifying therapies.  She had an allergic reaction to Tysabri and she progressed well on Ocrevus.  She would like to go back on Tecfidera as she felt she did  better than the other 2 medicines.  She also is potentially interested in Mayzent if she has any breakthrough disease while on Tecfidera.   2.  Due to a combination of leg weakness, leg numbness, gait disturbance and fatigue, she is unable to work.  Her disability is unlikely to improve.   4. Return in 3-4 months or sooner if there are new or worsening neurologic symptoms.    Ebony Yorio A. Epimenio Foot, MD, PhD, FAAN Certified in Neurology, Clinical Neurophysiology, Sleep Medicine, Pain Medicine and Neuroimaging Director, [facility] at [facility]  [facility] [address] [place], [place] [address] [phone]

## 2017-11-07 NOTE — Telephone Encounter (Signed)
Spoke with Brooke [name] at 9:15 this am. She had an appt. with Dr. Epimenio Foot at [facility] to discuss other treatment options for her MS. (She had an allergic rxn to Tysabri last wk). I called to see if she was on her way to the appt.  She sts. she is and will be here by 9:30, for her 9am appt.  If she can get here then, Dr. Epimenio Foot can still see her./fim

## 2017-11-08 ENCOUNTER — Other Ambulatory Visit: Payer: Self-pay | Admitting: Orthopaedic Surgery

## 2017-11-20 ENCOUNTER — Telehealth: Payer: Self-pay | Admitting: Neurology

## 2017-11-20 NOTE — Telephone Encounter (Signed)
Spoke with [employer]. They need a new Tecfidera srf.  SRF complete, awaiting RAS sig/fim

## 2017-11-20 NOTE — Telephone Encounter (Signed)
New Tecfidera srf faxed to [employer]/fim

## 2017-11-20 NOTE — Telephone Encounter (Signed)
Pt has called stating she has called [employer] re: her Tecfidera and she has been told they are still waiting on information from Dr Epimenio Foot so she can get medication.  Pt is asking for a call back to discuss

## 2017-12-05 ENCOUNTER — Ambulatory Visit (HOSPITAL_COMMUNITY): Payer: Medicaid Other

## 2018-01-02 ENCOUNTER — Ambulatory Visit (HOSPITAL_COMMUNITY): Payer: Medicaid Other

## 2018-01-05 ENCOUNTER — Emergency Department (HOSPITAL_COMMUNITY)
Admission: EM | Admit: 2018-01-05 | Discharge: 2018-01-06 | Disposition: A | Discharge status: Home / Self Care | Payer: Medicaid Other | Attending: Emergency Medicine | Admitting: Emergency Medicine

## 2018-01-05 DIAGNOSIS — Z87891 Personal history of nicotine dependence: Secondary | ICD-10-CM | POA: Insufficient documentation

## 2018-01-05 DIAGNOSIS — N390 Urinary tract infection, site not specified: Secondary | ICD-10-CM | POA: Diagnosis not present

## 2018-01-05 DIAGNOSIS — M25511 Pain in right shoulder: Secondary | ICD-10-CM

## 2018-01-05 DIAGNOSIS — Z79899 Other long term (current) drug therapy: Secondary | ICD-10-CM | POA: Diagnosis not present

## 2018-01-05 DIAGNOSIS — E119 Type 2 diabetes mellitus without complications: Secondary | ICD-10-CM | POA: Insufficient documentation

## 2018-01-06 ENCOUNTER — Encounter (HOSPITAL_COMMUNITY): Payer: Self-pay | Admitting: Emergency Medicine

## 2018-01-06 ENCOUNTER — Other Ambulatory Visit: Payer: Self-pay

## 2018-01-06 LAB — URINALYSIS, ROUTINE W REFLEX MICROSCOPIC
Bilirubin Urine: NEGATIVE
Glucose, UA: NEGATIVE mg/dL
Hgb urine dipstick: NEGATIVE
Ketones, ur: NEGATIVE mg/dL
Nitrite: POSITIVE — AB
Protein, ur: 30 mg/dL — AB
SPECIFIC GRAVITY, URINE: 1.027 (ref 1.005–1.030)
WBC, UA: >50 WBC/hpf — ABNORMAL HIGH (ref 0–5)
pH: 7 (ref 5.0–8.0)

## 2018-01-06 LAB — PREGNANCY, URINE: PREG TEST UR: NEGATIVE

## 2018-01-06 MED ORDER — CYCLOBENZAPRINE HCL 10 MG PO TABS
10.0000 mg | ORAL_TABLET | Freq: Once | ORAL | Status: AC
  Administered 2018-01-06: 10 mg via ORAL
  Filled 2018-01-06: qty 1

## 2018-01-06 MED ORDER — ONDANSETRON HCL 4 MG PO TABS
4.0000 mg | ORAL_TABLET | Freq: Once | ORAL | Status: AC
  Administered 2018-01-06: 4 mg via ORAL
  Filled 2018-01-06: qty 1

## 2018-01-06 MED ORDER — CEPHALEXIN 500 MG PO CAPS: 500.0000 mg | ORAL_CAPSULE | Freq: Four times a day (QID) | ORAL | 0 refills | Status: DC

## 2018-01-06 MED ORDER — CEPHALEXIN 500 MG PO CAPS
500.0000 mg | ORAL_CAPSULE | Freq: Once | ORAL | Status: AC
  Administered 2018-01-06: 500 mg via ORAL
  Filled 2018-01-06: qty 1

## 2018-01-06 MED ORDER — CYCLOBENZAPRINE HCL 10 MG PO TABS: 10.0000 mg | ORAL_TABLET | Freq: Three times a day (TID) | ORAL | 0 refills | Status: DC

## 2018-01-06 MED ORDER — IBUPROFEN 800 MG PO TABS
800.0000 mg | ORAL_TABLET | Freq: Once | ORAL | Status: AC
  Administered 2018-01-06: 800 mg via ORAL
  Filled 2018-01-06: qty 1

## 2018-01-06 MED ORDER — TRAMADOL HCL 50 MG PO TABS
100.0000 mg | ORAL_TABLET | Freq: Once | ORAL | Status: AC
  Administered 2018-01-06: 100 mg via ORAL
  Filled 2018-01-06: qty 2

## 2018-01-06 MED ORDER — TRAMADOL HCL 50 MG PO TABS: ORAL_TABLET | ORAL | 0 refills | Status: DC

## 2018-01-06 NOTE — Discharge Instructions (Addendum)
Your urine pregnancy test is negative.  Your urine analysis shows a urinary tract infection.  A culture has been sent to the lab.  Your shoulder exam questions muscle strain.  There could also be some pain from inflammation with the recent weather changes.  Please use a heating pad to the shoulder.  Use your naproxen as ordered.  Use Flexeril for spasm in the area, and use Ultram for pain not improved by naproxen and extra strength Tylenol.  Flexeril and Ultram may cause drowsiness, please do not drive a vehicle, operate machinery, drink alcohol, or participate in activities requiring concentration when taking either these medications.  Please see Dr.Avbury in the office for follow-up of your shoulder pain, and also recheck your urine and about 7 to 10 days to ensure that the infection has resolved.

## 2018-01-06 NOTE — ED Triage Notes (Signed)
Pt states she thinks that she slept on her shoulder wrong last night. Pt C/O increased urination, foul odor when she urinates, and dysuria.

## 2018-01-06 NOTE — ED Provider Notes (Signed)
[facility] EMERGENCY DEPARTMENT Provider Note   CSN: [id number] Arrival date & time: 01/05/18  2359     History   Chief Complaint Chief Complaint  Patient presents with  . Shoulder Pain  . Urinary Tract Infection    HPI Brooke [name] is a 32 y.o. female.  Pt reports foul odor to the urine. No blood in urine. Discomfort with urination.  The history is provided by the patient.  Shoulder Pain   This is a new problem. The current episode started yesterday. The problem has been gradually worsening. The pain is present in the right shoulder. The quality of the pain is described as aching. The pain is moderate. Associated symptoms include stiffness. Pertinent negatives include no numbness. Exacerbated by: certain movements. Treatments tried: jnaproxen. The treatment provided no relief. There has been no history of extremity trauma.  Urinary Tract Infection   This is a new problem. The current episode started 2 days ago. The problem occurs intermittently. The problem has been gradually worsening. Quality: urgency. There has been no fever. She is sexually active. There is no history of pyelonephritis. Associated symptoms include urgency. Pertinent negatives include no chills, no nausea, no vomiting, no frequency, no hematuria, no possible pregnancy and no flank pain. She has tried nothing for the symptoms.    Past Medical History:  Diagnosis Date  . Anxiety   . Chronic back pain   . Depression   . Diabetes mellitus without complication (HCC)   . Edema   . MS (multiple sclerosis) (HCC) 09/29/2014    Patient Active Problem List   Diagnosis Date Noted  . Left leg weakness 11/07/2017  . Leg numbness 11/07/2017  . Other fatigue 11/07/2017  . Acute appendicitis 05/12/2015  . MS (multiple sclerosis) (HCC) 09/29/2014  . Positive ANA (antinuclear antibody) 07/02/2014  . Abnormal MRI of head 04/22/2014  . Transient diplopia 04/22/2014  . 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 Spell of memory loss  04/22/2014  . Recurrent falls while walking 04/22/2014    Past Surgical History:  Procedure Laterality Date  . APPENDECTOMY    . CHOLECYSTECTOMY N/A 10/07/2013   Procedure: LAPAROSCOPIC CHOLECYSTECTOMY;  Surgeon: Dalia Heading, MD;  Location: [facility];  Service: General;  Laterality: N/A;  . LAPAROSCOPIC APPENDECTOMY N/A 05/12/2015   Procedure: APPENDECTOMY LAPAROSCOPIC;  Surgeon: Franky Macho, MD;  Location: [facility];  Service: General;  Laterality: N/A;  . WISDOM TOOTH EXTRACTION       OB History    Gravida  2   Para      Term      Preterm      AB  2   Living  0     SAB  1   TAB      Ectopic  1   Multiple      Live Births               Home Medications    Prior to Admission medications   Medication Sig Start Date End Date Taking? Authorizing Provider  ALPRAZolam Prudy Feeler) 0.5 MG tablet Take 0.5 mg by mouth daily as needed. 06/27/17   [provider]  amitriptyline (ELAVIL) 25 MG tablet Take 25 mg by mouth at bedtime as needed. for sleep 05/19/17   [provider]  calcium-vitamin D 250-100 MG-UNIT tablet Take 1 tablet by mouth 2 (two) times daily. 04/24/17   Dohmeier, Porfirio Mylar, MD  cetirizine (ZYRTEC) 10 MG tablet Take 10 mg by mouth daily as needed. 03/10/17  [provider]  diclofenac (VOLTAREN) 75 MG EC tablet Take 75 mg by mouth 2 (two) times daily as needed. for pain 06/15/17   [provider]  Dimethyl Fumarate (TECFIDERA) 120 & 240 MG MISC Take by mouth.    [provider]  Dimethyl Fumarate (TECFIDERA) 240 MG CPDR Take 240 mg by mouth 2 (two) times daily.    [provider]  ferrous sulfate 325 (65 FE) MG tablet Take 1 tablet (325 mg total) by mouth every morning. 04/24/17   Dohmeier, Porfirio Mylar, MD  fluticasone (FLONASE) 50 MCG/ACT nasal spray Place 1 spray into both nostrils daily as needed for allergies or rhinitis.    [provider]  gabapentin (NEURONTIN) 300 MG capsule Take 2 capsules (600 mg total)  by mouth 3 (three) times daily. 11/07/17   Sater, Pearletha Furl, MD  Multiple Vitamin (MULTIVITAMIN) tablet Take 1 tablet by mouth daily.    [provider]  naproxen (NAPROSYN) 500 MG tablet TAKE 1 TABLET (500 MG TOTAL) BY MOUTH 2 (TWO) TIMES DAILY WITH A MEAL. 11/08/17   Darreld Mclean, MD  phentermine (ADIPEX-P) 37.5 MG tablet Take 37.5 mg by mouth every morning. 02/23/17   [provider]  tiZANidine (ZANAFLEX) 4 MG tablet Take 4 mg by mouth 2 (two) times daily as needed. 03/06/17   [provider]  traMADol (ULTRAM) 50 MG tablet Take 1 tablet (50 mg total) by mouth every 6 (six) hours as needed for severe pain. 08/02/16   Butch Penny, NP    Family History Family History  Problem Relation Age of Onset  . Diabetes Mother   . Colon polyps Mother        hx of cancer  . Other Mother        DDD Lumber, cervical  . Sleep apnea Father     Social History Social History   Tobacco Use  . Smoking status: Former Smoker    Last attempt to quit: 09/16/2007    Years since quitting: 10.3  . Smokeless tobacco: Never Used  Substance Use Topics  . Alcohol use: No    Alcohol/week: 0.0 standard drinks  . Drug use: No    Types: Marijuana    Comment: no drug use since 2010     Allergies   Lexapro [escitalopram oxalate]; Tysabri [natalizumab]; and Latex   Review of Systems Review of Systems  Constitutional: Negative for activity change, appetite change, chills and fever.       All ROS Neg except as noted in HPI  HENT: Negative for nosebleeds.   Eyes: Negative for photophobia and discharge.  Respiratory: Negative for cough, shortness of breath and wheezing.   Cardiovascular: Negative for chest pain and palpitations.  Gastrointestinal: Negative for abdominal pain, blood in stool, nausea and vomiting.  Genitourinary: Positive for dysuria and urgency. Negative for flank pain, frequency and hematuria.  Musculoskeletal: Positive for arthralgias and stiffness. Negative for  back pain and neck pain.  Skin: Negative.   Neurological: Negative for dizziness, seizures, speech difficulty and numbness.  Psychiatric/Behavioral: Negative for confusion and hallucinations.     Physical Exam Updated Vital Signs BP 134/67 (BP Location: Left Arm)   Pulse 92   Temp 98.1 F (36.7 C) (Oral)   Resp 18   LMP 12/22/2017   SpO2 100%   Physical Exam  Constitutional: She is oriented to person, place, and time. She appears well-developed and well-nourished.  Non-toxic appearance.  HENT:  Head: Normocephalic.  Right Ear: Tympanic membrane and external  ear normal.  Left Ear: Tympanic membrane and external ear normal.  Eyes: Pupils are equal, round, and reactive to light. EOM and lids are normal.  Neck: Normal range of motion. Neck supple. Carotid bruit is not present.  Cardiovascular: Normal rate, regular rhythm, normal heart sounds, intact distal pulses and normal pulses.  Pulmonary/Chest: Breath sounds normal. No respiratory distress.  Abdominal: Soft. Bowel sounds are normal. There is no splenomegaly or hepatomegaly. There is no tenderness. There is no guarding and no CVA tenderness.  Musculoskeletal:       Right shoulder: She exhibits decreased range of motion, tenderness and pain. She exhibits no crepitus and no deformity.       Arms: Capillary refill is less than 2 seconds bilaterally.  Radial pulses are 2+ bilaterally.  There is full range of motion of the fingers, wrist, elbow on the right.  There is pain with attempted range of motion of the right shoulder.  There is no deformity of the clavicle or scapula.  No hot areas appreciated.  No significant crepitus.  Lymphadenopathy:       Head (right side): No submandibular adenopathy present.       Head (left side): No submandibular adenopathy present.    She has no cervical adenopathy.  Neurological: She is alert and oriented to person, place, and time. She has normal strength. No cranial nerve deficit or sensory  deficit.  Grip is symmetrical.  No motor or sensory deficits appreciated of the upper extremities.  Skin: Skin is warm and dry.  Psychiatric: She has a normal mood and affect. Her speech is normal.  Nursing note and vitals reviewed.    ED Treatments / Results  Labs (all labs ordered are listed, but only abnormal results are displayed) Labs Reviewed  URINALYSIS, ROUTINE W REFLEX MICROSCOPIC  PREGNANCY, URINE    EKG None  Radiology No results found.  Procedures Procedures (including critical care time)  Medications Ordered in ED Medications - No data to display   Initial Impression / Assessment and Plan / ED Course  I have reviewed the triage vital signs and the nursing notes.  Pertinent labs & imaging results that were available during my care of the patient were reviewed by me and considered in my medical decision making (see chart for details).       Final Clinical Impressions(s) / ED Diagnoses MDM  Vital signs within normal limits.  Pulse oximetry is 100% on room air.  Within normal limits by my interpretation.  Patient has pain with range of motion at the upper trapezius area extending into the right shoulder.  No hot areas appreciated no evidence of any dislocation, and no neurovascular deficit of the upper extremities.  Patient reports increased urine urgency and foul-smelling urine.  The urine analysis reveals a moderate leukocyte esterase, positive nitrates, 6-10 white blood cells per high-powered field and greater than 50 white blood cells by high-powered field there is also noted many bacteria present.  I have asked the patient to continue her naproxen.  We will add Flexeril.  No nausea/vomiting or unusual back pain.  Doubt pyelonephritis.  Patient does have a urinary tract infection.  Urine pregnancy test is negative.  No high fevers reported.  No recent injury or trauma to the renal area or urinary system.  The patient's urine will be cultured.  Patient will  be started on Keflex.  We will also obtain GC chlamydia studies from the urine tests.   Final diagnoses:  Lower urinary tract  infectious disease  Acute pain of right shoulder    ED Discharge Orders         Ordered    cephALEXin (KEFLEX) 500 MG capsule  4 times daily     01/06/18 0059    cyclobenzaprine (FLEXERIL) 10 MG tablet  3 times daily     01/06/18 0059    traMADol (ULTRAM) 50 MG tablet     01/06/18 0059           Ivery Quale, PA-C 01/06/18 1610    Glynn Octave, MD 01/06/18 0200

## 2018-01-08 LAB — URINE CULTURE: SPECIAL REQUESTS: NORMAL

## 2018-01-14 ENCOUNTER — Emergency Department (HOSPITAL_COMMUNITY)
Admission: EM | Admit: 2018-01-14 | Discharge: 2018-01-14 | Disposition: A | Discharge status: Home / Self Care | Payer: Medicaid Other | Attending: Emergency Medicine | Admitting: Emergency Medicine

## 2018-01-14 ENCOUNTER — Encounter (HOSPITAL_COMMUNITY): Payer: Self-pay | Admitting: Emergency Medicine

## 2018-01-14 ENCOUNTER — Emergency Department (HOSPITAL_COMMUNITY): Payer: Medicaid Other

## 2018-01-14 ENCOUNTER — Other Ambulatory Visit: Payer: Self-pay

## 2018-01-14 DIAGNOSIS — Z91040 Latex allergy status: Secondary | ICD-10-CM | POA: Diagnosis not present

## 2018-01-14 DIAGNOSIS — Z87891 Personal history of nicotine dependence: Secondary | ICD-10-CM | POA: Diagnosis not present

## 2018-01-14 DIAGNOSIS — Z79899 Other long term (current) drug therapy: Secondary | ICD-10-CM | POA: Insufficient documentation

## 2018-01-14 DIAGNOSIS — M25511 Pain in right shoulder: Secondary | ICD-10-CM

## 2018-01-14 DIAGNOSIS — E119 Type 2 diabetes mellitus without complications: Secondary | ICD-10-CM | POA: Insufficient documentation

## 2018-01-14 LAB — POC URINE PREG, ED: PREG TEST UR: NEGATIVE

## 2018-01-14 MED ORDER — LIDOCAINE 5 % EX PTCH: 1.0000 | MEDICATED_PATCH | CUTANEOUS | 0 refills | Status: DC

## 2018-01-14 MED ORDER — METHOCARBAMOL 500 MG PO TABS
750.0000 mg | ORAL_TABLET | Freq: Once | ORAL | Status: AC
  Administered 2018-01-14: 750 mg via ORAL
  Filled 2018-01-14: qty 2

## 2018-01-14 MED ORDER — METHOCARBAMOL 750 MG PO TABS: 750.0000 mg | ORAL_TABLET | Freq: Four times a day (QID) | ORAL | 0 refills | Status: DC

## 2018-01-14 NOTE — ED Provider Notes (Signed)
[facility] EMERGENCY DEPARTMENT Provider Note   CSN: [id number] Arrival date & time: 01/14/18  1550     History   Chief Complaint Chief Complaint  Patient presents with  . Shoulder Pain    HPI Joel L Rohm is a 32 y.o. female with a history of chronic back pain, well-controlled diabetes, reporting last CBG was 124 and treated for MS presenting with persistent right shoulder pain.  She was seen here for the same complaint a week ago, at which time it was felt she had possible muscle strain or spasm.  She simply woke with pain, stating she felt that she "slept wrong".  She denies any injuries to this shoulder.  She is right-handed, denies weakness or numbness and no radiation of pain into her lower arm or hand.  She does have a history of neuropathy and is on gabapentin for this condition.  Her pain is worsened with movement and certain positions.  She was treated with Flexeril which she states helped some but not completely and is out of this medication.  She was also placed on a course of Naprosyn which was also not completely effective.  The history is provided by the patient.    Past Medical History:  Diagnosis Date  . Anxiety   . Chronic back pain   . Depression   . Diabetes mellitus without complication (HCC)   . Edema   . MS (multiple sclerosis) (HCC) 09/29/2014    Patient Active Problem List   Diagnosis Date Noted  . Left leg weakness 11/07/2017  . Leg numbness 11/07/2017  . Other fatigue 11/07/2017  . Acute appendicitis 05/12/2015  . MS (multiple sclerosis) (HCC) 09/29/2014  . Positive ANA (antinuclear antibody) 07/02/2014  . Abnormal MRI of head 04/22/2014  . Transient diplopia 04/22/2014  . 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 Spell of memory loss 04/22/2014  . Recurrent falls while walking 04/22/2014    Past Surgical History:  Procedure Laterality Date  . APPENDECTOMY    . CHOLECYSTECTOMY N/A 10/07/2013   Procedure: LAPAROSCOPIC CHOLECYSTECTOMY;  Surgeon: Dalia Heading, MD;  Location: [facility];  Service: General;  Laterality: N/A;  . LAPAROSCOPIC APPENDECTOMY N/A 05/12/2015   Procedure: APPENDECTOMY LAPAROSCOPIC;  Surgeon: Franky Macho, MD;  Location: [facility];  Service: General;  Laterality: N/A;  . WISDOM TOOTH EXTRACTION       OB History    Gravida  2   Para      Term      Preterm      AB  2   Living  0     SAB  1   TAB      Ectopic  1   Multiple      Live Births               Home Medications    Prior to Admission medications   Medication Sig Start Date End Date Taking? Authorizing Provider  ALPRAZolam Prudy Feeler) 0.5 MG tablet Take 0.5 mg by mouth daily as needed. 06/27/17   [provider]  amitriptyline (ELAVIL) 25 MG tablet Take 25 mg by mouth at bedtime as needed. for sleep 05/19/17   [provider]  calcium-vitamin D 250-100 MG-UNIT tablet Take 1 tablet by mouth 2 (two) times daily. 04/24/17   Dohmeier, Porfirio Mylar, MD  cephALEXin (KEFLEX) 500 MG capsule Take 1 capsule (500 mg total) by mouth 4 (four) times daily. 01/06/18   Ivery Quale, PA-C  cetirizine (ZYRTEC) 10 MG tablet Take 10 mg by  mouth daily as needed. 03/10/17   [provider]  diclofenac (VOLTAREN) 75 MG EC tablet Take 75 mg by mouth 2 (two) times daily as needed. for pain 06/15/17   [provider]  Dimethyl Fumarate (TECFIDERA) 120 & 240 MG MISC Take by mouth.    [provider]  Dimethyl Fumarate (TECFIDERA) 240 MG CPDR Take 240 mg by mouth 2 (two) times daily.    [provider]  ferrous sulfate 325 (65 FE) MG tablet Take 1 tablet (325 mg total) by mouth every morning. 04/24/17   Dohmeier, Porfirio Mylar, MD  fluticasone (FLONASE) 50 MCG/ACT nasal spray Place 1 spray into both nostrils daily as needed for allergies or rhinitis.    [provider]  gabapentin (NEURONTIN) 300 MG capsule Take 2 capsules (600 mg total) by mouth 3 (three) times daily. 11/07/17   Sater, Pearletha Furl, MD  methocarbamol (ROBAXIN-750) 750  MG tablet Take 1 tablet (750 mg total) by mouth 4 (four) times daily. 01/14/18   Burgess Amor, PA-C  Multiple Vitamin (MULTIVITAMIN) tablet Take 1 tablet by mouth daily.    [provider]  naproxen (NAPROSYN) 500 MG tablet TAKE 1 TABLET (500 MG TOTAL) BY MOUTH 2 (TWO) TIMES DAILY WITH A MEAL. 11/08/17   Darreld Mclean, MD  phentermine (ADIPEX-P) 37.5 MG tablet Take 37.5 mg by mouth every morning. 02/23/17   [provider]  traMADol Janean Sark) 50 MG tablet 1-2 po q6h prn pain 01/06/18   Ivery Quale, PA-C    Family History Family History  Problem Relation Age of Onset  . Diabetes Mother   . Colon polyps Mother        hx of cancer  . Other Mother        DDD Lumber, cervical  . Sleep apnea Father     Social History Social History   Tobacco Use  . Smoking status: Former Smoker    Last attempt to quit: 09/16/2007    Years since quitting: 10.3  . Smokeless tobacco: Never Used  Substance Use Topics  . Alcohol use: No    Alcohol/week: 0.0 standard drinks  . Drug use: No    Types: Marijuana    Comment: no drug use since 2010     Allergies   Lexapro [escitalopram oxalate]; Tysabri [natalizumab]; and Latex   Review of Systems Review of Systems  Constitutional: Negative for fever.  Musculoskeletal: Positive for arthralgias. Negative for joint swelling and myalgias.  Neurological: Negative for weakness and numbness.     Physical Exam Updated Vital Signs BP (!) 145/67 (BP Location: Left Arm)   Pulse 100   Temp 98.3 F (36.8 C) (Oral)   Resp 17   Ht 5\' 4"  (1.626 m)   Wt (!) 154.2 kg   LMP 12/22/2017   SpO2 100%   BMI 58.36 kg/m   Physical Exam  Constitutional: She appears well-developed and well-nourished.  HENT:  Head: Atraumatic.  Neck: Normal range of motion.  Cardiovascular:  Pulses equal bilaterally  Musculoskeletal: She exhibits tenderness.       Right shoulder: She exhibits tenderness. She exhibits no bony tenderness, no swelling, no  effusion, no crepitus, no deformity, normal pulse and normal strength.  Tender to palpation across her right deltoid to the deltoid and insertion on her proximal lateral humerus.  No rash, lesions, edema or erythema at this location.  She has full range of motion both active and passive, pain with both but more painful with active.  No crepitus, no effusion.  Neurological: She is alert. She has normal strength. She displays normal reflexes. No sensory deficit.  Skin: Skin is warm and dry.  Psychiatric: She has a normal mood and affect.     ED Treatments / Results  Labs (all labs ordered are listed, but only abnormal results are displayed) Labs Reviewed  POC URINE PREG, ED    EKG None  Radiology Dg Shoulder Right  Result Date: 01/14/2018 CLINICAL DATA:  Right shoulder pain EXAM: RIGHT SHOULDER - 2+ VIEW COMPARISON:  02/08/2017 FINDINGS: There is no evidence of fracture or dislocation. There is no evidence of arthropathy or other focal bone abnormality. Soft tissues are unremarkable. IMPRESSION: Negative. Electronically Signed   By: Gaylyn Rong M.D.   On: 01/14/2018 17:59    Procedures Procedures (including critical care time)  Medications Ordered in ED Medications  methocarbamol (ROBAXIN) tablet 750 mg (has no administration in time range)     Initial Impression / Assessment and Plan / ED Course  I have reviewed the triage vital signs and the nursing notes.  Pertinent labs & imaging results that were available during my care of the patient were reviewed by me and considered in my medical decision making (see chart for details).     Imaging reviewed and discussed with patient, normal x-rays, no effusion, fractures, degenerative changes.  Patient has no shortness of breath, no chest pain.  Pain is reproducible and worse with movement suggesting an orthopedic source of pain.  No rash to suggest shingles neuropathy.  She was encouraged to continue with heat therapy, she was  switched to Robaxin, also encouraged she may continue with IcyHot, but to avoid combining this treatment with heat.  She was encouraged to follow-up with her PCP for recheck of her shoulder pain if her symptoms persist.  Final Clinical Impressions(s) / ED Diagnoses   Final diagnoses:  Acute pain of right shoulder    ED Discharge Orders         Ordered    methocarbamol (ROBAXIN-750) 750 MG tablet  4 times daily     01/14/18 1807    lidocaine (LIDODERM) 5 %  Every 24 hours     01/14/18 1826           Burgess Amor, PA-C 01/14/18 1827    Bethann Berkshire, MD 01/14/18 2256

## 2018-01-14 NOTE — Discharge Instructions (Addendum)
Your x-rays are negative for any acute injuries, arthritis or bony deformity.  You have been prescribed a stronger muscle relaxer to try in place of the Flexeril, also you may get pain relief with the Lidoderm patch which was also prescribed.  You may continue using a heating pad 20 minutes several times daily would be helpful.  Plan to see your doctor for recheck if your symptoms are not improving over the next week.

## 2018-01-14 NOTE — ED Triage Notes (Signed)
Pt c/o right shoulder pain x 1-2 weeks, pt was seen here for same and has finished all Flexeril and reports continued pain with no relief, has also been taking Ibuprofen and applying heat, denies injury

## 2018-01-17 ENCOUNTER — Telehealth (HOSPITAL_COMMUNITY): Payer: Self-pay | Admitting: Emergency Medicine

## 2018-01-17 MED ORDER — METHOCARBAMOL 750 MG PO TABS: 750.0000 mg | ORAL_TABLET | Freq: Four times a day (QID) | ORAL | 0 refills | Status: DC

## 2018-01-17 NOTE — Telephone Encounter (Signed)
Pt presented to ed stating did not receive her robaxin script at recent visit. Attempt to e scribe unsuccessful. Call placed to pharmacy with new order.

## 2018-02-05 ENCOUNTER — Ambulatory Visit (HOSPITAL_COMMUNITY): Payer: Medicaid Other

## 2018-02-05 DIAGNOSIS — Z0289 Encounter for other administrative examinations: Secondary | ICD-10-CM

## 2018-02-07 ENCOUNTER — Encounter: Payer: Self-pay | Admitting: Neurology

## 2018-02-07 ENCOUNTER — Other Ambulatory Visit: Payer: Self-pay

## 2018-02-07 ENCOUNTER — Ambulatory Visit (INDEPENDENT_AMBULATORY_CARE_PROVIDER_SITE_OTHER): Payer: Medicaid Other | Admitting: Neurology

## 2018-02-07 VITALS — BP 116/64 | HR 82 | Resp 20 | Ht 64.0 in | Wt 351.5 lb

## 2018-02-07 DIAGNOSIS — R29898 Other symptoms and signs involving the musculoskeletal system: Secondary | ICD-10-CM | POA: Diagnosis not present

## 2018-02-07 DIAGNOSIS — R5383 Other fatigue: Secondary | ICD-10-CM

## 2018-02-07 DIAGNOSIS — G35 Multiple sclerosis: Secondary | ICD-10-CM | POA: Diagnosis not present

## 2018-02-07 DIAGNOSIS — R269 Unspecified abnormalities of gait and mobility: Secondary | ICD-10-CM

## 2018-02-07 DIAGNOSIS — R270 Ataxia, unspecified: Secondary | ICD-10-CM

## 2018-02-07 DIAGNOSIS — R296 Repeated falls: Secondary | ICD-10-CM

## 2018-02-07 MED ORDER — DALFAMPRIDINE ER 10 MG PO TB12: ORAL_TABLET | ORAL | 11 refills | Status: DC

## 2018-02-07 MED ORDER — AMPHETAMINE-DEXTROAMPHET ER 30 MG PO CP24: 30.0000 mg | ORAL_CAPSULE | Freq: Every day | ORAL | 0 refills | Status: DC

## 2018-02-07 NOTE — Progress Notes (Signed)
[facility]  PATIENT: Brooke [name] DOB: 1985-05-18  REFERRING DOCTOR OR PCP:  Dr. Vickey Huger; PCP is Fleet Contras SOURCE: Patient, notes from Dr. Vickey Huger, imaging and laboratory reports, MRI images on PACS.  _________________________________   HISTORICAL  CHIEF COMPLAINT:  Chief Complaint  Patient presents with  . Multiple Sclerosis    Rm. 13.  She reports compliance with Tecfidera; sts. sx. are no worse, and no new sx., but she doesn't feel balance is imporving, and is still fatigued. Frequent falls.  Last fall 3 days ago. Sts. she turned her head to speak to someone and lost her balance, fell.  Denies dizziness/fim    HISTORY OF PRESENT ILLNESS:  Brooke [name] is a 32 year old woman with multiple sclerosis followed by Dr. Vickey Huger.  She is currently on Ocrevus.   Despite having stable MRIs, she has noted some continued progression of her MS related symptoms.  Update 02/07/2018: She had an allergic reaction to her Tysabri infusion and felt that she progressed while on Ocrevus.  Therefore, she was to go back on Tecfidera.   She is tolerating it well and has no GI or flushing issues.   Her gait is poor due to poor balance.    She fell on Monday due to her left leg weakness and it giving out.    She has a lot of fatigue.   Phentermine did not help the energy much but she lost a couple pounds.    She sleeps well most nights.  She denies depression.  Update 11/07/2017: She tolerated her first Tysabri infusion well.  However, she had an allergic reaction with her second Tysabri infusion.  She broke out in hives and had some shortness of breath.   She received Benadryl and had improvement.  However, she has felt much more tired.  She also feels weaker and her left leg feels more numb..      A higher dose of gabapentin 600 mg po tid is helping.    She was initially placed on Tecfidera but wanted to switch after a year and a half to Ocrevus and stayed on that for  2-3 cycles.     She felt she progressed on Ocrevus.     Of note, she was very concerned about the length of time between Ocrevus infusions even though the mechanism of action have been explained to her.  She was more comfortable with an every month infusion prompting the switch to Tysabri.  We had a long conversation today about the various treatment options.  She would be most comfortable with going back on Tecfidera and, she feels in retrospect, that she was stable while on it.  She would also be interested in trying Mayzent or Gilenya if she does not do well when she switches back to Tecfidera.  We also discussed today that the main purpose of a disease modifying therapy is to prevent new relapses and slowing down progression.  Most of the time people do not feel significantly better on a disease modifying therapy but it alters the disease course.  She feels her fatigue has done worse.  She gets tired with stress short burst of effort.  She continues to note left greater than right leg weakness and numbness.  Her gait is off balance and she follows a couple times a week.  From 08/08/2017: She was diagnosed with MS in 2016 after presenting with visual disturbance, diplopia and poor gait.    She was initially placed on Tecfidera.  She did not note any benefit so wanted to switch medications.   She felt she is doing no better with the Ocrevus and wonders what medication would be best for her.     Currently, she has left > right leg weakness.   She denies spasticity.   Gait is off balanced and she falls every week or so.     She has numbness in the left leg.    Leg numbness is fairly constant and she gets occasional left hand numbness.     She has had urgency and some incontinence with urine x one year.    She has rare bowel incontinence.      She notes a lot of fatigue and even has to take a break to change her sheets or other chores.    She feels sleepy many days.  She gets 6 hours most nights.  Amitriptyline helps her fall asleep.    She was once on phentermine and it may have helped her fatigue slightly.   However, insurance will not cover it.     She has been told that she snores some.   She has never been told that she has pauses or snorts/gasps.    She had a sleep study and did not have OSA.     However,  she has gained some weight.   She notes depression and anxiety.   She has had some panic attacks.     She feels her cognition is mildly impaired.   She has occasional difficulty with memory.      MRI's 05/13/2016 and 05/16/2016 were personally reviewed and I concur with the readings.:  IMPRESSION:  This MRI of the brain with and without contrast shows the following: 1.   Multiple T2/FLAIR hyperintense foci in the pons and hemispheres in a pattern and configuration consistent with chronic demyelinating plaque associated with multiple sclerosis. None of the foci appears to be acute. When compared to the MRI dated 01/11/2015, there is no interval change. 2.    There is a normal enhancement pattern and there are no acute findings.   IMPRESSION:  This MRI of the cervical spine with and without contrast shows the following: 1.    Foci within the posterior spinal cord adjacent to C3 and the left spinal cord adjacent to C5.   Neither of these appear to be acute. 2.    Minimal disc degenerative changes at C5-C6 and C6-C7 that did not lead to any nerve root impingement. 3.    There is a normal enhancement pattern and there are no acute findings.  IMPRESSION:  This MRI of the thoracic spine with and without contrast shows the following: 1.   There is a focus within the left anterior spinal cord adjacent to T8-T9 consistent with a demyelinating plaque associated with multiple sclerosis. It appears to be chronic and does not enhance. There is a small focus to the right adjacent to T3-T4.   It is noted on the axial images but not the sagittal images and could represent a small chronic demyelinating  plaque or be artifactual. 2.   There is small right lateral disc protrusion at T11-T12. There is no nerve root compression.. 3.    There is a normal enhancement pattern.   REVIEW OF SYSTEMS: Constitutional: No fevers, chills, sweats, or change in appetite.  She notes fatigue. Eyes: No visual changes, double vision, eye pain Ear, nose and throat: No hearing loss, ear pain, nasal congestion, sore throat Cardiovascular: No  chest pain, palpitations Respiratory: No shortness of breath at rest or with exertion.   No wheezes GastrointestinaI: No nausea, vomiting, diarrhea, abdominal pain, fecal incontinence Genitourinary: No dysuria, urinary retention or frequency.  No nocturia. Musculoskeletal: No neck pain, back pain Integumentary: No rash, pruritus, skin lesions Neurological: as above Psychiatric: She denies depression or anxiety.   Endocrine: No palpitations, diaphoresis, change in appetite, change in weigh or increased thirst Hematologic/Lymphatic: No anemia, purpura, petechiae. Allergic/Immunologic: No itchy/runny eyes, nasal congestion, recent allergic reactions, rashes  ALLERGIES: Allergies  Allergen Reactions  . Lexapro [Escitalopram Oxalate] Other (See Comments)    Suicidal thoughts, lowered libido  . Tysabri [Natalizumab] Hives    Hives, itching, elevated HR and BP.  [name] Latex Hives    HOME MEDICATIONS:  Current Outpatient Medications:  .  ALPRAZolam (XANAX) 0.5 MG tablet, Take 0.5 mg by mouth daily as needed., Disp: , Rfl: 2 .  amitriptyline (ELAVIL) 25 MG tablet, Take 25 mg by mouth at bedtime as needed. for sleep, Disp: , Rfl: 2 .  calcium-vitamin D 250-100 MG-UNIT tablet, Take 1 tablet by mouth 2 (two) times daily., Disp: 60 tablet, Rfl: 5 .  cetirizine (ZYRTEC) 10 MG tablet, Take 10 mg by mouth daily as needed., Disp: , Rfl: 5 .  diclofenac (VOLTAREN) 75 MG EC tablet, Take 75 mg by mouth 2 (two) times daily as needed. for pain, Disp: , Rfl: 2 .  Dimethyl Fumarate  (TECFIDERA) 240 MG CPDR, Take 240 mg by mouth 2 (two) times daily., Disp: , Rfl:  .  ferrous sulfate 325 (65 FE) MG tablet, Take 1 tablet (325 mg total) by mouth every morning., Disp: 90 tablet, Rfl: 3 .  fluticasone (FLONASE) 50 MCG/ACT nasal spray, Place 1 spray into both nostrils daily as needed for allergies or rhinitis., Disp: , Rfl:  .  gabapentin (NEURONTIN) 300 MG capsule, Take 2 capsules (600 mg total) by mouth 3 (three) times daily., Disp: 180 capsule, Rfl: 5 .  methocarbamol (ROBAXIN-750) 750 MG tablet, Take 1 tablet (750 mg total) by mouth 4 (four) times daily., Disp: 30 tablet, Rfl: 0 .  Multiple Vitamin (MULTIVITAMIN) tablet, Take 1 tablet by mouth daily., Disp: , Rfl:  .  naproxen (NAPROSYN) 500 MG tablet, TAKE 1 TABLET (500 MG TOTAL) BY MOUTH 2 (TWO) TIMES DAILY WITH A MEAL., Disp: 60 tablet, Rfl: 5 .  tiZANidine (ZANAFLEX) 4 MG tablet, Take 4 mg by mouth 2 (two) times daily as needed., Disp: , Rfl: 2 .  amphetamine-dextroamphetamine (ADDERALL XR) 30 MG 24 hr capsule, Take 1 capsule (30 mg total) by mouth daily., Disp: 30 capsule, Rfl: 0 .  dalfampridine 10 MG TB12, One po bid, Disp: 60 tablet, Rfl: 11  PAST MEDICAL HISTORY: Past Medical History:  Diagnosis Date  . Anxiety   . Chronic back pain   . Depression   . Diabetes mellitus without complication (HCC)   . Edema   . MS (multiple sclerosis) (HCC) 09/29/2014    PAST SURGICAL HISTORY: Past Surgical History:  Procedure Laterality Date  . APPENDECTOMY    . CHOLECYSTECTOMY N/A 10/07/2013   Procedure: LAPAROSCOPIC CHOLECYSTECTOMY;  Surgeon: Dalia Heading, MD;  Location: [facility];  Service: General;  Laterality: N/A;  . LAPAROSCOPIC APPENDECTOMY N/A 05/12/2015   Procedure: APPENDECTOMY LAPAROSCOPIC;  Surgeon: Franky Macho, MD;  Location: [facility];  Service: General;  Laterality: N/A;  . WISDOM TOOTH EXTRACTION      FAMILY HISTORY: Family History  Problem Relation Age of Onset  . Diabetes  Mother   . Colon polyps Mother         hx of cancer  . Other Mother        DDD Lumber, cervical  . Sleep apnea Father     SOCIAL HISTORY:  Social History   Socioeconomic History  . Marital status: Single    Spouse name: Not on file  . Number of children: Not on file  . Years of education: 2 y colleg  . Highest education level: Not on file  Occupational History  . Occupation: work  Engineer, production  . Financial resource strain: Not on file  . Food insecurity:    Worry: Not on file    Inability: Not on file  . Transportation needs:    Medical: Not on file    Non-medical: Not on file  Tobacco Use  . Smoking status: Former Smoker    Last attempt to quit: 09/16/2007    Years since quitting: 10.4  . Smokeless tobacco: Never Used  Substance and Sexual Activity  . Alcohol use: No    Alcohol/week: 0.0 standard drinks  . Drug use: No    Types: Marijuana    Comment: no drug use since 2010  . Sexual activity: Never    Birth control/protection: None  Lifestyle  . Physical activity:    Days per week: Not on file    Minutes per session: Not on file  . Stress: Not on file  Relationships  . Social connections:    Talks on phone: Not on file    Gets together: Not on file    Attends religious service: Not on file    Active member of club or organization: Not on file    Attends meetings of clubs or organizations: Not on file    Relationship status: Not on file  . Intimate partner violence:    Fear of current or ex partner: Not on file    Emotionally abused: Not on file    Physically abused: Not on file    Forced sexual activity: Not on file  Other Topics Concern  . Not on file  Social History Narrative   Drinks 1-2 large glasses of caffeine daily.        PHYSICAL EXAM  Vitals:   02/07/18 1032  BP: 116/64  Pulse: 82  Resp: 20  Weight: (!) 351 lb 8 oz (159.4 kg)  Height: 5\' 4"  (1.626 m)    Body mass index is 60.33 kg/m.   General: The patient is well-developed and well-nourished and in no acute  distress   Skin: Extremities are without rash or edema.   Neurologic Exam  Mental status: The patient is alert and oriented x 3 at the time of the examination. The patient has apparent normal recent and remote memory, with an apparently normal attention span and concentration ability.   Speech is normal.  Cranial nerves: Extraocular movements are full.  Facial strength and sensation is normal.  Trapezius strength is normal.. No obvious hearing deficits are noted.  Motor:  Muscle bulk is normal.   Tone is mildly increased in the left leg.  Strength was 4/5 in the left leg.  Sensory: She has reduced sensation to touch and vibration in the left leg relative to the right.  Touch sensation was mildly reduced in the left arm relative to the right.  Coordination: Cerebellar testing reveals good finger-nose-finger and heel-to-shin bilaterally.  Gait and station: Station is normal.  The gait is wide and she has a  left foot drop.  She cannot do a tandem walk.  The Romberg is negative..   Reflexes: Deep tendon reflexes are symmetric and normal in the arms and increase at the knees..   25 foot timed walk (average of 2 trials) is 12.2 seconds    DIAGNOSTIC DATA (LABS, IMAGING, TESTING) - I reviewed patient records, labs, notes, testing and imaging myself where available.  Lab Results  Component Value Date   WBC 6.3 08/08/2017   HGB 12.7 08/08/2017   HCT 37.0 08/08/2017   MCV 85 08/08/2017   PLT 326 08/08/2017      Component Value Date/Time   NA 137 07/28/2017 0119   NA 138 04/24/2017 1028   K 3.2 (L) 07/28/2017 0119   CL 101 07/28/2017 0119   CO2 29 07/28/2017 0119   GLUCOSE 118 (H) 07/28/2017 0119   BUN 10 07/28/2017 0119   BUN 12 04/24/2017 1028   CREATININE 0.83 07/28/2017 0119   CALCIUM 8.9 07/28/2017 0119   PROT 7.7 07/28/2017 0119   PROT 7.2 04/24/2017 1028   ALBUMIN 3.5 07/28/2017 0119   ALBUMIN 3.8 04/24/2017 1028   AST 17 07/28/2017 0119   ALT 17 07/28/2017 0119    ALKPHOS 64 07/28/2017 0119   BILITOT 0.6 07/28/2017 0119   BILITOT 0.3 04/24/2017 1028   GFRNONAA >60 07/28/2017 0119   GFRAA >60 07/28/2017 0119       ASSESSMENT AND PLAN  MS (multiple sclerosis) (HCC) - Plan: CBC with Differential/Platelet  Left leg weakness  Ataxia  Other fatigue  Recurrent falls while walking  Gait disturbance  1.   Continue Tecfidera.  She is tolerating well.  If she has any breakthrough activity consider Mayzent or [place]avenclad or [place]  2.  Due to a combination of leg weakness, leg numbness, gait disturbance and fatigue, she is unable to work.  Her disability is unlikely to improve.   3.   Dalfampridine ER 10 mg twice daily for gait.  Adderall for attention deficit and fatigue 4. Return in 3-4 months or sooner if there are new or worsening neurologic symptoms.    40-minute face-to-face evaluation with greater than half the time counseling coordinating care about her MS, MS symptoms, symptomatic treatment and weight loss  Coni Homesley A. Epimenio Foot, MD, PhD, FAAN Certified in Neurology, Clinical Neurophysiology, Sleep Medicine, Pain Medicine and Neuroimaging Director, [facility]  [facility] [address], [address] [place], [place] [address] [phone]

## 2018-02-08 ENCOUNTER — Telehealth: Payer: Self-pay | Admitting: *Deleted

## 2018-02-08 LAB — CBC WITH DIFFERENTIAL/PLATELET
Basophils Absolute: 0 10*3/uL (ref 0.0–0.2)
Basos: 1 %
EOS (ABSOLUTE): 0.2 10*3/uL (ref 0.0–0.4)
EOS: 2 %
HEMATOCRIT: 38.2 % (ref 34.0–46.6)
Hemoglobin: 13 g/dL (ref 11.1–15.9)
Immature Grans (Abs): 0 10*3/uL (ref 0.0–0.1)
Immature Granulocytes: 0 %
Lymphocytes Absolute: 1.7 10*3/uL (ref 0.7–3.1)
Lymphs: 26 %
MCH: 28.8 pg (ref 26.6–33.0)
MCHC: 34 g/dL (ref 31.5–35.7)
MCV: 85 fL (ref 79–97)
MONOS ABS: 0.4 10*3/uL (ref 0.1–0.9)
Monocytes: 6 %
NEUTROS PCT: 65 %
Neutrophils Absolute: 4.2 10*3/uL (ref 1.4–7.0)
PLATELETS: 309 10*3/uL (ref 150–450)
RBC: 4.51 x10E6/uL (ref 3.77–5.28)
RDW: 13.4 % (ref 12.3–15.4)
WBC: 6.5 10*3/uL (ref 3.4–10.8)

## 2018-02-08 NOTE — Telephone Encounter (Signed)
Called and spoke with pt. Advised lab work is fine per Dr. Sater. She verbalized understanding. 

## 2018-02-08 NOTE — Telephone Encounter (Signed)
-----   Message from Asa Lenteichard A Sater, MD sent at 02/08/2018  1:08 PM EST ----- Please let the patient know that the lab work is fine. ki

## 2018-02-25 ENCOUNTER — Other Ambulatory Visit: Payer: Self-pay

## 2018-02-25 ENCOUNTER — Emergency Department (HOSPITAL_COMMUNITY): Payer: Medicaid Other

## 2018-02-25 ENCOUNTER — Emergency Department (HOSPITAL_COMMUNITY)
Admission: EM | Admit: 2018-02-25 | Discharge: 2018-02-25 | Disposition: A | Discharge status: Home / Self Care | Payer: Medicaid Other | Attending: Emergency Medicine | Admitting: Emergency Medicine

## 2018-02-25 ENCOUNTER — Encounter (HOSPITAL_COMMUNITY): Payer: Self-pay | Admitting: Emergency Medicine

## 2018-02-25 DIAGNOSIS — M25562 Pain in left knee: Secondary | ICD-10-CM | POA: Diagnosis present

## 2018-02-25 DIAGNOSIS — E119 Type 2 diabetes mellitus without complications: Secondary | ICD-10-CM | POA: Insufficient documentation

## 2018-02-25 DIAGNOSIS — Z87891 Personal history of nicotine dependence: Secondary | ICD-10-CM | POA: Diagnosis not present

## 2018-02-25 DIAGNOSIS — G35 Multiple sclerosis: Secondary | ICD-10-CM | POA: Diagnosis not present

## 2018-02-25 DIAGNOSIS — Z79899 Other long term (current) drug therapy: Secondary | ICD-10-CM | POA: Insufficient documentation

## 2018-02-25 LAB — BASIC METABOLIC PANEL
Anion gap: 4 — ABNORMAL LOW (ref 5–15)
BUN: 9 mg/dL (ref 6–20)
CO2: 30 mmol/L (ref 22–32)
CREATININE: 0.84 mg/dL (ref 0.44–1.00)
Calcium: 8.5 mg/dL — ABNORMAL LOW (ref 8.9–10.3)
Chloride: 102 mmol/L (ref 98–111)
GFR calc Af Amer: >60 mL/min (ref >60)
GFR calc non Af Amer: >60 mL/min (ref >60)
Glucose, Bld: 98 mg/dL (ref 70–99)
Potassium: 3.8 mmol/L (ref 3.5–5.1)
SODIUM: 136 mmol/L (ref 135–145)

## 2018-02-25 LAB — URINALYSIS, ROUTINE W REFLEX MICROSCOPIC
Bilirubin Urine: NEGATIVE
GLUCOSE, UA: NEGATIVE mg/dL
KETONES UR: NEGATIVE mg/dL
Nitrite: NEGATIVE
PROTEIN: NEGATIVE mg/dL
Specific Gravity, Urine: 1.009 (ref 1.005–1.030)
pH: 8 (ref 5.0–8.0)

## 2018-02-25 LAB — CBC
HCT: 37.1 % (ref 36.0–46.0)
Hemoglobin: 11.9 g/dL — ABNORMAL LOW (ref 12.0–15.0)
MCH: 28.3 pg (ref 26.0–34.0)
MCHC: 32.1 g/dL (ref 30.0–36.0)
MCV: 88.3 fL (ref 80.0–100.0)
Platelets: 271 10*3/uL (ref 150–400)
RBC: 4.2 MIL/uL (ref 3.87–5.11)
RDW: 13.9 % (ref 11.5–15.5)
WBC: 5.6 10*3/uL (ref 4.0–10.5)
nRBC: 0 % (ref 0.0–0.2)

## 2018-02-25 LAB — D-DIMER, QUANTITATIVE: D-Dimer, Quant: 0.29 ug/mL-FEU (ref 0.00–0.50)

## 2018-02-25 LAB — PREGNANCY, URINE: Preg Test, Ur: NEGATIVE

## 2018-02-25 MED ORDER — NAPROXEN 500 MG PO TABS: 500.0000 mg | ORAL_TABLET | Freq: Two times a day (BID) | ORAL | 0 refills | Status: DC

## 2018-02-25 NOTE — Discharge Instructions (Signed)
Take naproxen 500 mg twice daily for the next week until you can follow-up with the orthopedist.  Dr. Mort Sawyers phone number is above, call in the morning to make an appointment for the next week.  Seek medical exam for severe or worsening redness, swelling, fever or increasing pain.

## 2018-02-25 NOTE — ED Triage Notes (Signed)
Pt also complaining of foul smelling urine but denies pain or discharge.

## 2018-02-25 NOTE — ED Provider Notes (Signed)
[name] Provider Note   CSN: [id number] Arrival date & time: 02/25/18  1529     History   Chief Complaint Chief Complaint  Patient presents with  . Leg Pain    HPI Brooke [name] is a 33 y.o. female.  HPI  33 y/o female - she has a hx of chronic back pain and MS - she has been on 3 different MS meds in the past and feels like they don't help - she has ongoing sx of intermittent numbness in arms and legs as well as some intermittent blurred vision - this is not the reason for her visit today - she has a CC of Left sided nee pain and feels like her knee is going to buckle on her when she walks.  She has not had any injuries to this knee but has noted that this is gradually worsening over the last couple of weeks, it is persistent, worse with walking and associated with some swelling of the leg all the way to the foot - she has no DM, no fevers, no ttp of the leg.  No meds for this or prior evaluation for this in the past.  Past Medical History:  Diagnosis Date  . Anxiety   . Chronic back pain   . Depression   . Diabetes mellitus without complication (HCC)   . Edema   . MS (multiple sclerosis) (HCC) 09/29/2014    Patient Active Problem List   Diagnosis Date Noted  . Gait disturbance 02/07/2018  . Left leg weakness 11/07/2017  . Leg numbness 11/07/2017  . Other fatigue 11/07/2017  . Acute appendicitis 05/12/2015  . MS (multiple sclerosis) (HCC) 09/29/2014  . Positive ANA (antinuclear antibody) 07/02/2014  . Abnormal MRI of head 04/22/2014  . Transient diplopia 04/22/2014  . 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 Spell of memory loss 04/22/2014  . Recurrent falls while walking 04/22/2014    Past Surgical History:  Procedure Laterality Date  . APPENDECTOMY    . CHOLECYSTECTOMY N/A 10/07/2013   Procedure: LAPAROSCOPIC CHOLECYSTECTOMY;  Surgeon: Dalia HeadingMark A Jenkins, MD;  Location: [facility];  Service: General;  Laterality: N/A;  . LAPAROSCOPIC APPENDECTOMY N/A 05/12/2015   Procedure: APPENDECTOMY LAPAROSCOPIC;  Surgeon: Franky MachoMark Jenkins, MD;  Location: [facility];  Service: General;  Laterality: N/A;  . WISDOM TOOTH EXTRACTION       OB History    Gravida  2   Para      Term      Preterm      AB  2   Living  0     SAB  1   TAB      Ectopic  1   Multiple      Live Births               Home Medications    Prior to Admission medications   Medication Sig Start Date End Date Taking? Authorizing Provider  ALPRAZolam Prudy Feeler(XANAX) 0.5 MG tablet Take 0.5 mg by mouth daily as needed. 06/27/17   [provider]  amitriptyline (ELAVIL) 25 MG tablet Take 25 mg by mouth at bedtime as needed. for sleep 05/19/17   [provider]  amphetamine-dextroamphetamine (ADDERALL XR) 30 MG 24 hr capsule Take 1 capsule (30 mg total) by mouth daily. 02/07/18   Sater, Pearletha Furlichard A, MD  calcium-vitamin D 250-100 MG-UNIT tablet Take 1 tablet by mouth 2 (two) times daily. 04/24/17   Dohmeier, Porfirio Mylararmen, MD  cetirizine (ZYRTEC) 10 MG tablet Take 10  mg by mouth daily as needed. 03/10/17   [provider]  dalfampridine 10 MG TB12 One po bid 02/07/18   Sater, Pearletha Furl, MD  diclofenac (VOLTAREN) 75 MG EC tablet Take 75 mg by mouth 2 (two) times daily as needed. for pain 06/15/17   [provider]  Dimethyl Fumarate (TECFIDERA) 240 MG CPDR Take 240 mg by mouth 2 (two) times daily.    [provider]  ferrous sulfate 325 (65 FE) MG tablet Take 1 tablet (325 mg total) by mouth every morning. 04/24/17   Dohmeier, Porfirio Mylar, MD  fluticasone (FLONASE) 50 MCG/ACT nasal spray Place 1 spray into both nostrils daily as needed for allergies or rhinitis.    [provider]  gabapentin (NEURONTIN) 300 MG capsule Take 2 capsules (600 mg total) by mouth 3 (three) times daily. 11/07/17   Sater, Pearletha Furl, MD  methocarbamol (ROBAXIN-750) 750 MG tablet Take 1 tablet (750 mg total) by mouth 4 (four) times daily. 01/17/18   Burgess Amor, PA-C  Multiple Vitamin  (MULTIVITAMIN) tablet Take 1 tablet by mouth daily.    [provider]  naproxen (NAPROSYN) 500 MG tablet TAKE 1 TABLET (500 MG TOTAL) BY MOUTH 2 (TWO) TIMES DAILY WITH A MEAL. 11/08/17   Darreld Mclean, MD  tiZANidine (ZANAFLEX) 4 MG tablet Take 4 mg by mouth 2 (two) times daily as needed. 03/06/17   [provider]    Family History Family History  Problem Relation Age of Onset  . Diabetes Mother   . Colon polyps Mother        hx of cancer  . Other Mother        DDD Lumber, cervical  . Sleep apnea Father     Social History Social History   Tobacco Use  . Smoking status: Former Smoker    Last attempt to quit: 09/16/2007    Years since quitting: 10.4  . Smokeless tobacco: Never Used  Substance Use Topics  . Alcohol use: No    Alcohol/week: 0.0 standard drinks  . Drug use: No    Types: Marijuana    Comment: no drug use since 2010     Allergies   Lexapro [escitalopram oxalate]; Tysabri [natalizumab]; and Latex   Review of Systems Review of Systems  Constitutional: Negative for chills and fever.  Eyes: Positive for visual disturbance ( intermittent blurred vision over time).  Respiratory: Negative for cough and shortness of breath.   Cardiovascular: Positive for leg swelling. Negative for chest pain.  Gastrointestinal: Negative for abdominal pain.  Genitourinary:       "strong smelling urine"  Musculoskeletal: Positive for gait problem. Negative for neck pain.  Skin: Negative for rash and wound.  Neurological: Positive for numbness. Negative for weakness.  Hematological: Does not bruise/bleed easily.     Physical Exam Updated Vital Signs BP 134/87   Pulse 79   Temp 97.9 F (36.6 C) (Oral)   Resp 18   Ht 1.626 m (5\' 4" )   Wt (!) 159.2 kg   LMP 02/20/2018   SpO2 100%   BMI 60.25 kg/m   Physical Exam Vitals signs and nursing note reviewed.  Constitutional:      General: She is not in acute distress.    Appearance: She is well-developed.    HENT:     Head: Normocephalic and atraumatic.     Mouth/Throat:     Pharynx: No oropharyngeal exudate.  Eyes:     General: No scleral icterus.  Right eye: No discharge.        Left eye: No discharge.     Conjunctiva/sclera: Conjunctivae normal.     Pupils: Pupils are equal, round, and reactive to light.  Neck:     Musculoskeletal: Normal range of motion and neck supple.     Thyroid: No thyromegaly.     Vascular: No JVD.  Cardiovascular:     Rate and Rhythm: Normal rate and regular rhythm.     Heart sounds: Normal heart sounds. No murmur. No friction rub. No gallop.   Pulmonary:     Effort: Pulmonary effort is normal. No respiratory distress.     Breath sounds: Normal breath sounds. No wheezing or rales.  Abdominal:     General: Bowel sounds are normal. There is no distension.     Palpations: Abdomen is soft. There is no mass.     Tenderness: There is no abdominal tenderness.  Musculoskeletal: Normal range of motion.        General: No tenderness.     Comments: Obese, ambulates with slight antalgic gait due to pain in the L knee.  Has mild edema to the bilateral LE's which seems symmetrical, has good ROM of the L knee - supple - but has some pain with this as well as some pain with manipulation of the LLE at the knee - no hip or ankle pain and no redness or warmth of the skin.  All other ext without pain or deformity  Lymphadenopathy:     Cervical: No cervical adenopathy.  Skin:    General: Skin is warm and dry.     Findings: No erythema or rash.  Neurological:     Mental Status: She is alert.     Coordination: Coordination normal.     Comments: Speech is clear, coordination is normal Memory is intact, gait is slightly antalgic second to pain Normal strength in extending the knees and ankles / feet - normal strength in arms, no facial droop.  Psychiatric:        Behavior: Behavior normal.      ED Treatments / Results  Labs (all labs ordered are listed, but only  abnormal results are displayed) Labs Reviewed  URINALYSIS, ROUTINE W REFLEX MICROSCOPIC    EKG None  Radiology No results found.  Procedures Procedures (including critical care time)  Medications Ordered in ED Medications - No data to display   Initial Impression / Assessment and Plan / ED Course  I have reviewed the triage vital signs and the nursing notes.  Pertinent labs & imaging results that were available during my care of the patient were reviewed by me and considered in my medical decision making (see chart for details).  Clinical Course as of Feb 26 1748  Sun Feb 25, 2018  1730 X-ray of the knee appears normal, urinalysis is negative for obvious infection, CBC shows no leukocytosis or anemia and the d-dimer is negative suggesting that this is not a blood clot.  Patient updated.  She will be given an anti-inflammatory and asked to follow-up with her local doctor.  At this time given the supple nature of her knee and the prolonged course of > 1 weeks of symptoms I do not think she needs to have arthrocentesis.  Will refer to orthopedics   [BM]    Clinical Course User Index [BM] Eber Hong, MD   The pt will get xray of the knee, d dimer, possible inflammatory arthritis, gout - less likely to be septic  arthritis - has no fever or tachycardia.    Final Clinical Impressions(s) / ED Diagnoses   Final diagnoses:  Left knee pain, unspecified chronicity    ED Discharge Orders    None       Eber HongMiller, Lynsie Mcwatters, MD 02/25/18 1751

## 2018-02-25 NOTE — ED Triage Notes (Signed)
Pt states left leg pain/swelling for one week. Worse over past three days. Pt reports pain at knee cap. Denies injury. Pt reports does history of MS.

## 2018-04-04 ENCOUNTER — Other Ambulatory Visit: Payer: Self-pay

## 2018-04-04 ENCOUNTER — Telehealth: Payer: Self-pay | Admitting: Neurology

## 2018-04-04 ENCOUNTER — Encounter (HOSPITAL_COMMUNITY): Payer: Self-pay | Admitting: Emergency Medicine

## 2018-04-04 ENCOUNTER — Emergency Department (HOSPITAL_COMMUNITY): Payer: Medicaid Other

## 2018-04-04 ENCOUNTER — Encounter: Payer: Self-pay | Admitting: *Deleted

## 2018-04-04 ENCOUNTER — Emergency Department (HOSPITAL_COMMUNITY)
Admission: EM | Admit: 2018-04-04 | Discharge: 2018-04-04 | Disposition: A | Discharge status: Home / Self Care | Payer: Medicaid Other | Attending: Emergency Medicine | Admitting: Emergency Medicine

## 2018-04-04 DIAGNOSIS — Z91040 Latex allergy status: Secondary | ICD-10-CM | POA: Diagnosis not present

## 2018-04-04 DIAGNOSIS — Z79899 Other long term (current) drug therapy: Secondary | ICD-10-CM | POA: Diagnosis not present

## 2018-04-04 DIAGNOSIS — M25562 Pain in left knee: Secondary | ICD-10-CM | POA: Insufficient documentation

## 2018-04-04 DIAGNOSIS — M79605 Pain in left leg: Secondary | ICD-10-CM | POA: Diagnosis present

## 2018-04-04 DIAGNOSIS — Z87891 Personal history of nicotine dependence: Secondary | ICD-10-CM | POA: Diagnosis not present

## 2018-04-04 DIAGNOSIS — E119 Type 2 diabetes mellitus without complications: Secondary | ICD-10-CM | POA: Diagnosis not present

## 2018-04-04 MED ORDER — HYDROCODONE-ACETAMINOPHEN 5-325 MG PO TABS
1.0000 | ORAL_TABLET | Freq: Once | ORAL | Status: AC
  Administered 2018-04-04: 1 via ORAL
  Filled 2018-04-04: qty 1

## 2018-04-04 MED ORDER — HYDROCODONE-ACETAMINOPHEN 5-325 MG PO TABS: 1.0000 | ORAL_TABLET | ORAL | 0 refills | Status: DC | PRN

## 2018-04-04 MED ORDER — DICLOFENAC SODIUM 75 MG PO TBEC: 75.0000 mg | DELAYED_RELEASE_TABLET | Freq: Two times a day (BID) | ORAL | 0 refills | Status: DC

## 2018-04-04 NOTE — Telephone Encounter (Signed)
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 Please call to advise

## 2018-04-04 NOTE — Discharge Instructions (Addendum)
Avoid activities that worsens your knee pain.  Use the medicines prescribed. You may take the hydrocodone prescribed for pain relief.  This will make you drowsy - do not drive within 4 hours of taking this medication.  You may get relief from applying a heating pad to your knee for 20 minutes several times daily.  Your xray suggests possible early arthritis in your knee which can explain your symptoms, but the locking symptom suggests a possible torn meniscus or floating piece of cartilage. You need an orthopedic doctor to help you with this problem.

## 2018-04-04 NOTE — Telephone Encounter (Signed)
Called pt. She would like letter mailed. I placed in mail for her.

## 2018-04-04 NOTE — Telephone Encounter (Signed)
Letter printed, waiting on MD signature °

## 2018-04-04 NOTE — ED Triage Notes (Signed)
Pt C/O left knee pain after falling. Pt states that her knee "keeps buckling."

## 2018-04-05 NOTE — ED Provider Notes (Signed)
[facility] EMERGENCY DEPARTMENT Provider Note   CSN: [id number] Arrival date & time: 04/04/18  2055     History   Chief Complaint Chief Complaint  Patient presents with  . Leg Pain    HPI Brooke [name] is a 33 y.o. female.  The history is provided by the patient.  Leg Pain  Location:  Knee Time since incident:  3 weeks Knee location:  L knee Pain details:    Quality:  Sharp   Radiates to:  Does not radiate   Severity:  Severe   Duration:  3 weeks   Timing:  Intermittent   Progression:  Unchanged Chronicity:  New Dislocation: no (she describes an occasional locking sensation in the knee when in full extension which has caused several falls,)   Relieved by: was prescribed naproxen at last visit for this without relief. Worsened by:  Bearing weight and extension Associated symptoms: no fever, no numbness and no stiffness   Risk factors: obesity     Past Medical History:  Diagnosis Date  . Anxiety   . Chronic back pain   . Depression   . Diabetes mellitus without complication (HCC)   . Edema   . MS (multiple sclerosis) (HCC) 09/29/2014    Patient Active Problem List   Diagnosis Date Noted  . Gait disturbance 02/07/2018  . Left leg weakness 11/07/2017  . Leg numbness 11/07/2017  . Other fatigue 11/07/2017  . Acute appendicitis 05/12/2015  . MS (multiple sclerosis) (HCC) 09/29/2014  . Positive ANA (antinuclear antibody) 07/02/2014  . Abnormal MRI of head 04/22/2014  . Transient diplopia 04/22/2014  . 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 Spell of memory loss 04/22/2014  . Recurrent falls while walking 04/22/2014    Past Surgical History:  Procedure Laterality Date  . APPENDECTOMY    . CHOLECYSTECTOMY N/A 10/07/2013   Procedure: LAPAROSCOPIC CHOLECYSTECTOMY;  Surgeon: Dalia Heading, MD;  Location: [facility];  Service: General;  Laterality: N/A;  . LAPAROSCOPIC APPENDECTOMY N/A 05/12/2015   Procedure: APPENDECTOMY LAPAROSCOPIC;  Surgeon: Franky Macho, MD;  Location: [facility];  Service: General;  Laterality: N/A;  . WISDOM TOOTH EXTRACTION       OB History    Gravida  2   Para      Term      Preterm      AB  2   Living  0     SAB  1   TAB      Ectopic  1   Multiple      Live Births               Home Medications    Prior to Admission medications   Medication Sig Start Date End Date Taking? Authorizing Provider  ALPRAZolam Prudy Feeler) 0.5 MG tablet Take 0.5 mg by mouth daily as needed. 06/27/17   [provider]  amitriptyline (ELAVIL) 25 MG tablet Take 25 mg by mouth at bedtime as needed. for sleep 05/19/17   [provider]  amphetamine-dextroamphetamine (ADDERALL XR) 30 MG 24 hr capsule Take 1 capsule (30 mg total) by mouth daily. 02/07/18   Sater, Pearletha Furl, MD  calcium-vitamin D 250-100 MG-UNIT tablet Take 1 tablet by mouth 2 (two) times daily. 04/24/17   Dohmeier, Porfirio Mylar, MD  cetirizine (ZYRTEC) 10 MG tablet Take 10 mg by mouth daily as needed. 03/10/17   [provider]  dalfampridine 10 MG TB12 One po bid 02/07/18   Sater, Pearletha Furl, MD  diclofenac (VOLTAREN) 75 MG  EC tablet Take 1 tablet (75 mg total) by mouth 2 (two) times daily. 04/04/18   IdolRaynelle Fanning, Finnbar Cedillos, PA-C  Dimethyl Fumarate (TECFIDERA) 240 MG CPDR Take 240 mg by mouth 2 (two) times daily.    [provider]  ferrous sulfate 325 (65 FE) MG tablet Take 1 tablet (325 mg total) by mouth every morning. 04/24/17   Dohmeier, Porfirio Mylararmen, MD  fluticasone (FLONASE) 50 MCG/ACT nasal spray Place 1 spray into both nostrils daily as needed for allergies or rhinitis.    [provider]  gabapentin (NEURONTIN) 300 MG capsule Take 2 capsules (600 mg total) by mouth 3 (three) times daily. 11/07/17   Sater, Pearletha Furlichard A, MD  HYDROcodone-acetaminophen (NORCO/VICODIN) 5-325 MG tablet Take 1 tablet by mouth every 4 (four) hours as needed. 04/04/18   Burgess AmorIdol, Luz Mares, PA-C  methocarbamol (ROBAXIN-750) 750 MG tablet Take 1 tablet (750 mg total) by mouth 4 (four) times daily.  01/17/18   Burgess AmorIdol, Cornie Herrington, PA-C  Multiple Vitamin (MULTIVITAMIN) tablet Take 1 tablet by mouth daily.    [provider]  tiZANidine (ZANAFLEX) 4 MG tablet Take 4 mg by mouth 2 (two) times daily as needed. 03/06/17   [provider]    Family History Family History  Problem Relation Age of Onset  . Diabetes Mother   . Colon polyps Mother        hx of cancer  . Other Mother        DDD Lumber, cervical  . Sleep apnea Father     Social History Social History   Tobacco Use  . Smoking status: Former Smoker    Last attempt to quit: 09/16/2007    Years since quitting: 10.5  . Smokeless tobacco: Never Used  Substance Use Topics  . Alcohol use: No    Alcohol/week: 0.0 standard drinks  . Drug use: No    Types: Marijuana    Comment: no drug use since 2010     Allergies   Lexapro [escitalopram oxalate]; Tysabri [natalizumab]; and Latex   Review of Systems Review of Systems  Constitutional: Negative for fever.  Musculoskeletal: Positive for arthralgias. Negative for joint swelling, myalgias and stiffness.  Neurological: Negative for weakness and numbness.     Physical Exam Updated Vital Signs BP 131/71 (BP Location: Right Arm)   Pulse 81   Temp 98.5 F (36.9 C) (Oral)   Resp 18   Ht 5\' 4"  (1.626 m)   Wt (!) 154.2 kg   LMP 03/23/2018   SpO2 98%   BMI 58.36 kg/m   Physical Exam Vitals signs and nursing note reviewed.  Constitutional:      Appearance: She is well-developed.  HENT:     Head: Atraumatic.  Neck:     Musculoskeletal: Normal range of motion.  Cardiovascular:     Comments: Pulses equal bilaterally Musculoskeletal:        General: Tenderness present. No swelling or deformity.     Left knee: She exhibits no effusion, no deformity, no LCL laxity and no MCL laxity. Tenderness found. Medial joint line tenderness noted.     Left lower leg: No edema.     Comments: No obvious laxity on inversion/eversion or attempted drawer test, difficult  secondary to body habitus. No crepitus with ROM.  Skin:    General: Skin is warm and dry.  Neurological:     Mental Status: She is alert.     Sensory: No sensory deficit.     Deep Tendon Reflexes: Reflexes normal.  ED Treatments / Results  Labs (all labs ordered are listed, but only abnormal results are displayed) Labs Reviewed - No data to display  EKG None  Radiology Dg Knee Complete 4 Views Left  Result Date: 04/04/2018 CLINICAL DATA:  Medial left knee pain after fall 2-3 weeks ago. EXAM: LEFT KNEE - COMPLETE 4+ VIEW COMPARISON:  02/25/2018 FINDINGS: Slight medial femorotibial joint space narrowing without acute fracture joint dislocation. No joint effusion. No suspicious osseous lesions. Soft tissues are unremarkable. IMPRESSION: No acute osseous abnormality of the right knee. Electronically Signed   By: Tollie Eth M.D.   On: 04/04/2018 22:26    Procedures Procedures (including critical care time)  Medications Ordered in ED Medications  HYDROcodone-acetaminophen (NORCO/VICODIN) 5-325 MG per tablet 1 tablet (1 tablet Oral Given 04/04/18 2336)     Initial Impression / Assessment and Plan / ED Course  I have reviewed the triage vital signs and the nursing notes.  Pertinent labs & imaging results that were available during my care of the patient were reviewed by me and considered in my medical decision making (see chart for details).     Given locking sensation concern for meniscal injury. Discussed need for ortho f/u. She was referred to Dr. Romeo Apple at last visit, pt unaware of this plan. Reiterated to call for appt. Discussed home tx, trial of diclofenac, hydrocodone prn, heat tx.  Prn f/u anticipated.  No LLE edema, no effusion or warmth or erythema at left knee.  Final Clinical Impressions(s) / ED Diagnoses   Final diagnoses:  Acute pain of left knee    ED Discharge Orders         Ordered    HYDROcodone-acetaminophen (NORCO/VICODIN) 5-325 MG tablet  Every 4  hours PRN     04/04/18 2327    diclofenac (VOLTAREN) 75 MG EC tablet  2 times daily     04/04/18 2327           Burgess Amor, Cordelia Poche 04/05/18 1344    Bethann Berkshire, MD 04/05/18 [id number]

## 2018-04-10 ENCOUNTER — Telehealth: Payer: Self-pay | Admitting: Orthopedic Surgery

## 2018-04-10 NOTE — Telephone Encounter (Signed)
Patient called following Brooke [name] Emergency room visit for acute knee pain; offered appointment; discussed that per her insurance requirement, referral from primary care provider is needed. States has been needing to schedule with her doctor at [facility] as well, so she will request a referral.  Appointment pending; patient aware.

## 2018-04-12 ENCOUNTER — Encounter: Payer: Self-pay | Admitting: Women's Health

## 2018-04-12 ENCOUNTER — Ambulatory Visit: Payer: Medicaid Other | Admitting: Women's Health

## 2018-04-12 VITALS — BP 134/80 | HR 82 | Ht 64.0 in | Wt 354.8 lb

## 2018-04-12 DIAGNOSIS — N898 Other specified noninflammatory disorders of vagina: Secondary | ICD-10-CM

## 2018-04-12 DIAGNOSIS — Z113 Encounter for screening for infections with a predominantly sexual mode of transmission: Secondary | ICD-10-CM | POA: Diagnosis not present

## 2018-04-12 NOTE — Progress Notes (Signed)
   GYN VISIT Patient name: Brooke [name] MRN [id number]  Date of birth: [birth year] Chief Complaint:   vaginal discharge with odor  History of Present Illness:   Brooke [name] is a 33 y.o. [id number] African American female being seen today for report of vaginal discharge and odor x 1-2wks. No itching/irritation. Recent change in sex partners about 4 months ago.     Patient's last menstrual period was 03/23/2018. The current method of family planning is none.  Not trying to conceive/but not preventing it, was told she couldn't get pregnant d/t MS. Has had 2 pregnancies in past w/ husband who is now deceased- 1 ectopic, 1 SAB Last pap 2015. Results were:  normal Review of Systems:   Pertinent items are noted in HPI Denies fever/chills, dizziness, headaches, visual disturbances, fatigue, shortness of breath, chest pain, abdominal pain, vomiting, abnormal vaginal discharge/itching/odor/irritation, problems with periods, bowel movements, urination, or intercourse unless otherwise stated above.  Pertinent History Reviewed:  Reviewed past medical,surgical, social, obstetrical and family history.  Reviewed problem list, medications and allergies. Physical Assessment:   Vitals:   04/12/18 0929  BP: 134/80  Pulse: 82  Weight: (!) 354 lb 12.8 oz (160.9 kg)  Height: 5\' 4"  (1.626 m)  Body mass index is 60.9 kg/m.       Physical Examination:   General appearance: alert, well appearing, and in no distress  Mental status: alert, oriented to person, place, and time  Skin: warm & dry   Cardiovascular: normal heart rate noted  Respiratory: normal respiratory effort, no distress  Abdomen: soft, non-tender   Pelvic: VULVA: normal appearing vulva with no masses, tenderness or lesions, VAGINA: normal appearing vagina with normal color and malodorous discharge, no lesions, CERVIX: normal appearing cervix without discharge or lesions  Extremities: no edema   No results found for this or any  previous visit (from the past 24 hour(s)).  Assessment & Plan:  1) Vaginal d/c w/ odor> nuswab sent  2) STD screen> as below  3) No contraception> start pnv daily  4) Past-due for pap> schedule today  Meds: No orders of the defined types were placed in this encounter.   Orders Placed This Encounter  Procedures  . HIV Antibody (routine testing w rflx)  . RPR  . Hepatitis B surface antigen  . NuSwab Vaginitis Plus (VG+)    Return in about 4 weeks (around 05/10/2018) for Pap & physical.  Cheral Marker CNM, WHNP-BC 04/12/2018 10:01 AM

## 2018-04-12 NOTE — Patient Instructions (Signed)
Prenatal vitamin daily. 

## 2018-04-13 LAB — RPR: RPR Ser Ql: NONREACTIVE

## 2018-04-13 LAB — HIV ANTIBODY (ROUTINE TESTING W REFLEX): HIV Screen 4th Generation wRfx: NONREACTIVE

## 2018-04-13 LAB — HEPATITIS B SURFACE ANTIGEN: Hepatitis B Surface Ag: NEGATIVE

## 2018-04-15 LAB — NUSWAB VAGINITIS PLUS (VG+)
Atopobium vaginae: HIGH Score — AB
BVAB 2: HIGH Score — AB
Candida albicans, NAA: NEGATIVE
Candida glabrata, NAA: NEGATIVE
Chlamydia trachomatis, NAA: NEGATIVE
Neisseria gonorrhoeae, NAA: NEGATIVE
Trich vag by NAA: POSITIVE — AB

## 2018-04-24 ENCOUNTER — Telehealth: Payer: Self-pay | Admitting: *Deleted

## 2018-04-24 ENCOUNTER — Other Ambulatory Visit: Payer: Self-pay | Admitting: Women's Health

## 2018-04-24 DIAGNOSIS — N760 Acute vaginitis: Secondary | ICD-10-CM

## 2018-04-24 DIAGNOSIS — B9689 Other specified bacterial agents as the cause of diseases classified elsewhere: Secondary | ICD-10-CM | POA: Insufficient documentation

## 2018-04-24 DIAGNOSIS — A599 Trichomoniasis, unspecified: Secondary | ICD-10-CM | POA: Insufficient documentation

## 2018-04-24 MED ORDER — METRONIDAZOLE 500 MG PO TABS: 500.0000 mg | ORAL_TABLET | Freq: Two times a day (BID) | ORAL | 0 refills | Status: DC

## 2018-04-24 NOTE — Telephone Encounter (Signed)
LMOVM for patient to return my call regarding lab results.

## 2018-04-25 ENCOUNTER — Telehealth: Payer: Self-pay | Admitting: *Deleted

## 2018-04-25 NOTE — Telephone Encounter (Signed)
Metronidazole 2gm po x 1 called in for Brooke [name], DOB: [birth year], NKDA, [employer]. Cheral Marker, CNM, [facility] 04/25/2018 4:45 PM

## 2018-04-25 NOTE — Telephone Encounter (Signed)
Patient called back. Wants boyfriend treated.    Brooke [name], DOB: [birth year], NKDA, [employer].

## 2018-04-25 NOTE — Telephone Encounter (Signed)
Patient informed she tested positive for BV and trich. Prescription was sent to pharmacy but partner needs treatment. No sex for 7 days after both are treated.  Patient asked if she had to tell him and I informed her if she did not tell him and continued to have intercourse with him, she would get it back. But more importantly he needed to know to be treated to avoid transmitting to others.  Pt stated she would call back if he was to have [place] treat him.

## 2018-05-01 ENCOUNTER — Ambulatory Visit: Payer: Medicaid Other | Admitting: Orthopaedic Surgery

## 2018-05-01 ENCOUNTER — Encounter: Payer: Self-pay | Admitting: Orthopaedic Surgery

## 2018-05-01 VITALS — BP 117/67 | HR 65 | Temp 98.1°F | Ht 64.0 in | Wt 350.0 lb

## 2018-05-01 DIAGNOSIS — Z6841 Body Mass Index (BMI) 40.0 and over, adult: Secondary | ICD-10-CM

## 2018-05-01 DIAGNOSIS — M25562 Pain in left knee: Secondary | ICD-10-CM | POA: Diagnosis not present

## 2018-05-01 NOTE — Progress Notes (Signed)
Patient Brooke [name], female DOB:September 16, 1985, 33 y.o. RAX:[id number]  Chief Complaint  Patient presents with  . Knee Pain    Left knee pain.    HPI  Brooke [name] is a 33 y.o. female who fell and hurt her left knee on 04-04-2018.  She was seen in the ER and x-rays were done.  They were negative.  She has not gotten better.  She has giving way of the knee, swelling and popping.  She has no redness.  She has no other injury.  Nothing helps it.   Body mass index is 60.08 kg/m.  The patient meets the AMA guidelines for Morbid (severe) obesity with a BMI > 40.0 and I have recommended weight loss.   ROS  Review of Systems  Constitutional: Positive for activity change.  HENT: Negative for congestion.   Respiratory: Negative for cough and shortness of breath.   Cardiovascular: Negative for chest pain and leg swelling.  Endocrine: Positive for cold intolerance.  Musculoskeletal: Positive for arthralgias, back pain, gait problem and joint swelling.  Allergic/Immunologic: Positive for environmental allergies.  Psychiatric/Behavioral: The patient is nervous/anxious.   All other systems reviewed and are negative.   All other systems reviewed and are negative.  The following is a summary of the past history medically, past history surgically, known current medicines, social history and family history.  This information is gathered electronically by the computer from prior information and documentation.  I review this each visit and have found including this information at this point in the chart is beneficial and informative.    Past Medical History:  Diagnosis Date  . Anxiety   . Chronic back pain   . Depression   . Diabetes mellitus without complication (HCC)   . Edema   . MS (multiple sclerosis) (HCC) 09/29/2014    Past Surgical History:  Procedure Laterality Date  . APPENDECTOMY    . CHOLECYSTECTOMY N/A 10/07/2013   Procedure: LAPAROSCOPIC CHOLECYSTECTOMY;  Surgeon:  Dalia Heading, MD;  Location: [facility];  Service: General;  Laterality: N/A;  . LAPAROSCOPIC APPENDECTOMY N/A 05/12/2015   Procedure: APPENDECTOMY LAPAROSCOPIC;  Surgeon: Franky Macho, MD;  Location: [facility];  Service: General;  Laterality: N/A;  . WISDOM TOOTH EXTRACTION      Family History  Problem Relation Age of Onset  . Diabetes Mother   . Colon polyps Mother        hx of cancer  . Other Mother        DDD Lumber, cervical  . Sleep apnea Father     Social History Social History   Tobacco Use  . Smoking status: Former Smoker    Last attempt to quit: 09/16/2007    Years since quitting: 10.6  . Smokeless tobacco: Never Used  Substance Use Topics  . Alcohol use: No    Alcohol/week: 0.0 standard drinks  . Drug use: No    Types: Marijuana    Comment: no drug use since 2010    Allergies  Allergen Reactions  . Lexapro [Escitalopram Oxalate] Other (See Comments)    Suicidal thoughts, lowered libido  . Tysabri [Natalizumab] Hives    Hives, itching, elevated HR and BP.  [name] Latex Hives    Current Outpatient Medications  Medication Sig Dispense Refill  . ALPRAZolam (XANAX) 0.5 MG tablet Take 0.5 mg by mouth daily as needed.  2  . amitriptyline (ELAVIL) 25 MG tablet Take 25 mg by mouth at bedtime as needed. for sleep  2  .  amphetamine-dextroamphetamine (ADDERALL XR) 30 MG 24 hr capsule Take 1 capsule (30 mg total) by mouth daily. 30 capsule 0  . calcium-vitamin D 250-100 MG-UNIT tablet Take 1 tablet by mouth 2 (two) times daily. 60 tablet 5  . cetirizine (ZYRTEC) 10 MG tablet Take 10 mg by mouth daily as needed.  5  . dalfampridine 10 MG TB12 One po bid 60 tablet 11  . diclofenac (VOLTAREN) 75 MG EC tablet Take 1 tablet (75 mg total) by mouth 2 (two) times daily. 20 tablet 0  . Dimethyl Fumarate (TECFIDERA) 240 MG CPDR Take 240 mg by mouth 2 (two) times daily.    . ferrous sulfate 325 (65 FE) MG tablet Take 1 tablet (325 mg total) by mouth every morning. 90 tablet 3  .  fluticasone (FLONASE) 50 MCG/ACT nasal spray Place 1 spray into both nostrils daily as needed for allergies or rhinitis.    [name] gabapentin (NEURONTIN) 300 MG capsule Take 2 capsules (600 mg total) by mouth 3 (three) times daily. 180 capsule 5  . HYDROcodone-acetaminophen (NORCO/VICODIN) 5-325 MG tablet Take 1 tablet by mouth every 4 (four) hours as needed. 15 tablet 0  . metroNIDAZOLE (FLAGYL) 500 MG tablet Take 1 tablet (500 mg total) by mouth 2 (two) times daily. 14 tablet 0  . Multiple Vitamin (MULTIVITAMIN) tablet Take 1 tablet by mouth daily.    [name] tiZANidine (ZANAFLEX) 4 MG tablet Take 4 mg by mouth 2 (two) times daily as needed.  2   No current facility-administered medications for this visit.      Physical Exam  Blood pressure 117/67, pulse 65, temperature 98.1 F (36.7 C), height 5\' 4"  (1.626 m), weight (!) 350 lb (158.8 kg).  Constitutional: overall normal hygiene, normal nutrition, well developed, normal grooming, normal body habitus. Assistive device:none  Musculoskeletal: gait and station Limp left, muscle tone and strength are normal, no tremors or atrophy is present.  .  Neurological: coordination overall normal.  Deep tendon reflex/nerve stretch intact.  Sensation normal.  Cranial nerves II-XII intact.   Skin:   Normal overall no scars, lesions, ulcers or rashes. No psoriasis.  Psychiatric: Alert and oriented x 3.  Recent memory intact, remote memory unclear.  Normal mood and affect. Well groomed.  Good eye contact.  Cardiovascular: overall no swelling, no varicosities, no edema bilaterally, normal temperatures of the legs and arms, no clubbing, cyanosis and good capillary refill.  Lymphatic: palpation is normal.  Left knee with medial pain, crepitus, effusion, ROM 0 to 105, limp left, pain medial joint line, positive medial McMurray.  All other systems reviewed and are negative   The patient has been educated about the nature of the problem(s) and counseled on  treatment options.  The patient appeared to understand what I have discussed and is in agreement with it.  Encounter Diagnosis  Name Primary?  . Acute pain of left knee Yes   PROCEDURE NOTE:  The patient requests injections of the left knee , verbal consent was obtained.  The left knee was prepped appropriately after time out was performed.   Sterile technique was observed and injection of 1 cc of Depo-Medrol 40 mg with several cc's of plain xylocaine. Anesthesia was provided by ethyl chloride and a 20-gauge needle was used to inject the knee area. The injection was tolerated well.  A band aid dressing was applied.  The patient was advised to apply ice later today and tomorrow to the injection sight as needed.   PLAN Call if any  problems.  Precautions discussed.  Continue current medications.   Return to clinic 2 weeks   I am concerned about medial meniscus tear.  Consider MRI if not improved.  Electronically Signed Darreld Mclean, MD 3/10/20203:20 PM

## 2018-05-10 ENCOUNTER — Ambulatory Visit (INDEPENDENT_AMBULATORY_CARE_PROVIDER_SITE_OTHER): Payer: Medicaid Other | Admitting: Women's Health

## 2018-05-10 ENCOUNTER — Other Ambulatory Visit: Payer: Self-pay

## 2018-05-10 ENCOUNTER — Other Ambulatory Visit (HOSPITAL_COMMUNITY)
Admission: RE | Admit: 2018-05-10 | Discharge: 2018-05-10 | Disposition: A | Discharge status: Home / Self Care | Payer: Medicaid Other | Source: Ambulatory Visit | Attending: Obstetrics & Gynecology | Admitting: Obstetrics & Gynecology

## 2018-05-10 ENCOUNTER — Encounter: Payer: Self-pay | Admitting: Women's Health

## 2018-05-10 VITALS — BP 128/62 | HR 75 | Ht 64.0 in | Wt 345.0 lb

## 2018-05-10 DIAGNOSIS — Z01419 Encounter for gynecological examination (general) (routine) without abnormal findings: Secondary | ICD-10-CM | POA: Insufficient documentation

## 2018-05-10 DIAGNOSIS — A599 Trichomoniasis, unspecified: Secondary | ICD-10-CM

## 2018-05-10 DIAGNOSIS — Z0000 Encounter for general adult medical examination without abnormal findings: Secondary | ICD-10-CM | POA: Diagnosis not present

## 2018-05-10 LAB — POCT WET PREP (WET MOUNT)
Clue Cells Wet Prep Whiff POC: POSITIVE
Trichomonas Wet Prep HPF POC: ABSENT

## 2018-05-10 NOTE — Addendum Note (Signed)
Addended by: Federico Flake A on: 05/10/2018 11:01 AM   Modules accepted: Orders

## 2018-05-10 NOTE — Progress Notes (Signed)
WELL-WOMAN EXAMINATION Patient name: Brooke [name] MRN [id number]  Date of birth: December 18, 1985 Chief Complaint:   Gynecologic Exam (pap/physcial)  History of Present Illness:   Brooke [name] is a 33 y.o. G51P0020 African American female being seen today for a routine well-woman exam. +trichomonas 2/20, treated 3/4. Neg HIV, RPR, Hep B then. Denies abnormal discharge, itching/odor/irritation.   Current complaints: none  PCP: Dr. Concepcion Elk      does not desire labs Patient's last menstrual period was 04/20/2018. The current method of family planning is none. Not trying to conceive, but not preventing, was told she couldn't get pregnant d/t MS. Has had 2 pregnancies in past w/ husband who is now deceased. W/ current partner x 81yrs, has never used protection.  Last pap 08/16/13. Results were: normal Last mammogram: years ago, was done b/c her mom had bowel cancer?. Results were: normal. Family h/o breast cancer: No Last colonoscopy: never. Results were: n/a. Family h/o colorectal cancer: No Review of Systems:   Pertinent items are noted in HPI Denies any headaches, blurred vision, fatigue, shortness of breath, chest pain, abdominal pain, abnormal vaginal discharge/itching/odor/irritation, problems with periods, bowel movements, urination, or intercourse unless otherwise stated above. Pertinent History Reviewed:  Reviewed past medical,surgical, social and family history.  Reviewed problem list, medications and allergies. Physical Assessment:   Vitals:   05/10/18 0955  BP: 128/62  Pulse: 75  Weight: (!) 345 lb (156.5 kg)  Height: 5\' 4"  (1.626 m)  Body mass index is 59.22 kg/m.        Physical Examination:   General appearance - well appearing, and in no distress  Mental status - alert, oriented to person, place, and time  Psych:  She has a normal mood and affect  Skin - warm and dry, normal color, no suspicious lesions noted  Chest - effort normal, all lung fields clear to  auscultation bilaterally  Heart - normal rate and regular rhythm  Neck:  midline trachea, no thyromegaly or nodules  Breasts - breasts appear normal, no suspicious masses, no skin or nipple changes or  axillary nodes  Abdomen - soft, nontender, nondistended, no masses or organomegaly  Pelvic - VULVA: normal appearing vulva with no masses, tenderness or lesions  VAGINA: normal appearing vagina with normal color and discharge, no lesions  CERVIX: normal appearing cervix without discharge or lesions, no CMT  Thin prep pap is done w/ HR HPV cotesting  UTERUS: uterus is felt to be normal size, shape, consistency and nontender   ADNEXA: No adnexal masses or tenderness noted.  Extremities:  No swelling or varicosities noted  Results for orders placed or performed in visit on 05/10/18 (from the past 24 hour(s))  POCT Wet Prep [facility])   Collection Time: 05/10/18 10:48 AM  Result Value Ref Range   Source Wet Prep POC vaginal    WBC, Wet Prep HPF POC few    Bacteria Wet Prep HPF POC Few Few   BACTERIA WET PREP MORPHOLOGY POC     Clue Cells Wet Prep HPF POC Few (A) None   Clue Cells Wet Prep Whiff POC Positive Whiff    Yeast Wet Prep HPF POC None None   KOH Wet Prep POC     Trichomonas Wet Prep HPF POC Absent Absent    Assessment & Plan:  1) Well-Woman Exam  2) Recent trichomonas infection>POC neg, few clues on wet prep, asymptomatic, won't treat  Labs/procedures today: pap, wet prep  Mammogram @31yo  or sooner if  problems Colonoscopy @45 -50yo or sooner if problems  Orders Placed This Encounter  Procedures  . POCT Wet Prep [facility])    Follow-up: Return in about 1 year (around 05/10/2019) for Physical.  Cheral Marker CNM, [facility] 05/10/2018 10:49 AM

## 2018-05-12 LAB — CYTOLOGY - PAP
Chlamydia: NEGATIVE
Diagnosis: NEGATIVE
HPV: NOT DETECTED
Neisseria Gonorrhea: NEGATIVE

## 2018-05-15 ENCOUNTER — Ambulatory Visit: Payer: Medicaid Other | Admitting: Orthopaedic Surgery

## 2018-05-15 ENCOUNTER — Encounter: Payer: Self-pay | Admitting: Orthopaedic Surgery

## 2018-05-21 ENCOUNTER — Telehealth: Payer: Self-pay | Admitting: Orthopaedic Surgery

## 2018-05-21 NOTE — Telephone Encounter (Signed)
Patient called to reschedule her missed appointment on 3/24. You see her for L Knee Pain. States it   is still hurting and buckling. Do you want her to come in for a office visit or do you want to give her a   virtual office call?  Please advise.

## 2018-05-21 NOTE — Telephone Encounter (Signed)
Virtual call.  MRI cannot be done.  Surgery cannot be done.

## 2018-05-22 ENCOUNTER — Telehealth: Payer: Self-pay | Admitting: Orthopaedic Surgery

## 2018-05-22 NOTE — Telephone Encounter (Signed)
When could you do a virtual visit on this patient?

## 2018-05-23 ENCOUNTER — Telehealth: Payer: Self-pay | Admitting: *Deleted

## 2018-05-23 ENCOUNTER — Encounter: Payer: Self-pay | Admitting: *Deleted

## 2018-05-23 NOTE — Telephone Encounter (Signed)
I called pt. She will sign up for mychart so I can release letter there. She will call back if she has any issues signing up

## 2018-05-23 NOTE — Telephone Encounter (Signed)
Completed/signed [place] [facility]/human [facility] form given back to medical records to process for pt.

## 2018-05-23 NOTE — Telephone Encounter (Signed)
I checked and pt was able to sign up for mychart. I forwarded letter via Earleen Reaper.

## 2018-05-23 NOTE — Telephone Encounter (Signed)
Pt still has not rec'd this letter. Please call to advise

## 2018-05-24 ENCOUNTER — Encounter: Payer: Self-pay | Admitting: Orthopaedic Surgery

## 2018-05-24 ENCOUNTER — Ambulatory Visit (INDEPENDENT_AMBULATORY_CARE_PROVIDER_SITE_OTHER): Payer: Medicaid Other | Admitting: Orthopaedic Surgery

## 2018-05-24 ENCOUNTER — Other Ambulatory Visit: Payer: Self-pay

## 2018-05-24 DIAGNOSIS — M25562 Pain in left knee: Secondary | ICD-10-CM

## 2018-05-24 DIAGNOSIS — Z6841 Body Mass Index (BMI) 40.0 and over, adult: Secondary | ICD-10-CM

## 2018-05-24 NOTE — Progress Notes (Signed)
Virtual Visit via Telephone Note  I connected with Brooke [name] on 05/24/18 at  9:20 AM EDT by telephone and verified that I am speaking with the correct person using two identifiers.   I discussed the limitations, risks, security and privacy concerns of performing an evaluation and management service by telephone and the availability of in person appointments. I also discussed with the patient that there may be a patient responsible charge related to this service. The patient expressed understanding and agreed to proceed.   History of Present Illness: She has left knee pain with swelling and giving way.  She has no redness.  She has no new trauma. The injection only helped a little. She is taking Naprosyn.   Observations/Objective: She continues to have giving way and swelling of the knee on the left.  She has no new trauma.  Assessment and Plan: Encounter Diagnoses  Name Primary?  . Acute pain of left knee Yes  . Body mass index 60.0-69.9, adult (HCC)   . Morbid obesity (HCC)    I am concerned she has a torn meniscus.  Follow Up Instructions: To have telephone visit in one month.  Hopefully MRI is back up and running. It is closed now because of COVID-19 problem.  Elective surgery is closed also.   I discussed the assessment and treatment plan with the patient. The patient was provided an opportunity to ask questions and all were answered. The patient agreed with the plan and demonstrated an understanding of the instructions.   The patient was advised to call back or seek an in-person evaluation if the symptoms worsen or if the condition fails to improve as anticipated.  I provided 8 minutes of non-face-to-face time during this encounter.   Darreld Mclean, MD

## 2018-06-04 ENCOUNTER — Telehealth: Payer: Self-pay | Admitting: *Deleted

## 2018-06-04 NOTE — Telephone Encounter (Signed)
Faxed completed/signed Tysabri d/c questionnaire back to touch prescribing program at [phone]. Received fax confirmation.

## 2018-06-21 ENCOUNTER — Ambulatory Visit: Payer: Medicaid Other | Admitting: Orthopaedic Surgery

## 2018-06-21 ENCOUNTER — Encounter: Payer: Self-pay | Admitting: Orthopaedic Surgery

## 2018-06-21 ENCOUNTER — Other Ambulatory Visit: Payer: Self-pay

## 2018-06-21 VITALS — BP 127/78 | HR 78 | Ht 64.0 in | Wt 354.0 lb

## 2018-06-21 DIAGNOSIS — G8929 Other chronic pain: Secondary | ICD-10-CM | POA: Diagnosis not present

## 2018-06-21 DIAGNOSIS — Z6841 Body Mass Index (BMI) 40.0 and over, adult: Secondary | ICD-10-CM

## 2018-06-21 DIAGNOSIS — M25562 Pain in left knee: Secondary | ICD-10-CM

## 2018-06-21 DIAGNOSIS — M25511 Pain in right shoulder: Secondary | ICD-10-CM

## 2018-06-21 MED ORDER — NAPROXEN 500 MG PO TABS: 500.0000 mg | ORAL_TABLET | Freq: Two times a day (BID) | ORAL | 5 refills | Status: DC

## 2018-06-21 NOTE — Addendum Note (Signed)
Addended by: Baird Kay on: 06/21/2018 10:40 AM   Modules accepted: Orders

## 2018-06-21 NOTE — Progress Notes (Signed)
PROCEDURE NOTE:  The patient requests injections of the left knee , verbal consent was obtained.  The left knee was prepped appropriately after time out was performed.   Sterile technique was observed and injection of 1 cc of Depo-Medrol 40 mg with several cc's of plain xylocaine. Anesthesia was provided by ethyl chloride and a 20-gauge needle was used to inject the knee area. The injection was tolerated well.  A band aid dressing was applied.  The patient was advised to apply ice later today and tomorrow to the injection sight as needed.  PROCEDURE NOTE:  The patient request injection, verbal consent was obtained.  The right shoulder was prepped appropriately after time out was performed.   Sterile technique was observed and injection of 1 cc of Depo-Medrol 40 mg with several cc's of plain xylocaine. Anesthesia was provided by ethyl chloride and a 20-gauge needle was used to inject the shoulder area. A posterior approach was used.  The injection was tolerated well.  A band aid dressing was applied.  The patient was advised to apply ice later today and tomorrow to the injection sight as needed.  I called in Naprosyn for her.  Return in one month.  She needs MRI of the left knee.  It is giving way.  Electronically Signed Darreld Mclean, MD 4/30/202010:26 AM

## 2018-06-28 DIAGNOSIS — Z0271 Encounter for disability determination: Secondary | ICD-10-CM

## 2018-07-19 ENCOUNTER — Ambulatory Visit: Payer: Medicaid Other | Admitting: Orthopaedic Surgery

## 2018-07-27 ENCOUNTER — Other Ambulatory Visit: Payer: Self-pay | Admitting: Neurology

## 2018-07-27 DIAGNOSIS — R296 Repeated falls: Secondary | ICD-10-CM

## 2018-07-27 DIAGNOSIS — G35 Multiple sclerosis: Secondary | ICD-10-CM

## 2018-08-01 ENCOUNTER — Other Ambulatory Visit: Payer: Medicaid Other

## 2018-08-07 ENCOUNTER — Encounter: Payer: Self-pay | Admitting: Orthopaedic Surgery

## 2018-08-07 ENCOUNTER — Ambulatory Visit: Payer: Medicaid Other | Admitting: Orthopaedic Surgery

## 2018-08-07 ENCOUNTER — Other Ambulatory Visit: Payer: Self-pay

## 2018-08-07 VITALS — Temp 96.8°F | Ht 64.0 in | Wt 354.0 lb

## 2018-08-07 DIAGNOSIS — G8929 Other chronic pain: Secondary | ICD-10-CM

## 2018-08-07 DIAGNOSIS — M25562 Pain in left knee: Secondary | ICD-10-CM

## 2018-08-07 DIAGNOSIS — Z6841 Body Mass Index (BMI) 40.0 and over, adult: Secondary | ICD-10-CM

## 2018-08-07 NOTE — Progress Notes (Signed)
PROCEDURE NOTE:  The patient requests injections of the left knee , verbal consent was obtained.  The left knee was prepped appropriately after time out was performed.   Sterile technique was observed and injection of 1 cc of Depo-Medrol 40 mg with several cc's of plain xylocaine. Anesthesia was provided by ethyl chloride and a 20-gauge needle was used to inject the knee area. The injection was tolerated well.  A band aid dressing was applied.  The patient was advised to apply ice later today and tomorrow to the injection sight as needed.  She was to have MRI of the knee but she rescheduled for June 30th.  I will see her after that.  Encounter Diagnoses  Name Primary?  . Chronic pain of left knee Yes  . Body mass index 60.0-69.9, adult ([place])   . Morbid obesity [facility])    Electronically Signed Sanjuana Kava, MD 6/16/202010:00 AM

## 2018-08-08 ENCOUNTER — Telehealth: Payer: Self-pay

## 2018-08-08 NOTE — Telephone Encounter (Signed)
I reached out to the pt and discussed 08/09/18 visit. Pt and I reviewed the my chart visit and she was agreeable to this.   Pt understands that although there may be some limitations with this type of visit, we will take all precautions to reduce any security or privacy concerns.  Pt understands that this will be treated like an in office visit and we will file with pt's insurance, and there may be a patient responsible charge related to this service.  Pt's my chart link has been sent.  Pt's medications allergies and pharmacy have been updated.

## 2018-08-09 ENCOUNTER — Telehealth (INDEPENDENT_AMBULATORY_CARE_PROVIDER_SITE_OTHER): Payer: Medicaid Other | Admitting: Neurology

## 2018-08-09 ENCOUNTER — Encounter: Payer: Self-pay | Admitting: Neurology

## 2018-08-09 DIAGNOSIS — R269 Unspecified abnormalities of gait and mobility: Secondary | ICD-10-CM | POA: Diagnosis not present

## 2018-08-09 DIAGNOSIS — G35 Multiple sclerosis: Secondary | ICD-10-CM | POA: Diagnosis not present

## 2018-08-09 DIAGNOSIS — R29898 Other symptoms and signs involving the musculoskeletal system: Secondary | ICD-10-CM | POA: Diagnosis not present

## 2018-08-09 DIAGNOSIS — H532 Diplopia: Secondary | ICD-10-CM

## 2018-08-09 DIAGNOSIS — R5383 Other fatigue: Secondary | ICD-10-CM

## 2018-08-09 MED ORDER — AMPHETAMINE-DEXTROAMPHET ER 30 MG PO CP24: 30.0000 mg | ORAL_CAPSULE | Freq: Every day | ORAL | 0 refills | Status: DC

## 2018-08-09 NOTE — Progress Notes (Signed)
[facility]  PATIENT: Brooke [name] DOB: [birth year]  REFERRING DOCTOR OR PCP:  Dr. Vickey Huger; PCP is Fleet Contras SOURCE: Patient, notes from Dr. Vickey Huger, imaging and laboratory reports, MRI images on PACS.  _________________________________   HISTORICAL  CHIEF COMPLAINT:  No chief complaint on file.   HISTORY OF PRESENT ILLNESS:  Brooke [name] is a 33 year old woman with multiple sclerosis.   Despite having stable MRIs, she has noted some continued progression of her MS related symptoms.  Virtual Visit via Video Note I connected with@ on 08/09/18 at 11:30 AM EDT by a video enabled telemedicine application and verified that I am speaking with the correct person.  I discussed the limitations of evaluation and management by telemedicine and the availability of in person appointments. The patient expressed understanding and agreed to proceed.  History of Present Illness: She feels her MS is mostly stable.   However, she has had some issues with her eyes jumping around.   Of notes 2018 MRI shows a posterior left pons focus.    She is on Tecfidera now and had been in the past as well.  In the interim, she tried [place] but Ocrevus seemed to not help and Tysabri caused a reaction.   She went back to [facility] 01/2017.   Her gait is off balanced and she has some stumbles and hits the walls at times.  She notes that her left leg is weaker than the right leg and seems heavier.   Arms have good strength and coordination.    She notes fatigue but is sleeping well.    She has some anxiety and just a little depression.      Observations/Objective: She is a well-developed well-nourished woman in no acute distress.  The head is normocephalic and atraumatic.  Sclera are anicteric.  Visible skin appears normal.  The neck has a good range of motion.  Pharynx and tongue have normal appearance.  She is alert and fully oriented with fluent speech and good attention,  knowledge and memory.  Extraocular muscles are intact.  Facial strength is normal.  Palatal elevation and tongue protrusion are midline.  She appears to have normal strength in the arms.  Rapid alternating movements and finger-nose-finger are performed well.   Assessment and Plan: 1. MS (multiple sclerosis) (HCC)   2. Left leg weakness   3. Other fatigue   4. Gait disturbance   5. Transient diplopia    1.  Continue Tecfidera.  I will have her come back in 4 months and we will check a CBC with differential at that time.  We briefly discussed Mayzent and Zeposia but for now she would like to send Tecfidera. 2.   Stay active and exercise as tolerated. 3.    I will renew her Adderall as it helps her fatigue. 4.    Return in 4 months or sooner if there are new or worsening neurologic symptoms.   Follow Up Instructions: I discussed the assessment and treatment plan with the patient. The patient was provided an opportunity to ask questions and all were answered. The patient agreed with the plan and demonstrated an understanding of the instructions.    The patient was advised to call back or seek an in-person evaluation if the symptoms worsen or if the condition fails to improve as anticipated.  I provided 25 minutes of non-face-to-face time during this encounter.  _______________________________________________________  Update 02/07/2018: She had an allergic reaction to her Tysabri infusion and felt  that she progressed while on Ocrevus.  Therefore, she was to go back on Tecfidera.   She is tolerating it well and has no GI or flushing issues.   Her gait is poor due to poor balance.    She fell on Monday due to her left leg weakness and it giving out.    She has a lot of fatigue.   Phentermine did not help the energy much but she lost a couple pounds.    She sleeps well most nights.  She denies depression.  Update 11/07/2017: She tolerated her first Tysabri infusion well.  However, she had an  allergic reaction with her second Tysabri infusion.  She broke out in hives and had some shortness of breath.   She received Benadryl and had improvement.  However, she has felt much more tired.  She also feels weaker and her left leg feels more numb..      A higher dose of gabapentin 600 mg po tid is helping.    She was initially placed on Tecfidera but wanted to switch after a year and a half to [place] and stayed on that for 2-3 cycles.     She felt she progressed on Ocrevus.     Of note, she was very concerned about the length of time between Ocrevus infusions even though the mechanism of action have been explained to her.  She was more comfortable with an every month infusion prompting the switch to Tysabri.  We had a long conversation today about the various treatment options.  She would be most comfortable with going back on Tecfidera and, she feels in retrospect, that she was stable while on it.  She would also be interested in trying Mayzent or Gilenya if she does not do well when she switches back to Tecfidera.  We also discussed today that the main purpose of a disease modifying therapy is to prevent new relapses and slowing down progression.  Most of the time people do not feel significantly better on a disease modifying therapy but it alters the disease course.  She feels her fatigue has done worse.  She gets tired with stress short burst of effort.  She continues to note left greater than right leg weakness and numbness.  Her gait is off balance and she follows a couple times a week.  From 08/08/2017: She was diagnosed with MS in 2016 after presenting with visual disturbance, diplopia and poor gait.    She was initially placed on Tecfidera.   She did not note any benefit so wanted to switch medications.   She felt she is doing no better with the Ocrevus and wonders what medication would be best for her.     Currently, she has left > right leg weakness.   She denies spasticity.   Gait is off  balanced and she falls every week or so.     She has numbness in the left leg.    Leg numbness is fairly constant and she gets occasional left hand numbness.     She has had urgency and some incontinence with urine x one year.    She has rare bowel incontinence.      She notes a lot of fatigue and even has to take a break to change her sheets or other chores.    She feels sleepy many days.  She gets 6 hours most nights. Amitriptyline helps her fall asleep.    She was once on phentermine and it may  have helped her fatigue slightly.   However, insurance will not cover it.     She has been told that she snores some.   She has never been told that she has pauses or snorts/gasps.    She had a sleep study and did not have OSA.     However,  she has gained some weight.   She notes depression and anxiety.   She has had some panic attacks.     She feels her cognition is mildly impaired.   She has occasional difficulty with memory.      MRI's 05/13/2016 and 05/16/2016 were personally reviewed and I concur with the readings.:  IMPRESSION:  This MRI of the brain with and without contrast shows the following: 1.   Multiple T2/FLAIR hyperintense foci in the pons and hemispheres in a pattern and configuration consistent with chronic demyelinating plaque associated with multiple sclerosis. None of the foci appears to be acute. When compared to the MRI dated 01/11/2015, there is no interval change. 2.    There is a normal enhancement pattern and there are no acute findings.   IMPRESSION:  This MRI of the cervical spine with and without contrast shows the following: 1.    Foci within the posterior spinal cord adjacent to C3 and the left spinal cord adjacent to C5.   Neither of these appear to be acute. 2.    Minimal disc degenerative changes at C5-C6 and C6-C7 that did not lead to any nerve root impingement. 3.    There is a normal enhancement pattern and there are no acute findings.  IMPRESSION:  This MRI of the  thoracic spine with and without contrast shows the following: 1.   There is a focus within the left anterior spinal cord adjacent to T8-T9 consistent with a demyelinating plaque associated with multiple sclerosis. It appears to be chronic and does not enhance. There is a small focus to the right adjacent to T3-T4.   It is noted on the axial images but not the sagittal images and could represent a small chronic demyelinating plaque or be artifactual. 2.   There is small right lateral disc protrusion at T11-T12. There is no nerve root compression.. 3.    There is a normal enhancement pattern.   REVIEW OF SYSTEMS: Constitutional: No fevers, chills, sweats, or change in appetite.  She notes fatigue. Eyes: No visual changes, double vision, eye pain Ear, nose and throat: No hearing loss, ear pain, nasal congestion, sore throat Cardiovascular: No chest pain, palpitations Respiratory: No shortness of breath at rest or with exertion.   No wheezes GastrointestinaI: No nausea, vomiting, diarrhea, abdominal pain, fecal incontinence Genitourinary: No dysuria, urinary retention or frequency.  No nocturia. Musculoskeletal: No neck pain, back pain Integumentary: No rash, pruritus, skin lesions Neurological: as above Psychiatric: She denies depression or anxiety.   Endocrine: No palpitations, diaphoresis, change in appetite, change in weigh or increased thirst Hematologic/Lymphatic: No anemia, purpura, petechiae. Allergic/Immunologic: No itchy/runny eyes, nasal congestion, recent allergic reactions, rashes  ALLERGIES: Allergies  Allergen Reactions   Lexapro [Escitalopram Oxalate] Other (See Comments)    Suicidal thoughts, lowered libido   Tysabri [Natalizumab] Hives    Hives, itching, elevated HR and BP.   Latex Hives    HOME MEDICATIONS:  Current Outpatient Medications:    Acetaminophen (TYLENOL PO), Take by mouth. As needed for knee pain., Disp: , Rfl:    ALPRAZolam (XANAX) 0.5 MG  tablet, Take 0.5 mg by mouth daily as needed.,  Disp: , Rfl: 2   amitriptyline (ELAVIL) 25 MG tablet, Take 25 mg by mouth at bedtime as needed. for sleep, Disp: , Rfl: 2   amphetamine-dextroamphetamine (ADDERALL XR) 30 MG 24 hr capsule, Take 1 capsule (30 mg total) by mouth daily., Disp: 30 capsule, Rfl: 0   calcium-vitamin D 250-100 MG-UNIT tablet, Take 1 tablet by mouth 2 (two) times daily., Disp: 60 tablet, Rfl: 5   cetirizine (ZYRTEC) 10 MG tablet, Take 10 mg by mouth daily as needed., Disp: , Rfl: 5   dalfampridine 10 MG TB12, One po bid, Disp: 60 tablet, Rfl: 11   diclofenac (VOLTAREN) 75 MG EC tablet, Take 1 tablet (75 mg total) by mouth 2 (two) times daily., Disp: 20 tablet, Rfl: 0   Dimethyl Fumarate (TECFIDERA) 240 MG CPDR, Take 240 mg by mouth 2 (two) times daily., Disp: , Rfl:    ferrous sulfate 325 (65 FE) MG tablet, Take 1 tablet (325 mg total) by mouth every morning., Disp: 90 tablet, Rfl: 3   fluticasone (FLONASE) 50 MCG/ACT nasal spray, Place 1 spray into both nostrils daily as needed for allergies or rhinitis., Disp: , Rfl:    gabapentin (NEURONTIN) 300 MG capsule, TAKE 2 CAPSULES (600 MG TOTAL) BY MOUTH 3 (THREE) TIMES DAILY., Disp: 180 capsule, Rfl: 5   HYDROcodone-acetaminophen (NORCO/VICODIN) 5-325 MG tablet, Take 1 tablet by mouth every 4 (four) hours as needed., Disp: 15 tablet, Rfl: 0   metroNIDAZOLE (FLAGYL) 500 MG tablet, Take 1 tablet (500 mg total) by mouth 2 (two) times daily., Disp: 14 tablet, Rfl: 0   Multiple Vitamin (MULTIVITAMIN) tablet, Take 1 tablet by mouth daily., Disp: , Rfl:    naproxen (NAPROSYN) 500 MG tablet, Take 1 tablet (500 mg total) by mouth 2 (two) times daily with a meal., Disp: 60 tablet, Rfl: 5   tiZANidine (ZANAFLEX) 4 MG tablet, Take 4 mg by mouth 2 (two) times daily as needed., Disp: , Rfl: 2  PAST MEDICAL HISTORY: Past Medical History:  Diagnosis Date   Anxiety    Chronic back pain    Depression    Diabetes mellitus  without complication (HCC)    Edema    MS (multiple sclerosis) (HCC) 09/29/2014    PAST SURGICAL HISTORY: Past Surgical History:  Procedure Laterality Date   APPENDECTOMY     CHOLECYSTECTOMY N/A 10/07/2013   Procedure: LAPAROSCOPIC CHOLECYSTECTOMY;  Surgeon: Dalia Heading, MD;  Location: [facility];  Service: General;  Laterality: N/A;   LAPAROSCOPIC APPENDECTOMY N/A 05/12/2015   Procedure: APPENDECTOMY LAPAROSCOPIC;  Surgeon: Franky Macho, MD;  Location: [facility];  Service: General;  Laterality: N/A;   WISDOM TOOTH EXTRACTION      FAMILY HISTORY: Family History  Problem Relation Age of Onset   Diabetes Mother    Colon polyps Mother        hx of cancer   Other Mother        DDD Lumber, cervical   Sleep apnea Father     SOCIAL HISTORY:  Social History   Socioeconomic History   Marital status: Single    Spouse name: Not on file   Number of children: Not on file   Years of education: 2 y colleg   Highest education level: Not on file  Occupational History   Occupation: work  Ecologist strain: Not on file   Food insecurity    Worry: Not on file    Inability: Not on file   Transportation needs  Medical: Not on file    Non-medical: Not on file  Tobacco Use   Smoking status: Former Smoker    Quit date: 09/16/2007    Years since quitting: 10.9   Smokeless tobacco: Never Used  Substance and Sexual Activity   Alcohol use: No    Alcohol/week: 0.0 standard drinks   Drug use: No    Types: Marijuana    Comment: no drug use since 2010   Sexual activity: Yes    Birth control/protection: None  Lifestyle   Physical activity    Days per week: Not on file    Minutes per session: Not on file   Stress: Not on file  Relationships   Social connections    Talks on phone: Not on file    Gets together: Not on file    Attends religious service: Not on file    Active member of club or organization: Not on file    Attends meetings of  clubs or organizations: Not on file    Relationship status: Not on file   Intimate partner violence    Fear of current or ex partner: Not on file    Emotionally abused: Not on file    Physically abused: Not on file    Forced sexual activity: Not on file  Other Topics Concern   Not on file  Social History Narrative   Drinks 1-2 large glasses of caffeine daily.        PHYSICAL EXAM  There were no vitals filed for this visit.  There is no height or weight on file to calculate BMI.   General: The patient is well-developed and well-nourished and in no acute distress   Skin: Extremities are without rash or edema.   Neurologic Exam  Mental status: The patient is alert and oriented x 3 at the time of the examination. The patient has apparent normal recent and remote memory, with an apparently normal attention span and concentration ability.   Speech is normal.  Cranial nerves: Extraocular movements are full.  Facial strength and sensation is normal.  Trapezius strength is normal.. No obvious hearing deficits are noted.  Motor:  Muscle bulk is normal.   Tone is mildly increased in the left leg.  Strength was 4/5 in the left leg.  Sensory: She has reduced sensation to touch and vibration in the left leg relative to the right.  Touch sensation was mildly reduced in the left arm relative to the right.  Coordination: Cerebellar testing reveals good finger-nose-finger and heel-to-shin bilaterally.  Gait and station: Station is normal.  The gait is wide and she has a left foot drop.  She cannot do a tandem walk.  The Romberg is negative..   Reflexes: Deep tendon reflexes are symmetric and normal in the arms and increase at the knees..   25 foot timed walk (average of 2 trials) is 12.2 seconds    DIAGNOSTIC DATA (LABS, IMAGING, TESTING) - I reviewed patient records, labs, notes, testing and imaging myself where available.  Lab Results  Component Value Date   WBC 5.6 02/25/2018    HGB 11.9 (L) 02/25/2018   HCT 37.1 02/25/2018   MCV 88.3 02/25/2018   PLT 271 02/25/2018      Component Value Date/Time   NA 136 02/25/2018 1650   NA 138 04/24/2017 1028   K 3.8 02/25/2018 1650   CL 102 02/25/2018 1650   CO2 30 02/25/2018 1650   GLUCOSE 98 02/25/2018 1650   BUN 9 02/25/2018  1650   BUN 12 04/24/2017 1028   CREATININE 0.84 02/25/2018 1650   CALCIUM 8.5 (L) 02/25/2018 1650   PROT 7.7 07/28/2017 0119   PROT 7.2 04/24/2017 1028   ALBUMIN 3.5 07/28/2017 0119   ALBUMIN 3.8 04/24/2017 1028   AST 17 07/28/2017 0119   ALT 17 07/28/2017 0119   ALKPHOS 64 07/28/2017 0119   BILITOT 0.6 07/28/2017 0119   BILITOT 0.3 04/24/2017 1028   GFRNONAA >60 02/25/2018 1650   GFRAA >60 02/25/2018 1650      Isra Lindy A. Epimenio FootSater, MD, PhD, FAAN Certified in Neurology, Clinical Neurophysiology, Sleep Medicine, Pain Medicine and Neuroimaging Director, [facility]  [facility] [address], [address] [place], [place] [address] [phone]

## 2018-08-21 ENCOUNTER — Ambulatory Visit
Admission: RE | Admit: 2018-08-21 | Discharge: 2018-08-21 | Disposition: A | Discharge status: Home / Self Care | Payer: Medicaid Other | Source: Ambulatory Visit | Attending: Orthopaedic Surgery | Admitting: Orthopaedic Surgery

## 2018-08-21 DIAGNOSIS — G8929 Other chronic pain: Secondary | ICD-10-CM

## 2018-08-21 DIAGNOSIS — M25562 Pain in left knee: Secondary | ICD-10-CM

## 2018-08-23 ENCOUNTER — Ambulatory Visit: Payer: Medicaid Other | Admitting: Orthopaedic Surgery

## 2018-08-23 ENCOUNTER — Encounter: Payer: Self-pay | Admitting: Orthopaedic Surgery

## 2018-08-23 ENCOUNTER — Other Ambulatory Visit: Payer: Self-pay

## 2018-08-23 VITALS — BP 130/80 | HR 88 | Temp 98.2°F | Ht 64.0 in | Wt 343.0 lb

## 2018-08-23 DIAGNOSIS — G8929 Other chronic pain: Secondary | ICD-10-CM | POA: Diagnosis not present

## 2018-08-23 DIAGNOSIS — M25562 Pain in left knee: Secondary | ICD-10-CM | POA: Diagnosis not present

## 2018-08-23 DIAGNOSIS — Z6841 Body Mass Index (BMI) 40.0 and over, adult: Secondary | ICD-10-CM

## 2018-08-23 NOTE — Progress Notes (Signed)
Patient ZO:XWRUEA:Brooke [name], female DOB:Nov 22, 1985, 33 y.o. VWU:[id number]  Chief Complaint  Patient presents with  . Knee Pain    L/ painful/go over MRI    HPI  Brooke [name] is a 33 y.o. female who has left knee pain and swelling.  MRI was negative.  I have explained the findings to her.  She is to continue the Naprosyn. She does not need surgery.   Body mass index is 58.88 kg/m.  The patient meets the AMA guidelines for Morbid (severe) obesity with a BMI > 40.0 and I have recommended weight loss.   ROS  Review of Systems  Constitutional: Positive for activity change.  HENT: Negative for congestion.   Respiratory: Negative for cough and shortness of breath.   Cardiovascular: Negative for chest pain and leg swelling.  Endocrine: Positive for cold intolerance.  Musculoskeletal: Positive for arthralgias, back pain, gait problem and joint swelling.  Allergic/Immunologic: Positive for environmental allergies.  Psychiatric/Behavioral: The patient is nervous/anxious.   All other systems reviewed and are negative.   All other systems reviewed and are negative.  The following is a summary of the past history medically, past history surgically, known current medicines, social history and family history.  This information is gathered electronically by the computer from prior information and documentation.  I review this each visit and have found including this information at this point in the chart is beneficial and informative.    Past Medical History:  Diagnosis Date  . Anxiety   . Chronic back pain   . Depression   . Diabetes mellitus without complication (HCC)   . Edema   . MS (multiple sclerosis) (HCC) 09/29/2014    Past Surgical History:  Procedure Laterality Date  . APPENDECTOMY    . CHOLECYSTECTOMY N/A 10/07/2013   Procedure: LAPAROSCOPIC CHOLECYSTECTOMY;  Surgeon: Dalia HeadingMark A Jenkins, MD;  Location: [facility];  Service: General;  Laterality: N/A;  . LAPAROSCOPIC  APPENDECTOMY N/A 05/12/2015   Procedure: APPENDECTOMY LAPAROSCOPIC;  Surgeon: Franky MachoMark Jenkins, MD;  Location: [facility];  Service: General;  Laterality: N/A;  . WISDOM TOOTH EXTRACTION      Family History  Problem Relation Age of Onset  . Diabetes Mother   . Colon polyps Mother        hx of cancer  . Other Mother        DDD Lumber, cervical  . Sleep apnea Father     Social History Social History   Tobacco Use  . Smoking status: Former Smoker    Quit date: 09/16/2007    Years since quitting: 10.9  . Smokeless tobacco: Never Used  Substance Use Topics  . Alcohol use: No    Alcohol/week: 0.0 standard drinks  . Drug use: No    Types: Marijuana    Comment: no drug use since 2010    Allergies  Allergen Reactions  . Lexapro [Escitalopram Oxalate] Other (See Comments)    Suicidal thoughts, lowered libido  . Tysabri [Natalizumab] Hives    Hives, itching, elevated HR and BP.  [name]. Latex Hives    Current Outpatient Medications  Medication Sig Dispense Refill  . Acetaminophen (TYLENOL PO) Take by mouth. As needed for knee pain.    [name]. ALPRAZolam (XANAX) 0.5 MG tablet Take 0.5 mg by mouth daily as needed.  2  . amitriptyline (ELAVIL) 25 MG tablet Take 25 mg by mouth at bedtime as needed. for sleep  2  . amphetamine-dextroamphetamine (ADDERALL XR) 30 MG 24 hr capsule Take 1 capsule (  30 mg total) by mouth daily. 30 capsule 0  . calcium-vitamin D 250-100 MG-UNIT tablet Take 1 tablet by mouth 2 (two) times daily. 60 tablet 5  . cetirizine (ZYRTEC) 10 MG tablet Take 10 mg by mouth daily as needed.  5  . dalfampridine 10 MG TB12 One po bid 60 tablet 11  . diclofenac (VOLTAREN) 75 MG EC tablet Take 1 tablet (75 mg total) by mouth 2 (two) times daily. 20 tablet 0  . Dimethyl Fumarate (TECFIDERA) 240 MG CPDR Take 240 mg by mouth 2 (two) times daily.    . ferrous sulfate 325 (65 FE) MG tablet Take 1 tablet (325 mg total) by mouth every morning. 90 tablet 3  . fluticasone (FLONASE) 50 MCG/ACT nasal  spray Place 1 spray into both nostrils daily as needed for allergies or rhinitis.    [name] gabapentin (NEURONTIN) 300 MG capsule TAKE 2 CAPSULES (600 MG TOTAL) BY MOUTH 3 (THREE) TIMES DAILY. 180 capsule 5  . HYDROcodone-acetaminophen (NORCO/VICODIN) 5-325 MG tablet Take 1 tablet by mouth every 4 (four) hours as needed. 15 tablet 0  . metroNIDAZOLE (FLAGYL) 500 MG tablet Take 1 tablet (500 mg total) by mouth 2 (two) times daily. 14 tablet 0  . Multiple Vitamin (MULTIVITAMIN) tablet Take 1 tablet by mouth daily.    . naproxen (NAPROSYN) 500 MG tablet Take 1 tablet (500 mg total) by mouth 2 (two) times daily with a meal. 60 tablet 5  . tiZANidine (ZANAFLEX) 4 MG tablet Take 4 mg by mouth 2 (two) times daily as needed.  2   No current facility-administered medications for this visit.      Physical Exam  Blood pressure 130/80, pulse 88, temperature 98.2 F (36.8 C), height 5\' 4"  (1.626 m), weight (!) 343 lb (155.6 kg).  Constitutional: overall normal hygiene, normal nutrition, well developed, normal grooming, normal body habitus. Assistive device:none  Musculoskeletal: gait and station Limp left, muscle tone and strength are normal, no tremors or atrophy is present.  .  Neurological: coordination overall normal.  Deep tendon reflex/nerve stretch intact.  Sensation normal.  Cranial nerves II-XII intact.   Skin:   Normal overall no scars, lesions, ulcers or rashes. No psoriasis.  Psychiatric: Alert and oriented x 3.  Recent memory intact, remote memory unclear.  Normal mood and affect. Well groomed.  Good eye contact.  Cardiovascular: overall no swelling, no varicosities, no edema bilaterally, normal temperatures of the legs and arms, no clubbing, cyanosis and good capillary refill.  Lymphatic: palpation is normal.  Left knee has pain, ROM 0 to 110, crepitus, effusion. NV intact.  All other systems reviewed and are negative   The patient has been educated about the nature of the problem(s)  and counseled on treatment options.  The patient appeared to understand what I have discussed and is in agreement with it.  Encounter Diagnoses  Name Primary?  . Chronic pain of left knee Yes  . Body mass index 50.0-59.9, adult ([place])   . Morbid obesity ([place])     PLAN Call if any problems.  Precautions discussed.  Continue current medications. Continue Naprosyn  Return to clinic 1 month   Electronically Signed Sanjuana Kava, MD 7/2/202010:11 AM

## 2018-09-04 ENCOUNTER — Telehealth: Payer: Self-pay | Admitting: Women's Health

## 2018-09-04 NOTE — Telephone Encounter (Signed)
Patient states she is having a vaginal odor with discharge and wants to be seen.  Informed we do not have any appointments at this time but she can come in for self swab.  Pt verbalized understanding and will come tomorrow.

## 2018-09-04 NOTE — Telephone Encounter (Signed)
Pt calling and states she is having vaginal odor and requesting an appt.

## 2018-09-05 ENCOUNTER — Other Ambulatory Visit: Payer: Medicaid Other

## 2018-09-05 ENCOUNTER — Other Ambulatory Visit: Payer: Self-pay

## 2018-09-05 DIAGNOSIS — N898 Other specified noninflammatory disorders of vagina: Secondary | ICD-10-CM

## 2018-09-05 NOTE — Progress Notes (Signed)
Pt in for self swab for vaginal odor and discharge.

## 2018-09-14 LAB — NUSWAB VAGINITIS PLUS (VG+)
Atopobium vaginae: HIGH Score — AB
BVAB 2: HIGH Score — AB
Candida albicans, NAA: POSITIVE — AB
Candida glabrata, NAA: NEGATIVE
Chlamydia trachomatis, NAA: NEGATIVE
Megasphaera 1: HIGH Score — AB
Neisseria gonorrhoeae, NAA: NEGATIVE
Trich vag by NAA: NEGATIVE

## 2018-09-17 ENCOUNTER — Other Ambulatory Visit: Payer: Self-pay | Admitting: Advanced Practice Midwife

## 2018-09-17 MED ORDER — FLUCONAZOLE 150 MG PO TABS: ORAL_TABLET | ORAL | 2 refills | Status: DC

## 2018-09-17 MED ORDER — METRONIDAZOLE 500 MG PO TABS: 500.0000 mg | ORAL_TABLET | Freq: Two times a day (BID) | ORAL | 0 refills | Status: DC

## 2018-09-20 ENCOUNTER — Encounter: Payer: Self-pay | Admitting: Orthopaedic Surgery

## 2018-09-20 ENCOUNTER — Other Ambulatory Visit: Payer: Self-pay

## 2018-09-20 ENCOUNTER — Ambulatory Visit: Payer: Medicaid Other | Admitting: Orthopaedic Surgery

## 2018-09-20 VITALS — BP 105/55 | HR 77 | Temp 98.8°F | Ht 64.0 in | Wt 341.0 lb

## 2018-09-20 DIAGNOSIS — Z6841 Body Mass Index (BMI) 40.0 and over, adult: Secondary | ICD-10-CM | POA: Diagnosis not present

## 2018-09-20 DIAGNOSIS — M25562 Pain in left knee: Secondary | ICD-10-CM | POA: Diagnosis not present

## 2018-09-20 DIAGNOSIS — G8929 Other chronic pain: Secondary | ICD-10-CM | POA: Diagnosis not present

## 2018-09-20 MED ORDER — PREDNISONE 5 MG (21) PO TBPK: ORAL_TABLET | ORAL | 0 refills | Status: DC

## 2018-09-20 NOTE — Progress Notes (Signed)
Patient Brooke [name]:Brooke [name], female DOB:[birth year], 33 y.o. VWU:[id number]  Chief Complaint  Patient presents with  . Knee Pain    Chronic left knee pain.    HPI  [name] is a 33 y.o. female who continues to have pain in the left knee.  MRI was negative.  She has medial pain.  She has tried Naprosyn, rubs, injection and still has pain.  She is tired of hurting.     Body mass index is 58.53 kg/m.  The patient meets the AMA guidelines for Morbid (severe) obesity with a BMI > 40.0 and I have recommended weight loss.   ROS  Review of Systems  Constitutional: Positive for activity change.  HENT: Negative for congestion.   Respiratory: Negative for cough and shortness of breath.   Cardiovascular: Negative for chest pain and leg swelling.  Endocrine: Positive for cold intolerance.  Musculoskeletal: Positive for arthralgias, back pain, gait problem and joint swelling.  Allergic/Immunologic: Positive for environmental allergies.  Psychiatric/Behavioral: The patient is nervous/anxious.   All other systems reviewed and are negative.   All other systems reviewed and are negative.  The following is a summary of the past history medically, past history surgically, known current medicines, social history and family history.  This information is gathered electronically by the computer from prior information and documentation.  I review this each visit and have found including this information at this point in the chart is beneficial and informative.    Past Medical History:  Diagnosis Date  . Anxiety   . Chronic back pain   . Depression   . Diabetes mellitus without complication (HCC)   . Edema   . MS (multiple sclerosis) (HCC) 09/29/2014    Past Surgical History:  Procedure Laterality Date  . APPENDECTOMY    . CHOLECYSTECTOMY N/A 10/07/2013   Procedure: LAPAROSCOPIC CHOLECYSTECTOMY;  Surgeon: Dalia HeadingMark A Jenkins, MD;  Location: [facility];  Service: General;  Laterality: N/A;  .  LAPAROSCOPIC APPENDECTOMY N/A 05/12/2015   Procedure: APPENDECTOMY LAPAROSCOPIC;  Surgeon: Franky MachoMark Jenkins, MD;  Location: [facility];  Service: General;  Laterality: N/A;  . WISDOM TOOTH EXTRACTION      Family History  Problem Relation Age of Onset  . Diabetes Mother   . Colon polyps Mother        hx of cancer  . Other Mother        DDD Lumber, cervical  . Sleep apnea Father     Social History Social History   Tobacco Use  . Smoking status: Former Smoker    Quit date: 09/16/2007    Years since quitting: 11.0  . Smokeless tobacco: Never Used  Substance Use Topics  . Alcohol use: No    Alcohol/week: 0.0 standard drinks  . Drug use: No    Types: Marijuana    Comment: no drug use since 2010    Allergies  Allergen Reactions  . Lexapro [Escitalopram Oxalate] Other (See Comments)    Suicidal thoughts, lowered libido  . Tysabri [Natalizumab] Hives    Hives, itching, elevated HR and BP.  [name]. Latex Hives    Current Outpatient Medications  Medication Sig Dispense Refill  . Acetaminophen (TYLENOL PO) Take by mouth. As needed for knee pain.    [name]. ALPRAZolam (XANAX) 0.5 MG tablet Take 0.5 mg by mouth daily as needed.  2  . amitriptyline (ELAVIL) 25 MG tablet Take 25 mg by mouth at bedtime as needed. for sleep  2  . amphetamine-dextroamphetamine (ADDERALL XR) 30 MG  24 hr capsule Take 1 capsule (30 mg total) by mouth daily. 30 capsule 0  . calcium-vitamin D 250-100 MG-UNIT tablet Take 1 tablet by mouth 2 (two) times daily. 60 tablet 5  . cetirizine (ZYRTEC) 10 MG tablet Take 10 mg by mouth daily as needed.  5  . dalfampridine 10 MG TB12 One po bid 60 tablet 11  . diclofenac (VOLTAREN) 75 MG EC tablet Take 1 tablet (75 mg total) by mouth 2 (two) times daily. 20 tablet 0  . Dimethyl Fumarate (TECFIDERA) 240 MG CPDR Take 240 mg by mouth 2 (two) times daily.    . ferrous sulfate 325 (65 FE) MG tablet Take 1 tablet (325 mg total) by mouth every morning. 90 tablet 3  . fluconazole (DIFLUCAN) 150  MG tablet 1 po stat; repeat in 3 days 2 tablet 2  . fluticasone (FLONASE) 50 MCG/ACT nasal spray Place 1 spray into both nostrils daily as needed for allergies or rhinitis.    [name] gabapentin (NEURONTIN) 300 MG capsule TAKE 2 CAPSULES (600 MG TOTAL) BY MOUTH 3 (THREE) TIMES DAILY. 180 capsule 5  . HYDROcodone-acetaminophen (NORCO/VICODIN) 5-325 MG tablet Take 1 tablet by mouth every 4 (four) hours as needed. 15 tablet 0  . metroNIDAZOLE (FLAGYL) 500 MG tablet Take 1 tablet (500 mg total) by mouth 2 (two) times daily. 14 tablet 0  . Multiple Vitamin (MULTIVITAMIN) tablet Take 1 tablet by mouth daily.    . naproxen (NAPROSYN) 500 MG tablet Take 1 tablet (500 mg total) by mouth 2 (two) times daily with a meal. 60 tablet 5  . predniSONE (STERAPRED UNI-PAK 21 TAB) 5 MG (21) TBPK tablet Take 6 pills first day; 5 pills second day; 4 pills third day; 3 pills fourth day; 2 pills next day and 1 pill last day. 21 tablet 0  . tiZANidine (ZANAFLEX) 4 MG tablet Take 4 mg by mouth 2 (two) times daily as needed.  2   No current facility-administered medications for this visit.      Physical Exam  Blood pressure (!) 105/55, pulse 77, height 5\' 4"  (1.626 m), weight (!) 341 lb (154.7 kg).  Constitutional: overall normal hygiene, normal nutrition, well developed, normal grooming, normal body habitus. Assistive device:none  Musculoskeletal: gait and station Limp left, muscle tone and strength are normal, no tremors or atrophy is present.  .  Neurological: coordination overall normal.  Deep tendon reflex/nerve stretch intact.  Sensation normal.  Cranial nerves II-XII intact.   Skin:   Normal overall no scars, lesions, ulcers or rashes. No psoriasis.  Psychiatric: Alert and oriented x 3.  Recent memory intact, remote memory unclear.  Normal mood and affect. Well groomed.  Good eye contact.  Cardiovascular: overall no swelling, no varicosities, no edema bilaterally, normal temperatures of the legs and arms, no  clubbing, cyanosis and good capillary refill.  Lymphatic: palpation is normal.  Left knee has effusion, crepitus, medial joint line pain,ROM 0 to 105, limp left, NV intact. Knee is stable.  All other systems reviewed and are negative   The patient has been educated about the nature of the problem(s) and counseled on treatment options.  The patient appeared to understand what I have discussed and is in agreement with it.  Encounter Diagnoses  Name Primary?  . Chronic pain of left knee Yes  . Body mass index 50.0-59.9, adult (HCC)   . Morbid obesity (HCC)     PLAN Call if any problems.  Precautions discussed.  Continue current medications.  Return to clinic five weeks.    I will add prednisone dose pack.  Electronically Signed Sanjuana Kava, MD 7/30/202010:18 AM

## 2018-10-25 ENCOUNTER — Ambulatory Visit: Payer: Medicaid Other

## 2018-10-25 ENCOUNTER — Encounter: Payer: Self-pay | Admitting: Orthopaedic Surgery

## 2018-10-25 ENCOUNTER — Ambulatory Visit: Payer: Medicaid Other | Admitting: Orthopaedic Surgery

## 2018-10-25 ENCOUNTER — Other Ambulatory Visit: Payer: Self-pay

## 2018-10-25 VITALS — BP 114/44 | HR 71 | Ht 64.0 in | Wt 343.0 lb

## 2018-10-25 DIAGNOSIS — M25511 Pain in right shoulder: Secondary | ICD-10-CM

## 2018-10-25 DIAGNOSIS — M25562 Pain in left knee: Secondary | ICD-10-CM | POA: Diagnosis not present

## 2018-10-25 DIAGNOSIS — Z6841 Body Mass Index (BMI) 40.0 and over, adult: Secondary | ICD-10-CM

## 2018-10-25 DIAGNOSIS — G8929 Other chronic pain: Secondary | ICD-10-CM

## 2018-10-25 MED ORDER — NAPROXEN 500 MG PO TABS: 500.0000 mg | ORAL_TABLET | Freq: Two times a day (BID) | ORAL | 5 refills | Status: DC

## 2018-10-25 NOTE — Progress Notes (Signed)
Patient ZO:XWRUEA:Eudora L Tantillo, female DOB:Nov 08, 1985, 33 y.o. VWU:[id number]  Chief Complaint  Patient presents with  . Knee Pain    left  . Shoulder Pain    right     HPI  Marleny L Mcglasson is a 33 y.o. female who has left knee pain and buckling.  MRI was negative in past. She still has the problem.  She has no new trauma.  She is having pain in the right shoulder.  It hurts with overhead use and sleeping on it. She has no trauma, no redness, no swelling, no numbness.   Body mass index is 58.88 kg/m.  The patient meets the AMA guidelines for Morbid (severe) obesity with a BMI > 40.0 and I have recommended weight loss.   ROS  Review of Systems  Constitutional: Positive for activity change.  HENT: Negative for congestion.   Respiratory: Negative for cough and shortness of breath.   Cardiovascular: Negative for chest pain and leg swelling.  Endocrine: Positive for cold intolerance.  Musculoskeletal: Positive for arthralgias, back pain, gait problem and joint swelling.  Allergic/Immunologic: Positive for environmental allergies.  Psychiatric/Behavioral: The patient is nervous/anxious.   All other systems reviewed and are negative.   All other systems reviewed and are negative.  The following is a summary of the past history medically, past history surgically, known current medicines, social history and family history.  This information is gathered electronically by the computer from prior information and documentation.  I review this each visit and have found including this information at this point in the chart is beneficial and informative.    Past Medical History:  Diagnosis Date  . Anxiety   . Chronic back pain   . Depression   . Diabetes mellitus without complication (HCC)   . Edema   . MS (multiple sclerosis) (HCC) 09/29/2014    Past Surgical History:  Procedure Laterality Date  . APPENDECTOMY    . CHOLECYSTECTOMY N/A 10/07/2013   Procedure: LAPAROSCOPIC  CHOLECYSTECTOMY;  Surgeon: Dalia HeadingMark A Jenkins, MD;  Location: [facility];  Service: General;  Laterality: N/A;  . LAPAROSCOPIC APPENDECTOMY N/A 05/12/2015   Procedure: APPENDECTOMY LAPAROSCOPIC;  Surgeon: Franky MachoMark Jenkins, MD;  Location: [facility];  Service: General;  Laterality: N/A;  . WISDOM TOOTH EXTRACTION      Family History  Problem Relation Age of Onset  . Diabetes Mother   . Colon polyps Mother        hx of cancer  . Other Mother        DDD Lumber, cervical  . Sleep apnea Father     Social History Social History   Tobacco Use  . Smoking status: Former Smoker    Quit date: 09/16/2007    Years since quitting: 11.1  . Smokeless tobacco: Never Used  Substance Use Topics  . Alcohol use: No    Alcohol/week: 0.0 standard drinks  . Drug use: No    Types: Marijuana    Comment: no drug use since 2010    Allergies  Allergen Reactions  . Lexapro [Escitalopram Oxalate] Other (See Comments)    Suicidal thoughts, lowered libido  . Tysabri [Natalizumab] Hives    Hives, itching, elevated HR and BP.  [name]. Latex Hives    Current Outpatient Medications  Medication Sig Dispense Refill  . Acetaminophen (TYLENOL PO) Take by mouth. As needed for knee pain.    [name]. ALPRAZolam (XANAX) 0.5 MG tablet Take 0.5 mg by mouth daily as needed.  2  . amitriptyline (ELAVIL) 25  MG tablet Take 25 mg by mouth at bedtime as needed. for sleep  2  . amphetamine-dextroamphetamine (ADDERALL XR) 30 MG 24 hr capsule Take 1 capsule (30 mg total) by mouth daily. 30 capsule 0  . calcium-vitamin D 250-100 MG-UNIT tablet Take 1 tablet by mouth 2 (two) times daily. 60 tablet 5  . cetirizine (ZYRTEC) 10 MG tablet Take 10 mg by mouth daily as needed.  5  . dalfampridine 10 MG TB12 One po bid 60 tablet 11  . Dimethyl Fumarate (TECFIDERA) 240 MG CPDR Take 240 mg by mouth 2 (two) times daily.    . ferrous sulfate 325 (65 FE) MG tablet Take 1 tablet (325 mg total) by mouth every morning. 90 tablet 3  . fluticasone (FLONASE) 50 MCG/ACT  nasal spray Place 1 spray into both nostrils daily as needed for allergies or rhinitis.    [name]. gabapentin (NEURONTIN) 300 MG capsule TAKE 2 CAPSULES (600 MG TOTAL) BY MOUTH 3 (THREE) TIMES DAILY. 180 capsule 5  . HYDROcodone-acetaminophen (NORCO/VICODIN) 5-325 MG tablet Take 1 tablet by mouth every 4 (four) hours as needed. 15 tablet 0  . metroNIDAZOLE (FLAGYL) 500 MG tablet Take 1 tablet (500 mg total) by mouth 2 (two) times daily. 14 tablet 0  . Multiple Vitamin (MULTIVITAMIN) tablet Take 1 tablet by mouth daily.    . naproxen (NAPROSYN) 500 MG tablet Take 1 tablet (500 mg total) by mouth 2 (two) times daily with a meal. 60 tablet 5  . predniSONE (STERAPRED UNI-PAK 21 TAB) 5 MG (21) TBPK tablet Take 6 pills first day; 5 pills second day; 4 pills third day; 3 pills fourth day; 2 pills next day and 1 pill last day. 21 tablet 0  . tiZANidine (ZANAFLEX) 4 MG tablet Take 4 mg by mouth 2 (two) times daily as needed.  2  . fluconazole (DIFLUCAN) 150 MG tablet 1 po stat; repeat in 3 days (Patient not taking: Reported on 10/25/2018) 2 tablet 2   No current facility-administered medications for this visit.      Physical Exam  Blood pressure (!) 114/44, pulse 71, height 5\' 4"  (1.626 m), weight (!) 343 lb (155.6 kg), last menstrual period 09/25/2018.  Constitutional: overall normal hygiene, normal nutrition, well developed, normal grooming, normal body habitus. Assistive device:none  Musculoskeletal: gait and station Limp left, muscle tone and strength are normal, no tremors or atrophy is present.  .  Neurological: coordination overall normal.  Deep tendon reflex/nerve stretch intact.  Sensation normal.  Cranial nerves II-XII intact.   Skin:   Normal overall no scars, lesions, ulcers or rashes. No psoriasis.  Psychiatric: Alert and oriented x 3.  Recent memory intact, remote memory unclear.  Normal mood and affect. Well groomed.  Good eye contact.  Cardiovascular: overall no swelling, no varicosities,  no edema bilaterally, normal temperatures of the legs and arms, no clubbing, cyanosis and good capillary refill.  Lymphatic: palpation is normal.  Left knee has slight effusion, crepitus, nearly full ROM, no distal edema.  NV intact.  Right shoulder has full ROM but pain in the extremes.  NV intact.  Grips normal.  Neck negative.  All other systems reviewed and are negative   The patient has been educated about the nature of the problem(s) and counseled on treatment options.  The patient appeared to understand what I have discussed and is in agreement with it.  Encounter Diagnoses  Name Primary?  . Chronic pain in right shoulder Yes  . Chronic pain of left  knee   . Body mass index 50.0-59.9, adult ([place])   . Morbid obesity (HCC)    X-rays were done of the right shoulder, reported separately.  PROCEDURE NOTE:  The patient request injection, verbal consent was obtained.  The right shoulder was prepped appropriately after time out was performed.   Sterile technique was observed and injection of 1 cc of Depo-Medrol 40 mg with several cc's of plain xylocaine. Anesthesia was provided by ethyl chloride and a 20-gauge needle was used to inject the shoulder area. A posterior approach was used.  The injection was tolerated well.  A band aid dressing was applied.  The patient was advised to apply ice later today and tomorrow to the injection sight as needed.    PLAN Call if any problems.  Precautions discussed.  Continue current medications.   Return to clinic 6 weeks   Electronically Signed Sanjuana Kava, MD 9/3/20209:32 AM

## 2018-10-27 ENCOUNTER — Other Ambulatory Visit: Payer: Self-pay | Admitting: Neurology

## 2018-11-13 ENCOUNTER — Telehealth: Payer: Self-pay | Admitting: *Deleted

## 2018-11-13 NOTE — Telephone Encounter (Signed)
Pt reports that she has a vaginal odor.

## 2018-11-13 NOTE — Telephone Encounter (Signed)
LMOVm returning patient's call.  

## 2018-11-14 ENCOUNTER — Encounter: Payer: Self-pay | Admitting: *Deleted

## 2018-11-15 NOTE — Telephone Encounter (Signed)
Erroneous encounter

## 2018-11-16 ENCOUNTER — Other Ambulatory Visit: Payer: Self-pay

## 2018-11-16 ENCOUNTER — Other Ambulatory Visit (INDEPENDENT_AMBULATORY_CARE_PROVIDER_SITE_OTHER): Payer: Medicaid Other | Admitting: *Deleted

## 2018-11-16 ENCOUNTER — Other Ambulatory Visit (HOSPITAL_COMMUNITY)
Admission: RE | Admit: 2018-11-16 | Discharge: 2018-11-16 | Disposition: A | Discharge status: Home / Self Care | Payer: Medicaid Other | Source: Ambulatory Visit | Attending: Obstetrics & Gynecology | Admitting: Obstetrics & Gynecology

## 2018-11-16 ENCOUNTER — Other Ambulatory Visit: Payer: Self-pay | Admitting: Women's Health

## 2018-11-16 DIAGNOSIS — N898 Other specified noninflammatory disorders of vagina: Secondary | ICD-10-CM

## 2018-11-16 NOTE — Progress Notes (Addendum)
   NURSE VISIT- VAGINITIS/STD/POC  SUBJECTIVE:  Brooke [name] is a 33 y.o. [id number] GYN patientfemale here for a vaginal swab for STD screen.  She reports the following symptoms: odor for several days. Denies abnormal vaginal bleeding, significant pelvic pain, fever, or UTI symptoms.  OBJECTIVE:  There were no vitals taken for this visit.  Appears well, in no apparent distress  ASSESSMENT: Vaginal swab for STD screen  PLAN: Self-collected vaginal probe for Gonorrhea, Chlamydia, Trichomonas, Bacterial Vaginosis, Yeast sent to lab Treatment: to be determined once results are received Follow-up as needed if symptoms persist/worsen, or new symptoms develop  Rash, Celene Squibb  11/16/2018 9:34 AM   Chart reviewed for nurse visit. Agree with plan of care.  Roma Schanz, [place] 12/03/2018 12:54 PM

## 2018-11-19 LAB — CERVICOVAGINAL ANCILLARY ONLY
Bacterial Vaginitis (gardnerella): POSITIVE — AB
Candida Glabrata: NEGATIVE
Candida Vaginitis: POSITIVE — AB
Molecular Disclaimer: NEGATIVE
Molecular Disclaimer: NEGATIVE
Molecular Disclaimer: NEGATIVE
Molecular Disclaimer: NORMAL
Trichomonas: NEGATIVE

## 2018-11-20 ENCOUNTER — Other Ambulatory Visit: Payer: Self-pay | Admitting: Women's Health

## 2018-11-20 LAB — CERVICOVAGINAL ANCILLARY ONLY
Chlamydia: NEGATIVE
Neisseria Gonorrhea: NEGATIVE

## 2018-11-20 MED ORDER — FLUCONAZOLE 150 MG PO TABS: ORAL_TABLET | ORAL | 2 refills | Status: DC

## 2018-11-20 MED ORDER — METRONIDAZOLE 500 MG PO TABS: 500.0000 mg | ORAL_TABLET | Freq: Two times a day (BID) | ORAL | 0 refills | Status: DC

## 2018-12-06 ENCOUNTER — Encounter: Payer: Self-pay | Admitting: Orthopaedic Surgery

## 2018-12-06 ENCOUNTER — Ambulatory Visit: Payer: Medicaid Other | Admitting: Orthopaedic Surgery

## 2018-12-06 ENCOUNTER — Other Ambulatory Visit: Payer: Self-pay

## 2018-12-06 VITALS — BP 124/59 | HR 71 | Ht 64.0 in | Wt 342.0 lb

## 2018-12-06 DIAGNOSIS — M25511 Pain in right shoulder: Secondary | ICD-10-CM | POA: Diagnosis not present

## 2018-12-06 DIAGNOSIS — G35 Multiple sclerosis: Secondary | ICD-10-CM | POA: Diagnosis not present

## 2018-12-06 DIAGNOSIS — M7062 Trochanteric bursitis, left hip: Secondary | ICD-10-CM

## 2018-12-06 DIAGNOSIS — Z6841 Body Mass Index (BMI) 40.0 and over, adult: Secondary | ICD-10-CM | POA: Diagnosis not present

## 2018-12-06 DIAGNOSIS — G8929 Other chronic pain: Secondary | ICD-10-CM

## 2018-12-06 NOTE — Progress Notes (Signed)
Patient Brooke [name], female DOB:Jun 28, 1985, 33 y.o. UTM:[id number]  Chief Complaint  Patient presents with  . Shoulder Pain    right  . Knee Pain    left     HPI  Charvi L Cappiello is a 33 y.o. female who has multiple sclerosis. She has had giving way of the left knee again. She had negative MRI.  I told her it was related to the MS.  She has left hip lateral pain, bursitis.  She has no redness.  She has pain with ambulation.  She has tried ice and heat.     Body mass index is 58.7 kg/m.  The patient meets the AMA guidelines for Morbid (severe) obesity with a BMI > 40.0 and I have recommended weight loss.   ROS  Review of Systems  Constitutional: Positive for activity change.  HENT: Negative for congestion.   Respiratory: Negative for cough and shortness of breath.   Cardiovascular: Negative for chest pain and leg swelling.  Endocrine: Positive for cold intolerance.  Musculoskeletal: Positive for arthralgias, back pain, gait problem and joint swelling.  Allergic/Immunologic: Positive for environmental allergies.  Psychiatric/Behavioral: The patient is nervous/anxious.   All other systems reviewed and are negative.   All other systems reviewed and are negative.  The following is a summary of the past history medically, past history surgically, known current medicines, social history and family history.  This information is gathered electronically by the computer from prior information and documentation.  I review this each visit and have found including this information at this point in the chart is beneficial and informative.    Past Medical History:  Diagnosis Date  . Anxiety   . Chronic back pain   . Depression   . Diabetes mellitus without complication (HCC)   . Edema   . MS (multiple sclerosis) (HCC) 09/29/2014    Past Surgical History:  Procedure Laterality Date  . APPENDECTOMY    . CHOLECYSTECTOMY N/A 10/07/2013   Procedure: LAPAROSCOPIC  CHOLECYSTECTOMY;  Surgeon: Dalia Heading, MD;  Location: [facility];  Service: General;  Laterality: N/A;  . LAPAROSCOPIC APPENDECTOMY N/A 05/12/2015   Procedure: APPENDECTOMY LAPAROSCOPIC;  Surgeon: Franky Macho, MD;  Location: [facility];  Service: General;  Laterality: N/A;  . WISDOM TOOTH EXTRACTION      Family History  Problem Relation Age of Onset  . Diabetes Mother   . Colon polyps Mother        hx of cancer  . Other Mother        DDD Lumber, cervical  . Sleep apnea Father     Social History Social History   Tobacco Use  . Smoking status: Former Smoker    Quit date: 09/16/2007    Years since quitting: 11.2  . Smokeless tobacco: Never Used  Substance Use Topics  . Alcohol use: No    Alcohol/week: 0.0 standard drinks  . Drug use: No    Types: Marijuana    Comment: no drug use since 2010    Allergies  Allergen Reactions  . Lexapro [Escitalopram Oxalate] Other (See Comments)    Suicidal thoughts, lowered libido  . Tysabri [Natalizumab] Hives    Hives, itching, elevated HR and BP.  [name] Latex Hives    Current Outpatient Medications  Medication Sig Dispense Refill  . Acetaminophen (TYLENOL PO) Take by mouth. As needed for knee pain.    [name] ALPRAZolam (XANAX) 0.5 MG tablet Take 0.5 mg by mouth daily as needed.  2  .  amitriptyline (ELAVIL) 25 MG tablet Take 25 mg by mouth at bedtime as needed. for sleep  2  . amphetamine-dextroamphetamine (ADDERALL XR) 30 MG 24 hr capsule Take 1 capsule (30 mg total) by mouth daily. 30 capsule 0  . calcium-vitamin D 250-100 MG-UNIT tablet Take 1 tablet by mouth 2 (two) times daily. 60 tablet 5  . cetirizine (ZYRTEC) 10 MG tablet Take 10 mg by mouth daily as needed.  5  . dalfampridine 10 MG TB12 One po bid 60 tablet 11  . ferrous sulfate 325 (65 FE) MG tablet Take 1 tablet (325 mg total) by mouth every morning. 90 tablet 3  . fluconazole (DIFLUCAN) 150 MG tablet 1 po stat; repeat in 3 days 2 tablet 2  . fluticasone (FLONASE) 50 MCG/ACT nasal  spray Place 1 spray into both nostrils daily as needed for allergies or rhinitis.    [name] gabapentin (NEURONTIN) 300 MG capsule TAKE 2 CAPSULES (600 MG TOTAL) BY MOUTH 3 (THREE) TIMES DAILY. 180 capsule 5  . HYDROcodone-acetaminophen (NORCO/VICODIN) 5-325 MG tablet Take 1 tablet by mouth every 4 (four) hours as needed. 15 tablet 0  . metroNIDAZOLE (FLAGYL) 500 MG tablet Take 1 tablet (500 mg total) by mouth 2 (two) times daily. 14 tablet 0  . Multiple Vitamin (MULTIVITAMIN) tablet Take 1 tablet by mouth daily.    . naproxen (NAPROSYN) 500 MG tablet Take 1 tablet (500 mg total) by mouth 2 (two) times daily with a meal. 60 tablet 5  . predniSONE (STERAPRED UNI-PAK 21 TAB) 5 MG (21) TBPK tablet Take 6 pills first day; 5 pills second day; 4 pills third day; 3 pills fourth day; 2 pills next day and 1 pill last day. 21 tablet 0  . TECFIDERA 240 MG CPDR TAKE 1 CAPSULE TWICE A DAY 60 capsule 11  . tiZANidine (ZANAFLEX) 4 MG tablet Take 4 mg by mouth 2 (two) times daily as needed.  2   No current facility-administered medications for this visit.      Physical Exam  Blood pressure (!) 124/59, pulse 71, height 5\' 4"  (1.626 m), weight (!) 342 lb (155.1 kg).  Constitutional: overall normal hygiene, normal nutrition, well developed, normal grooming, normal body habitus. Assistive device:none  Musculoskeletal: gait and station Limp left, muscle tone and strength are normal, no tremors or atrophy is present.  .  Neurological: coordination overall normal.  Deep tendon reflex/nerve stretch intact.  Sensation normal.  Cranial nerves II-XII intact.   Skin:   Normal overall no scars, lesions, ulcers or rashes. No psoriasis.  Psychiatric: Alert and oriented x 3.  Recent memory intact, remote memory unclear.  Normal mood and affect. Well groomed.  Good eye contact.  Cardiovascular: overall no swelling, no varicosities, no edema bilaterally, normal temperatures of the legs and arms, no clubbing, cyanosis and good  capillary refill.  Lymphatic: palpation is normal.  Both knees are tender, have some slight effusion with full ROM.    The left hip has tenderness over the trochanteric area.   ROM is full.  Limp to the left.  All other systems reviewed and are negative   The patient has been educated about the nature of the problem(s) and counseled on treatment options.  The patient appeared to understand what I have discussed and is in agreement with it.  Encounter Diagnoses  Name Primary?  . Trochanteric bursitis, left hip Yes  . Chronic pain in right shoulder   . MS (multiple sclerosis) (HCC)   . Body mass index  50.0-59.9, adult ([place])   . Morbid obesity ([place])    PROCEDURE NOTE:  The patient request injection, verbal consent was obtained.  The left trochanteric area of the hip was prepped appropriately after time out was performed.   Sterile technique was observed and injection of 1 cc of Depo-Medrol 40 mg with several cc's of plain xylocaine. Anesthesia was provided by ethyl chloride and a 20-gauge needle was used to inject the hip area. The injection was tolerated well.  A band aid dressing was applied.  The patient was advised to apply ice later today and tomorrow to the injection sight as needed.   PLAN Call if any problems.  Precautions discussed.  Continue current medications.   Return to clinic 6 weeks   Electronically Signed Sanjuana Kava, MD 10/15/202010:10 AM

## 2018-12-31 ENCOUNTER — Ambulatory Visit (INDEPENDENT_AMBULATORY_CARE_PROVIDER_SITE_OTHER): Payer: Medicaid Other | Admitting: *Deleted

## 2018-12-31 ENCOUNTER — Encounter: Payer: Self-pay | Admitting: *Deleted

## 2018-12-31 ENCOUNTER — Other Ambulatory Visit: Payer: Self-pay

## 2018-12-31 DIAGNOSIS — Z3201 Encounter for pregnancy test, result positive: Secondary | ICD-10-CM | POA: Diagnosis not present

## 2018-12-31 LAB — POCT URINE PREGNANCY: Preg Test, Ur: POSITIVE — AB

## 2018-12-31 NOTE — Progress Notes (Signed)
Chart reviewed for nurse visit. Agree with plan of care.  Estill Dooms, NP 12/31/2018 3:34 PM

## 2018-12-31 NOTE — Progress Notes (Signed)
   NURSE VISIT- PREGNANCY CONFIRMATION   SUBJECTIVE:  Brooke [name] is a 33 y.o. [id number] female at [redacted]w[redacted]d by certain LMP of Patient's last menstrual period was 11/05/2018. Here for pregnancy confirmation.  Home pregnancy test: positive x 1  She reports nausea.  She is not taking prenatal vitamins, but plans on starting Flinstone Complete 2 daily. Pt was advised not to take anymore Xanax or Adderall. Pt voiced understanding.   OBJECTIVE:  LMP 11/05/2018   Appears well, in no apparent distress OB History  Gravida Para Term Preterm AB Living  3       2 0  SAB TAB Ectopic Multiple Live Births  1   1        # Outcome Date GA Lbr Len/2nd Weight Sex Delivery Anes PTL Lv  3 Current           2 SAB 2013          1 Ectopic 2009            Results for orders placed or performed in visit on 12/31/18 (from the past 24 hour(s))  POCT urine pregnancy   Collection Time: 12/31/18  2:38 PM  Result Value Ref Range   Preg Test, Ur Positive (A) Negative    ASSESSMENT: Positive pregnancy test, [redacted]w[redacted]d by LMP    PLAN: Schedule for dating ultrasound in 1-2 weeks Prenatal vitamins: plans to begin OTC ASAP   Nausea medicines: not currently needed   OB packet given: Yes  Levy Pupa  12/31/2018 2:52 PM

## 2019-01-03 ENCOUNTER — Other Ambulatory Visit: Payer: Self-pay

## 2019-01-03 ENCOUNTER — Encounter: Payer: Self-pay | Admitting: Neurology

## 2019-01-03 ENCOUNTER — Ambulatory Visit: Payer: Medicaid Other | Admitting: Neurology

## 2019-01-03 VITALS — BP 128/72 | HR 94 | Temp 97.5°F | Ht 64.0 in | Wt 353.0 lb

## 2019-01-03 DIAGNOSIS — R5383 Other fatigue: Secondary | ICD-10-CM

## 2019-01-03 DIAGNOSIS — R29898 Other symptoms and signs involving the musculoskeletal system: Secondary | ICD-10-CM | POA: Diagnosis not present

## 2019-01-03 DIAGNOSIS — R269 Unspecified abnormalities of gait and mobility: Secondary | ICD-10-CM | POA: Diagnosis not present

## 2019-01-03 DIAGNOSIS — G35 Multiple sclerosis: Secondary | ICD-10-CM | POA: Diagnosis not present

## 2019-01-03 NOTE — Progress Notes (Signed)
[facility]  PATIENT: Brooke [name] DOB: 1985/05/20  REFERRING DOCTOR OR PCP:  Dr. Vickey Huger; PCP is Fleet Contras SOURCE: Patient, notes from Dr. Vickey Huger, imaging and laboratory reports, MRI images on PACS.  _________________________________   HISTORICAL  CHIEF COMPLAINT:  Chief Complaint  Patient presents with  . Follow-up    Rm 12 alone here for 4 month f/u   . Multiple Sclerosis    Pt reports she has stopped all meds including MS meds due to recent pregnacy~ she reports she is 8 weeks due date 08/12/19. Would like to discuss MS management during pregnancy.     HISTORY OF PRESENT ILLNESS:  Brooke [name] is a 33 year old woman with multiple sclerosis.   Despite having stable MRIs, she has noted some continued progression of her MS related symptoms.  Update 01/03/2019: She stopped Tecfidera when she found out about her pregnancy.  She is 2 months pregnant now.  She is sue 08/12/19.    She has flet stable with her MS and denies any new symptoms.   She is walking well and has no falls.   She feels the left leg is slightly weaker than her right leg and she has left hip pain as well.    Arms are strong bilaterally.   She has had bowel and bladder incontinence, worse with her pregnancy.    She has 2-3 x nocturia.    Mood is doing well and she denies depression.   She is sleeping well most nights but still has fatigue during the day.    She feels cognition is unchanged with mild short term meory issues.     Update 08/09/2018 (virtual) She feels her MS is mostly stable.   However, she has had some issues with her eyes jumping around.   Of notes 2018 MRI shows a posterior left pons focus.    She is on Tecfidera now and had been in the past as well.  In the interim, she tried [place] but Ocrevus seemed to not help and Tysabri caused a reaction.   She went back to [facility] 01/2017.   Her gait is off balanced and she has some stumbles and hits the walls at  times.  She notes that her left leg is weaker than the right leg and seems heavier.   Arms have good strength and coordination.    She notes fatigue but is sleeping well.    She has some anxiety and just a little depression.       Update 02/07/2018: She had an allergic reaction to her Tysabri infusion and felt that she progressed while on Ocrevus.  Therefore, she was to go back on Tecfidera.   She is tolerating it well and has no GI or flushing issues.   Her gait is poor due to poor balance.    She fell on Monday due to her left leg weakness and it giving out.    She has a lot of fatigue.   Phentermine did not help the energy much but she lost a couple pounds.    She sleeps well most nights.  She denies depression.  Update 11/07/2017: She tolerated her first Tysabri infusion well.  However, she had an allergic reaction with her second Tysabri infusion.  She broke out in hives and had some shortness of breath.   She received Benadryl and had improvement.  However, she has felt much more tired.  She also feels weaker and her left leg feels more  numb..      A higher dose of gabapentin 600 mg po tid is helping.    She was initially placed on Tecfidera but wanted to switch after a year and a half to Ocrevus and stayed on that for 2-3 cycles.     She felt she progressed on Ocrevus.     Of note, she was very concerned about the length of time between Ocrevus infusions even though the mechanism of action have been explained to her.  She was more comfortable with an every month infusion prompting the switch to Tysabri.  We had a long conversation today about the various treatment options.  She would be most comfortable with going back on Tecfidera and, she feels in retrospect, that she was stable while on it.  She would also be interested in trying Mayzent or Gilenya if she does not do well when she switches back to Tecfidera.  We also discussed today that the main purpose of a disease modifying therapy is to  prevent new relapses and slowing down progression.  Most of the time people do not feel significantly better on a disease modifying therapy but it alters the disease course.  She feels her fatigue has done worse.  She gets tired with stress short burst of effort.  She continues to note left greater than right leg weakness and numbness.  Her gait is off balance and she follows a couple times a week.  From 08/08/2017: She was diagnosed with MS in 2016 after presenting with visual disturbance, diplopia and poor gait.    She was initially placed on Tecfidera.   She did not note any benefit so wanted to switch medications.   She felt she is doing no better with the Ocrevus and wonders what medication would be best for her.     Currently, she has left > right leg weakness.   She denies spasticity.   Gait is off balanced and she falls every week or so.     She has numbness in the left leg.    Leg numbness is fairly constant and she gets occasional left hand numbness.     She has had urgency and some incontinence with urine x one year.    She has rare bowel incontinence.      She notes a lot of fatigue and even has to take a break to change her sheets or other chores.    She feels sleepy many days.  She gets 6 hours most nights. Amitriptyline helps her fall asleep.    She was once on phentermine and it may have helped her fatigue slightly.   However, insurance will not cover it.     She has been told that she snores some.   She has never been told that she has pauses or snorts/gasps.    She had a sleep study and did not have OSA.     However,  she has gained some weight.   She notes depression and anxiety.   She has had some panic attacks.     She feels her cognition is mildly impaired.   She has occasional difficulty with memory.      MRI's 05/13/2016 and 05/16/2016 were personally reviewed and I concur with the readings.:  IMPRESSION:  This MRI of the brain with and without contrast shows the following: 1.    Multiple T2/FLAIR hyperintense foci in the pons and hemispheres in a pattern and configuration consistent with chronic demyelinating plaque associated with  multiple sclerosis. None of the foci appears to be acute. When compared to the MRI dated 01/11/2015, there is no interval change. 2.    There is a normal enhancement pattern and there are no acute findings.   IMPRESSION:  This MRI of the cervical spine with and without contrast shows the following: 1.    Foci within the posterior spinal cord adjacent to C3 and the left spinal cord adjacent to C5.   Neither of these appear to be acute. 2.    Minimal disc degenerative changes at C5-C6 and C6-C7 that did not lead to any nerve root impingement. 3.    There is a normal enhancement pattern and there are no acute findings.  IMPRESSION:  This MRI of the thoracic spine with and without contrast shows the following: 1.   There is a focus within the left anterior spinal cord adjacent to T8-T9 consistent with a demyelinating plaque associated with multiple sclerosis. It appears to be chronic and does not enhance. There is a small focus to the right adjacent to T3-T4.   It is noted on the axial images but not the sagittal images and could represent a small chronic demyelinating plaque or be artifactual. 2.   There is small right lateral disc protrusion at T11-T12. There is no nerve root compression.. 3.    There is a normal enhancement pattern.   REVIEW OF SYSTEMS: Constitutional: No fevers, chills, sweats, or change in appetite.  She notes fatigue. Eyes: No visual changes, double vision, eye pain Ear, nose and throat: No hearing loss, ear pain, nasal congestion, sore throat Cardiovascular: No chest pain, palpitations Respiratory: No shortness of breath at rest or with exertion.   No wheezes GastrointestinaI: No nausea, vomiting, diarrhea, abdominal pain, fecal incontinence Genitourinary: No dysuria, urinary retention or frequency.  No nocturia.  Musculoskeletal: No neck pain, back pain Integumentary: No rash, pruritus, skin lesions Neurological: as above Psychiatric: She denies depression or anxiety.   Endocrine: No palpitations, diaphoresis, change in appetite, change in weigh or increased thirst Hematologic/Lymphatic: No anemia, purpura, petechiae. Allergic/Immunologic: No itchy/runny eyes, nasal congestion, recent allergic reactions, rashes  ALLERGIES: Allergies  Allergen Reactions  . Lexapro [Escitalopram Oxalate] Other (See Comments)    Suicidal thoughts, lowered libido  . Tysabri [Natalizumab] Hives    Hives, itching, elevated HR and BP.  [name]. Latex Hives    HOME MEDICATIONS:  Current Outpatient Medications:  .  Prenatal Vit-Fe Fumarate-FA (PRENATAL MULTIVITAMIN) TABS tablet, Take 1 tablet by mouth daily at 12 noon., Disp: , Rfl:  .  Acetaminophen (TYLENOL PO), Take by mouth. As needed for knee pain., Disp: , Rfl:  .  ALPRAZolam (XANAX) 0.5 MG tablet, Take 0.5 mg by mouth daily as needed., Disp: , Rfl: 2 .  amitriptyline (ELAVIL) 25 MG tablet, Take 25 mg by mouth at bedtime as needed. for sleep, Disp: , Rfl: 2 .  calcium-vitamin D 250-100 MG-UNIT tablet, Take 1 tablet by mouth 2 (two) times daily. (Patient not taking: Reported on 01/03/2019), Disp: 60 tablet, Rfl: 5 .  cetirizine (ZYRTEC) 10 MG tablet, Take 10 mg by mouth daily as needed., Disp: , Rfl: 5 .  dalfampridine 10 MG TB12, One po bid (Patient not taking: Reported on 01/03/2019), Disp: 60 tablet, Rfl: 11 .  ferrous sulfate 325 (65 FE) MG tablet, Take 1 tablet (325 mg total) by mouth every morning. (Patient not taking: Reported on 01/03/2019), Disp: 90 tablet, Rfl: 3 .  fluticasone (FLONASE) 50 MCG/ACT nasal spray, Place 1  spray into both nostrils daily as needed for allergies or rhinitis., Disp: , Rfl:  .  gabapentin (NEURONTIN) 300 MG capsule, TAKE 2 CAPSULES (600 MG TOTAL) BY MOUTH 3 (THREE) TIMES DAILY. (Patient not taking: Reported on 01/03/2019), Disp: 180  capsule, Rfl: 5 .  Multiple Vitamin (MULTIVITAMIN) tablet, Take 1 tablet by mouth daily., Disp: , Rfl:  .  naproxen (NAPROSYN) 500 MG tablet, Take 1 tablet (500 mg total) by mouth 2 (two) times daily with a meal. (Patient not taking: Reported on 01/03/2019), Disp: 60 tablet, Rfl: 5 .  TECFIDERA 240 MG CPDR, TAKE 1 CAPSULE TWICE A DAY (Patient not taking: Reported on 01/03/2019), Disp: 60 capsule, Rfl: 11  PAST MEDICAL HISTORY: Past Medical History:  Diagnosis Date  . Anxiety   . Chronic back pain   . Depression   . Diabetes mellitus without complication (HCC)   . Edema   . MS (multiple sclerosis) (HCC) 09/29/2014    PAST SURGICAL HISTORY: Past Surgical History:  Procedure Laterality Date  . APPENDECTOMY    . CHOLECYSTECTOMY N/A 10/07/2013   Procedure: LAPAROSCOPIC CHOLECYSTECTOMY;  Surgeon: Dalia Heading, MD;  Location: [facility];  Service: General;  Laterality: N/A;  . LAPAROSCOPIC APPENDECTOMY N/A 05/12/2015   Procedure: APPENDECTOMY LAPAROSCOPIC;  Surgeon: Franky Macho, MD;  Location: [facility];  Service: General;  Laterality: N/A;  . WISDOM TOOTH EXTRACTION      FAMILY HISTORY: Family History  Problem Relation Age of Onset  . Diabetes Mother   . Colon polyps Mother        hx of cancer  . Other Mother        DDD [place]umber, cervical  . Sleep apnea Father     SOCIAL HISTORY:  Social History   Socioeconomic History  . Marital status: Single    Spouse name: Not on file  . Number of children: Not on file  . Years of education: 2 y colleg  . Highest education level: Not on file  Occupational History  . Occupation: work  Engineer, production  . Financial resource strain: Not on file  . Food insecurity    Worry: Not on file    Inability: Not on file  . Transportation needs    Medical: Not on file    Non-medical: Not on file  Tobacco Use  . Smoking status: Former Smoker    Quit date: 09/16/2007    Years since quitting: 11.3  . Smokeless tobacco: Never Used  Substance and Sexual  Activity  . Alcohol use: No    Alcohol/week: 0.0 standard drinks  . Drug use: No    Types: Marijuana    Comment: no drug use since 2010  . Sexual activity: Yes    Birth control/protection: None  Lifestyle  . Physical activity    Days per week: Not on file    Minutes per session: Not on file  . Stress: Not on file  Relationships  . Social Musician on phone: Not on file    Gets together: Not on file    Attends religious service: Not on file    Active member of club or organization: Not on file    Attends meetings of clubs or organizations: Not on file    Relationship status: Not on file  . Intimate partner violence    Fear of current or ex partner: Not on file    Emotionally abused: Not on file    Physically abused: Not on file  Forced sexual activity: Not on file  Other Topics Concern  . Not on file  Social History Narrative   Drinks 1-2 large glasses of caffeine daily.        PHYSICAL EXAM  Vitals:   01/03/19 1510  BP: 128/72  Pulse: 94  Temp: (!) 97.5 F (36.4 C)  TempSrc: Temporal  SpO2: 96%  Weight: (!) 353 lb (160.1 kg)  Height: 5\' 4"  (1.626 m)    Body mass index is 60.59 kg/m.   General: The patient is well-developed and well-nourished and in no acute distress   Skin: Extremities are without rash or edema.   Neurologic Exam  Mental status: The patient is alert and oriented x 3 at the time of the examination. The patient has apparent normal recent and remote memory, with an apparently normal attention span and concentration ability.   Speech is normal.  Cranial nerves: Extraocular movements are full.  Facial strength and sensation is normal.  Trapezius strength is normal.. No obvious hearing deficits are noted.  Motor:  Muscle bulk is normal.   Tone is mildly increased in the left leg.  Strength was 4+/5 in the left leg.  Sensory: She has normal and symmetric sensation to touch in arms/legs.  Coordination: Cerebellar testing reveals  good finger-nose-finger and heel-to-shin bilaterally.  Gait and station: Station is normal.  The gait is slightly wide and she has a mild foot drop on the left.  Tandem gait is wide.  Romberg is negative..   Reflexes: Deep tendon reflexes are symmetric and normal in the arms and increased at the knees.[name]     DIAGNOSTIC DATA (LABS, IMAGING, TESTING) - I reviewed patient records, labs, notes, testing and imaging myself where available.  Lab Results  Component Value Date   WBC 5.6 02/25/2018   HGB 11.9 (L) 02/25/2018   HCT 37.1 02/25/2018   MCV 88.3 02/25/2018   PLT 271 02/25/2018      Component Value Date/Time   NA 136 02/25/2018 1650   NA 138 04/24/2017 1028   K 3.8 02/25/2018 1650   CL 102 02/25/2018 1650   CO2 30 02/25/2018 1650   GLUCOSE 98 02/25/2018 1650   BUN 9 02/25/2018 1650   BUN 12 04/24/2017 1028   CREATININE 0.84 02/25/2018 1650   CALCIUM 8.5 (L) 02/25/2018 1650   PROT 7.7 07/28/2017 0119   PROT 7.2 04/24/2017 1028   ALBUMIN 3.5 07/28/2017 0119   ALBUMIN 3.8 04/24/2017 1028   AST 17 07/28/2017 0119   ALT 17 07/28/2017 0119   ALKPHOS 64 07/28/2017 0119   BILITOT 0.6 07/28/2017 0119   BILITOT 0.3 04/24/2017 1028   GFRNONAA >60 02/25/2018 1650   GFRAA >60 02/25/2018 1650   ____________________________________________  Assessment and plan:  MS (multiple sclerosis) (HCC)  Left leg weakness  Gait disturbance  Other fatigue  1.   She is currently 2 months pregnant.  She stopped the Tecfidera as soon as she found out that she was pregnant.  We discussed staying off of a disease modifying therapy during her pregnancy.  She is planning on breast-feeding for short period of time and I would want to get her back on therapy as soon as possible after that. 2.   We will make an appointment for 8 months which should be about 1 month after she delivers.  At that time we can make a plan to restart medication.  She should call sooner if there are new or worsening  neurologic symptoms.   Samauri Kellenberger  Aida PufferA. Lorenzo Arscott, MD, PhD, FAAN Certified in Neurology, Clinical Neurophysiology, Sleep Medicine, Pain Medicine and Neuroimaging Director, [facility]  [facility] [address], [address] [place], [place] [address] [phone]

## 2019-01-04 ENCOUNTER — Other Ambulatory Visit: Payer: Self-pay | Admitting: Obstetrics & Gynecology

## 2019-01-04 DIAGNOSIS — O3680X0 Pregnancy with inconclusive fetal viability, not applicable or unspecified: Secondary | ICD-10-CM

## 2019-01-07 ENCOUNTER — Other Ambulatory Visit: Payer: Self-pay | Admitting: Obstetrics & Gynecology

## 2019-01-07 ENCOUNTER — Ambulatory Visit (INDEPENDENT_AMBULATORY_CARE_PROVIDER_SITE_OTHER): Payer: Medicaid Other

## 2019-01-07 ENCOUNTER — Other Ambulatory Visit: Payer: Self-pay

## 2019-01-07 ENCOUNTER — Other Ambulatory Visit: Payer: Medicaid Other

## 2019-01-07 DIAGNOSIS — Z3A09 9 weeks gestation of pregnancy: Secondary | ICD-10-CM

## 2019-01-07 DIAGNOSIS — O3680X0 Pregnancy with inconclusive fetal viability, not applicable or unspecified: Secondary | ICD-10-CM

## 2019-01-07 NOTE — Progress Notes (Signed)
[place] TA/TV:homogeneous anteverted uterus,no IUP visualized,EEC 20.5 mm,normal left ovary,simple right ovarian cyst 4.4 x 3.1 x 3.7 cm,no free fluid,no pain during ultrasound,discussed results w/Jennifer,labs ordered

## 2019-01-08 ENCOUNTER — Other Ambulatory Visit: Payer: Self-pay | Admitting: Adult Health

## 2019-01-08 DIAGNOSIS — Z3201 Encounter for pregnancy test, result positive: Secondary | ICD-10-CM

## 2019-01-08 LAB — BETA HCG QUANT (REF LAB): hCG Quant: 688 m[IU]/mL

## 2019-01-08 NOTE — Progress Notes (Signed)
Ck QHCG in 2 days

## 2019-01-10 ENCOUNTER — Other Ambulatory Visit: Payer: Self-pay

## 2019-01-10 ENCOUNTER — Other Ambulatory Visit: Payer: Medicaid Other

## 2019-01-11 LAB — BETA HCG QUANT (REF LAB): hCG Quant: 948 m[IU]/mL

## 2019-01-14 ENCOUNTER — Other Ambulatory Visit: Payer: Self-pay | Admitting: Adult Health

## 2019-01-14 DIAGNOSIS — Z3201 Encounter for pregnancy test, result positive: Secondary | ICD-10-CM

## 2019-01-14 NOTE — Progress Notes (Signed)
Ck QHCG this week  

## 2019-01-15 ENCOUNTER — Telehealth: Payer: Self-pay | Admitting: Adult Health

## 2019-01-15 LAB — BETA HCG QUANT (REF LAB): hCG Quant: 1516 m[IU]/mL

## 2019-01-15 NOTE — Telephone Encounter (Signed)
Pt aware QHCG is rising, but not doubling, she says she is bleeding and ?passed tissue 01/11/19. Will check stat QHCG at 8:30 in am and will get CBC,CMP and ABO RH and then see Dr Glo Herring at 3 pm tomorrow

## 2019-01-16 ENCOUNTER — Ambulatory Visit (INDEPENDENT_AMBULATORY_CARE_PROVIDER_SITE_OTHER): Payer: Medicaid Other | Admitting: Obstetrics and Gynecology

## 2019-01-16 ENCOUNTER — Other Ambulatory Visit: Payer: Medicaid Other

## 2019-01-16 ENCOUNTER — Encounter: Payer: Self-pay | Admitting: Obstetrics and Gynecology

## 2019-01-16 ENCOUNTER — Other Ambulatory Visit: Payer: Self-pay

## 2019-01-16 VITALS — BP 119/58 | HR 91 | Ht 64.0 in | Wt 350.8 lb

## 2019-01-16 DIAGNOSIS — Z3201 Encounter for pregnancy test, result positive: Secondary | ICD-10-CM

## 2019-01-16 DIAGNOSIS — O3680X0 Pregnancy with inconclusive fetal viability, not applicable or unspecified: Secondary | ICD-10-CM | POA: Diagnosis not present

## 2019-01-16 NOTE — Addendum Note (Signed)
Addended by: Octaviano Glow on: 01/16/2019 09:12 AM   Modules accepted: Orders

## 2019-01-16 NOTE — Progress Notes (Signed)
Patient ID: Brooke [name], female   DOB: Apr 29, 1985, 33 y.o.   MRN: [id number]    [facility] Clinic Visit  @DATE @            Patient name: Brooke [name] MRN Brooke [name]  Date of birth: 10/20/1985  CC & HPI:  Brooke [name] is a 33 y.o. female presenting today for follow up of pregnancy in unknown location. Labs not completed at this time. Hasn't been seen by midwives for initial obstetrical intake. She is bleeding but not as much as she normally has during a period. She is having no pain. She has passed small clot already on 01/11/2019 that she believes has tissue in it. She has shown pictures today in office of that "clot"and it appears to have tissue in it.  ROS:  ROS Current eval considered a +pregnancy in unknown location, miscarriage considered more likely  Pertinent History Reviewed:   Reviewed: Medical         Past Medical History:  Diagnosis Date  . Anxiety   . Chronic back pain   . Depression   . Diabetes mellitus without complication (HCC)   . Edema   . MS (multiple sclerosis) (HCC) 09/29/2014                              Surgical Hx:    Past Surgical History:  Procedure Laterality Date  . APPENDECTOMY    . CHOLECYSTECTOMY N/A 10/07/2013   Procedure: LAPAROSCOPIC CHOLECYSTECTOMY;  Surgeon: 10/09/2013, MD;  Location: [facility];  Service: General;  Laterality: N/A;  . LAPAROSCOPIC APPENDECTOMY N/A 05/12/2015   Procedure: APPENDECTOMY LAPAROSCOPIC;  Surgeon: 05/14/2015, MD;  Location: [facility];  Service: General;  Laterality: N/A;  . WISDOM TOOTH EXTRACTION     Medications: Reviewed & Updated - see associated section                       Current Outpatient Medications:  .  Acetaminophen (TYLENOL PO), Take by mouth. As needed for knee pain., Disp: , Rfl:  .  Prenatal Vit-Fe Fumarate-FA (PRENATAL MULTIVITAMIN) TABS tablet, Take 1 tablet by mouth daily at 12 noon., Disp: , Rfl:  .  ALPRAZolam (XANAX) 0.5 MG tablet, Take 0.5 mg by mouth daily as needed., Disp:  , Rfl: 2 .  amitriptyline (ELAVIL) 25 MG tablet, Take 25 mg by mouth at bedtime as needed. for sleep, Disp: , Rfl: 2 .  calcium-vitamin D 250-100 MG-UNIT tablet, Take 1 tablet by mouth 2 (two) times daily. (Patient not taking: Reported on 01/03/2019), Disp: 60 tablet, Rfl: 5 .  cetirizine (ZYRTEC) 10 MG tablet, Take 10 mg by mouth daily as needed., Disp: , Rfl: 5 .  dalfampridine 10 MG TB12, One po bid (Patient not taking: Reported on 01/03/2019), Disp: 60 tablet, Rfl: 11 .  ferrous sulfate 325 (65 FE) MG tablet, Take 1 tablet (325 mg total) by mouth every morning. (Patient not taking: Reported on 01/03/2019), Disp: 90 tablet, Rfl: 3 .  fluticasone (FLONASE) 50 MCG/ACT nasal spray, Place 1 spray into both nostrils daily as needed for allergies or rhinitis., Disp: , Rfl:  .  gabapentin (NEURONTIN) 300 MG capsule, TAKE 2 CAPSULES (600 MG TOTAL) BY MOUTH 3 (THREE) TIMES DAILY. (Patient not taking: Reported on 01/03/2019), Disp: 180 capsule, Rfl: 5 .  Multiple Vitamin (MULTIVITAMIN) tablet, Take 1 tablet by mouth daily., Disp: , Rfl:  .  naproxen (NAPROSYN) 500 MG tablet, Take 1 tablet (500 mg total) by mouth 2 (two) times daily with a meal. (Patient not taking: Reported on 01/03/2019), Disp: 60 tablet, Rfl: 5 .  TECFIDERA 240 MG CPDR, TAKE 1 CAPSULE TWICE A DAY (Patient not taking: Reported on 01/03/2019), Disp: 60 capsule, Rfl: 11   Social History: Reviewed -  reports that she quit smoking about 11 years ago. She has never used smokeless tobacco.  Objective Findings:  Vitals: Blood pressure (!) 119/58, pulse 91, height 5\' 4"  (1.626 m), weight (!) 350 lb 12.8 oz (159.1 kg), last menstrual period 11/05/2018.  PHYSICAL EXAMINATION General appearance - alert, well appearing, and in no distress and overweight Mental status - normal mood, behavior, speech, dress, motor activity, and thought processes, affect appropriate to mood Photos reviewed of clot and probable tissue passed earlier on  11.20. PELVIC Discussion only  Assessment & Plan:   A:  1. Pregnancy in undetermined location 2 blood type A POS. P:  1.   Await serial q hcg. 2. If pt has lateralizing pain or suspects complications , to be [facility].    By signing my name below, I, Samul Dada, attest that this documentation has been prepared under the direction and in the presence of Jonnie Kind, MD. Electronically Signed: [place]. 01/16/19. 3:19 PM.  I personally performed the services described in this documentation, which was SCRIBED in my presence. The recorded information has been reviewed and considered accurate. It has been edited as necessary during review. Jonnie Kind, MD

## 2019-01-17 LAB — BETA HCG QUANT (REF LAB): hCG Quant: 1831 m[IU]/mL

## 2019-01-17 IMAGING — DX DG KNEE COMPLETE 4+V*L*
4 series · 4 of 4 positions shown · non-contrast
Comparison: 10/05/2007

CLINICAL DATA: Medial knee pain, swelling

EXAM:
LEFT KNEE - COMPLETE 4+ VIEW

[knee ap (1 of 3)]
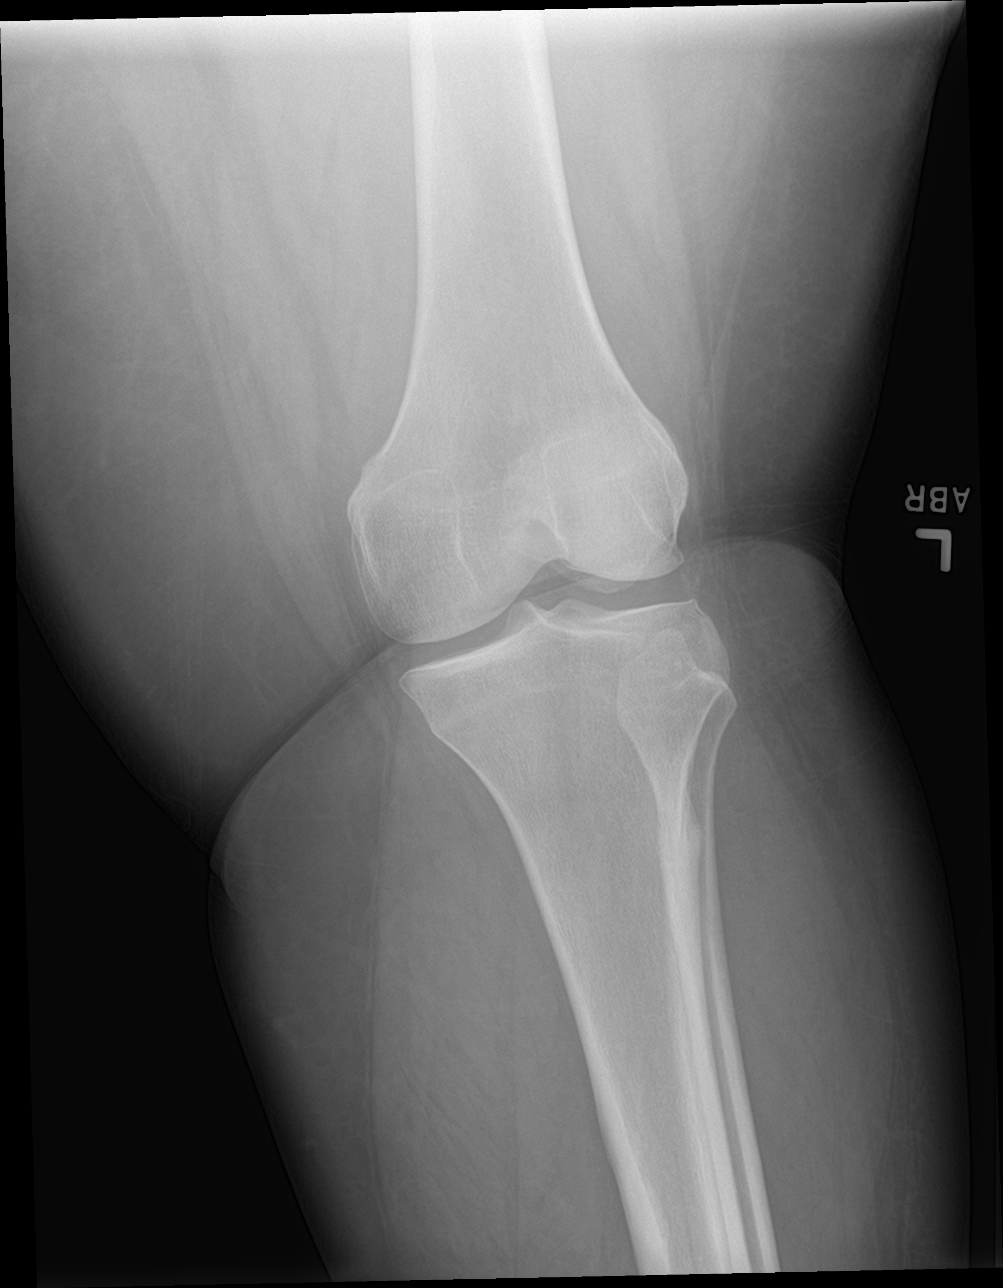

[knee ap (2 of 3)]
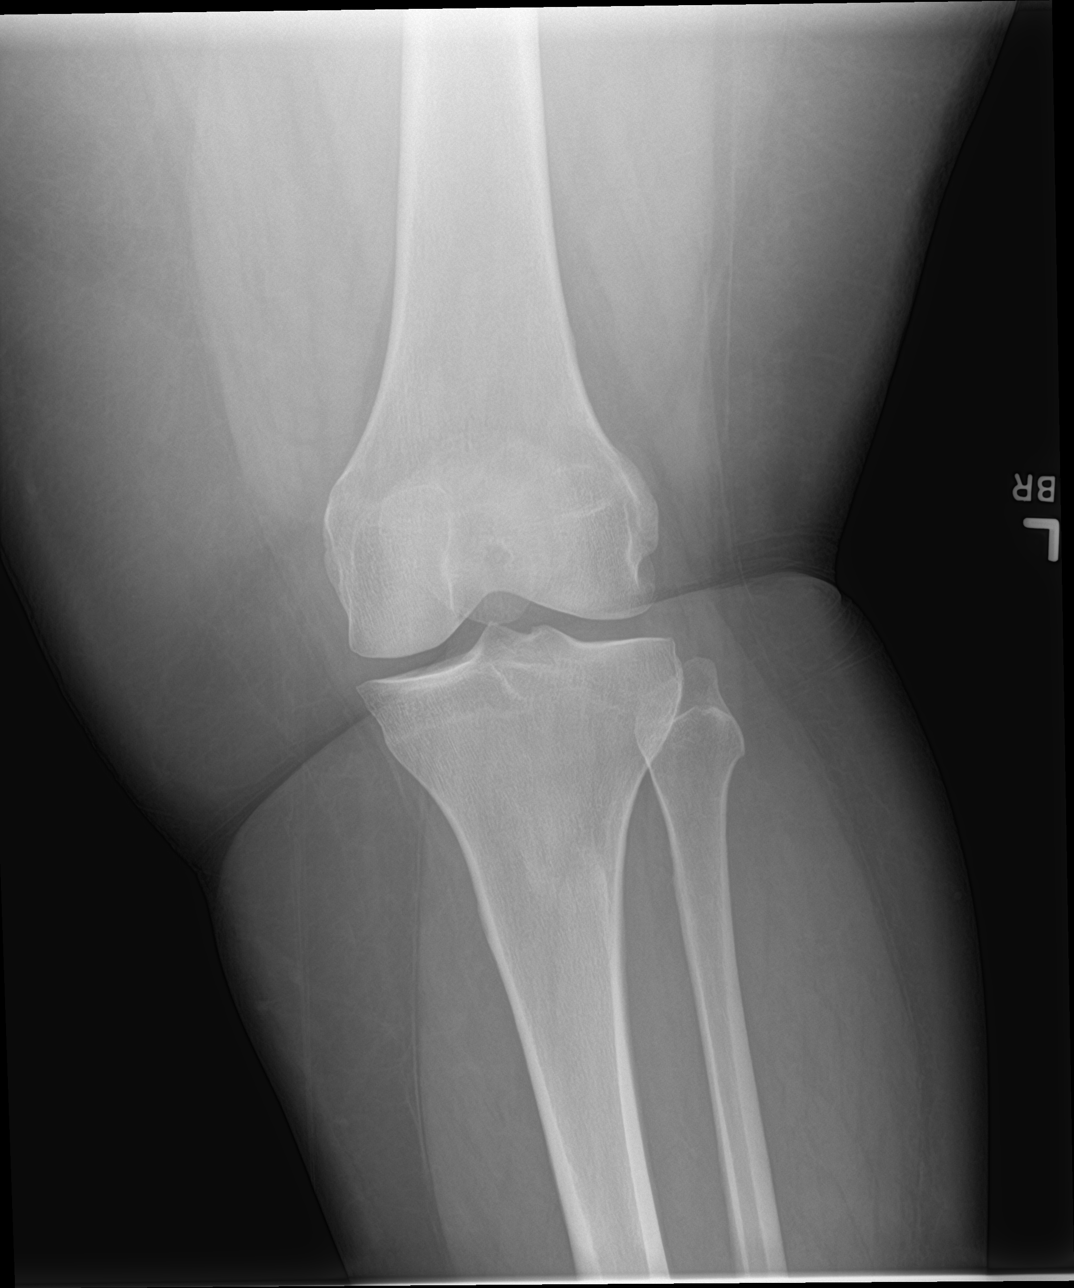

[knee ap (3 of 3)]
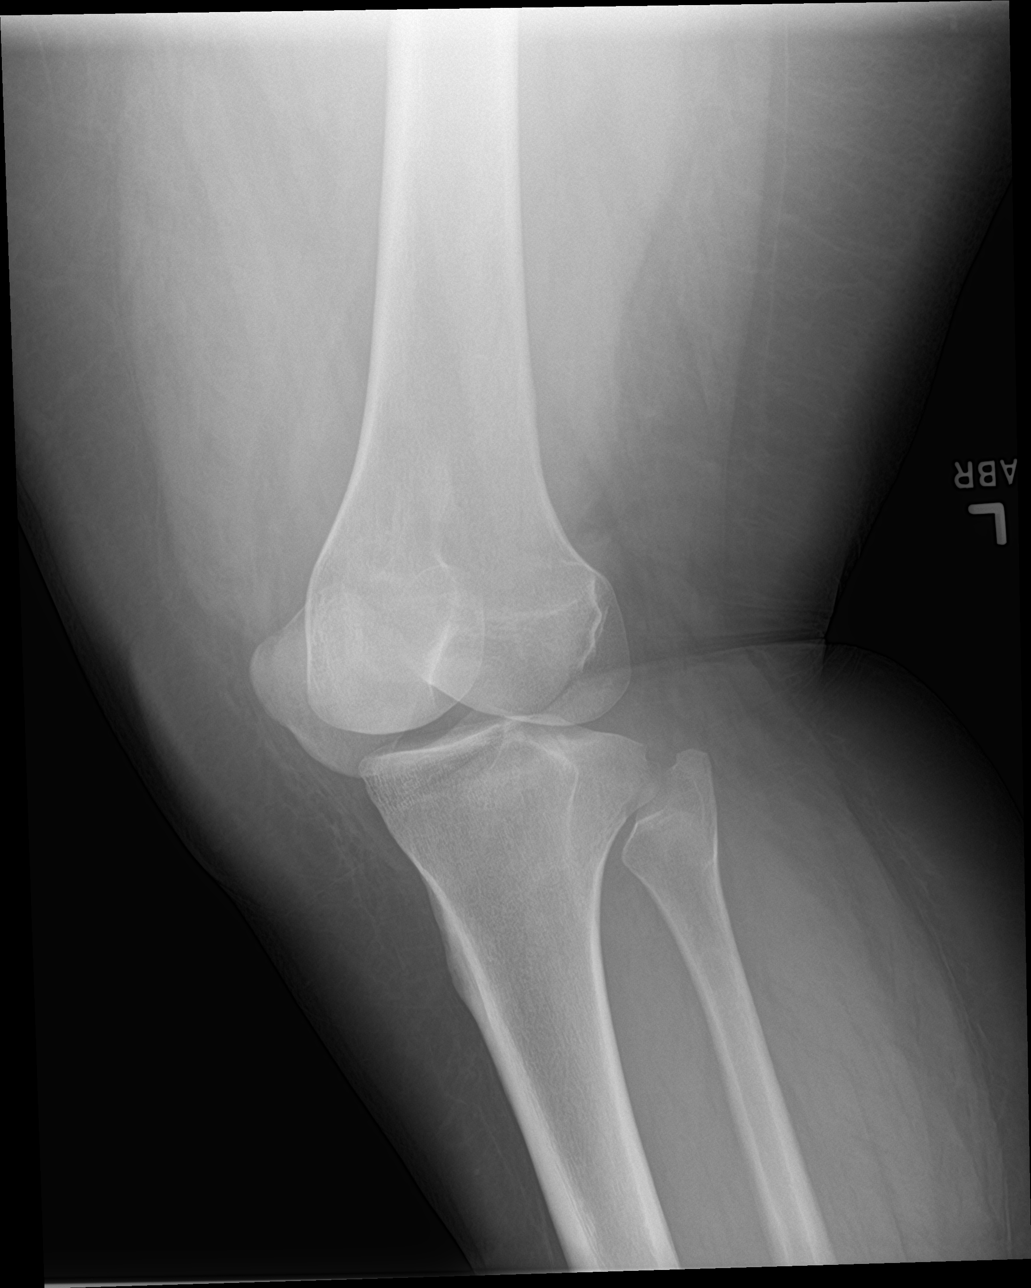

[knee lat]
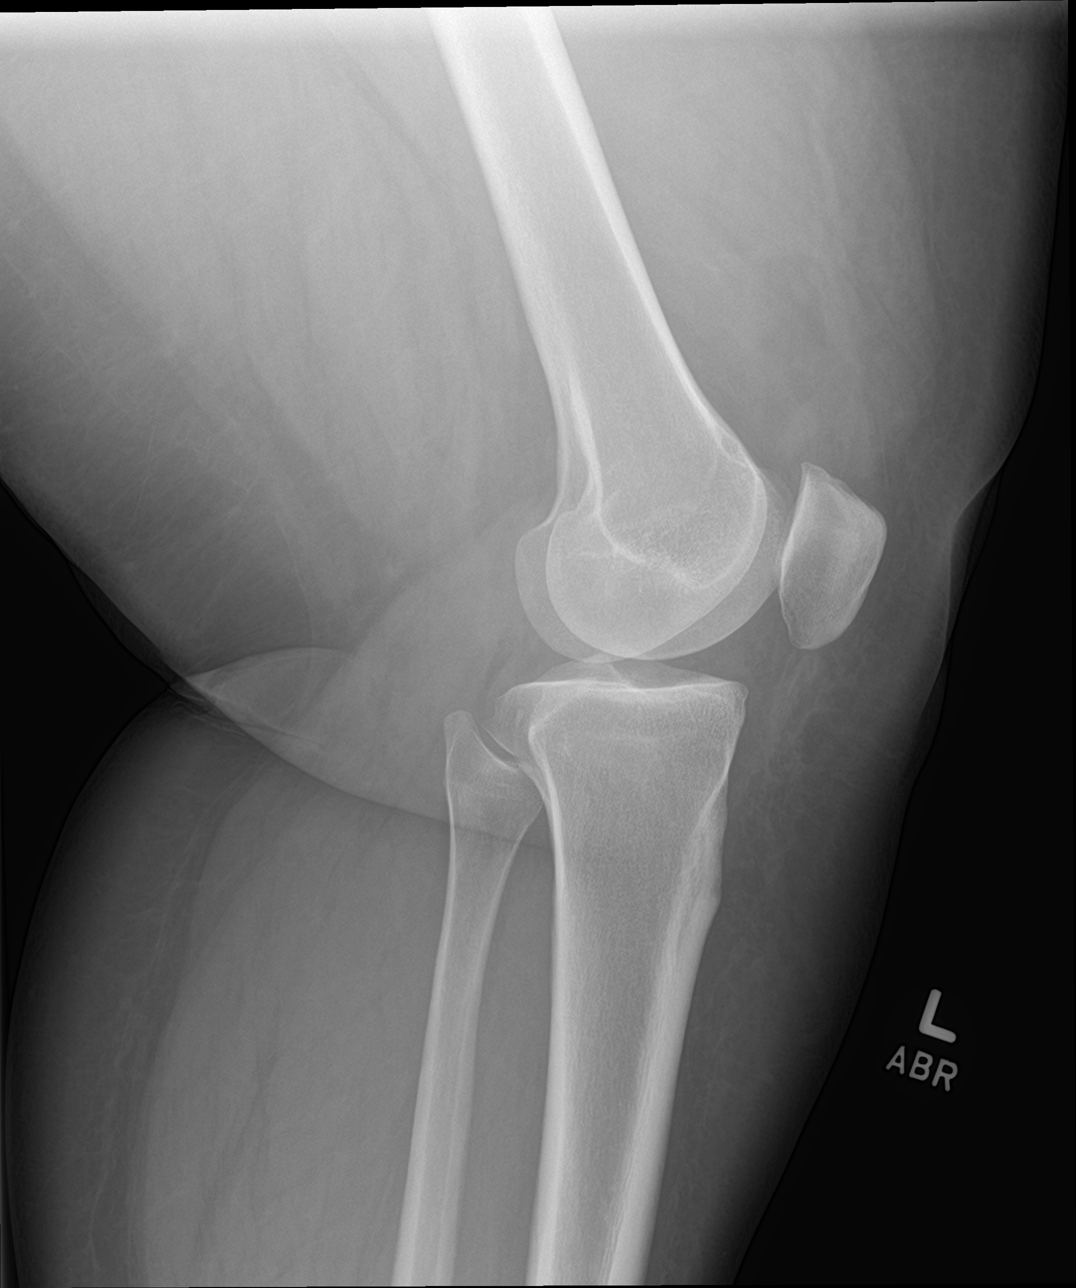

[4 of 4 positions shown; findings below may reference images not displayed]

FINDINGS: No evidence of fracture, dislocation, or joint effusion. No evidence
of arthropathy or other focal bone abnormality. Soft tissues are
unremarkable.
IMPRESSION: Negative.

## 2019-01-24 ENCOUNTER — Encounter: Payer: Self-pay | Admitting: Orthopedic Surgery

## 2019-01-24 ENCOUNTER — Encounter: Payer: Self-pay | Admitting: Orthopaedic Surgery

## 2019-01-24 ENCOUNTER — Ambulatory Visit: Payer: Medicaid Other | Admitting: Orthopaedic Surgery

## 2019-01-28 ENCOUNTER — Ambulatory Visit: Payer: Medicaid Other | Admitting: *Deleted

## 2019-01-28 ENCOUNTER — Other Ambulatory Visit: Payer: Self-pay

## 2019-01-28 ENCOUNTER — Encounter: Payer: Medicaid Other | Admitting: Women's Health

## 2019-01-28 ENCOUNTER — Other Ambulatory Visit: Payer: Medicaid Other

## 2019-01-28 ENCOUNTER — Encounter: Payer: Self-pay | Admitting: *Deleted

## 2019-01-28 DIAGNOSIS — O3680X0 Pregnancy with inconclusive fetal viability, not applicable or unspecified: Secondary | ICD-10-CM

## 2019-01-29 ENCOUNTER — Other Ambulatory Visit: Payer: Self-pay | Admitting: Adult Health

## 2019-01-29 ENCOUNTER — Telehealth: Payer: Self-pay | Admitting: Adult Health

## 2019-01-29 ENCOUNTER — Other Ambulatory Visit: Payer: Medicaid Other

## 2019-01-29 DIAGNOSIS — O3680X0 Pregnancy with inconclusive fetal viability, not applicable or unspecified: Secondary | ICD-10-CM

## 2019-01-29 LAB — ABO/RH: Rh Factor: POSITIVE

## 2019-01-29 LAB — COMPREHENSIVE METABOLIC PANEL
ALT: 13 IU/L (ref 0–32)
AST: 12 IU/L (ref 0–40)
Albumin/Globulin Ratio: 1 — ABNORMAL LOW (ref 1.2–2.2)
Albumin: 3.5 g/dL — ABNORMAL LOW (ref 3.8–4.8)
Alkaline Phosphatase: 64 IU/L (ref 39–117)
BUN/Creatinine Ratio: 9 (ref 9–23)
BUN: 6 mg/dL (ref 6–20)
Bilirubin Total: 0.2 mg/dL (ref 0.0–1.2)
CO2: 24 mmol/L (ref 20–29)
Calcium: 9 mg/dL (ref 8.7–10.2)
Chloride: 101 mmol/L (ref 96–106)
Creatinine, Ser: 0.68 mg/dL (ref 0.57–1.00)
GFR calc Af Amer: 135 mL/min/{1.73_m2} (ref >59)
GFR calc non Af Amer: 117 mL/min/{1.73_m2} (ref >59)
Globulin, Total: 3.5 g/dL (ref 1.5–4.5)
Glucose: 103 mg/dL — ABNORMAL HIGH (ref 65–99)
Potassium: 4 mmol/L (ref 3.5–5.2)
Sodium: 139 mmol/L (ref 134–144)
Total Protein: 7 g/dL (ref 6.0–8.5)

## 2019-01-29 LAB — CBC
Hematocrit: 33 % — ABNORMAL LOW (ref 34.0–46.6)
Hemoglobin: 11.6 g/dL (ref 11.1–15.9)
MCH: 29.7 pg (ref 26.6–33.0)
MCHC: 35.2 g/dL (ref 31.5–35.7)
MCV: 85 fL (ref 79–97)
Platelets: 303 10*3/uL (ref 150–450)
RBC: 3.9 x10E6/uL (ref 3.77–5.28)
RDW: 12.7 % (ref 11.7–15.4)
WBC: 6.9 10*3/uL (ref 3.4–10.8)

## 2019-01-29 LAB — BETA HCG QUANT (REF LAB): hCG Quant: 1601 m[IU]/mL

## 2019-01-29 NOTE — Telephone Encounter (Signed)
Left message to come this afternoon at 4 pm for 4:45 [place], QHCG not dropping as it should

## 2019-01-30 ENCOUNTER — Other Ambulatory Visit: Payer: Self-pay

## 2019-01-30 ENCOUNTER — Ambulatory Visit (INDEPENDENT_AMBULATORY_CARE_PROVIDER_SITE_OTHER): Payer: Medicaid Other | Admitting: Advanced Practice Midwife

## 2019-01-30 ENCOUNTER — Encounter: Payer: Self-pay | Admitting: Advanced Practice Midwife

## 2019-01-30 VITALS — BP 126/77 | HR 86 | Ht 64.0 in | Wt 351.8 lb

## 2019-01-30 DIAGNOSIS — O209 Hemorrhage in early pregnancy, unspecified: Secondary | ICD-10-CM | POA: Diagnosis not present

## 2019-01-30 DIAGNOSIS — Z3A12 12 weeks gestation of pregnancy: Secondary | ICD-10-CM

## 2019-01-30 NOTE — Progress Notes (Signed)
   GYN VISIT Patient name: Brooke [name] MRN [id number]  Date of birth: [birth year] Chief Complaint:   Follow-up (quant not dropping; vaginal bleeding started yesterday; ? ectopic)  History of Present Illness:   Brooke [name] is a 33 y.o. G19P0020 African American female being seen today for f/u of inappropriate fall in quant BhCG level. U/S was scheduled for yesterday afternoon but pt did not receive the message. She started vag bldg yesterday; denies pain. 01/28/19: 1601 01/16/19: 1831     Patient's last menstrual period was 11/05/2018. The current method of family planning is none.  Last pap 04/2018. Results were:  normal Review of Systems:   Pertinent items are noted in HPI Denies fever/chills, dizziness, headaches, visual disturbances, fatigue, shortness of breath, chest pain, abdominal pain, vomiting, abnormal vaginal discharge/itching/odor/irritation, problems with periods, bowel movements, urination, or intercourse unless otherwise stated above.  Pertinent History Reviewed:  Reviewed past medical,surgical, social, obstetrical and family history.  Reviewed problem list, medications and allergies. Physical Assessment:   Vitals:   01/30/19 1116  BP: 126/77  Pulse: 86  Weight: (!) 351 lb 12.8 oz (159.6 kg)  Height: 5\' 4"  (1.626 m)  Body mass index is 60.39 kg/m.       Physical Examination:   General appearance: alert, well appearing, and in no distress  Mental status: alert, oriented to person, place, and time  Skin: warm & dry   Cardiovascular: normal heart rate noted  Respiratory: normal respiratory effort, no distress  Abdomen: soft, non-tender   Pelvic: normal external genitalia, vulva, vagina, cervix, uterus and adnexa, examination not indicated  Extremities: no edema   Chaperone: n/a    No results found for this or any previous visit (from the past 24 hour(s)).  Assessment & Plan:  1) Inappropriate fall in quant BhCG> needs u/s and potentially additional labs    Meds: No orders of the defined types were placed in this encounter.   No orders of the defined types were placed in this encounter.   Return for keep u/s tomorrow at noon.  Myrtis Ser CNM 01/30/2019 11:46 AM

## 2019-01-30 NOTE — Patient Instructions (Signed)
Vaginal Bleeding During Pregnancy, First Trimester ° °A small amount of bleeding (spotting) from the vagina is common during early pregnancy. Sometimes the bleeding is normal and does not cause problems. At other times, though, bleeding may be a sign of something serious. Tell your doctor about any bleeding from your vagina right away. °Follow these instructions at home: °Activity °· Follow your doctor's instructions about how active you can be. °· If needed, make plans for someone to help with your normal activities. °· Do not have sex or orgasms until your doctor says that this is safe. °General instructions °· Take over-the-counter and prescription medicines only as told by your doctor. °· Watch your condition for any changes. °· Write down: °? The number of pads you use each day. °? How often you change pads. °? How soaked (saturated) your pads are. °· Do not use tampons. °· Do not douche. °· If you pass any tissue from your vagina, save it to show to your doctor. °· Keep all follow-up visits as told by your doctor. This is important. °Contact a doctor if: °· You have vaginal bleeding at any time while you are pregnant. °· You have cramps. °· You have a fever. °Get help right away if: °· You have very bad cramps in your back or belly (abdomen). °· You pass large clots or a lot of tissue from your vagina. °· Your bleeding gets worse. °· You feel light-headed. °· You feel weak. °· You pass out (faint). °· You have chills. °· You are leaking fluid from your vagina. °· You have a gush of fluid from your vagina. °Summary °· Sometimes vaginal bleeding during pregnancy is normal and does not cause problems. At other times, bleeding may be a sign of something serious. °· Tell your doctor about any bleeding from your vagina right away. °· Follow your doctor's instructions about how active you can be. You may need someone to help you with your normal activities. °This information is not intended to replace advice given to  you by your health care provider. Make sure you discuss any questions you have with your health care provider. °Document Released: 06/24/2013 Document Revised: 05/29/2018 Document Reviewed: 05/11/2016 °Elsevier Patient Education © 2020 [employer] Inc[employer] ° °

## 2019-01-31 ENCOUNTER — Other Ambulatory Visit: Payer: Self-pay

## 2019-01-31 ENCOUNTER — Encounter: Payer: Self-pay | Admitting: Adult Health

## 2019-01-31 ENCOUNTER — Ambulatory Visit (INDEPENDENT_AMBULATORY_CARE_PROVIDER_SITE_OTHER): Payer: Medicaid Other

## 2019-01-31 ENCOUNTER — Inpatient Hospital Stay (HOSPITAL_COMMUNITY)
Admission: AD | Admit: 2019-01-31 | Discharge: 2019-01-31 | Disposition: A | Discharge status: Home / Self Care | Payer: Medicaid Other | Attending: Obstetrics and Gynecology | Admitting: Obstetrics and Gynecology

## 2019-01-31 ENCOUNTER — Ambulatory Visit (INDEPENDENT_AMBULATORY_CARE_PROVIDER_SITE_OTHER): Payer: Medicaid Other | Admitting: Adult Health

## 2019-01-31 ENCOUNTER — Other Ambulatory Visit: Payer: Self-pay | Admitting: Adult Health

## 2019-01-31 VITALS — BP 128/81 | HR 83 | Ht 64.0 in | Wt 351.0 lb

## 2019-01-31 DIAGNOSIS — O3680X0 Pregnancy with inconclusive fetal viability, not applicable or unspecified: Secondary | ICD-10-CM | POA: Diagnosis not present

## 2019-01-31 DIAGNOSIS — O009 Unspecified ectopic pregnancy without intrauterine pregnancy: Secondary | ICD-10-CM | POA: Insufficient documentation

## 2019-01-31 DIAGNOSIS — Z3A13 13 weeks gestation of pregnancy: Secondary | ICD-10-CM | POA: Diagnosis not present

## 2019-01-31 DIAGNOSIS — O00201 Right ovarian pregnancy without intrauterine pregnancy: Secondary | ICD-10-CM | POA: Diagnosis not present

## 2019-01-31 DIAGNOSIS — O00101 Right tubal pregnancy without intrauterine pregnancy: Secondary | ICD-10-CM | POA: Insufficient documentation

## 2019-01-31 DIAGNOSIS — Z3A00 Weeks of gestation of pregnancy not specified: Secondary | ICD-10-CM | POA: Diagnosis not present

## 2019-01-31 DIAGNOSIS — Z3A12 12 weeks gestation of pregnancy: Secondary | ICD-10-CM

## 2019-01-31 MED ORDER — METHOTREXATE FOR ECTOPIC PREGNANCY
50.0000 mg/m2 | Freq: Once | INTRAMUSCULAR | Status: AC
  Administered 2019-01-31: 135 mg via INTRAMUSCULAR
  Filled 2019-01-31: qty 2

## 2019-01-31 NOTE — Progress Notes (Signed)
[place] TA/TV: no IUP visualized,EEC 3.1 mm,normal right ovary,echogenic right adnexal mass with a ring of color flow 2 x 1.6 x 1.6,normal left ovary,small posterior intramural fibroid 5 x 5 x 4 mm,discussed results w/Jennifer    Chaperone Tammy Sabeen Piechocki J Finnick Orosz 01/31/2019, 2:10 PM no free fluid,no pain during ultrasound

## 2019-01-31 NOTE — Discharge Instructions (Signed)
Methotrexate Treatment for an Ectopic Pregnancy, Care After °This sheet gives you information about how to care for yourself after your procedure. Your health care provider may also give you more specific instructions. If you have problems or questions, contact your health care provider. °What can I expect after the procedure? °After the procedure, it is common to have: °· Abdominal cramping. °· Vaginal bleeding. °· Fatigue. °· Nausea. °· Vomiting. °· Diarrhea. °Blood tests will be taken at timed intervals for several days or weeks to check your pregnancy hormone levels. The blood tests will be done until the pregnancy hormone can no longer be detected in the blood. °Follow these instructions at home: °Activity °· Do not have sex until your health care provider approves. °· Limit activities that take a lot of effort as told by your health care provider. °Medicines °· Take over the counter and prescription medicines only as told by your health care provider. °· Do not take aspirin, ibuprofen, naproxen, or any other NSAIDs. °· Do not take folic acid, prenatal vitamins, or other vitamins that contain folic acid. °General instructions ° °· Do not drink alcohol. °· Follow instructions from your health care provider on how and when to report any symptoms that may indicate a ruptured ectopic pregnancy. °· Keep all follow-up visits as told by your health care provider. This is important. °Contact a health care provider if: °· You have persistent nausea and vomiting. °· You have persistent diarrhea. °· You are having a reaction to the medicine, such as: °? Tiredness. °? Skin rash. °? Hair loss. °Get help right away if: °· Your abdominal or pelvic pain gets worse. °· You have more vaginal bleeding. °· You feel light-headed or you faint. °· You have shortness of breath. °· Your heart rate increases. °· You develop a cough. °· You have chills. °· You have a fever. °Summary °· After the procedure, it is common to have symptoms  of abdominal cramping, vaginal bleeding and fatigue. You may also experience other symptoms. °· Blood tests will be taken at timed intervals for several days or weeks to check your pregnancy hormone levels. The blood tests will be done until the pregnancy hormone can no longer be detected in the blood. °· Limit strenuous activity as told by your health care provider. °· Follow instructions from your health care provider on how and when to report any symptoms that may indicate a ruptured ectopic pregnancy. °This information is not intended to replace advice given to you by your health care provider. Make sure you discuss any questions you have with your health care provider. °Document Released: 01/27/2011 Document Revised: 01/20/2017 Document Reviewed: 03/29/2016 °Elsevier Patient Education © 2020 Elsevier Inc. ° °

## 2019-01-31 NOTE — Progress Notes (Addendum)
  Subjective:     Patient ID: Brooke [name], female   DOB: [birth year], 33 y.o.   MRN: [id number]  HPI Brooke [name] is a 33 year old black female in for [place] to rule out ectopic, her [facility] is not dropping as it should. She has a history of ectopic in the past.She had bleeding and clot passed with ?tissue on 01/11/19. PCP is [facility].   Review of Systems Has had bleeding  Denies any pain   Reviewed past medical,surgical, social and family history. Reviewed medications and allergies.     Objective:   Physical Exam BP 128/81 (BP Location: Left Arm, Patient Position: Sitting, Cuff Size: Normal)   Pulse 83   Ht 5\' 4"  (1.626 m)   Wt (!) 351 lb (159.2 kg)   LMP 11/05/2018   BMI 60.25 kg/m   Skin warm and dry.  Lungs: clear to ausculation bilaterally. Cardiovascular: regular rate and rhythm. Abdomen is soft and non tender PT aware that US showed right adnexal mass 2 x 1.6 x 1.6 cm mass ovaries appear normal, no IUP seen. She had CBC and CMP 01/28/2019, blood type is A+ and QHCG was 1601.     Assessment:   1. Right tubal pregnancy without intrauterine pregnancy Go to MAU now, for MTX treatment, Manya Silvas CNM aware     Plan:     Follow up in 4 days here for [facility] and ROS

## 2019-01-31 NOTE — MAU Note (Signed)
.  Brooke [name] is a 33 y.o.  here in MAU reporting: that she was sent for an injection. Pt denies any pain , small amount of vaginal bleeding LMP:  Onset of complaint: ongoing Pain score: 0 There were no vitals filed for this visit.   FHT: Lab orders placed from triage:

## 2019-01-31 NOTE — MAU Provider Note (Signed)
History    First Provider Initiated Contact with Patient 01/31/19 1805       Chief Complaint:  methotrexate injection   Brooke [name] is  33 y.o. G3P0020 Patient's last menstrual period was 11/05/2018.[name] Patient is here for methotrexate treatment for right ectopic pregnancy diagnosed at [facility] today.       ROS Abdominal Pain: Denies Vaginal bleeding: spotting.   Passage of clots or tissue: Denies Dizziness: Denies  A positive  Her previous Quantitative HCG values are:  Results for Brooke, [name] (MRN [id number]) as of 01/31/2019 18:07  Ref. Range 01/07/2019 12:16 01/10/2019 11:19 01/14/2019 15:42 01/16/2019 09:11 01/28/2019 16:46  hCG Quant Latest Units: mIU/mL 688 948 1,516 1,831 1,601   Physical Exam   Patient Vitals for the past 24 hrs:  BP Temp Pulse Resp SpO2  01/31/19 1640 125/66 98.2 F (36.8 C) 81 16 99 %   Constitutional: Well-nourished female in no apparent distress. No pallor Neuro: Alert and oriented 4 Cardiovascular: Normal rate Respiratory: Normal effort and rate Abdomen: Deferred Gynecological Exam: examination not indicated  Labs: No results found for this or any previous visit (from the past 24 hour(s)).  Ultrasound Studies:   [place] [facility] showed right adnexal mass 2 x 1.6 x 1.6 cm mass ovaries appear normal, no IUP seen.  MAU course/MDM: 2 cm right ectopic pregnancy.  Patient is stable and is a good candidate for methotrexate therapy.  Dose of methotrexate given in MAU.  Patient instructed to stop all folic acid-containing medications and NSAIDs.  Explained that although methotrexate is a very effective therapy it does not completely eliminate the possibility of ruptured ectopic pregnancy.  Patient instructed to return to maternity admissions if abdominal pain worsens.  Reiterated importance of follow-up hCG levels to determine if methotrexate therapy has been effective.  Assessment: Right ectopic  pregnancy  Plan: Discharge home in stable condition. Ectopic precautions Follow-up Information    [facility] Follow up on 02/04/2019.   Why: As scheduled for blood work or sooner as needed if symptoms worsen Contact information: [address] [place] [address] [phone]       [facility] Follow up.   Specialty: Obstetrics and Gynecology Why: As needed if symptoms worsen Contact information: [address] [phone] Brooke [name] [address] [phone]         Allergies as of 01/31/2019      Reactions   Lexapro [escitalopram Oxalate] Other (See Comments)   Suicidal thoughts, lowered libido   Tysabri [natalizumab] Hives   Hives, itching, elevated HR and BP.   Latex Hives      Medication List    STOP taking these medications   multivitamin tablet   prenatal multivitamin Tabs tablet     TAKE these medications   ALPRAZolam 0.5 MG tablet Commonly known as: XANAX Take 0.5 mg by mouth daily as needed.   amitriptyline 25 MG tablet Commonly known as: ELAVIL Take 25 mg by mouth at bedtime as needed. for sleep   calcium-vitamin D 250-100 MG-UNIT tablet Take 1 tablet by mouth 2 (two) times daily.   cetirizine 10 MG tablet Commonly known as: ZYRTEC Take 10 mg by mouth daily as needed.   dalfampridine 10 MG Tb12 One po bid   ferrous sulfate 325 (65 FE) MG tablet Take 1 tablet (325 mg total) by mouth every morning.   fluticasone 50 MCG/ACT nasal spray Commonly known as: FLONASE Place 1 spray  into both nostrils daily as needed for allergies or rhinitis.   gabapentin 300 MG capsule Commonly known as: NEURONTIN TAKE 2 CAPSULES (600 MG TOTAL) BY MOUTH 3 (THREE) TIMES DAILY.   naproxen 500 MG tablet Commonly known as: NAPROSYN Take 1 tablet (500 mg total) by mouth 2 (two) times daily with a meal.   Tecfidera 240 MG Cpdr Generic drug: Dimethyl Fumarate TAKE 1 CAPSULE TWICE A DAY    TYLENOL PO Take by mouth. As needed for knee pain.       Brooke [name], [place], [place] 01/31/2019, 6:05 PM  2/3

## 2019-02-04 ENCOUNTER — Other Ambulatory Visit: Payer: Self-pay

## 2019-02-04 ENCOUNTER — Telehealth: Payer: Self-pay | Admitting: Adult Health

## 2019-02-04 ENCOUNTER — Ambulatory Visit: Payer: Medicaid Other | Admitting: Adult Health

## 2019-02-04 ENCOUNTER — Other Ambulatory Visit: Payer: Medicaid Other

## 2019-02-04 DIAGNOSIS — O00101 Right tubal pregnancy without intrauterine pregnancy: Secondary | ICD-10-CM

## 2019-02-04 NOTE — Telephone Encounter (Signed)
Per Anderson Malta, please put in lab orders for a quant for this patient, the lab actually already drew her labs this morning, but they need the order.

## 2019-02-05 ENCOUNTER — Other Ambulatory Visit: Payer: Self-pay | Admitting: Adult Health

## 2019-02-05 ENCOUNTER — Telehealth: Payer: Self-pay | Admitting: Adult Health

## 2019-02-05 DIAGNOSIS — O00101 Right tubal pregnancy without intrauterine pregnancy: Secondary | ICD-10-CM

## 2019-02-05 LAB — BETA HCG QUANT (REF LAB): hCG Quant: 715 m[IU]/mL

## 2019-02-05 NOTE — Progress Notes (Signed)
Check [facility] 12/17

## 2019-02-05 NOTE — Telephone Encounter (Signed)
Pt aware that [facility] dropping and needs to recheck Thursday, order in in, also sent on my chart

## 2019-02-08 ENCOUNTER — Ambulatory Visit: Payer: Medicaid Other | Admitting: Adult Health

## 2019-02-08 LAB — BETA HCG QUANT (REF LAB): hCG Quant: 437 m[IU]/mL

## 2019-02-11 ENCOUNTER — Other Ambulatory Visit: Payer: Self-pay | Admitting: Adult Health

## 2019-02-11 DIAGNOSIS — O00101 Right tubal pregnancy without intrauterine pregnancy: Secondary | ICD-10-CM

## 2019-02-11 DIAGNOSIS — O00201 Right ovarian pregnancy without intrauterine pregnancy: Secondary | ICD-10-CM

## 2019-02-11 NOTE — Progress Notes (Signed)
Ck QHCG  

## 2019-02-13 LAB — BETA HCG QUANT (REF LAB): hCG Quant: 185 m[IU]/mL

## 2019-02-19 ENCOUNTER — Other Ambulatory Visit: Payer: Self-pay | Admitting: Adult Health

## 2019-02-19 DIAGNOSIS — O00101 Right tubal pregnancy without intrauterine pregnancy: Secondary | ICD-10-CM

## 2019-02-19 NOTE — Progress Notes (Signed)
Ck QHCG this week  

## 2019-02-26 ENCOUNTER — Other Ambulatory Visit: Payer: Self-pay | Admitting: Adult Health

## 2019-02-26 DIAGNOSIS — O00201 Right ovarian pregnancy without intrauterine pregnancy: Secondary | ICD-10-CM

## 2019-02-26 LAB — BETA HCG QUANT (REF LAB): hCG Quant: 27 m[IU]/mL

## 2019-02-26 NOTE — Progress Notes (Signed)
Ck QHCG in 1 week 

## 2019-04-22 ENCOUNTER — Telehealth: Payer: Self-pay | Admitting: Adult Health

## 2019-04-22 DIAGNOSIS — G35 Multiple sclerosis: Secondary | ICD-10-CM

## 2019-04-22 MED ORDER — TECFIDERA 120 & 240 MG PO MISC: ORAL | 0 refills | Status: DC

## 2019-04-22 MED ORDER — TECFIDERA 240 MG PO CPDR: DELAYED_RELEASE_CAPSULE | ORAL | 11 refills | Status: DC

## 2019-04-22 NOTE — Addendum Note (Signed)
Addended by: Arther Abbott on: 04/22/2019 05:11 PM   Modules accepted: Orders

## 2019-04-22 NOTE — Telephone Encounter (Signed)
I am answering voicemails today.  She left a voicemail with questions about her MS med

## 2019-04-22 NOTE — Telephone Encounter (Signed)
Called, LVM returning pt call 

## 2019-04-22 NOTE — Telephone Encounter (Signed)
Pt called office back, I took call from phone staff. Pt reported that she lost her baby. She would like to get started back on Tecfidera. I placed her on hold and spoke with MD. Received VO to call in starter/maintenance dose of Tecfidera to [facility] pharmacy for her. I relayed this to the patient. She is aware medication may require PA.  We will work on this if needed. I also scheduled her a f/u for 07/03/19 at 3:30pm with Dr. Epimenio Foot

## 2019-07-03 ENCOUNTER — Other Ambulatory Visit: Payer: Self-pay

## 2019-07-03 ENCOUNTER — Encounter: Payer: Self-pay | Admitting: Neurology

## 2019-07-03 ENCOUNTER — Ambulatory Visit (INDEPENDENT_AMBULATORY_CARE_PROVIDER_SITE_OTHER): Payer: Medicaid Other | Admitting: Neurology

## 2019-07-03 VITALS — BP 145/76 | HR 78 | Temp 97.1°F | Ht 64.0 in | Wt 351.5 lb

## 2019-07-03 DIAGNOSIS — R269 Unspecified abnormalities of gait and mobility: Secondary | ICD-10-CM

## 2019-07-03 DIAGNOSIS — R5383 Other fatigue: Secondary | ICD-10-CM

## 2019-07-03 DIAGNOSIS — G35 Multiple sclerosis: Secondary | ICD-10-CM | POA: Diagnosis not present

## 2019-07-03 DIAGNOSIS — Z79899 Other long term (current) drug therapy: Secondary | ICD-10-CM | POA: Diagnosis not present

## 2019-07-03 DIAGNOSIS — R29898 Other symptoms and signs involving the musculoskeletal system: Secondary | ICD-10-CM | POA: Diagnosis not present

## 2019-07-03 MED ORDER — DALFAMPRIDINE ER 10 MG PO TB12: ORAL_TABLET | ORAL | 11 refills | Status: DC

## 2019-07-03 MED ORDER — OXYBUTYNIN CHLORIDE 5 MG PO TABS: 5.0000 mg | ORAL_TABLET | Freq: Two times a day (BID) | ORAL | 11 refills | Status: DC

## 2019-07-03 NOTE — Progress Notes (Signed)
[facility]  PATIENT: Brooke [name] DOB: 09-22-1985  REFERRING DOCTOR OR PCP:  Dr. Vickey Huger; PCP is Fleet Contras SOURCE: Patient, notes from Dr. Vickey Huger, imaging and laboratory reports, MRI images on PACS.  _________________________________   HISTORICAL  CHIEF COMPLAINT:  Chief Complaint  Patient presents with  . Follow-up    RM 13. Last seen 01/03/19.  . Multiple Sclerosis    not on DMT. Stopped d/t previous pregnancy but she lost the baby. Restart tecfidera?     HISTORY OF PRESENT ILLNESS:  Brooke [name] is a 34 year old woman with multiple sclerosis.      Update 07/03/2019: She had a miscarriage 02/09/2020.   She went back on Tecfidera few weeks later.   She is tolerating it well.     Gait is doing about the same with reduced balance.   She has a few stumbles but no falls.  She feels weaker in her left leg than right.   She denies numbness or dysesthesias.   She has urinary urge incontinence.  She has occasional bowel incontinence.    She ha ssome fatigue.  She is sleeping well.   She takes amitriptyline at bedime.   She denies depression or much anxiety.   Update 01/03/2019: She stopped Tecfidera when she found out about her pregnancy.  She is 2 months pregnant now.  She is sue 08/12/19.    She has flet stable with her MS and denies any new symptoms.   She is walking well and has no falls.   She feels the left leg is slightly weaker than her right leg and she has left hip pain as well.    Arms are strong bilaterally.   She has had bowel and bladder incontinence, worse with her pregnancy.    She has 2-3 x nocturia.    Mood is doing well and she denies depression.   She is sleeping well most nights but still has fatigue during the day.    She feels cognition is unchanged with mild short term meory issues.     Update 08/09/2018 (virtual) She feels her MS is mostly stable.   However, she has had some issues with her eyes jumping around.   Of notes  2018 MRI shows a posterior left pons focus.    She is on Tecfidera now and had been in the past as well.  In the interim, she tried [place] but Ocrevus seemed to not help and Tysabri caused a reaction.   She went back to [facility] 01/2017.   Her gait is off balanced and she has some stumbles and hits the walls at times.  She notes that her left leg is weaker than the right leg and seems heavier.   Arms have good strength and coordination.    She notes fatigue but is sleeping well.    She has some anxiety and just a little depression.       Update 02/07/2018: She had an allergic reaction to her Tysabri infusion and felt that she progressed while on Ocrevus.  Therefore, she was to go back on Tecfidera.   She is tolerating it well and has no GI or flushing issues.   Her gait is poor due to poor balance.    She fell on Monday due to her left leg weakness and it giving out.    She has a lot of fatigue.   Phentermine did not help the energy much but she lost a couple pounds.  She sleeps well most nights.  She denies depression.  Update 11/07/2017: She tolerated her first Tysabri infusion well.  However, she had an allergic reaction with her second Tysabri infusion.  She broke out in hives and had some shortness of breath.   She received Benadryl and had improvement.  However, she has felt much more tired.  She also feels weaker and her left leg feels more numb..      A higher dose of gabapentin 600 mg po tid is helping.    She was initially placed on Tecfidera but wanted to switch after a year and a half to [place] and stayed on that for 2-3 cycles.     She felt she progressed on Ocrevus.     Of note, she was very concerned about the length of time between Ocrevus infusions even though the mechanism of action have been explained to her.  She was more comfortable with an every month infusion prompting the switch to Tysabri.  We had a long conversation today about the various treatment options.   She would be most comfortable with going back on Tecfidera and, she feels in retrospect, that she was stable while on it.  She would also be interested in trying Mayzent or Gilenya if she does not do well when she switches back to Tecfidera.  We also discussed today that the main purpose of a disease modifying therapy is to prevent new relapses and slowing down progression.  Most of the time people do not feel significantly better on a disease modifying therapy but it alters the disease course.  She feels her fatigue has done worse.  She gets tired with stress short burst of effort.  She continues to note left greater than right leg weakness and numbness.  Her gait is off balance and she follows a couple times a week.  From 08/08/2017: She was diagnosed with MS in 2016 after presenting with visual disturbance, diplopia and poor gait.    She was initially placed on Tecfidera.   She did not note any benefit so wanted to switch medications.   She felt she is doing no better with the Ocrevus and wonders what medication would be best for her.     Currently, she has left > right leg weakness.   She denies spasticity.   Gait is off balanced and she falls every week or so.     She has numbness in the left leg.    Leg numbness is fairly constant and she gets occasional left hand numbness.     She has had urgency and some incontinence with urine x one year.    She has rare bowel incontinence.      She notes a lot of fatigue and even has to take a break to change her sheets or other chores.    She feels sleepy many days.  She gets 6 hours most nights. Amitriptyline helps her fall asleep.    She was once on phentermine and it may have helped her fatigue slightly.   However, insurance will not cover it.     She has been told that she snores some.   She has never been told that she has pauses or snorts/gasps.    She had a sleep study and did not have OSA.     However,  she has gained some weight.   She notes depression  and anxiety.   She has had some panic attacks.     She feels  her cognition is mildly impaired.   She has occasional difficulty with memory.      MRI's 05/13/2016 and 05/16/2016 were personally reviewed and I concur with the readings.:  IMPRESSION:  This MRI of the brain with and without contrast shows the following: 1.   Multiple T2/FLAIR hyperintense foci in the pons and hemispheres in a pattern and configuration consistent with chronic demyelinating plaque associated with multiple sclerosis. None of the foci appears to be acute. When compared to the MRI dated 01/11/2015, there is no interval change. 2.    There is a normal enhancement pattern and there are no acute findings.   IMPRESSION:  This MRI of the cervical spine with and without contrast shows the following: 1.    Foci within the posterior spinal cord adjacent to C3 and the left spinal cord adjacent to C5.   Neither of these appear to be acute. 2.    Minimal disc degenerative changes at C5-C6 and C6-C7 that did not lead to any nerve root impingement. 3.    There is a normal enhancement pattern and there are no acute findings.  IMPRESSION:  This MRI of the thoracic spine with and without contrast shows the following: 1.   There is a focus within the left anterior spinal cord adjacent to T8-T9 consistent with a demyelinating plaque associated with multiple sclerosis. It appears to be chronic and does not enhance. There is a small focus to the right adjacent to T3-T4.   It is noted on the axial images but not the sagittal images and could represent a small chronic demyelinating plaque or be artifactual. 2.   There is small right lateral disc protrusion at T11-T12. There is no nerve root compression.. 3.    There is a normal enhancement pattern.   REVIEW OF SYSTEMS: Constitutional: No fevers, chills, sweats, or change in appetite.  She notes fatigue. Eyes: No visual changes, double vision, eye pain Ear, nose and throat: No hearing loss,  ear pain, nasal congestion, sore throat Cardiovascular: No chest pain, palpitations Respiratory: No shortness of breath at rest or with exertion.   No wheezes GastrointestinaI: No nausea, vomiting, diarrhea, abdominal pain, fecal incontinence Genitourinary: No dysuria, urinary retention or frequency.  No nocturia. Musculoskeletal: No neck pain, back pain Integumentary: No rash, pruritus, skin lesions Neurological: as above Psychiatric: She denies depression or anxiety.   Endocrine: No palpitations, diaphoresis, change in appetite, change in weigh or increased thirst Hematologic/Lymphatic: No anemia, purpura, petechiae. Allergic/Immunologic: No itchy/runny eyes, nasal congestion, recent allergic reactions, rashes  ALLERGIES: Allergies  Allergen Reactions  . Lexapro [Escitalopram Oxalate] Other (See Comments)    Suicidal thoughts, lowered libido  . Tysabri [Natalizumab] Hives    Hives, itching, elevated HR and BP.  [name] Latex Hives    HOME MEDICATIONS:  Current Outpatient Medications:  .  Acetaminophen (TYLENOL PO), Take by mouth. As needed for knee pain., Disp: , Rfl:  .  ALPRAZolam (XANAX) 0.5 MG tablet, Take 0.5 mg by mouth daily as needed., Disp: , Rfl: 2 .  amitriptyline (ELAVIL) 25 MG tablet, Take 25 mg by mouth at bedtime as needed. for sleep, Disp: , Rfl: 2 .  calcium-vitamin D 250-100 MG-UNIT tablet, Take 1 tablet by mouth 2 (two) times daily., Disp: 60 tablet, Rfl: 5 .  cetirizine (ZYRTEC) 10 MG tablet, Take 10 mg by mouth daily as needed., Disp: , Rfl: 5 .  ferrous sulfate 325 (65 FE) MG tablet, Take 1 tablet (325 mg total) by mouth every  morning., Disp: 90 tablet, Rfl: 3 .  fluticasone (FLONASE) 50 MCG/ACT nasal spray, Place 1 spray into both nostrils daily as needed for allergies or rhinitis., Disp: , Rfl:  .  gabapentin (NEURONTIN) 300 MG capsule, TAKE 2 CAPSULES (600 MG TOTAL) BY MOUTH 3 (THREE) TIMES DAILY. (Patient taking differently: Take 600 mg by mouth 2 (two)  times daily. ), Disp: 180 capsule, Rfl: 5 .  naproxen (NAPROSYN) 500 MG tablet, Take 1 tablet (500 mg total) by mouth 2 (two) times daily with a meal., Disp: 60 tablet, Rfl: 5 .  TECFIDERA 240 MG CPDR, Take 1 capsule by mouth twice daily, Disp: 60 capsule, Rfl: 11 .  dalfampridine 10 MG TB12, One po bid, Disp: 60 tablet, Rfl: 11 .  oxybutynin (DITROPAN) 5 MG tablet, Take 1 tablet (5 mg total) by mouth 2 (two) times daily., Disp: 60 tablet, Rfl: 11  PAST MEDICAL HISTORY: Past Medical History:  Diagnosis Date  . Anxiety   . Chronic back pain   . Depression   . Diabetes mellitus without complication (HCC)   . Edema   . MS (multiple sclerosis) (HCC) 09/29/2014    PAST SURGICAL HISTORY: Past Surgical History:  Procedure Laterality Date  . APPENDECTOMY    . CHOLECYSTECTOMY N/A 10/07/2013   Procedure: LAPAROSCOPIC CHOLECYSTECTOMY;  Surgeon: Dalia Heading, MD;  Location: [facility];  Service: General;  Laterality: N/A;  . LAPAROSCOPIC APPENDECTOMY N/A 05/12/2015   Procedure: APPENDECTOMY LAPAROSCOPIC;  Surgeon: Franky Macho, MD;  Location: [facility];  Service: General;  Laterality: N/A;  . WISDOM TOOTH EXTRACTION      FAMILY HISTORY: Family History  Problem Relation Age of Onset  . Diabetes Mother   . Colon polyps Mother        hx of cancer  . Other Mother        DDD Lumber, cervical  . Sleep apnea Father     SOCIAL HISTORY:  Social History   Socioeconomic History  . Marital status: Single    Spouse name: Not on file  . Number of children: Not on file  . Years of education: 2 y colleg  . Highest education level: Not on file  Occupational History  . Occupation: work  Tobacco Use  . Smoking status: Former Smoker    Quit date: 09/16/2007    Years since quitting: 11.8  . Smokeless tobacco: Never Used  Substance and Sexual Activity  . Alcohol use: No    Alcohol/week: 0.0 standard drinks  . Drug use: No    Types: Marijuana    Comment: no drug use since 2010  . Sexual activity:  Yes    Birth control/protection: None  Other Topics Concern  . Not on file  Social History Narrative   Drinks 1-2 large glasses of caffeine daily.      Social Determinants of Health   Financial Resource Strain:   . Difficulty of Paying Living Expenses:   Food Insecurity:   . Worried About Programme researcher, broadcasting/film/video in the Last Year:   . Barista in the Last Year:   Transportation Needs:   . Freight forwarder (Medical):   [name] Lack of Transportation (Non-Medical):   Physical Activity:   . Days of Exercise per Week:   . Minutes of Exercise per Session:   Stress:   . Feeling of Stress :   Social Connections:   . Frequency of Communication with Friends and Family:   . Frequency of Social Gatherings with Friends  and Family:   . Attends Religious Services:   . Active Member of Clubs or Organizations:   . Attends Banker Meetings:   [name] Marital Status:   Intimate Partner Violence:   . Fear of Current or Ex-Partner:   . Emotionally Abused:   [name] Physically Abused:   . Sexually Abused:      PHYSICAL EXAM  Vitals:   07/03/19 1537  BP: (!) 145/76  Pulse: 78  Temp: (!) 97.1 F (36.2 C)  Weight: (!) 351 lb 8 oz (159.4 kg)  Height: 5\' 4"  (1.626 m)    Body mass index is 60.33 kg/m.   General: The patient is well-developed and well-nourished and in no acute distress   Skin: Extremities are without rash or edema.   Neurologic Exam  Mental status: The patient is alert and oriented x 3 at the time of the examination. The patient has apparent normal recent and remote memory, with an apparently normal attention span and concentration ability.   Speech is normal.  Cranial nerves: Extraocular movements are full.  Facial strength and sensation is normal.  Trapezius strength is normal.. No obvious hearing deficits are noted.  Motor:  Muscle bulk is normal.   Tone is mildly increased in the left leg.  Strength was 4+/5 in the left leg.  Sensory: She has normal and  symmetric sensation to touch in arms/legs.  Coordination: Cerebellar testing reveals good finger-nose-finger and heel-to-shin bilaterally.  Gait and station: Station is normal.  Mild left foot drop and slightly wide gait tandem gait is wide.  Romberg is negative..   Reflexes: Deep tendon reflexes are symmetric and normal in the arms and increased at the knees..   25 foot timed gait 9.8 seconds (average of 2 trials)    DIAGNOSTIC DATA (LABS, IMAGING, TESTING) - I reviewed patient records, labs, notes, testing and imaging myself where available.  Lab Results  Component Value Date   WBC 6.9 01/28/2019   HGB 11.6 01/28/2019   HCT 33.0 (L) 01/28/2019   MCV 85 01/28/2019   PLT 303 01/28/2019      Component Value Date/Time   NA 139 01/28/2019 1548   K 4.0 01/28/2019 1548   CL 101 01/28/2019 1548   CO2 24 01/28/2019 1548   GLUCOSE 103 (H) 01/28/2019 1548   GLUCOSE 98 02/25/2018 1650   BUN 6 01/28/2019 1548   CREATININE 0.68 01/28/2019 1548   CALCIUM 9.0 01/28/2019 1548   PROT 7.0 01/28/2019 1548   ALBUMIN 3.5 (L) 01/28/2019 1548   AST 12 01/28/2019 1548   ALT 13 01/28/2019 1548   ALKPHOS 64 01/28/2019 1548   BILITOT 0.2 01/28/2019 1548   GFRNONAA 117 01/28/2019 1548   GFRAA 135 01/28/2019 1548   ____________________________________________  Assessment and plan:  Multiple sclerosis (HCC) - Plan: Comprehensive metabolic panel, CBC with Differential/Platelet, MR BRAIN W WO CONTRAST, MR CERVICAL SPINE W WO CONTRAST  High risk medication use - Plan: Comprehensive metabolic panel, CBC with Differential/Platelet  Left leg weakness  Other fatigue  Gait disturbance  1.    Continue Tecfidera.  We will check CBC with differential and CMP. 2.   Ampyra for gait dysfunction  3.   Stay active and exercise as tolerated.   4.   Return in 6 months she should call sooner if there are new or worsening neurologic symptoms.   Markee Remlinger A. M, MD, PhD, FAAN Certified in Neurology,  Clinical Neurophysiology, Sleep Medicine, Pain Medicine and Neuroimaging Director, Multiple Sclerosis Center  at [facility]  [facility] [address] [place], [place] [address] [phone]

## 2019-07-04 ENCOUNTER — Telehealth: Payer: Self-pay | Admitting: Neurology

## 2019-07-04 LAB — COMPREHENSIVE METABOLIC PANEL
ALT: 12 IU/L (ref 0–32)
AST: 15 IU/L (ref 0–40)
Albumin/Globulin Ratio: 1.3 (ref 1.2–2.2)
Albumin: 4 g/dL (ref 3.8–4.8)
Alkaline Phosphatase: 65 IU/L (ref 39–117)
BUN/Creatinine Ratio: 16 (ref 9–23)
BUN: 11 mg/dL (ref 6–20)
Bilirubin Total: 0.3 mg/dL (ref 0.0–1.2)
CO2: 28 mmol/L (ref 20–29)
Calcium: 9.3 mg/dL (ref 8.7–10.2)
Chloride: 100 mmol/L (ref 96–106)
Creatinine, Ser: 0.68 mg/dL (ref 0.57–1.00)
GFR calc Af Amer: 135 mL/min/{1.73_m2} (ref >59)
GFR calc non Af Amer: 117 mL/min/{1.73_m2} (ref >59)
Globulin, Total: 3.2 g/dL (ref 1.5–4.5)
Glucose: 168 mg/dL — ABNORMAL HIGH (ref 65–99)
Potassium: 4 mmol/L (ref 3.5–5.2)
Sodium: 140 mmol/L (ref 134–144)
Total Protein: 7.2 g/dL (ref 6.0–8.5)

## 2019-07-04 LAB — CBC WITH DIFFERENTIAL/PLATELET
Basophils Absolute: 0 10*3/uL (ref 0.0–0.2)
Basos: 0 %
EOS (ABSOLUTE): 0.1 10*3/uL (ref 0.0–0.4)
Eos: 1 %
Hematocrit: 36.5 % (ref 34.0–46.6)
Hemoglobin: 12.4 g/dL (ref 11.1–15.9)
Immature Grans (Abs): 0 10*3/uL (ref 0.0–0.1)
Immature Granulocytes: 0 %
Lymphocytes Absolute: 1.5 10*3/uL (ref 0.7–3.1)
Lymphs: 26 %
MCH: 28.8 pg (ref 26.6–33.0)
MCHC: 34 g/dL (ref 31.5–35.7)
MCV: 85 fL (ref 79–97)
Monocytes Absolute: 0.3 10*3/uL (ref 0.1–0.9)
Monocytes: 5 %
Neutrophils Absolute: 4 10*3/uL (ref 1.4–7.0)
Neutrophils: 68 %
Platelets: 284 10*3/uL (ref 150–450)
RBC: 4.31 x10E6/uL (ref 3.77–5.28)
RDW: 13.6 % (ref 11.7–15.4)
WBC: 5.9 10*3/uL (ref 3.4–10.8)

## 2019-07-04 NOTE — Telephone Encounter (Signed)
Medicaid order sent to GI. They will obtain the auth and reach out to the patient to schedule.  

## 2019-07-07 IMAGING — DX DG KNEE COMPLETE 4+V*L*
5 series · 5 of 5 positions shown · non-contrast
Comparison: 09/07/2017

CLINICAL DATA: LEFT knee pain and swelling, no injury

EXAM:
LEFT KNEE - COMPLETE 4+ VIEW

[knee ap]
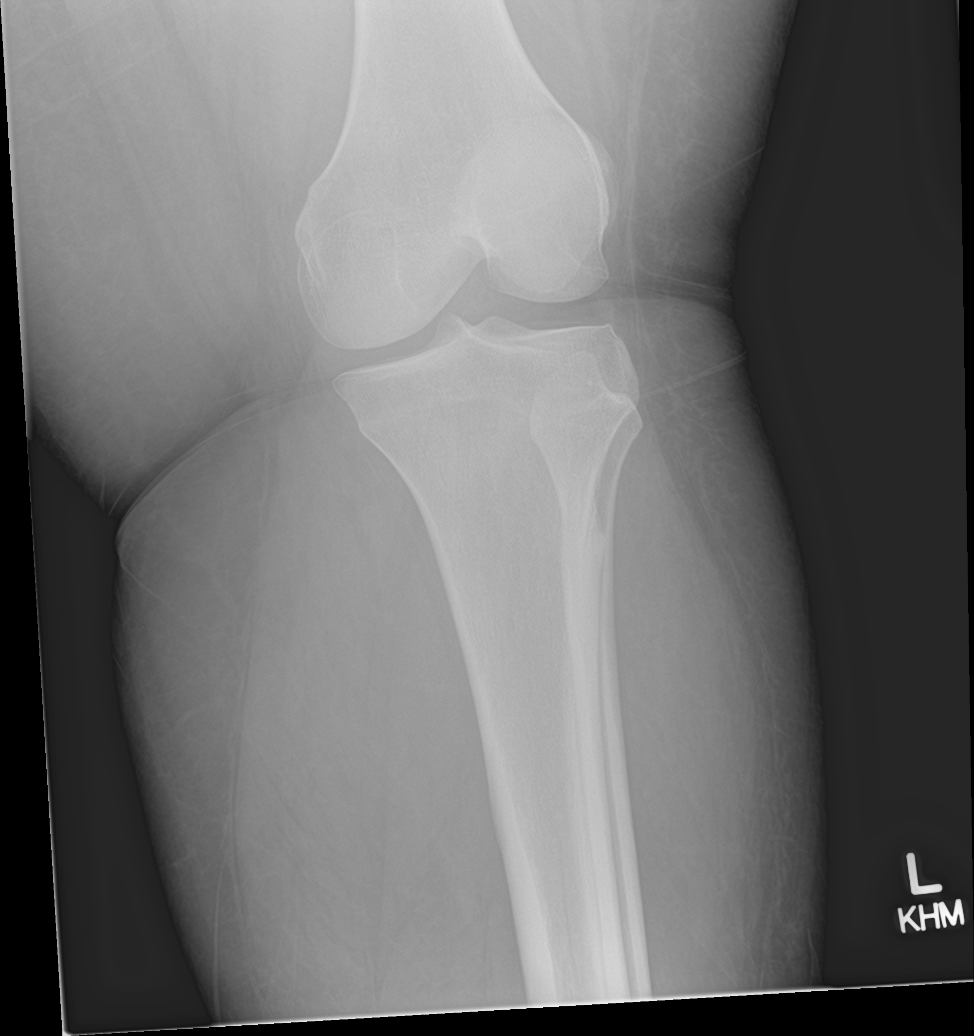

[tunnel]
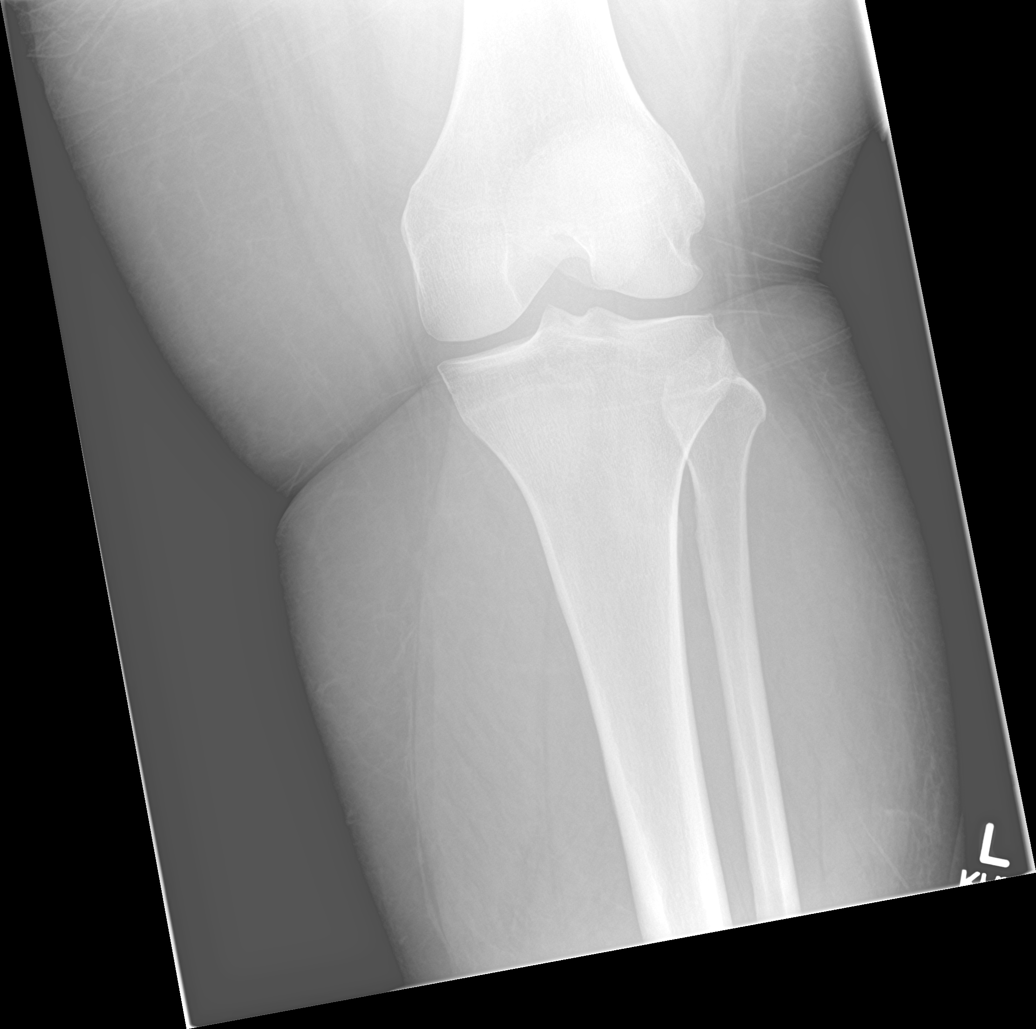

[knee lat (1 of 2)]
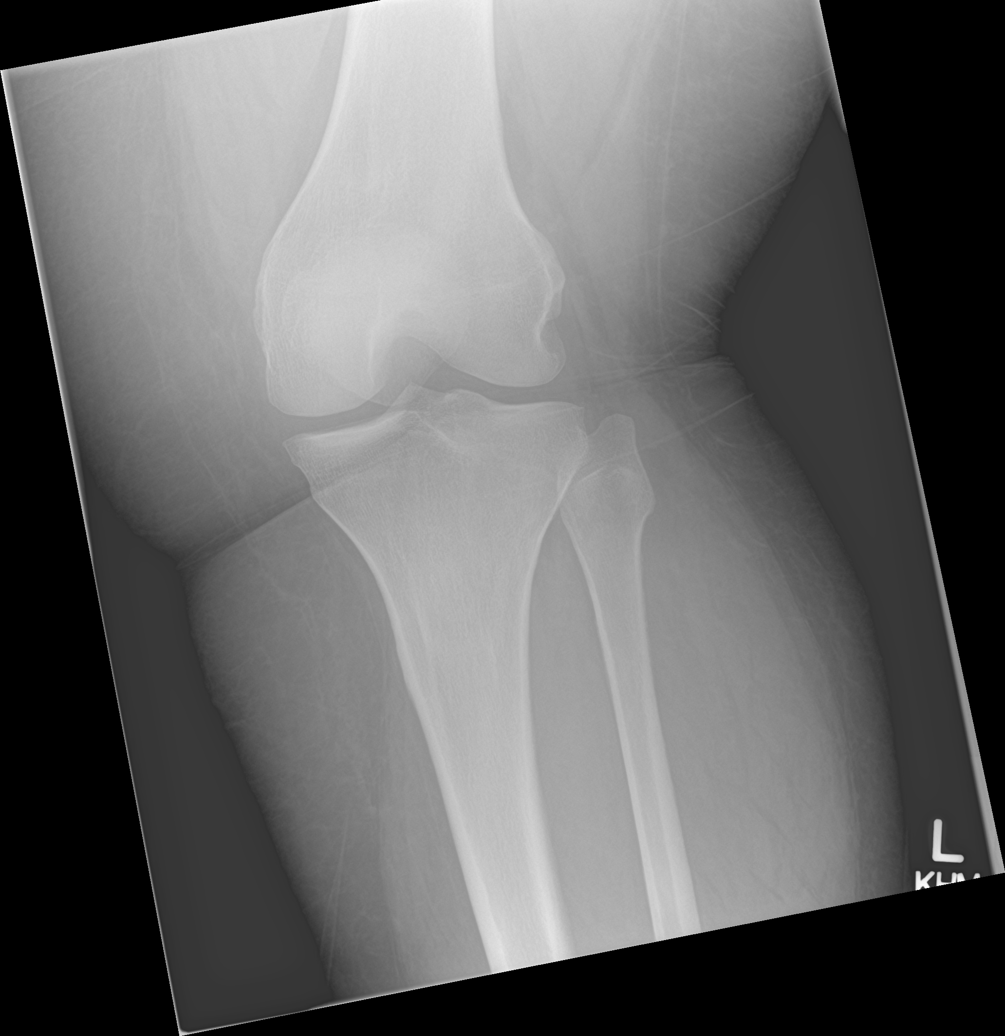

[knee sunrise]
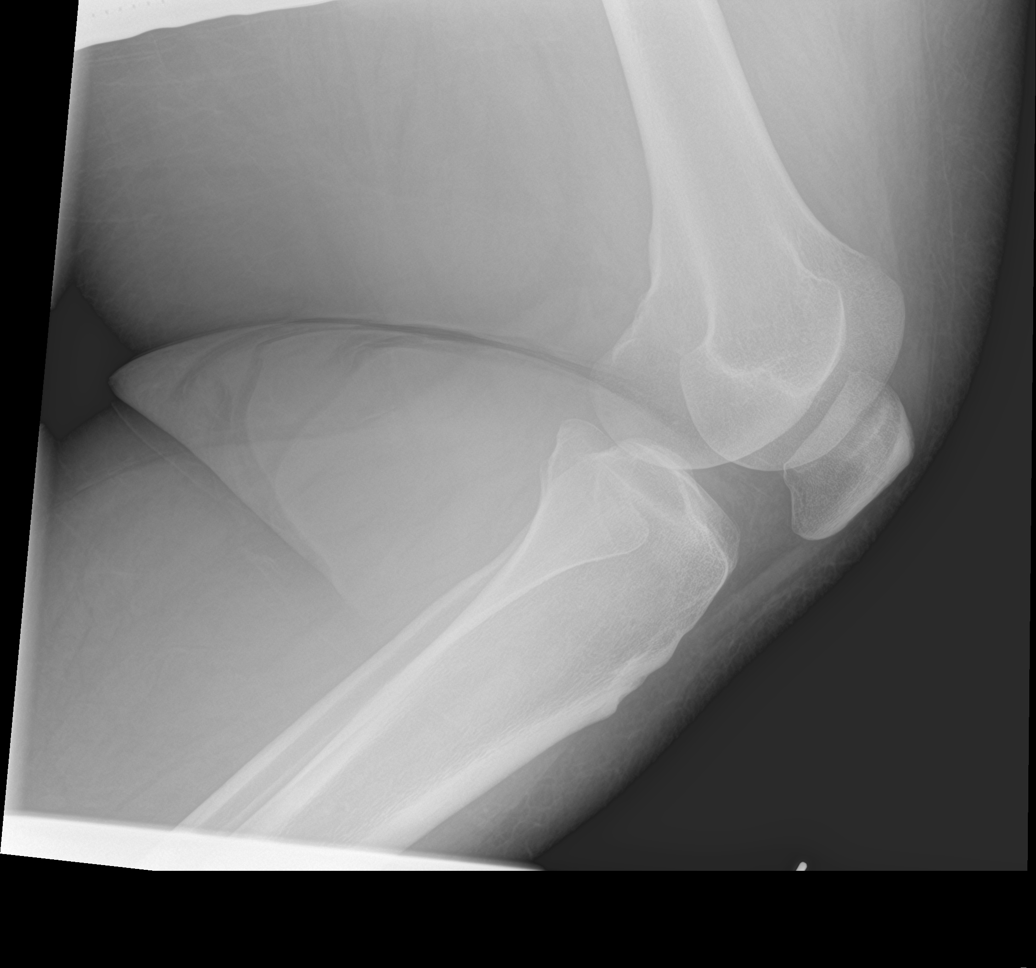

[knee lat (2 of 2)]
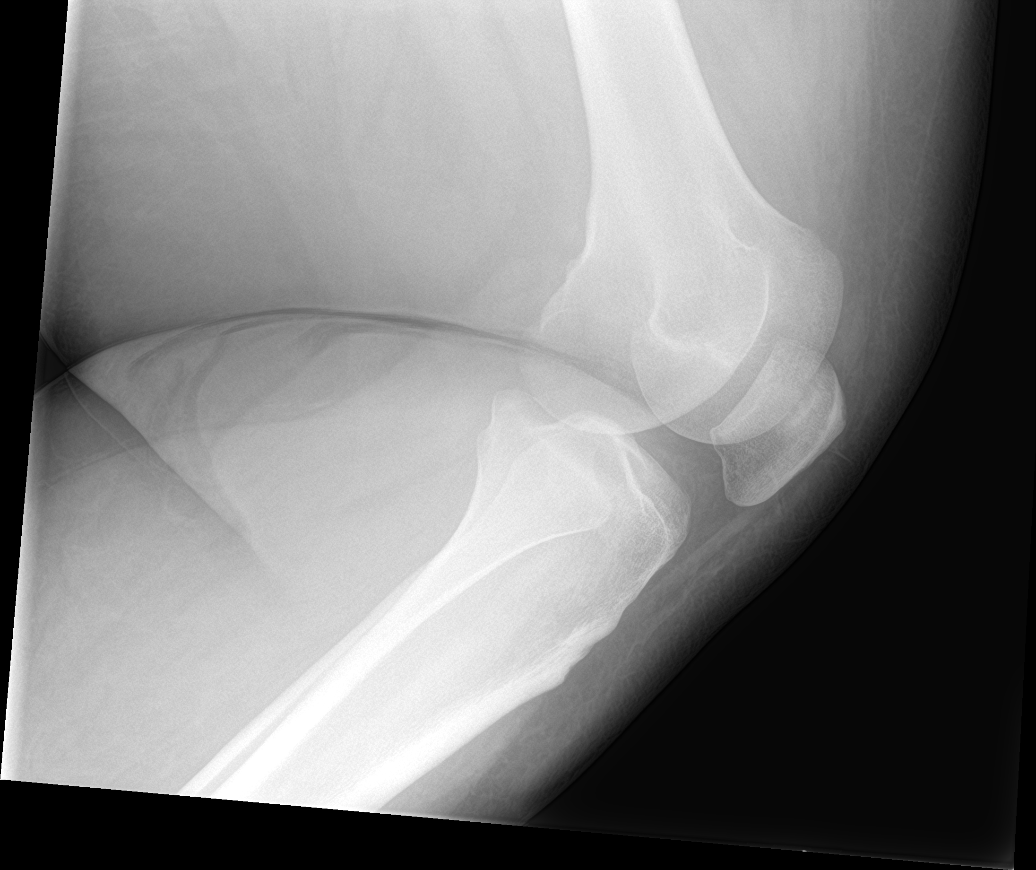

[5 of 5 positions shown; findings below may reference images not displayed]

FINDINGS: Osseous mineralization normal.

Joint spaces preserved.

No acute fracture, dislocation, or bone destruction.

No definite joint effusion.
IMPRESSION: Normal exam.

## 2019-08-07 ENCOUNTER — Other Ambulatory Visit: Payer: Self-pay

## 2019-08-07 ENCOUNTER — Ambulatory Visit
Admission: RE | Admit: 2019-08-07 | Discharge: 2019-08-07 | Disposition: A | Discharge status: Home / Self Care | Payer: Medicaid Other | Source: Ambulatory Visit | Attending: Neurology | Admitting: Neurology

## 2019-08-07 DIAGNOSIS — G35 Multiple sclerosis: Secondary | ICD-10-CM

## 2019-08-07 MED ORDER — GADOBENATE DIMEGLUMINE 529 MG/ML IV SOLN
20.0000 mL | Freq: Once | INTRAVENOUS | Status: AC | PRN
  Administered 2019-08-07: 20 mL via INTRAVENOUS

## 2019-09-04 ENCOUNTER — Other Ambulatory Visit: Payer: Self-pay | Admitting: Orthopaedic Surgery

## 2019-09-04 NOTE — Telephone Encounter (Signed)
Rx request 

## 2019-10-01 ENCOUNTER — Other Ambulatory Visit (HOSPITAL_COMMUNITY)
Admission: RE | Admit: 2019-10-01 | Discharge: 2019-10-01 | Disposition: A | Discharge status: Home / Self Care | Payer: Medicaid Other | Source: Ambulatory Visit | Attending: Adult Health | Admitting: Adult Health

## 2019-10-01 ENCOUNTER — Encounter: Payer: Self-pay | Admitting: Adult Health

## 2019-10-01 ENCOUNTER — Ambulatory Visit (INDEPENDENT_AMBULATORY_CARE_PROVIDER_SITE_OTHER): Payer: Medicaid Other | Admitting: Adult Health

## 2019-10-01 VITALS — BP 124/78 | HR 75 | Ht 64.0 in | Wt 350.8 lb

## 2019-10-01 DIAGNOSIS — Z113 Encounter for screening for infections with a predominantly sexual mode of transmission: Secondary | ICD-10-CM | POA: Diagnosis not present

## 2019-10-01 DIAGNOSIS — Z01419 Encounter for gynecological examination (general) (routine) without abnormal findings: Secondary | ICD-10-CM | POA: Insufficient documentation

## 2019-10-01 NOTE — Progress Notes (Signed)
Patient ID: Brooke [name], female   DOB: June 02, 1985, 34 y.o.   MRN: [id number] History of Present Illness: Brooke [name] is a 34 year old black female,single, G3P0030, in for a well woman gyn exam. She had a normal pap with negative HPV 05/10/2018. PCP is Dr Concepcion Elk.   Current Medications, Allergies, Past Medical History, Past Surgical History, Family History and Social History were reviewed in [employer] record.     Review of Systems:  Patient denies any headaches, hearing loss, fatigue, blurred vision, shortness of breath, chest pain, abdominal pain, problems with bowel movements, urination, or intercourse(not currently active, BF in jail). No joint pain or mood swings. Periods are irregular.  Physical Exam:BP 124/78 (BP Location: Right Arm, Patient Position: Sitting, Cuff Size: Large)   Pulse 75   Ht 5\' 4"  (1.626 m)   Wt (!) 350 lb 12.8 oz (159.1 kg)   LMP 08/23/2019   Breastfeeding No   BMI 60.21 kg/m  General:  Well developed, well nourished, no acute distress Skin:  Warm and dry,has numerous tattoos Neck:  Midline trachea, normal thyroid, good ROM, no lymphadenopathy Lungs; Clear to auscultation bilaterally Breast:  No dominant palpable mass, retraction, or nipple discharge,jas bilateral nipple rods, breasts are large Cardiovascular: Regular rate and rhythm Abdomen:  Soft, non tender, no hepatosplenomegaly,obese Pelvic:  External genitalia is normal in appearance, no lesions.  The vagina is normal in appearance. Urethra has no lesions or masses. The cervix is smooth.  Uterus is felt to be normal size, shape, and contour.  No adnexal masses or tenderness, but difficult due to abdominal girth.Bladder is non tender, no masses felt. CV swab obtained. Extremities/musculoskeletal:  No swelling or varicosities noted, no clubbing or cyanosis Psych:  No mood changes, alert and cooperative,seems happy AA is 0 Fall risk is low PHQ 9 score is 4, no SI  Upstream -  10/01/19 1145      Pregnancy Intention Screening   Does the patient want to become pregnant in the next year? Ok Either Way    Does the patient's partner want to become pregnant in the next year? Ok Either Way    Would the patient like to discuss contraceptive options today? No      Contraception Wrap Up   Current Method No Method - Other Reason    End Method No Method - Other Reason    Contraception Counseling Provided No          Examination chaperoned by 12/01/19 LPN   Impression and Plan: 1. Encounter for well woman exam with routine gynecological exam Physical in 1 year Pap 2023 Labs with PCP   2. Screening examination for STD (sexually transmitted disease) Check HIV,RPR and Hepatitis C antibody CV swab sent Did talk about condom use to prevent STDs

## 2019-10-02 LAB — CERVICOVAGINAL ANCILLARY ONLY
Bacterial Vaginitis (gardnerella): POSITIVE — AB
Candida Glabrata: NEGATIVE
Candida Vaginitis: NEGATIVE
Chlamydia: NEGATIVE
Comment: NEGATIVE
Comment: NEGATIVE
Comment: NEGATIVE
Comment: NEGATIVE
Comment: NEGATIVE
Comment: NORMAL
Neisseria Gonorrhea: NEGATIVE
Trichomonas: NEGATIVE

## 2019-10-02 LAB — HIV ANTIBODY (ROUTINE TESTING W REFLEX): HIV Screen 4th Generation wRfx: NONREACTIVE

## 2019-10-02 LAB — RPR: RPR Ser Ql: NONREACTIVE

## 2019-10-02 LAB — HEPATITIS C ANTIBODY: Hep C Virus Ab: <0.1 s/co ratio (ref 0.0–0.9)

## 2019-10-03 ENCOUNTER — Other Ambulatory Visit: Payer: Self-pay | Admitting: Adult Health

## 2019-10-03 MED ORDER — METRONIDAZOLE 500 MG PO TABS
500.0000 mg | ORAL_TABLET | Freq: Two times a day (BID) | ORAL | 0 refills | Status: DC
Start: 2019-10-03 — End: 2020-01-06

## 2019-10-03 NOTE — Progress Notes (Unsigned)
CV swab +BV will rx flagyl  

## 2019-12-17 ENCOUNTER — Other Ambulatory Visit: Payer: Self-pay | Admitting: Neurology

## 2019-12-17 DIAGNOSIS — G35 Multiple sclerosis: Secondary | ICD-10-CM

## 2019-12-17 DIAGNOSIS — R296 Repeated falls: Secondary | ICD-10-CM

## 2020-01-06 ENCOUNTER — Telehealth: Payer: Self-pay | Admitting: Adult Health

## 2020-01-06 MED ORDER — METRONIDAZOLE 500 MG PO TABS: 500.0000 mg | ORAL_TABLET | Freq: Two times a day (BID) | ORAL | 0 refills | Status: DC

## 2020-01-06 NOTE — Telephone Encounter (Signed)
Will rx flagyl  

## 2020-01-06 NOTE — Telephone Encounter (Signed)
Pt needs to have another refill called in for BV, Thinks she got again after being treated from her sex toy. Please call pt and advise .

## 2020-01-06 NOTE — Addendum Note (Signed)
Addended by: Cyril Mourning A on: 01/06/2020 12:31 PM   Modules accepted: Orders

## 2020-01-07 ENCOUNTER — Ambulatory Visit: Payer: Medicaid Other | Admitting: Neurology

## 2020-01-07 ENCOUNTER — Other Ambulatory Visit: Payer: Self-pay

## 2020-01-07 ENCOUNTER — Encounter: Payer: Self-pay | Admitting: Neurology

## 2020-01-07 VITALS — BP 133/77 | HR 62 | Ht 64.0 in | Wt 345.5 lb

## 2020-01-07 DIAGNOSIS — Z79899 Other long term (current) drug therapy: Secondary | ICD-10-CM | POA: Diagnosis not present

## 2020-01-07 DIAGNOSIS — R269 Unspecified abnormalities of gait and mobility: Secondary | ICD-10-CM

## 2020-01-07 DIAGNOSIS — E559 Vitamin D deficiency, unspecified: Secondary | ICD-10-CM | POA: Insufficient documentation

## 2020-01-07 DIAGNOSIS — R5383 Other fatigue: Secondary | ICD-10-CM

## 2020-01-07 DIAGNOSIS — G35 Multiple sclerosis: Secondary | ICD-10-CM | POA: Diagnosis not present

## 2020-01-07 DIAGNOSIS — R29898 Other symptoms and signs involving the musculoskeletal system: Secondary | ICD-10-CM

## 2020-01-07 MED ORDER — SOLIFENACIN SUCCINATE 10 MG PO TABS: 10.0000 mg | ORAL_TABLET | Freq: Every day | ORAL | 4 refills | Status: DC

## 2020-01-07 NOTE — Progress Notes (Signed)
[facility]  PATIENT: Brooke [name] DOB: May 08, 1985  REFERRING DOCTOR OR PCP:  Dr. Vickey Huger; PCP is Fleet Contras SOURCE: Patient, notes from Dr. Vickey Huger, imaging and laboratory reports, MRI images on PACS.  _________________________________   HISTORICAL  CHIEF COMPLAINT:  Chief Complaint  Patient presents with  . Follow-up    RM 13, alone. Last seen 07/03/2019. Reports left leg keeps giving out on her. Three days ago, she was unable to walk long distances. Did have new numb in left leg that day. When she laid down, sx resolved. Only happened while she was walking.   . Multiple Sclerosis    On Tecfidera. Has noticed increased in unrination. Unsure if she has UTI. Has noticed unusual smell. Contacted [facility] ([facility]) but has not heard back yet.   . Gait Problem    Took dalfampridine for a month but felt it was ineffective and stopped it.    HISTORY OF PRESENT ILLNESS:  Brooke [name] is a 34 year old woman with multiple sclerosis.      Update 01/07/2020: She restarted Tecfidera January 2021 after a miscarriage December 2032r.   She is tolerating it well.   She denies definite exacerbations but has noted more issues with her left leg.  Gait is has reduced balance and the left leg is dragging more.   She has a few stumbles but no falls.  She feels weaker in her left leg than right.  She tried Ampyra but had no benefit.  She had 4 hours of more severe left > right leg weakness one day a couple weeks ago but then was back to her baseline.   She got up from laying down when she noted the worsening and then rested more and was better.   She denies numbness or dysesthesias.   She has urinary urge incontinence but did not do any better on oxybutynin so stopped.  Incontinence occurs once a day on average but twice a day at times.   She has occasional bowel incontinence.    She has some fatigue.  She is sleeping well.   She takes amitriptyline at bedime.   She denies  depression or much anxiety.   Cognition is doing well.     She has lost 6 pounds.    MS History She was diagnosed with MS in 2016 after presenting with visual disturbance, diplopia and poor gait.    She was initially placed on Tecfidera.   She did not note any benefit desired to switch medications and started Ocrevus.  She did not feel good on that medication.  She switched to see me in 2019.  We switched to Tysabri.  Unfortunately, she had allergic reaction after her second infusion and went back on Tecfidera.  She was off for several months while pregnant but went back on after miscarriage and December 2020  Imaging MRI of the brain 05/13/2016 showed multiple T2/FLAIR hyperintense foci in the pons and hemispheres in a pattern and configuration consistent with chronic demyelinating plaque associated with multiple sclerosis. None of the foci appears to be acute. When compared to the MRI dated 01/11/2015, there is no interval change.    There is a normal enhancement pattern and there are no acute findings.  MRI of the cervical spine 05/13/2016 Foci within the posterior spinal cord adjacent to C3 and the left spinal cord adjacent to C5.   Neither of these appear to be acute.      Minimal disc degenerative changes at C5-C6 and  C6-C7 that did not lead to any nerve root impingement.    There is a normal enhancement pattern and there are no acute findings.  MRI of the thoracic spine 05/16/2016 showed a focus within the left anterior spinal cord adjacent to T8-T9 consistent with a demyelinating plaque associated with multiple sclerosis. It appears to be chronic and does not enhance. There is a small focus to the right adjacent to T3-T4.   It is noted on the axial images but not the sagittal images and could represent a small chronic demyelinating plaque or be artifactual. There is small right lateral disc protrusion at T11-T12. There is no nerve root compression..   There is a normal enhancement pattern.  MRI  of the cervical spine 08/07/2019 showed a T2 hyperintense foci within the spinal cord adjacent to C2, C3 and C5.  None of the foci appear to be acute.  They do not enhance.  They were present on the MRI from 05/12/2016.   Mild disc degenerative changes at C5-C6 and C6-C7 that do not lead to spinal stenosis or nerve root compression.  MRI of the brain 08/09/2019 showed multiple T2/FLAIR hyperintense foci in the pons and hemispheres in a pattern configuration consistent with chronic demyelinating plaque associated with multiple sclerosis.  None of the foci appear to be acute and they do not enhance.  Compared to the MRI dated 05/12/2016, there are no definite new lesions.  No acute findings and a normal enhancement pattern.  REVIEW OF SYSTEMS: Constitutional: No fevers, chills, sweats, or change in appetite.  She notes fatigue. Eyes: No visual changes, double vision, eye pain Ear, nose and throat: No hearing loss, ear pain, nasal congestion, sore throat Cardiovascular: No chest pain, palpitations Respiratory: No shortness of breath at rest or with exertion.   No wheezes GastrointestinaI: No nausea, vomiting, diarrhea, abdominal pain, fecal incontinence Genitourinary: No dysuria, urinary retention or frequency.  No nocturia. Musculoskeletal: No neck pain, back pain Integumentary: No rash, pruritus, skin lesions Neurological: as above Psychiatric: She denies depression or anxiety.   Endocrine: No palpitations, diaphoresis, change in appetite, change in weigh or increased thirst Hematologic/Lymphatic: No anemia, purpura, petechiae. Allergic/Immunologic: No itchy/runny eyes, nasal congestion, recent allergic reactions, rashes  ALLERGIES: Allergies  Allergen Reactions  . Lexapro [Escitalopram Oxalate] Other (See Comments)    Suicidal thoughts, lowered libido  . Tysabri [Natalizumab] Hives    Hives, itching, elevated HR and BP.  [name] Latex Hives    HOME MEDICATIONS:  Current Outpatient  Medications:  .  Acetaminophen (TYLENOL PO), Take by mouth. As needed for knee pain., Disp: , Rfl:  .  ALPRAZolam (XANAX) 0.5 MG tablet, Take 0.5 mg by mouth daily as needed., Disp: , Rfl: 2 .  amitriptyline (ELAVIL) 25 MG tablet, Take 25 mg by mouth at bedtime as needed. for sleep, Disp: , Rfl: 2 .  calcium-vitamin D 250-100 MG-UNIT tablet, Take 1 tablet by mouth 2 (two) times daily., Disp: 60 tablet, Rfl: 5 .  cetirizine (ZYRTEC) 10 MG tablet, Take 10 mg by mouth daily as needed., Disp: , Rfl: 5 .  dalfampridine 10 MG TB12, One po bid, Disp: 60 tablet, Rfl: 11 .  ferrous sulfate 325 (65 FE) MG tablet, Take 1 tablet (325 mg total) by mouth every morning., Disp: 90 tablet, Rfl: 3 .  fluticasone (FLONASE) 50 MCG/ACT nasal spray, Place 1 spray into both nostrils daily as needed for allergies or rhinitis., Disp: , Rfl:  .  gabapentin (NEURONTIN) 300 MG capsule, TAKE 2  CAPSULES (600 MG TOTAL) BY MOUTH 3 (THREE) TIMES DAILY., Disp: 180 capsule, Rfl: 5 .  metroNIDAZOLE (FLAGYL) 500 MG tablet, Take 1 tablet (500 mg total) by mouth 2 (two) times daily., Disp: 14 tablet, Rfl: 0 .  naproxen (NAPROSYN) 500 MG tablet, TAKE 1 TABLET BY MOUTH 2 TIMES DAILY WITH A MEAL., Disp: 60 tablet, Rfl: 5 .  oxybutynin (DITROPAN) 5 MG tablet, Take 1 tablet (5 mg total) by mouth 2 (two) times daily., Disp: 60 tablet, Rfl: 11 .  TECFIDERA 240 MG CPDR, Take 1 capsule by mouth twice daily, Disp: 60 capsule, Rfl: 11 .  solifenacin (VESICARE) 10 MG tablet, Take 1 tablet (10 mg total) by mouth daily., Disp: 90 tablet, Rfl: 4  PAST MEDICAL HISTORY: Past Medical History:  Diagnosis Date  . Anxiety   . Chronic back pain   . Depression   . Diabetes mellitus without complication (HCC)   . Edema   . MS (multiple sclerosis) (HCC) 09/29/2014    PAST SURGICAL HISTORY: Past Surgical History:  Procedure Laterality Date  . APPENDECTOMY    . CHOLECYSTECTOMY N/A 10/07/2013   Procedure: LAPAROSCOPIC CHOLECYSTECTOMY;  Surgeon: Dalia Heading, MD;  Location: [facility];  Service: General;  Laterality: N/A;  . LAPAROSCOPIC APPENDECTOMY N/A 05/12/2015   Procedure: APPENDECTOMY LAPAROSCOPIC;  Surgeon: Franky Macho, MD;  Location: [facility];  Service: General;  Laterality: N/A;  . WISDOM TOOTH EXTRACTION      FAMILY HISTORY: Family History  Problem Relation Age of Onset  . Diabetes Mother   . Colon polyps Mother        hx of cancer  . Other Mother        DDD Lumber, cervical  . Sleep apnea Father     SOCIAL HISTORY:  Social History   Socioeconomic History  . Marital status: Single    Spouse name: Not on file  . Number of children: Not on file  . Years of education: 2 y colleg  . Highest education level: Not on file  Occupational History  . Occupation: work  Tobacco Use  . Smoking status: Former Smoker    Types: Cigarettes, E-cigarettes    Quit date: 09/16/2007    Years since quitting: 12.3  . Smokeless tobacco: Never Used  Vaping Use  . Vaping Use: Former  Substance and Sexual Activity  . Alcohol use: No    Alcohol/week: 0.0 standard drinks  . Drug use: Yes    Types: Marijuana  . Sexual activity: Not Currently    Birth control/protection: None  Other Topics Concern  . Not on file  Social History Narrative   Drinks 1-2 large glasses of caffeine daily.      Social Determinants of Health   Financial Resource Strain: Medium Risk  . Difficulty of Paying Living Expenses: Somewhat hard  Food Insecurity: No Food Insecurity  . Worried About Programme researcher, broadcasting/film/video in the Last Year: Never true  . Ran Out of Food in the Last Year: Never true  Transportation Needs: No Transportation Needs  . Lack of Transportation (Medical): No  . Lack of Transportation (Non-Medical): No  Physical Activity: Sufficiently Active  . Days of Exercise per Week: 7 days  . Minutes of Exercise per Session: 150+ min  Stress: No Stress Concern Present  . Feeling of Stress : Not at all  Social Connections: Moderately Integrated  .  Frequency of Communication with Friends and Family: Twice a week  . Frequency of Social Gatherings with Friends and  Family: Once a week  . Attends Religious Services: More than 4 times per year  . Active Member of Clubs or Organizations: Yes  . Attends Banker Meetings: More than 4 times per year  . Marital Status: Widowed  Intimate Partner Violence: Not At Risk  . Fear of Current or Ex-Partner: No  . Emotionally Abused: No  . Physically Abused: No  . Sexually Abused: No     PHYSICAL EXAM  Vitals:   01/07/20 1548  BP: 133/77  Pulse: 62  Weight: (!) 345 lb 8 oz (156.7 kg)  Height: 5\' 4"  (1.626 m)    Body mass index is 59.3 kg/m.   General: The patient is well-developed and well-nourished and in no acute distress   Skin: Extremities are without rash or edema.   Neurologic Exam  Mental status: The patient is alert and oriented x 3 at the time of the examination. The patient has apparent normal recent and remote memory, with an apparently normal attention span and concentration ability.   Speech is normal.  Cranial nerves: Extraocular movements are full.  Facial strength and sensation is normal.  Trapezius strength is normal.. No obvious hearing deficits are noted.  Motor:  Muscle bulk is normal.   Tone is mildly increased in the left leg.  Strength was 4+/5 in the left leg and 5/5 on the right.  Sensory: She has normal and symmetric sensation to touch in arms/legs.  Coordination: Cerebellar testing reveals good finger-nose-finger and heel-to-shin bilaterally.  Gait and station: Station is normal.  She has a mild left foot drop and gait is slightly wide.  Tandem gait is wide.  Romberg is negative..   Reflexes: Deep tendon reflexes are symmetric and normal in the arms and increased at the knees.     DIAGNOSTIC DATA (LABS, IMAGING, TESTING) - I reviewed patient records, labs, notes, testing and imaging myself where available.  Lab Results  Component Value  Date   WBC 5.9 07/03/2019   HGB 12.4 07/03/2019   HCT 36.5 07/03/2019   MCV 85 07/03/2019   PLT 284 07/03/2019      Component Value Date/Time   NA 140 07/03/2019 1624   K 4.0 07/03/2019 1624   CL 100 07/03/2019 1624   CO2 28 07/03/2019 1624   GLUCOSE 168 (H) 07/03/2019 1624   GLUCOSE 98 02/25/2018 1650   BUN 11 07/03/2019 1624   CREATININE 0.68 07/03/2019 1624   CALCIUM 9.3 07/03/2019 1624   PROT 7.2 07/03/2019 1624   ALBUMIN 4.0 07/03/2019 1624   AST 15 07/03/2019 1624   ALT 12 07/03/2019 1624   ALKPHOS 65 07/03/2019 1624   BILITOT 0.3 07/03/2019 1624   GFRNONAA 117 07/03/2019 1624   GFRAA 135 07/03/2019 1624   ____________________________________________  Assessment and plan:  Multiple sclerosis (HCC) - Plan: CBC with Differential/Platelet  High risk medication use - Plan: CBC with Differential/Platelet  Vitamin D deficiency - Plan: VITAMIN D 25 Hydroxy (Vit-D Deficiency, Fractures)  Gait disturbance  Other fatigue  Left leg weakness  1.    Continue Tecfidera.  We will check CBC with differential today 2.   D/c oxybutynin.  Trial of Vesicare to see if it helps more.   If still having issues consider Myrbetriq 3.   Stay active and exercise as tolerated.   4.   Return in 6 months she should call sooner if there are new or worsening neurologic symptoms.   Rhiannon Sassaman A. 09/02/2019, MD, PhD, [facility] Certified in Neurology, Clinical  Neurophysiology, Sleep Medicine, Pain Medicine and Neuroimaging Director, [place] [facility] [address], [place], [place] [address] [phone]

## 2020-01-08 LAB — CBC WITH DIFFERENTIAL/PLATELET
Basophils Absolute: 0 10*3/uL (ref 0.0–0.2)
Basos: 0 %
EOS (ABSOLUTE): 0.1 10*3/uL (ref 0.0–0.4)
Eos: 1 %
Hematocrit: 36.5 % (ref 34.0–46.6)
Hemoglobin: 12.6 g/dL (ref 11.1–15.9)
Immature Grans (Abs): 0 10*3/uL (ref 0.0–0.1)
Immature Granulocytes: 0 %
Lymphocytes Absolute: 2.2 10*3/uL (ref 0.7–3.1)
Lymphs: 31 %
MCH: 29.8 pg (ref 26.6–33.0)
MCHC: 34.5 g/dL (ref 31.5–35.7)
MCV: 86 fL (ref 79–97)
Monocytes Absolute: 0.4 10*3/uL (ref 0.1–0.9)
Monocytes: 6 %
Neutrophils Absolute: 4.3 10*3/uL (ref 1.4–7.0)
Neutrophils: 62 %
Platelets: 264 10*3/uL (ref 150–450)
RBC: 4.23 x10E6/uL (ref 3.77–5.28)
RDW: 13.2 % (ref 11.7–15.4)
WBC: 7.1 10*3/uL (ref 3.4–10.8)

## 2020-01-08 LAB — VITAMIN D 25 HYDROXY (VIT D DEFICIENCY, FRACTURES): Vit D, 25-Hydroxy: 31.5 ng/mL (ref 30.0–100.0)

## 2020-02-11 ENCOUNTER — Telehealth: Payer: Self-pay | Admitting: Neurology

## 2020-02-11 MED ORDER — AMPHETAMINE-DEXTROAMPHET ER 30 MG PO CP24: 30.0000 mg | ORAL_CAPSULE | Freq: Every day | ORAL | 0 refills | Status: DC

## 2020-02-11 NOTE — Telephone Encounter (Signed)
Called pt back. She is requesting refill on adderall XR 30mg  po qd. States pharmacy has no refills on file. She has not been receiving refills from other providers. Advised I will send request to MD. I checked drug registry. She last refilled 08/09/18 #30. Last seen 01/07/20 and next f/u 07/15/20.

## 2020-02-11 NOTE — Telephone Encounter (Signed)
Pt called wanting to discuss her Adderall with RN. Pt states pharmacy is needing provider to send in a new prescription in for her. Please advise.

## 2020-02-19 ENCOUNTER — Telehealth: Payer: Self-pay
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
## 2020-02-24 ENCOUNTER — Other Ambulatory Visit: Payer: Medicaid Other

## 2020-02-27 ENCOUNTER — Other Ambulatory Visit (HOSPITAL_COMMUNITY)
Admission: RE | Admit: 2020-02-27 | Discharge: 2020-02-27 | Disposition: A | Discharge status: Home / Self Care | Payer: Medicaid Other | Source: Ambulatory Visit | Attending: Obstetrics & Gynecology | Admitting: Obstetrics & Gynecology

## 2020-02-27 ENCOUNTER — Other Ambulatory Visit: Payer: Self-pay

## 2020-02-27 ENCOUNTER — Other Ambulatory Visit (INDEPENDENT_AMBULATORY_CARE_PROVIDER_SITE_OTHER): Payer: Medicaid Other | Admitting: *Deleted

## 2020-02-27 DIAGNOSIS — N898 Other specified noninflammatory disorders of vagina: Secondary | ICD-10-CM

## 2020-02-27 NOTE — Progress Notes (Signed)
Chart reviewed for nurse visit. Agree with plan of care.  Adline Potter, NP 02/27/2020 11:49 AM

## 2020-02-27 NOTE — Progress Notes (Signed)
   NURSE VISIT- VAGINITIS/STD  SUBJECTIVE:  Brooke [name] is a 35 y.o. G3P0030 GYN patientfemale here for a vaginal swab for vaginitis screening, STD screen.  She reports the following symptoms: vaginal discharge with odor for 1 month. Denies abnormal vaginal bleeding, significant pelvic pain, fever, or UTI symptoms.  OBJECTIVE:  There were no vitals taken for this visit.  Appears well, in no apparent distress  ASSESSMENT: Vaginal swab for STD screen  PLAN: Self-collected vaginal probe for Gonorrhea, Chlamydia, Trichomonas, Bacterial Vaginosis, Yeast sent to lab Treatment: to be determined once results are received Follow-up as needed if symptoms persist/worsen, or new symptoms develop  Malachy Mood  02/27/2020 11:39 AM

## 2020-02-28 ENCOUNTER — Telehealth: Payer: Self-pay | Admitting: Adult Health

## 2020-02-28 ENCOUNTER — Encounter: Payer: Self-pay | Admitting: Adult Health

## 2020-02-28 DIAGNOSIS — A749 Chlamydial infection, unspecified: Secondary | ICD-10-CM | POA: Insufficient documentation

## 2020-02-28 HISTORY — DX: Chlamydial infection, unspecified: A74.9

## 2020-02-28 LAB — CERVICOVAGINAL ANCILLARY ONLY
Bacterial Vaginitis (gardnerella): POSITIVE — AB
Candida Glabrata: NEGATIVE
Candida Vaginitis: NEGATIVE
Chlamydia: POSITIVE — AB
Comment: NEGATIVE
Comment: NEGATIVE
Comment: NEGATIVE
Comment: NEGATIVE
Comment: NEGATIVE
Comment: NORMAL
Neisseria Gonorrhea: NEGATIVE
Trichomonas: NEGATIVE

## 2020-02-28 MED ORDER — METRONIDAZOLE 500 MG PO TABS
500.0000 mg | ORAL_TABLET | Freq: Two times a day (BID) | ORAL | 0 refills | Status: DC
Start: 2020-02-28 — End: 2020-03-31

## 2020-02-28 MED ORDER — AZITHROMYCIN 500 MG PO TABS: ORAL_TABLET | ORAL | 0 refills | Status: DC

## 2020-02-28 NOTE — Telephone Encounter (Signed)
Left message that rx sent to [facility], look at [facility] and call me monday

## 2020-03-02 ENCOUNTER — Telehealth: Payer: Self-pay | Admitting: Adult Health

## 2020-03-02 NOTE — Telephone Encounter (Signed)
Pt aware of +CHL and +BV to get meds today, no Sex, POC 2/8/ at 11 am will treat partner Teofilo Pod [birth year] with azithromycin 500 mg #2 2 po now and NCCDCR sent

## 2020-03-21 ENCOUNTER — Other Ambulatory Visit: Payer: Self-pay | Admitting: Neurology

## 2020-03-21 DIAGNOSIS — G35 Multiple sclerosis: Secondary | ICD-10-CM

## 2020-03-31 ENCOUNTER — Ambulatory Visit (INDEPENDENT_AMBULATORY_CARE_PROVIDER_SITE_OTHER): Payer: Medicaid Other | Admitting: Adult Health

## 2020-03-31 ENCOUNTER — Other Ambulatory Visit: Payer: Self-pay

## 2020-03-31 ENCOUNTER — Other Ambulatory Visit (HOSPITAL_COMMUNITY)
Admission: RE | Admit: 2020-03-31 | Discharge: 2020-03-31 | Disposition: A | Discharge status: Home / Self Care | Payer: Medicaid Other | Source: Ambulatory Visit | Attending: Adult Health | Admitting: Adult Health

## 2020-03-31 ENCOUNTER — Encounter: Payer: Self-pay | Admitting: Adult Health

## 2020-03-31 VITALS — BP 120/71 | HR 68 | Ht 64.0 in | Wt 325.8 lb

## 2020-03-31 DIAGNOSIS — Z113 Encounter for screening for infections with a predominantly sexual mode of transmission: Secondary | ICD-10-CM | POA: Insufficient documentation

## 2020-03-31 DIAGNOSIS — Z8619 Personal history of other infectious and parasitic diseases: Secondary | ICD-10-CM | POA: Diagnosis present

## 2020-03-31 NOTE — Progress Notes (Signed)
  Subjective:     Patient ID: Brooke [name], female   DOB: 1986/01/19, 35 y.o.   MRN: [id number]  HPI Karrie is a 35 year old black female, single,G3P0030 in for proof of cure for recent +BV and chlamydia, has taken meds but has had had sex with condom and notices odor. Pap was negative with negative HRHPV 05/10/2018.  PCP is Dr Concepcion Elk.  Review of Systems +vaginal odor Reviewed past medical,surgical, social and family history. Reviewed medications and allergies.     Objective:   Physical Exam BP 120/71 (BP Location: Right Arm, Patient Position: Sitting, Cuff Size: Large)   Pulse 68   Ht 5\' 4"  (1.626 m)   Wt (!) 325 lb 12.8 oz (147.8 kg)   LMP 03/06/2020   BMI 55.92 kg/m  Skin warm and dry.Pelvic: external genitalia is normal in appearance no lesions, vagina: white discharge with odor,urethra has no lesions or masses noted, cervix:smooth and bulbous, uterus: normal size, shape and contour, non tender, no masses felt, adnexa: no masses or tenderness noted. Bladder is non tender and no masses felt. CV swab obtained. Fall risk is high  Upstream - 03/31/20 1125      Pregnancy Intention Screening   Does the patient want to become pregnant in the next year? No    Does the patient's partner want to become pregnant in the next year? No    Would the patient like to discuss contraceptive options today? No      Contraception Wrap Up   Current Method Female Condom    End Method Female Condom    Contraception Counseling Provided No         Examination chaperoned by 05/29/20 NP student.    Assessment:     1. History of chlamydia CV swab sent  2. Screening examination for STD (sexually transmitted disease) CV swab sent Check HIV, RPR and hepatitis  C antibody     Plan:     Use condoms Will follow up on Mychart when results in

## 2020-04-01 LAB — HIV ANTIBODY (ROUTINE TESTING W REFLEX): HIV Screen 4th Generation wRfx: NONREACTIVE

## 2020-04-01 LAB — CERVICOVAGINAL ANCILLARY ONLY
Bacterial Vaginitis (gardnerella): POSITIVE — AB
Candida Glabrata: NEGATIVE
Candida Vaginitis: POSITIVE — AB
Chlamydia: NEGATIVE
Comment: NEGATIVE
Comment: NEGATIVE
Comment: NEGATIVE
Comment: NEGATIVE
Comment: NEGATIVE
Comment: NORMAL
Neisseria Gonorrhea: NEGATIVE
Trichomonas: NEGATIVE

## 2020-04-01 LAB — HEPATITIS C ANTIBODY: Hep C Virus Ab: <0.1 s/co ratio (ref 0.0–0.9)

## 2020-04-01 LAB — RPR: RPR Ser Ql: NONREACTIVE

## 2020-04-02 ENCOUNTER — Other Ambulatory Visit: Payer: Self-pay | Admitting: Adult Health

## 2020-04-02 MED ORDER — METRONIDAZOLE 0.75 % VA GEL: VAGINAL | 0 refills | Status: DC

## 2020-04-02 MED ORDER — FLUCONAZOLE 150 MG PO TABS
ORAL_TABLET | ORAL | 1 refills | Status: DC
Start: 2020-04-02 — End: 2020-06-04

## 2020-04-02 NOTE — Progress Notes (Signed)
Will rx metrogel and diflucan for +BV and yeas on vaginal swab all other results negative

## 2020-04-14 ENCOUNTER — Other Ambulatory Visit: Payer: Self-pay | Admitting: Adult Health

## 2020-04-14 MED ORDER — METRONIDAZOLE 500 MG PO TABS
500.0000 mg | ORAL_TABLET | Freq: Two times a day (BID) | ORAL | 0 refills | Status: DC
Start: 2020-04-14 — End: 2020-06-04

## 2020-04-14 NOTE — Progress Notes (Signed)
Will rx flagyl  

## 2020-05-28 ENCOUNTER — Telehealth: Payer: Self-pay | Admitting: Orthopaedic Surgery

## 2020-06-04 ENCOUNTER — Other Ambulatory Visit: Payer: Self-pay

## 2020-06-04 ENCOUNTER — Other Ambulatory Visit (HOSPITAL_COMMUNITY)
Admission: RE | Admit: 2020-06-04 | Discharge: 2020-06-04 | Disposition: A | Discharge status: Home / Self Care | Payer: Medicaid Other | Source: Ambulatory Visit | Attending: Adult Health | Admitting: Adult Health

## 2020-06-04 ENCOUNTER — Encounter: Payer: Self-pay | Admitting: Adult Health

## 2020-06-04 ENCOUNTER — Ambulatory Visit (INDEPENDENT_AMBULATORY_CARE_PROVIDER_SITE_OTHER): Payer: Medicaid Other | Admitting: Adult Health

## 2020-06-04 VITALS — BP 120/78 | HR 74 | Ht 64.0 in | Wt 322.6 lb

## 2020-06-04 DIAGNOSIS — Z3202 Encounter for pregnancy test, result negative: Secondary | ICD-10-CM

## 2020-06-04 DIAGNOSIS — N898 Other specified noninflammatory disorders of vagina: Secondary | ICD-10-CM | POA: Insufficient documentation

## 2020-06-04 DIAGNOSIS — Z113 Encounter for screening for infections with a predominantly sexual mode of transmission: Secondary | ICD-10-CM | POA: Insufficient documentation

## 2020-06-04 LAB — POCT URINE PREGNANCY: Preg Test, Ur: NEGATIVE

## 2020-06-04 MED ORDER — METRONIDAZOLE 500 MG PO TABS
500.0000 mg | ORAL_TABLET | Freq: Two times a day (BID) | ORAL | 1 refills | Status: DC
Start: 2020-06-04 — End: 2020-07-29
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
## 2020-06-05 LAB — CERVICOVAGINAL ANCILLARY ONLY
Bacterial Vaginitis (gardnerella): POSITIVE — AB
Candida Glabrata: NEGATIVE
Candida Vaginitis: NEGATIVE
Chlamydia: NEGATIVE
Comment: NEGATIVE
Comment: NEGATIVE
Comment: NEGATIVE
Comment: NEGATIVE
Comment: NEGATIVE
Comment: NORMAL
Neisseria Gonorrhea: NEGATIVE
Trichomonas: NEGATIVE

## 2020-07-08 ENCOUNTER — Ambulatory Visit: Payer: Medicaid Other | Admitting: Neurology

## 2020-07-15 ENCOUNTER — Ambulatory Visit: Payer: Medicaid Other | Admitting: Neurology

## 2020-07-15 ENCOUNTER — Encounter: Payer: Self-pay | Admitting: Neurology

## 2020-07-22 ENCOUNTER — Encounter: Payer: Self-pay | Admitting: Neurology

## 2020-07-22 ENCOUNTER — Ambulatory Visit: Payer: Medicaid Other | Admitting: Neurology

## 2020-07-22 VITALS — BP 131/79 | HR 71 | Ht 64.0 in | Wt 324.0 lb

## 2020-07-22 DIAGNOSIS — R29898 Other symptoms and signs involving the musculoskeletal system: Secondary | ICD-10-CM | POA: Diagnosis not present

## 2020-07-22 DIAGNOSIS — R269 Unspecified abnormalities of gait and mobility: Secondary | ICD-10-CM

## 2020-07-22 DIAGNOSIS — Z79899 Other long term (current) drug therapy: Secondary | ICD-10-CM | POA: Diagnosis not present

## 2020-07-22 DIAGNOSIS — R3915 Urgency of urination: Secondary | ICD-10-CM

## 2020-07-22 DIAGNOSIS — G35 Multiple sclerosis: Secondary | ICD-10-CM

## 2020-07-22 MED ORDER — CYCLOBENZAPRINE HCL 5 MG PO TABS
5.0000 mg | ORAL_TABLET | Freq: Three times a day (TID) | ORAL | 2 refills | Status: DC | PRN
Start: 2020-07-22 — End: 2020-12-15

## 2020-07-22 NOTE — Progress Notes (Signed)
[facility]  PATIENT: Brooke [name] DOB: 04/07/1985  REFERRING DOCTOR OR PCP:  Dr. Vickey Huger; PCP is Fleet Contras SOURCE: Patient, notes from Dr. Vickey Huger, imaging and laboratory reports, MRI images on PACS.  _________________________________   HISTORICAL  CHIEF COMPLAINT:  Chief Complaint  Patient presents with  . Multiple Sclerosis    Routine Visit Room 13, boyfriend Terrell and nephew Kemar in room    HISTORY OF PRESENT ILLNESS:  Brooke [name] is a 35 year old woman with multiple sclerosis.      Update 07/22/2020 She restarted Tecfidera January 2021 after a miscarriage December 2020.   She is tolerating it well.   She denies definite exacerbations but has noted more issues with her left leg.  She is experiencing frontal headaches, also left occipital pain.   NSAIDs helps a  little bit.     Gait is off due to reduced balance   She stumbles and has had a couple falls, one recently on stairs.  The left leg is dragging more.   She has a few stumbles but no falls.  The  left leg is weaker than right.  Ampyra had no benefit.   She has some tingling in her fingertips. No major numbness or dysesthesias.   Gabapentin is doing well.   She has urinary urge incontinence that is doing better.  Solifenacin works better than oxybutynin.      She has some fatigue.  She is sleeping well.   She takes amitriptyline at bedime.   She denies depression but has some anxiety.  She takes xanax prn.    Cognition is doing ok.   She takes Adderall now and then with only mild benefit.   .    We discussed taking vit D supplements    MS History She was diagnosed with MS in 2016 after presenting with visual disturbance, diplopia and poor gait.    She was initially placed on Tecfidera.   She did not note any benefit desired to switch medications and started Ocrevus.  She did not feel good on that medication.  She switched to see me in 2019.  We switched to Tysabri.  Unfortunately,  she had allergic reaction after her second infusion and went back on Tecfidera.  She was off for several months while pregnant but went back on after miscarriage and December 2020  Imaging MRI of the brain 05/13/2016 showed multiple T2/FLAIR hyperintense foci in the pons and hemispheres in a pattern and configuration consistent with chronic demyelinating plaque associated with multiple sclerosis. None of the foci appears to be acute. When compared to the MRI dated 01/11/2015, there is no interval change.    There is a normal enhancement pattern and there are no acute findings.  MRI of the cervical spine 05/13/2016 Foci within the posterior spinal cord adjacent to C3 and the left spinal cord adjacent to C5.   Neither of these appear to be acute.      Minimal disc degenerative changes at C5-C6 and C6-C7 that did not lead to any nerve root impingement.    There is a normal enhancement pattern and there are no acute findings.  MRI of the thoracic spine 05/16/2016 showed a focus within the left anterior spinal cord adjacent to T8-T9 consistent with a demyelinating plaque associated with multiple sclerosis. It appears to be chronic and does not enhance. There is a small focus to the right adjacent to T3-T4.   It is noted on the axial images but not the  sagittal images and could represent a small chronic demyelinating plaque or be artifactual. There is small right lateral disc protrusion at T11-T12. There is no nerve root compression..   There is a normal enhancement pattern.  MRI of the cervical spine 08/07/2019 showed a T2 hyperintense foci within the spinal cord adjacent to C2, C3 and C5.  None of the foci appear to be acute.  They do not enhance.  They were present on the MRI from 05/12/2016.   Mild disc degenerative changes at C5-C6 and C6-C7 that do not lead to spinal stenosis or nerve root compression.  MRI of the brain 08/09/2019 showed multiple T2/FLAIR hyperintense foci in the pons and hemispheres in a  pattern configuration consistent with chronic demyelinating plaque associated with multiple sclerosis.  None of the foci appear to be acute and they do not enhance.  Compared to the MRI dated 05/12/2016, there are no definite new lesions.  No acute findings and a normal enhancement pattern.  MRI brain 08/07/2019 shows multiple T2/FLAIR hyperintense foci in the pons and hemispheres in a pattern configuration consistent with chronic demyelinating plaque associated with multiple sclerosis.  None of the foci appear to be acute and they do not enhance.  Compared to the MRI dated 05/12/2016, there are no definite new lesions  MRI cervical spine 08/09/2019  T2 hyperintense foci within the spinal cord adjacent to C2, C3 and C5.  None of the foci appear to be acute.  They do not enhance.  They were present on the MRI from 05/12/2016. 2.   Mild disc degenerative changes at C5-C6 and C6-C7 that do not lead to spinal stenosis or nerve root compression.  REVIEW OF SYSTEMS: Constitutional: No fevers, chills, sweats, or change in appetite.  She notes fatigue. Eyes: No visual changes, double vision, eye pain Ear, nose and throat: No hearing loss, ear pain, nasal congestion, sore throat Cardiovascular: No chest pain, palpitations Respiratory: No shortness of breath at rest or with exertion.   No wheezes GastrointestinaI: No nausea, vomiting, diarrhea, abdominal pain, fecal incontinence Genitourinary: No dysuria, urinary retention or frequency.  No nocturia. Musculoskeletal: No neck pain, back pain Integumentary: No rash, pruritus, skin lesions Neurological: as above Psychiatric: She denies depression or anxiety.   Endocrine: No palpitations, diaphoresis, change in appetite, change in weigh or increased thirst Hematologic/Lymphatic: No anemia, purpura, petechiae. Allergic/Immunologic: No itchy/runny eyes, nasal congestion, recent allergic reactions, rashes  ALLERGIES: Allergies  Allergen Reactions  . Lexapro  [Escitalopram Oxalate] Other (See Comments)    Suicidal thoughts, lowered libido  . Tysabri [Natalizumab] Hives    Hives, itching, elevated HR and BP.  [name] Latex Hives    HOME MEDICATIONS:  Current Outpatient Medications:  .  Acetaminophen (TYLENOL PO), Take by mouth. As needed for knee pain., Disp: , Rfl:  .  ALPRAZolam (XANAX) 0.5 MG tablet, Take 0.5 mg by mouth daily as needed., Disp: , Rfl: 2 .  amitriptyline (ELAVIL) 25 MG tablet, Take 25 mg by mouth at bedtime as needed. for sleep, Disp: , Rfl: 2 .  amphetamine-dextroamphetamine (ADDERALL XR) 30 MG 24 hr capsule, Take 1 capsule (30 mg total) by mouth daily., Disp: 30 capsule, Rfl: 0 .  calcium-vitamin D 250-100 MG-UNIT tablet, Take 1 tablet by mouth 2 (two) times daily., Disp: 60 tablet, Rfl: 5 .  cetirizine (ZYRTEC) 10 MG tablet, Take 10 mg by mouth daily as needed., Disp: , Rfl: 5 .  cyclobenzaprine (FLEXERIL) 5 MG tablet, Take 1 tablet (5 mg total) by mouth  every 8 (eight) hours as needed for muscle spasms., Disp: 90 tablet, Rfl: 2 .  ferrous sulfate 325 (65 FE) MG tablet, Take 1 tablet (325 mg total) by mouth every morning., Disp: 90 tablet, Rfl: 3 .  fluticasone (FLONASE) 50 MCG/ACT nasal spray, Place 1 spray into both nostrils daily as needed for allergies or rhinitis., Disp: , Rfl:  .  gabapentin (NEURONTIN) 300 MG capsule, TAKE 2 CAPSULES (600 MG TOTAL) BY MOUTH 3 (THREE) TIMES DAILY. (Patient taking differently: Take 600 mg by mouth 2 (two) times daily.), Disp: 180 capsule, Rfl: 5 .  metroNIDAZOLE (FLAGYL) 500 MG tablet, Take 1 tablet (500 mg total) by mouth 2 (two) times daily., Disp: 14 tablet, Rfl: 1 .  Multiple Vitamins-Minerals (ONE-A-DAY WOMENS PO), Take by mouth., Disp: , Rfl:  .  naproxen (NAPROSYN) 500 MG tablet, TAKE 1 TABLET BY MOUTH 2 TIMES DAILY WITH A MEAL, Disp: 60 tablet, Rfl: 5 .  solifenacin (VESICARE) 10 MG tablet, Take 1 tablet (10 mg total) by mouth daily., Disp: 90 tablet, Rfl: 4 .  TECFIDERA 240 MG CPDR,  TAKE 1 CAPSULE TWICE A DAY, Disp: 60 capsule, Rfl: 11 .  tiZANidine (ZANAFLEX) 4 MG tablet, Take 4 mg by mouth 2 (two) times daily as needed., Disp: , Rfl:   PAST MEDICAL HISTORY: Past Medical History:  Diagnosis Date  . Anxiety   . Chlamydia 02/28/2020   Treated 02/28/20 POC________  . Chronic back pain   . Depression   . Diabetes mellitus without complication (HCC)   . Edema   . MS (multiple sclerosis) (HCC) 09/29/2014    PAST SURGICAL HISTORY: Past Surgical History:  Procedure Laterality Date  . APPENDECTOMY    . CHOLECYSTECTOMY N/A 10/07/2013   Procedure: LAPAROSCOPIC CHOLECYSTECTOMY;  Surgeon: Dalia Heading, MD;  Location: [facility];  Service: General;  Laterality: N/A;  . LAPAROSCOPIC APPENDECTOMY N/A 05/12/2015   Procedure: APPENDECTOMY LAPAROSCOPIC;  Surgeon: Franky Macho, MD;  Location: [facility];  Service: General;  Laterality: N/A;  . WISDOM TOOTH EXTRACTION      FAMILY HISTORY: Family History  Problem Relation Age of Onset  . Diabetes Mother   . Colon polyps Mother        hx of cancer  . Other Mother        DDD Lumber, cervical  . Sleep apnea Father     SOCIAL HISTORY:  Social History   Socioeconomic History  . Marital status: Single    Spouse name: Not on file  . Number of children: Not on file  . Years of education: 2 y colleg  . Highest education level: Not on file  Occupational History  . Occupation: work  Tobacco Use  . Smoking status: Former Smoker    Types: Cigarettes, E-cigarettes    Quit date: 09/16/2007    Years since quitting: 12.8  . Smokeless tobacco: Never Used  Vaping Use  . Vaping Use: Former  Substance and Sexual Activity  . Alcohol use: No    Alcohol/week: 0.0 standard drinks  . Drug use: Yes    Types: Marijuana    Comment: when hurting  . Sexual activity: Yes    Birth control/protection: None, Condom  Other Topics Concern  . Not on file  Social History Narrative   Drinks 1-2 large glasses of caffeine daily.      Social  Determinants of Health   Financial Resource Strain: Medium Risk  . Difficulty of Paying Living Expenses: Somewhat hard  Food Insecurity: No Food Insecurity  .  Worried About Programme researcher, broadcasting/film/video in the Last Year: Never true  . Ran Out of Food in the Last Year: Never true  Transportation Needs: No Transportation Needs  . Lack of Transportation (Medical): No  . Lack of Transportation (Non-Medical): No  Physical Activity: Sufficiently Active  . Days of Exercise per Week: 7 days  . Minutes of Exercise per Session: 150+ min  Stress: No Stress Concern Present  . Feeling of Stress : Not at all  Social Connections: Moderately Integrated  . Frequency of Communication with Friends and Family: Twice a week  . Frequency of Social Gatherings with Friends and Family: Once a week  . Attends Religious Services: More than 4 times per year  . Active Member of Clubs or Organizations: Yes  . Attends Banker Meetings: More than 4 times per year  . Marital Status: Widowed  Intimate Partner Violence: Not At Risk  . Fear of Current or Ex-Partner: No  . Emotionally Abused: No  . Physically Abused: No  . Sexually Abused: No     PHYSICAL EXAM  Vitals:   07/22/20 1412  BP: 131/79  Pulse: 71  Weight: (!) 324 lb (147 kg)  Height: 5\' 4"  (1.626 m)    Body mass index is 55.61 kg/m.   General: The patient is well-developed and well-nourished and in no acute distress   Skin: Extremities are without rash or edema.   Neurologic Exam  Mental status: The patient is alert and oriented x 3 at the time of the examination. The patient has apparent normal recent and remote memory, with an apparently normal attention span and concentration ability.   Speech is normal.  Cranial nerves: Extraocular movements are full.  Facial strength and sensation is normal.  Trapezius strength is normal.. No obvious hearing deficits are noted.  Motor:  Muscle bulk is normal.   Tone is mildly increased in the left  leg.  Strength was 4+/5 in the left leg and 5/5 on the right.  Sensory: She has normal and symmetric sensation to touch in arms/legs.  Coordination: Cerebellar testing reveals good finger-nose-finger and heel-to-shin bilaterally.  Gait and station: Station is normal.  Mild left foot drop.   Tandem is poor.    Romberg is negative..   Reflexes: Deep tendon reflexes are symmetric and normal in the arms and increased at the knees.     DIAGNOSTIC DATA (LABS, IMAGING, TESTING) - I reviewed patient records, labs, notes, testing and imaging myself where available.  Lab Results  Component Value Date   WBC 7.1 01/07/2020   HGB 12.6 01/07/2020   HCT 36.5 01/07/2020   MCV 86 01/07/2020   PLT 264 01/07/2020      Component Value Date/Time   NA 140 07/03/2019 1624   K 4.0 07/03/2019 1624   CL 100 07/03/2019 1624   CO2 28 07/03/2019 1624   GLUCOSE 168 (H) 07/03/2019 1624   GLUCOSE 98 02/25/2018 1650   BUN 11 07/03/2019 1624   CREATININE 0.68 07/03/2019 1624   CALCIUM 9.3 07/03/2019 1624   PROT 7.2 07/03/2019 1624   ALBUMIN 4.0 07/03/2019 1624   AST 15 07/03/2019 1624   ALT 12 07/03/2019 1624   ALKPHOS 65 07/03/2019 1624   BILITOT 0.3 07/03/2019 1624   GFRNONAA 117 07/03/2019 1624   GFRAA 135 07/03/2019 1624   ____________________________________________  Assessment and plan:  Multiple sclerosis (HCC) - Plan: CBC with Differential/Platelet, Hepatic function panel  High risk medication use - Plan: CBC with Differential/Platelet,  Hepatic function panel  Gait disturbance  Left leg weakness  Urinary urgency  1.    Continue Tecfidera. Check Hep function panel and CBC with differential today 2.    Continue Vesicare for urge incontinence 3.    Stay active and exercise as tolerated.   4.    Flexeril for spasticity and headache 5.  Return in 6 months she should call sooner if there are new or worsening neurologic symptoms.   Tammy Ericsson A. Epimenio FootSater, MD, PhD, FAAN Certified in  Neurology, Clinical Neurophysiology, Sleep Medicine, Pain Medicine and Neuroimaging Director, [facility]  [facility] [address], [address] [place], [place] [address] [phone]

## 2020-07-23 LAB — CBC WITH DIFFERENTIAL/PLATELET
Basophils Absolute: 0 10*3/uL (ref 0.0–0.2)
Basos: 0 %
EOS (ABSOLUTE): 0.1 10*3/uL (ref 0.0–0.4)
Eos: 1 %
Hematocrit: 40.2 % (ref 34.0–46.6)
Hemoglobin: 14.1 g/dL (ref 11.1–15.9)
Immature Grans (Abs): 0 10*3/uL (ref 0.0–0.1)
Immature Granulocytes: 0 %
Lymphocytes Absolute: 2.2 10*3/uL (ref 0.7–3.1)
Lymphs: 41 %
MCH: 30.5 pg (ref 26.6–33.0)
MCHC: 35.1 g/dL (ref 31.5–35.7)
MCV: 87 fL (ref 79–97)
Monocytes Absolute: 0.3 10*3/uL (ref 0.1–0.9)
Monocytes: 6 %
Neutrophils Absolute: 2.7 10*3/uL (ref 1.4–7.0)
Neutrophils: 52 %
Platelets: 291 10*3/uL (ref 150–450)
RBC: 4.63 x10E6/uL (ref 3.77–5.28)
RDW: 13.3 % (ref 11.7–15.4)
WBC: 5.4 10*3/uL (ref 3.4–10.8)

## 2020-07-23 LAB — HEPATIC FUNCTION PANEL
ALT: 23 IU/L (ref 0–32)
AST: 15 IU/L (ref 0–40)
Albumin: 4.2 g/dL (ref 3.8–4.8)
Alkaline Phosphatase: 70 IU/L (ref 44–121)
Bilirubin Total: 0.3 mg/dL (ref 0.0–1.2)
Bilirubin, Direct: 0.12 mg/dL (ref 0.00–0.40)
Total Protein: 7.3 g/dL (ref 6.0–8.5)

## 2020-07-29 ENCOUNTER — Other Ambulatory Visit: Payer: Self-pay | Admitting: Adult Health

## 2020-07-29 MED ORDER — METRONIDAZOLE 500 MG PO TABS: 500.0000 mg | ORAL_TABLET | Freq: Two times a day (BID) | ORAL | 1 refills | Status: DC

## 2020-07-29 NOTE — Progress Notes (Signed)
Refilled flagyl at [facility]

## 2020-08-10 ENCOUNTER — Telehealth: Payer: Self-pay | Admitting: *Deleted

## 2020-08-10 ENCOUNTER — Other Ambulatory Visit: Payer: Self-pay | Admitting: Adult Health

## 2020-08-10 MED ORDER — METRONIDAZOLE 500 MG PO TABS: 500.0000 mg | ORAL_TABLET | Freq: Two times a day (BID) | ORAL | 1 refills | Status: DC

## 2020-08-10 NOTE — Telephone Encounter (Signed)
Pt finished BV med on 6/15 and she is still noticing an odor. Does she need more med? Thanks!! JSY

## 2020-08-10 NOTE — Progress Notes (Signed)
Wil refill flagyl

## 2020-08-19 ENCOUNTER — Other Ambulatory Visit: Payer: Self-pay

## 2020-08-19 ENCOUNTER — Encounter: Payer: Self-pay | Admitting: *Deleted

## 2020-08-19 ENCOUNTER — Ambulatory Visit (INDEPENDENT_AMBULATORY_CARE_PROVIDER_SITE_OTHER): Payer: Medicaid Other | Admitting: *Deleted

## 2020-08-19 VITALS — Ht 64.0 in | Wt 325.2 lb

## 2020-08-19 DIAGNOSIS — Z3201 Encounter for pregnancy test, result positive: Secondary | ICD-10-CM | POA: Diagnosis not present

## 2020-08-19 LAB — POCT URINE PREGNANCY: Preg Test, Ur: POSITIVE — AB

## 2020-08-19 MED ORDER — PNV PRENATAL PLUS MULTIVITAMIN 27-1 MG PO TABS: ORAL_TABLET | ORAL | 12 refills | Status: DC

## 2020-08-19 NOTE — Progress Notes (Signed)
Chart reviewed for nurse visit. Agree with plan of care. Rx PNV Adline Potter, NP 08/19/2020 5:13 PM

## 2020-08-19 NOTE — Addendum Note (Signed)
Addended by: Cyril Mourning A on: 08/19/2020 05:15 PM   Modules accepted: Orders

## 2020-08-19 NOTE — Progress Notes (Signed)
   NURSE VISIT- PREGNANCY CONFIRMATION   SUBJECTIVE:  Brooke [name] is a 35 y.o. G19P0030 female at [redacted]w[redacted]d by certain LMP of Patient's last menstrual period was 07/11/2020. Here for pregnancy confirmation.  Home pregnancy test: positive x 1   She reports nausea.  She is not taking prenatal vitamins.    OBJECTIVE:  Ht 5\' 4"  (1.626 m)   Wt (!) 325 lb 3.2 oz (147.5 kg)   LMP 07/11/2020   BMI 55.82 kg/m   Appears well, in no apparent distress  Results for orders placed or performed in visit on 08/19/20 (from the past 24 hour(s))  POCT urine pregnancy   Collection Time: 08/19/20  2:58 PM  Result Value Ref Range   Preg Test, Ur Positive (A) Negative    ASSESSMENT: Positive pregnancy test, [redacted]w[redacted]d by LMP    PLAN: Schedule for dating ultrasound in 3 weeks Prenatal vitamins: note routed to [facility] to send prescription   Nausea medicines: not currently needed   OB packet given: Yes  [redacted]w[redacted]d  08/19/2020 3:15 PM

## 2020-09-08 ENCOUNTER — Other Ambulatory Visit: Payer: Self-pay | Admitting: Obstetrics & Gynecology

## 2020-09-08 DIAGNOSIS — O3680X0 Pregnancy with inconclusive fetal viability, not applicable or unspecified: Secondary | ICD-10-CM

## 2020-09-09 ENCOUNTER — Ambulatory Visit (INDEPENDENT_AMBULATORY_CARE_PROVIDER_SITE_OTHER): Payer: Medicaid Other

## 2020-09-09 ENCOUNTER — Other Ambulatory Visit: Payer: Self-pay

## 2020-09-09 DIAGNOSIS — O3680X0 Pregnancy with inconclusive fetal viability, not applicable or unspecified: Secondary | ICD-10-CM

## 2020-09-09 DIAGNOSIS — Z3A08 8 weeks gestation of pregnancy: Secondary | ICD-10-CM | POA: Diagnosis not present

## 2020-09-09 NOTE — Progress Notes (Signed)
[place] 8+4 wks,single IUP with YS, positive FHT 174 bpm,normal ovaries,crl 20.88 mm

## 2020-10-13 ENCOUNTER — Other Ambulatory Visit: Payer: Self-pay | Admitting: Obstetrics & Gynecology

## 2020-10-13 ENCOUNTER — Other Ambulatory Visit: Payer: Medicaid Other

## 2020-10-13 DIAGNOSIS — Z3682 Encounter for antenatal screening for nuchal translucency: Secondary | ICD-10-CM

## 2020-10-14 ENCOUNTER — Ambulatory Visit (INDEPENDENT_AMBULATORY_CARE_PROVIDER_SITE_OTHER): Payer: Medicaid Other | Admitting: Advanced Practice Midwife

## 2020-10-14 ENCOUNTER — Ambulatory Visit (INDEPENDENT_AMBULATORY_CARE_PROVIDER_SITE_OTHER): Payer: Medicaid Other

## 2020-10-14 ENCOUNTER — Ambulatory Visit: Payer: Medicaid Other | Admitting: *Deleted

## 2020-10-14 ENCOUNTER — Encounter: Payer: Self-pay | Admitting: Advanced Practice Midwife

## 2020-10-14 ENCOUNTER — Other Ambulatory Visit: Payer: Self-pay

## 2020-10-14 VITALS — BP 108/61 | HR 81 | Wt 336.4 lb

## 2020-10-14 DIAGNOSIS — O99210 Obesity complicating pregnancy, unspecified trimester: Secondary | ICD-10-CM | POA: Insufficient documentation

## 2020-10-14 DIAGNOSIS — Z3490 Encounter for supervision of normal pregnancy, unspecified, unspecified trimester: Secondary | ICD-10-CM | POA: Insufficient documentation

## 2020-10-14 DIAGNOSIS — Z3480 Encounter for supervision of other normal pregnancy, unspecified trimester: Secondary | ICD-10-CM | POA: Diagnosis not present

## 2020-10-14 DIAGNOSIS — Z3682 Encounter for antenatal screening for nuchal translucency: Secondary | ICD-10-CM | POA: Diagnosis not present

## 2020-10-14 DIAGNOSIS — O0990 Supervision of high risk pregnancy, unspecified, unspecified trimester: Secondary | ICD-10-CM | POA: Insufficient documentation

## 2020-10-14 DIAGNOSIS — Z3A13 13 weeks gestation of pregnancy: Secondary | ICD-10-CM

## 2020-10-14 DIAGNOSIS — Z1379 Encounter for other screening for genetic and chromosomal anomalies: Secondary | ICD-10-CM

## 2020-10-14 DIAGNOSIS — O99212 Obesity complicating pregnancy, second trimester: Secondary | ICD-10-CM

## 2020-10-14 LAB — POCT URINALYSIS DIPSTICK OB
Blood, UA: NEGATIVE
Glucose, UA: NEGATIVE
Ketones, UA: NEGATIVE
Leukocytes, UA: NEGATIVE
Nitrite, UA: POSITIVE
POC,PROTEIN,UA: NEGATIVE

## 2020-10-14 MED ORDER — BLOOD PRESSURE MONITOR MISC: 0 refills | Status: DC

## 2020-10-14 MED ORDER — ASPIRIN 81 MG PO CHEW: 162.0000 mg | CHEWABLE_TABLET | Freq: Every day | ORAL | 8 refills | Status: DC

## 2020-10-14 NOTE — Patient Instructions (Signed)
Brooke [name], thank you for choosing our office today! We appreciate the opportunity to meet your healthcare needs. You may receive a short survey by mail, e-mail, or through [employer]. If you are happy with your care we would appreciate if you could take just a few minutes to complete the survey questions. We read all of your comments and take your feedback very seriously. Thank you again for choosing our office.  [facility] [employer] [facility] at [facility]  [facility] at [facility] ([address] [place], [place] [address]) Entrance C, located off of [address] Free 24/7 valet parking   Nausea & Vomiting Have saltine crackers or pretzels by your bed and eat a few bites before you raise your head out of bed in the morning Eat small frequent meals throughout the day instead of large meals Drink plenty of fluids throughout the day to stay hydrated, just don't drink a lot of fluids with your meals.  This can make your stomach fill up faster making you feel sick Do not brush your teeth right after you eat Products with real ginger are good for nausea, like ginger ale and ginger hard candy Make sure it says made with real ginger! Sucking on sour candy like lemon heads is also good for nausea If your prenatal vitamins make you nauseated, take them at night so you will sleep through the nausea Sea Bands If you feel like you need medicine for the nausea & vomiting please let us know If you are unable to keep any fluids or food down please let us know   Constipation Drink plenty of fluid, preferably water, throughout the day Eat foods high in fiber such as fruits, vegetables, and grains Exercise, such as walking, is a good way to keep your bowels regular Drink warm fluids, especially warm prune juice, or decaf coffee Eat a 1/2 cup of real oatmeal (not instant), 1/2 cup applesauce, and 1/2-1 cup warm prune juice every day If needed, you may take Colace (docusate sodium) stool softener  once or twice a day to help keep the stool soft.  If you still are having problems with constipation, you may take Miralax once daily as needed to help keep your bowels regular.   Home Blood Pressure Monitoring for Patients   Your provider has recommended that you check your blood pressure (BP) at least once a week at home. If you do not have a blood pressure cuff at home, one will be provided for you. Contact your provider if you have not received your monitor within 1 week.   Helpful Tips for Accurate Home Blood Pressure Checks  Don't smoke, exercise, or drink caffeine 30 minutes before checking your BP Use the restroom before checking your BP (a full bladder can raise your pressure) Relax in a comfortable upright chair Feet on the ground Left arm resting comfortably on a flat surface at the level of your heart Legs uncrossed Back supported Sit quietly and don't talk Place the cuff on your bare arm Adjust snuggly, so that only two fingertips can fit between your skin and the top of the cuff Check 2 readings separated by at least one minute Keep a log of your BP readings For a visual, please reference this diagram: http://ccnc.care/bpdiagram  Provider Name: [facility]     Phone: [phone]  Zone 1: ALL CLEAR  Continue to monitor your symptoms:  BP reading is less than 140 (top number) or less than 90 (bottom  number)  No right upper stomach pain No headaches or seeing spots No feeling nauseated or throwing up No swelling in face and hands  Zone 2: CAUTION Call your doctor's office for any of the following:  BP reading is greater than 140 (top number) or greater than 90 (bottom number)  Stomach pain under your ribs in the middle or right side Headaches or seeing spots Feeling nauseated or throwing up Swelling in face and hands  Zone 3: EMERGENCY  Seek immediate medical care if you have any of the following:  BP reading is greater than160 (top number) or greater than  110 (bottom number) Severe headaches not improving with Tylenol Serious difficulty catching your breath Any worsening symptoms from Zone 2    First Trimester of Pregnancy The first trimester of pregnancy is from week 1 until the end of week 12 (months 1 through 3). A week after a sperm fertilizes an egg, the egg will implant on the wall of the uterus. This embryo will begin to develop into a baby. Genes from you and your partner are forming the baby. The female genes determine whether the baby is a boy or a girl. At 6-8 weeks, the eyes and face are formed, and the heartbeat can be seen on ultrasound. At the end of 12 weeks, all the baby's organs are formed.  Now that you are pregnant, you will want to do everything you can to have a healthy baby. Two of the most important things are to get good prenatal care and to follow your health care provider's instructions. Prenatal care is all the medical care you receive before the baby's birth. This care will help prevent, find, and treat any problems during the pregnancy and childbirth. BODY CHANGES Your body goes through many changes during pregnancy. The changes vary from woman to woman.  You may gain or lose a couple of pounds at first. You may feel sick to your stomach (nauseous) and throw up (vomit). If the vomiting is uncontrollable, call your health care provider. You may tire easily. You may develop headaches that can be relieved by medicines approved by your health care provider. You may urinate more often. Painful urination may mean you have a bladder infection. You may develop heartburn as a result of your pregnancy. You may develop constipation because certain hormones are causing the muscles that push waste through your intestines to slow down. You may develop hemorrhoids or swollen, bulging veins (varicose veins). Your breasts may begin to grow larger and become tender. Your nipples may stick out more, and the tissue that surrounds them  (areola) may become darker. Your gums may bleed and may be sensitive to brushing and flossing. Dark spots or blotches (chloasma, mask of pregnancy) may develop on your face. This will likely fade after the baby is born. Your menstrual periods will stop. You may have a loss of appetite. You may develop cravings for certain kinds of food. You may have changes in your emotions from day to day, such as being excited to be pregnant or being concerned that something may go wrong with the pregnancy and baby. You may have more vivid and strange dreams. You may have changes in your hair. These can include thickening of your hair, rapid growth, and changes in texture. Some women also have hair loss during or after pregnancy, or hair that feels dry or thin. Your hair will most likely return to normal after your baby is born. WHAT TO EXPECT AT YOUR PRENATAL  VISITS During a routine prenatal visit: You will be weighed to make sure you and the baby are growing normally. Your blood pressure will be taken. Your abdomen will be measured to track your baby's growth. The fetal heartbeat will be listened to starting around week 10 or 12 of your pregnancy. Test results from any previous visits will be discussed. Your health care provider may ask you: How you are feeling. If you are feeling the baby move. If you have had any abnormal symptoms, such as leaking fluid, bleeding, severe headaches, or abdominal cramping. If you have any questions. Other tests that may be performed during your first trimester include: Blood tests to find your blood type and to check for the presence of any previous infections. They will also be used to check for low iron levels (anemia) and Rh antibodies. Later in the pregnancy, blood tests for diabetes will be done along with other tests if problems develop. Urine tests to check for infections, diabetes, or protein in the urine. An ultrasound to confirm the proper growth and development  of the baby. An amniocentesis to check for possible genetic problems. Fetal screens for spina bifida and Down syndrome. You may need other tests to make sure you and the baby are doing well. HOME CARE INSTRUCTIONS  Medicines Follow your health care provider's instructions regarding medicine use. Specific medicines may be either safe or unsafe to take during pregnancy. Take your prenatal vitamins as directed. If you develop constipation, try taking a stool softener if your health care provider approves. Diet Eat regular, well-balanced meals. Choose a variety of foods, such as meat or vegetable-based protein, fish, milk and low-fat dairy products, vegetables, fruits, and whole grain breads and cereals. Your health care provider will help you determine the amount of weight gain that is right for you. Avoid raw meat and uncooked cheese. These carry germs that can cause birth defects in the baby. Eating four or five small meals rather than three large meals a day may help relieve nausea and vomiting. If you start to feel nauseous, eating a few soda crackers can be helpful. Drinking liquids between meals instead of during meals also seems to help nausea and vomiting. If you develop constipation, eat more high-fiber foods, such as fresh vegetables or fruit and whole grains. Drink enough fluids to keep your urine clear or pale yellow. Activity and Exercise Exercise only as directed by your health care provider. Exercising will help you: Control your weight. Stay in shape. Be prepared for labor and delivery. Experiencing pain or cramping in the lower abdomen or low back is a good sign that you should stop exercising. Check with your health care provider before continuing normal exercises. Try to avoid standing for long periods of time. Move your legs often if you must stand in one place for a long time. Avoid heavy lifting. Wear low-heeled shoes, and practice good posture. You may continue to have sex  unless your health care provider directs you otherwise. Relief of Pain or Discomfort Wear a good support bra for breast tenderness.   Take warm sitz baths to soothe any pain or discomfort caused by hemorrhoids. Use hemorrhoid cream if your health care provider approves.   Rest with your legs elevated if you have leg cramps or low back pain. If you develop varicose veins in your legs, wear support hose. Elevate your feet for 15 minutes, 3-4 times a day. Limit salt in your diet. Prenatal Care Schedule your prenatal visits by the  twelfth week of pregnancy. They are usually scheduled monthly at first, then more often in the last 2 months before delivery. Write down your questions. Take them to your prenatal visits. Keep all your prenatal visits as directed by your health care provider. Safety Wear your seat belt at all times when driving. Make a list of emergency phone numbers, including numbers for family, friends, the hospital, and police and fire departments. General Tips Ask your health care provider for a referral to a local prenatal education class. Begin classes no later than at the beginning of month 6 of your pregnancy. Ask for help if you have counseling or nutritional needs during pregnancy. Your health care provider can offer advice or refer you to specialists for help with various needs. Do not use hot tubs, steam rooms, or saunas. Do not douche or use tampons or scented sanitary pads. Do not cross your legs for long periods of time. Avoid cat litter boxes and soil used by cats. These carry germs that can cause birth defects in the baby and possibly loss of the fetus by miscarriage or stillbirth. Avoid all smoking, herbs, alcohol, and medicines not prescribed by your health care provider. Chemicals in these affect the formation and growth of the baby. Schedule a dentist appointment. At home, brush your teeth with a soft toothbrush and be gentle when you floss. SEEK MEDICAL CARE IF:   You have dizziness. You have mild pelvic cramps, pelvic pressure, or nagging pain in the abdominal area. You have persistent nausea, vomiting, or diarrhea. You have a bad smelling vaginal discharge. You have pain with urination. You notice increased swelling in your face, hands, legs, or ankles. SEEK IMMEDIATE MEDICAL CARE IF:  You have a fever. You are leaking fluid from your vagina. You have spotting or bleeding from your vagina. You have severe abdominal cramping or pain. You have rapid weight gain or loss. You vomit blood or material that looks like coffee grounds. You are exposed to [place] measles and have never had them. You are exposed to fifth disease or chickenpox. You develop a severe headache. You have shortness of breath. You have any kind of trauma, such as from a fall or a car accident. Document Released: 02/01/2001 Document Revised: 06/24/2013 Document Reviewed: 12/18/2012 [facility] Patient Information 2015 [place], [place]. This information is not intended to replace advice given to you by your health care provider. Make sure you discuss any questions you have with your health care provider.

## 2020-10-14 NOTE — Progress Notes (Signed)
INITIAL OBSTETRICAL VISIT Patient name: Brooke [name] MRN [id number]  Date of birth: [birth year] Chief Complaint:   Initial Prenatal Visit  History of Present Illness:   Brooke [name] is a 35 y.o. G74P0030 African-American female at [redacted]w[redacted]d by LMP c/w u/s at 8.4 weeks with an Estimated Date of Delivery: 04/17/21 being seen today for her initial obstetrical visit.   Patient's last menstrual period was 07/11/2020. Her obstetrical history is significant for  ectopic x 2, SAB x 1 . Has been on MS meds until the beginning of preg when Dr Epimenio Foot instructed her to stop (gabapentin, Tecfidera, Vesicare)- she has been doing well since and will keep [place] updated. Today she reports no complaints.  Last pap March 2020. Results were: NILM w/ HRHPV negative  Depression screen [facility] 2/9 10/14/2020 10/01/2019 04/12/2018 04/12/2018 04/22/2014  Decreased Interest 1 0 1 1 -  Down, Depressed, Hopeless 0 0 2 2 0  PHQ - 2 Score 1 0 3 3 0  Altered sleeping 0 1 2 - -  Tired, decreased energy 1 3 2  - -  Change in appetite 0 0 3 - -  Feeling bad or failure about yourself  0 0 2 - -  Trouble concentrating 0 0 1 - -  Moving slowly or fidgety/restless 0 0 0 - -  Suicidal thoughts 0 0 0 - -  PHQ-9 Score 2 4 13  - -  Difficult doing work/chores - Not difficult at all Somewhat difficult - -  Some recent data might be hidden     GAD 7 : Generalized Anxiety Score 10/14/2020 10/01/2019  Nervous, Anxious, on Edge 0 0  Control/stop worrying 0 1  Worry too much - different things 1 1  Trouble relaxing 0 0  Restless 0 0  Easily annoyed or irritable 1 0  Afraid - awful might happen 0 0  Total GAD 7 Score 2 2  Anxiety Difficulty - Not difficult at all     Review of Systems:   Pertinent items are noted in HPI Denies cramping/contractions, leakage of fluid, vaginal bleeding, abnormal vaginal discharge w/ itching/odor/irritation, headaches, visual changes, shortness of breath, chest pain, abdominal pain, severe  nausea/vomiting, or problems with urination or bowel movements unless otherwise stated above.  Pertinent History Reviewed:  Reviewed past medical,surgical, social, obstetrical and family history.  Reviewed problem list, medications and allergies. OB History  Gravida Para Term Preterm AB Living  4       3 0  SAB IAB Ectopic Multiple Live Births  1   2        # Outcome Date GA Lbr Len/2nd Weight Sex Delivery Anes PTL Lv  4 Current           3 SAB 2013          2 Ectopic 2009          1 Ectopic            Physical Assessment:   Vitals:   10/14/20 1149  BP: 108/61  Pulse: 81  Weight: (!) 336 lb 6.4 oz (152.6 kg)  Body mass index is 57.74 kg/m.       Physical Examination:  General appearance - well appearing, and in no distress  Mental status - alert, oriented to person, place, and time  Psych:  She has a normal mood and affect  Skin - warm and dry, normal color, no suspicious lesions noted  Chest - effort normal, all lung fields clear to auscultation  bilaterally  Heart - normal rate and regular rhythm  Abdomen - soft, nontender  Extremities:  No swelling or varicosities noted  Pelvic - not indicated  Thin prep pap is not done    TODAY'S NT [place] 13+4 wks,measurements c/w dates,fhr 152 bpm,normal ovaries,NB present,NT 1.4 mm,CRL 79.85 mm,anterior placenta gr 0  Results for orders placed or performed in visit on 10/14/20 (from the past 24 hour(s))  POC Urinalysis Dipstick OB   Collection Time: 10/14/20 12:08 PM  Result Value Ref Range   Color, UA     Clarity, UA     Glucose, UA Negative Negative   Bilirubin, UA     Ketones, UA neg    Spec Grav, UA     Blood, UA neg    pH, UA     POC,PROTEIN,UA Negative Negative, Trace, Small (1+), Moderate (2+), Large (3+), 4+   Urobilinogen, UA     Nitrite, UA positive    Leukocytes, UA Negative Negative   Appearance     Odor      Assessment & Plan:  1) Low-Risk Pregnancy G4P0030 at [redacted]w[redacted]d with an Estimated Date of Delivery:  04/17/21   2) Initial OB visit  3) MS, on no meds currently (was taking gabapentin, Tecfidera, and Vesicare) and feels well; she asked about the possibility of waterbirth and we reviewed the need for her to be able to move quickly in an emergency and that may/may not be possible depending on how symptomatic she is at term; sees Dr Epimenio [facility] in [facility]  4) BMI 57, rec weight gain 11-20lbs  Meds:  Meds ordered this encounter  Medications   Blood Pressure Monitor MISC    Sig: For regular home bp monitoring during pregnancy    Dispense:  1 each    Refill:  0    Please mail to patient   aspirin 81 MG chewable tablet    Sig: Chew 2 tablets (162 mg total) by mouth daily.    Dispense:  60 tablet    Refill:  8    Order Specific Question:   Supervising Provider    Answer:   Duane Lope H [[id number]]    Initial labs obtained Continue prenatal vitamins Reviewed n/v relief measures and warning s/s to report Reviewed recommended weight gain based on pre-gravid BMI Encouraged well-balanced diet Genetic & carrier screening discussed: requests NT/IT, declines Panorama and Horizon  Ultrasound discussed; fetal survey: requested CCNC completed> form faxed if has or is planning to apply for medicaid The nature of [employer] for [employer] with multiple MDs and other Advanced Practice Providers was explained to patient; also emphasized that fellows, residents, and students are part of our team. Does not have home bp cuff. Office bp cuff given: no. Rx sent: yes. Check bp weekly, let us know if consistently >140/90.   Indications for ASA therapy (per uptodate) OR Two or more of the following: Nulliparity Yes Obesity (BMI>30 kg/m2) Yes  Indications for early A1C (per uptodate) BMI >=25 (>=23 in Asian women) AND one of the following High-risk race/ethnicity (eg, African American, Latino, Native American, [place] American, [place] Islander) Yes  Follow-up: Return in about 4 weeks (around  11/11/2020) for LROB, in person, 2nd IT.   Orders Placed This Encounter  Procedures   GC/Chlamydia Probe Amp   Urine Culture   CBC/D/Plt+RPR+Rh+ABO+RubIgG...   Pain Management Screening Profile (10S)   Integrated 1   HgB A1c   POC Urinalysis Dipstick OB    Marcelle Smiling  Clelia Croft [facility] 10/14/2020 12:38 PM

## 2020-10-14 NOTE — Progress Notes (Signed)
[place] 13+4 wks,measurements c/w dates,fhr 152 bpm,normal ovaries,NB present,NT 1.4 mm,CRL 79.85 mm,anterior placenta gr 0

## 2020-10-15 LAB — PMP SCREEN PROFILE (10S), URINE
Amphetamine Scrn, Ur: NEGATIVE ng/mL
BARBITURATE SCREEN URINE: NEGATIVE ng/mL
BENZODIAZEPINE SCREEN, URINE: NEGATIVE ng/mL
CANNABINOIDS UR QL SCN: POSITIVE ng/mL — AB
Cocaine (Metab) Scrn, Ur: NEGATIVE ng/mL
Creatinine(Crt), U: 138.8 mg/dL (ref 20.0–300.0)
Methadone Screen, Urine: NEGATIVE ng/mL
OXYCODONE+OXYMORPHONE UR QL SCN: NEGATIVE ng/mL
Opiate Scrn, Ur: NEGATIVE ng/mL
Ph of Urine: 7.3 (ref 4.5–8.9)
Phencyclidine Qn, Ur: NEGATIVE ng/mL
Propoxyphene Scrn, Ur: NEGATIVE ng/mL

## 2020-10-15 LAB — HEMOGLOBIN A1C
Est. average glucose Bld gHb Est-mCnc: 126 mg/dL
Hgb A1c MFr Bld: 6 % — ABNORMAL HIGH (ref 4.8–5.6)

## 2020-10-18 LAB — URINE CULTURE

## 2020-10-19 ENCOUNTER — Encounter: Payer: Self-pay | Admitting: Advanced Practice Midwife

## 2020-10-19 ENCOUNTER — Other Ambulatory Visit: Payer: Self-pay | Admitting: Advanced Practice Midwife

## 2020-10-19 ENCOUNTER — Telehealth: Payer: Self-pay | Admitting: *Deleted

## 2020-10-19 DIAGNOSIS — O99891 Other specified diseases and conditions complicating pregnancy: Secondary | ICD-10-CM | POA: Insufficient documentation

## 2020-10-19 DIAGNOSIS — R8271 Bacteriuria: Secondary | ICD-10-CM | POA: Insufficient documentation

## 2020-10-19 DIAGNOSIS — F1290 Cannabis use, unspecified, uncomplicated: Secondary | ICD-10-CM

## 2020-10-19 LAB — STATUS REPORT

## 2020-10-19 MED ORDER — AMOXICILLIN-POT CLAVULANATE 500-125 MG PO TABS: 1.0000 | ORAL_TABLET | Freq: Two times a day (BID) | ORAL | 0 refills | Status: DC

## 2020-10-19 NOTE — Telephone Encounter (Signed)
LMOVM requesting patient see mychart message regarding lab results.  Urine culture positive for Ecoli and A1C elevated so she will need early glucola.  Advised to call our office to get this set up ASAP.

## 2020-10-21 ENCOUNTER — Other Ambulatory Visit: Payer: Medicaid Other

## 2020-10-21 DIAGNOSIS — R7309 Other abnormal glucose: Secondary | ICD-10-CM

## 2020-10-21 LAB — CBC/D/PLT+RPR+RH+ABO+RUBIGG...
Antibody Screen: NEGATIVE
Basophils Absolute: 0 10*3/uL (ref 0.0–0.2)
Basos: 0 %
EOS (ABSOLUTE): 0 10*3/uL (ref 0.0–0.4)
Eos: 1 %
HCV Ab: <0.1 s/co ratio (ref 0.0–0.9)
HIV Screen 4th Generation wRfx: NONREACTIVE
Hematocrit: 32.6 % — ABNORMAL LOW (ref 34.0–46.6)
Hemoglobin: 11.7 g/dL (ref 11.1–15.9)
Hepatitis B Surface Ag: NEGATIVE
Immature Grans (Abs): 0 10*3/uL (ref 0.0–0.1)
Immature Granulocytes: 0 %
Lymphocytes Absolute: 2.1 10*3/uL (ref 0.7–3.1)
Lymphs: 30 %
MCH: 30.4 pg (ref 26.6–33.0)
MCHC: 35.9 g/dL — ABNORMAL HIGH (ref 31.5–35.7)
MCV: 85 fL (ref 79–97)
Monocytes Absolute: 0.4 10*3/uL (ref 0.1–0.9)
Monocytes: 6 %
Neutrophils Absolute: 4.4 10*3/uL (ref 1.4–7.0)
Neutrophils: 63 %
Platelets: 246 10*3/uL (ref 150–450)
RBC: 3.85 x10E6/uL (ref 3.77–5.28)
RDW: 11.8 % (ref 11.7–15.4)
RPR Ser Ql: NONREACTIVE
Rh Factor: POSITIVE
Rubella Antibodies, IGG: 2.48 index (ref >0.99)
WBC: 6.9 10*3/uL (ref 3.4–10.8)

## 2020-10-21 LAB — HCV INTERPRETATION

## 2020-10-21 LAB — INTEGRATED 1
Crown Rump Length: 79.9 mm
Gest. Age on Collection Date: 13.7 weeks
Maternal Age at EDD: 33.3 yr
Nuchal Translucency (NT): 1.4 mm
Number of Fetuses: 1
PAPP-A Value: 993.7 ng/mL
Weight: 336 [lb_av]

## 2020-10-22 ENCOUNTER — Encounter: Payer: Self-pay | Admitting: Women's Health

## 2020-10-22 DIAGNOSIS — O24410 Gestational diabetes mellitus in pregnancy, diet controlled: Secondary | ICD-10-CM | POA: Insufficient documentation

## 2020-10-22 DIAGNOSIS — O24419 Gestational diabetes mellitus in pregnancy, unspecified control: Secondary | ICD-10-CM | POA: Insufficient documentation

## 2020-10-22 DIAGNOSIS — Z8632 Personal history of gestational diabetes: Secondary | ICD-10-CM | POA: Insufficient documentation

## 2020-10-22 LAB — GLUCOSE TOLERANCE, 2 HOURS W/ 1HR
Glucose, 1 hour: 227 mg/dL — ABNORMAL HIGH (ref 65–179)
Glucose, 2 hour: 182 mg/dL — ABNORMAL HIGH (ref 65–152)
Glucose, Fasting: 110 mg/dL — ABNORMAL HIGH (ref 65–91)

## 2020-10-29 ENCOUNTER — Encounter: Payer: Self-pay | Admitting: Advanced Practice Midwife

## 2020-11-11 ENCOUNTER — Ambulatory Visit (INDEPENDENT_AMBULATORY_CARE_PROVIDER_SITE_OTHER): Payer: Medicaid Other | Admitting: Advanced Practice Midwife

## 2020-11-11 ENCOUNTER — Other Ambulatory Visit: Payer: Self-pay

## 2020-11-11 ENCOUNTER — Other Ambulatory Visit: Payer: Self-pay | Admitting: Advanced Practice Midwife

## 2020-11-11 VITALS — BP 125/77 | HR 85 | Wt 337.2 lb

## 2020-11-11 DIAGNOSIS — Z3A17 17 weeks gestation of pregnancy: Secondary | ICD-10-CM

## 2020-11-11 DIAGNOSIS — O24410 Gestational diabetes mellitus in pregnancy, diet controlled: Secondary | ICD-10-CM

## 2020-11-11 DIAGNOSIS — R8271 Bacteriuria: Secondary | ICD-10-CM

## 2020-11-11 DIAGNOSIS — Z1379 Encounter for other screening for genetic and chromosomal anomalies: Secondary | ICD-10-CM

## 2020-11-11 DIAGNOSIS — Z363 Encounter for antenatal screening for malformations: Secondary | ICD-10-CM

## 2020-11-11 DIAGNOSIS — O0990 Supervision of high risk pregnancy, unspecified, unspecified trimester: Secondary | ICD-10-CM

## 2020-11-11 DIAGNOSIS — Z3480 Encounter for supervision of other normal pregnancy, unspecified trimester: Secondary | ICD-10-CM

## 2020-11-11 DIAGNOSIS — Z113 Encounter for screening for infections with a predominantly sexual mode of transmission: Secondary | ICD-10-CM

## 2020-11-11 MED ORDER — ACCU-CHEK SOFTCLIX LANCETS MISC
12 refills | Status: DC
Start: 2020-11-11 — End: 2020-11-11

## 2020-11-11 MED ORDER — GLUCOSE BLOOD VI STRP: ORAL_STRIP | 12 refills | Status: DC

## 2020-11-11 MED ORDER — ACCU-CHEK GUIDE ME W/DEVICE KIT: 1.0000 | PACK | Freq: Four times a day (QID) | 0 refills | Status: DC

## 2020-11-11 NOTE — Patient Instructions (Signed)
Brooke [name], thank you for choosing our office today! We appreciate the opportunity to meet your healthcare needs. You may receive a short survey by mail, e-mail, or through [employer]. If you are happy with your care we would appreciate if you could take just a few minutes to complete the survey questions. We read all of your comments and take your feedback very seriously. Thank you again for choosing our office.  Center for [employer] Team at [facility] at [facility] ([address] [place], [place] [address]) Entrance C, located off of [address] Free 24/7 valet parking  Go to [email or web address] to register for FREE online childbirth classes  Call the office [phone]) or go to [facility] if: You begin to severe cramping Your water breaks.  Sometimes it is a big gush of fluid, sometimes it is just a trickle that keeps getting your panties wet or running down your legs You have vaginal bleeding.  It is normal to have a small amount of spotting if your cervix was checked.   [facility] Pediatricians/Family Doctors [facility]): [address] Colette Ribas, [phone]           [facility]: [address], [phone]                [facility]): [address], [phone] (call to ask if accepting patients) [facility]: [address], [place], [phone]    [facility] Pediatricians/Family Doctors [facility]): [phone], [phone] [facility]: [address] [place], [phone] [facility]: [address], [phone]  [facility] Doctors  [facility]): [phone] [facility]: [address], [phone]   [facility] Doctors [facility]: [address] [phone]  [facility] Doctors  [facility]: [phone], [phone]  Home Blood Pressure Monitoring for Patients   Your provider has recommended that you check your blood pressure (BP) at least once a week at home. If you do not have a blood pressure cuff at home, one will be provided for you. Contact your provider if you have not received your monitor within 1 week.   Helpful Tips for Accurate Home Blood Pressure Checks  Don't smoke, exercise, or drink caffeine 30 minutes before checking your BP Use the restroom before checking your BP (a full bladder can raise your pressure) Relax in a comfortable upright chair Feet on the ground Left arm resting comfortably on a flat surface at the level of your heart Legs uncrossed Back supported Sit quietly and don't talk Place the cuff on your bare arm Adjust snuggly, so that only two fingertips can fit between your skin and the top of the cuff Check 2 readings separated by at least one minute Keep a log of your BP readings For a visual, please reference this diagram: http://ccnc.care/bpdiagram  Provider Name: [facility]     Phone: [phone]  Zone 1: ALL CLEAR  Continue to monitor your symptoms:  BP reading is less than 140 (top number) or less than 90 (bottom number)  No right upper stomach pain No headaches or seeing spots No feeling nauseated or throwing up No swelling in face and hands  Zone 2: CAUTION Call your doctor's office for any of the following:  BP reading is greater than 140 (top number) or greater than  90 (bottom number)  Stomach pain under your ribs in the middle or right side Headaches or seeing spots Feeling nauseated or throwing up Swelling in face and hands  Zone 3: EMERGENCY  Seek immediate medical care if you have any of the following:  BP reading is greater than160 (top number) or greater than 110 (bottom number) Severe headaches not improving with Tylenol Serious difficulty catching your breath Any worsening symptoms from  Zone 2     Second Trimester of Pregnancy The second trimester is from week 14 through week 27 (months 4 through 6). The second trimester is often a time when you feel your best. Your body has adjusted to being pregnant, and you begin to feel better physically. Usually, morning sickness has lessened or quit completely, you may have more energy, and you may have an increase in appetite. The second trimester is also a time when the fetus is growing rapidly. At the end of the sixth month, the fetus is about 9 inches long and weighs about 1 pounds. You will likely begin to feel the baby move (quickening) between 16 and 20 weeks of pregnancy. Body changes during your second trimester Your body continues to go through many changes during your second trimester. The changes vary from woman to woman. Your weight will continue to increase. You will notice your lower abdomen bulging out. You may begin to get stretch marks on your hips, abdomen, and breasts. You may develop headaches that can be relieved by medicines. The medicines should be approved by your health care provider. You may urinate more often because the fetus is pressing on your bladder. You may develop or continue to have heartburn as a result of your pregnancy. You may develop constipation because certain hormones are causing the muscles that push waste through your intestines to slow down. You may develop hemorrhoids or swollen, bulging veins (varicose veins). You may have back pain. This is caused by: Weight gain. Pregnancy hormones that are relaxing the joints in your pelvis. A shift in weight and the muscles that support your balance. Your breasts will continue to grow and they will continue to become tender. Your gums may bleed and may be sensitive to brushing and flossing. Dark spots or blotches (chloasma, mask of pregnancy) may develop on your face. This will likely fade after the baby is born. A dark line from your belly button to  the pubic area (linea nigra) may appear. This will likely fade after the baby is born. You may have changes in your hair. These can include thickening of your hair, rapid growth, and changes in texture. Some women also have hair loss during or after pregnancy, or hair that feels dry or thin. Your hair will most likely return to normal after your baby is born.  What to expect at prenatal visits During a routine prenatal visit: You will be weighed to make sure you and the fetus are growing normally. Your blood pressure will be taken. Your abdomen will be measured to track your baby's growth. The fetal heartbeat will be listened to. Any test results from the previous visit will be discussed.  Your health care provider may ask you: How you are feeling. If you are feeling the baby move. If you have had any abnormal symptoms, such as leaking fluid, bleeding, severe headaches, or abdominal cramping. If you are using any tobacco products, including cigarettes, chewing tobacco, and electronic cigarettes. If you have any questions.  Other tests that may be performed during   your second trimester include: Blood tests that check for: Low iron levels (anemia). High blood sugar that affects pregnant women (gestational diabetes) between 24 and 28 weeks. Rh antibodies. This is to check for a protein on red blood cells (Rh factor). Urine tests to check for infections, diabetes, or protein in the urine. An ultrasound to confirm the proper growth and development of the baby. An amniocentesis to check for possible genetic problems. Fetal screens for spina bifida and Down syndrome. HIV (human immunodeficiency virus) testing. Routine prenatal testing includes screening for HIV, unless you choose not to have this test.  Follow these instructions at home: Medicines Follow your health care provider's instructions regarding medicine use. Specific medicines may be either safe or unsafe to take during  pregnancy. Take a prenatal vitamin that contains at least 600 micrograms (mcg) of folic acid. If you develop constipation, try taking a stool softener if your health care provider approves. Eating and drinking Eat a balanced diet that includes fresh fruits and vegetables, whole grains, good sources of protein such as meat, eggs, or tofu, and low-fat dairy. Your health care provider will help you determine the amount of weight gain that is right for you. Avoid raw meat and uncooked cheese. These carry germs that can cause birth defects in the baby. If you have low calcium intake from food, talk to your health care provider about whether you should take a daily calcium supplement. Limit foods that are high in fat and processed sugars, such as fried and sweet foods. To prevent constipation: Drink enough fluid to keep your urine clear or pale yellow. Eat foods that are high in fiber, such as fresh fruits and vegetables, whole grains, and beans. Activity Exercise only as directed by your health care provider. Most women can continue their usual exercise routine during pregnancy. Try to exercise for 30 minutes at least 5 days a week. Stop exercising if you experience uterine contractions. Avoid heavy lifting, wear low heel shoes, and practice good posture. A sexual relationship may be continued unless your health care provider directs you otherwise. Relieving pain and discomfort Wear a good support bra to prevent discomfort from breast tenderness. Take warm sitz baths to soothe any pain or discomfort caused by hemorrhoids. Use hemorrhoid cream if your health care provider approves. Rest with your legs elevated if you have leg cramps or low back pain. If you develop varicose veins, wear support hose. Elevate your feet for 15 minutes, 3-4 times a day. Limit salt in your diet. Prenatal Care Write down your questions. Take them to your prenatal visits. Keep all your prenatal visits as told by your health  care provider. This is important. Safety Wear your seat belt at all times when driving. Make a list of emergency phone numbers, including numbers for family, friends, the hospital, and police and fire departments. General instructions Ask your health care provider for a referral to a local prenatal education class. Begin classes no later than the beginning of month 6 of your pregnancy. Ask for help if you have counseling or nutritional needs during pregnancy. Your health care provider can offer advice or refer you to specialists for help with various needs. Do not use hot tubs, steam rooms, or saunas. Do not douche or use tampons or scented sanitary pads. Do not cross your legs for long periods of time. Avoid cat litter boxes and soil used by cats. These carry germs that can cause birth defects in the baby and possibly loss of the   fetus by miscarriage or stillbirth. Avoid all smoking, herbs, alcohol, and unprescribed drugs. Chemicals in these products can affect the formation and growth of the baby. Do not use any products that contain nicotine or tobacco, such as cigarettes and e-cigarettes. If you need help quitting, ask your health care provider. Visit your dentist if you have not gone yet during your pregnancy. Use a soft toothbrush to brush your teeth and be gentle when you floss. Contact a health care provider if: You have dizziness. You have mild pelvic cramps, pelvic pressure, or nagging pain in the abdominal area. You have persistent nausea, vomiting, or diarrhea. You have a bad smelling vaginal discharge. You have pain when you urinate. Get help right away if: You have a fever. You are leaking fluid from your vagina. You have spotting or bleeding from your vagina. You have severe abdominal cramping or pain. You have rapid weight gain or weight loss. You have shortness of breath with chest pain. You notice sudden or extreme swelling of your face, hands, ankles, feet, or legs. You  have not felt your baby move in over an hour. You have severe headaches that do not go away when you take medicine. You have vision changes. Summary The second trimester is from week 14 through week 27 (months 4 through 6). It is also a time when the fetus is growing rapidly. Your body goes through many changes during pregnancy. The changes vary from woman to woman. Avoid all smoking, herbs, alcohol, and unprescribed drugs. These chemicals affect the formation and growth your baby. Do not use any tobacco products, such as cigarettes, chewing tobacco, and e-cigarettes. If you need help quitting, ask your health care provider. Contact your health care provider if you have any questions. Keep all prenatal visits as told by your health care provider. This is important. This information is not intended to replace advice given to you by your health care provider. Make sure you discuss any questions you have with your health care provider. Document Released: 02/01/2001 Document Revised: 07/16/2015 Document Reviewed: 04/10/2012 Elsevier Interactive Patient Education  2017 [employer] Inc.  

## 2020-11-11 NOTE — Progress Notes (Signed)
Orders placed for diabetic supplies and referral

## 2020-11-11 NOTE — Progress Notes (Signed)
HIGH-RISK PREGNANCY VISIT Patient name: Brooke [name] MRN [id number]  Date of birth: Apr 23, 1985 Chief Complaint:   Routine Prenatal Visit (2nd IT)  History of Present Illness:   Brooke [name] is a 35 y.o. G73P0030 female at [redacted]w[redacted]d with an Estimated Date of Delivery: 04/17/21 being seen today for ongoing management of a high-risk pregnancy complicated by diabetes mellitus A1/BDM.    Today she reports no complaints.  . Vag. Bleeding: None.  Movement: Present. denies leaking of fluid.   Depression screen [facility] 2/9 10/14/2020 10/01/2019 04/12/2018 04/12/2018 04/22/2014  Decreased Interest 1 0 1 1 -  Down, Depressed, Hopeless 0 0 2 2 0  PHQ - 2 Score 1 0 3 3 0  Altered sleeping 0 1 2 - -  Tired, decreased energy 1 3 2  - -  Change in appetite 0 0 3 - -  Feeling bad or failure about yourself  0 0 2 - -  Trouble concentrating 0 0 1 - -  Moving slowly or fidgety/restless 0 0 0 - -  Suicidal thoughts 0 0 0 - -  PHQ-9 Score 2 4 13  - -  Difficult doing work/chores - Not difficult at all Somewhat difficult - -  Some recent data might be hidden     GAD 7 : Generalized Anxiety Score 10/14/2020 10/01/2019  Nervous, Anxious, on Edge 0 0  Control/stop worrying 0 1  Worry too much - different things 1 1  Trouble relaxing 0 0  Restless 0 0  Easily annoyed or irritable 1 0  Afraid - awful might happen 0 0  Total GAD 7 Score 2 2  Anxiety Difficulty - Not difficult at all     Review of Systems:   Pertinent items are noted in HPI Denies abnormal vaginal discharge w/ itching/odor/irritation, headaches, visual changes, shortness of breath, chest pain, abdominal pain, severe nausea/vomiting, or problems with urination or bowel movements unless otherwise stated above. Pertinent History Reviewed:  Reviewed past medical,surgical, social, obstetrical and family history.  Reviewed problem list, medications and allergies. Physical Assessment:   Vitals:   11/11/20 1108  BP: 125/77  Pulse: 85  Weight:  (!) 337 lb 3.2 oz (153 kg)  Body mass index is 57.88 kg/m.           Physical Examination:   General appearance: alert, well appearing, and in no distress  Mental status: alert, oriented to person, place, and time  Skin: warm & dry   Extremities: Edema: None    Cardiovascular: normal heart rate noted  Respiratory: normal respiratory effort, no distress  Abdomen: gravid, soft, non-tender  Pelvic: Cervical exam deferred         Fetal Status: Fetal Heart Rate (bpm): 144   Movement: Present    Fetal Surveillance Testing today: doppler    No results found for this or any previous visit (from the past 24 hour(s)).  Assessment & Plan:  High-risk pregnancy: G4P0030 at [redacted]w[redacted]d with an Estimated Date of Delivery: 04/17/21   1) A1/B DM, glitch in message routing for diabetic education and meter> [redacted]w[redacted]d will take care of those items today; for now, pt instructed in eliminating sugars and instructed to check qid CBGs and record in log to bring each visit  2) ASB, TOC today  Meds: No orders of the defined types were placed in this encounter.   Labs/procedures today: GC/CT, urine culture, and 2nd IT  Treatment Plan:  anatomy scan with next visit; diabetic ed to be scheduled; will need growth  q 4wks and 2x/wk testing starting at 32wks (if meds needed)  Reviewed: Preterm labor symptoms and general obstetric precautions including but not limited to vaginal bleeding, contractions, leaking of fluid and fetal movement were reviewed in detail with the patient.  All questions were answered. Does have home bp cuff. Office bp cuff given: not applicable. Check bp weekly, let us know if consistently >140 and/or >90.  Follow-up: Return for 2-3wks; HROB, anatomy u/s.   Future Appointments  Date Time Provider Department Center  02/04/2021  2:30 PM Sater, Pearletha Furl, MD [facility] None    Orders Placed This Encounter  Procedures   Urine Culture   GC/Chlamydia Probe Amp   US OB Comp + 14 Wk   INTEGRATED 2    Arabella Merles [facility] 11/11/2020 11:43 AM

## 2020-11-12 ENCOUNTER — Encounter: Payer: Self-pay | Admitting: Advanced Practice Midwife

## 2020-11-14 LAB — URINE CULTURE

## 2020-11-15 LAB — GC/CHLAMYDIA PROBE AMP
Chlamydia trachomatis, NAA: NEGATIVE
Neisseria Gonorrhoeae by PCR: NEGATIVE

## 2020-11-17 LAB — INTEGRATED 2
AFP MoM: 1.29
Alpha-Fetoprotein: 26.3 ng/mL
Crown Rump Length: 79.9 mm
DIA MoM: 1.22
DIA Value: 124.8 pg/mL
Estriol, Unconjugated: 0.98 ng/mL
Gest. Age on Collection Date: 13.7 weeks
Gestational Age: 17.7 weeks
Maternal Age at EDD: 33.3 yr
Nuchal Translucency (NT): 1.4 mm
Nuchal Translucency MoM: 0.74
Number of Fetuses: 1
PAPP-A MoM: 1.97
PAPP-A Value: 993.7 ng/mL
Test Results:: NEGATIVE
Weight: 336 [lb_av]
Weight: 336 [lb_av]
hCG MoM: 1.88
hCG Value: 26 IU/mL
uE3 MoM: 0.96

## 2020-11-24 ENCOUNTER — Telehealth: Payer: Self-pay | Admitting: Women's Health

## 2020-11-24 NOTE — Telephone Encounter (Signed)
Pt is [redacted]w[redacted]d & has not had a bowel movement in 3 days, wonders what she can take? Suggestions?   Please advise & notify pt   [facility]

## 2020-12-02 ENCOUNTER — Ambulatory Visit (INDEPENDENT_AMBULATORY_CARE_PROVIDER_SITE_OTHER): Payer: Medicaid Other

## 2020-12-02 ENCOUNTER — Encounter: Payer: Medicaid Other | Admitting: Women's Health

## 2020-12-02 ENCOUNTER — Ambulatory Visit (INDEPENDENT_AMBULATORY_CARE_PROVIDER_SITE_OTHER): Payer: Medicaid Other | Admitting: Women's Health

## 2020-12-02 ENCOUNTER — Encounter: Payer: Self-pay | Admitting: Women's Health

## 2020-12-02 ENCOUNTER — Other Ambulatory Visit: Payer: Self-pay

## 2020-12-02 VITALS — BP 128/79 | HR 83 | Wt 339.0 lb

## 2020-12-02 DIAGNOSIS — Z363 Encounter for antenatal screening for malformations: Secondary | ICD-10-CM

## 2020-12-02 DIAGNOSIS — O24419 Gestational diabetes mellitus in pregnancy, unspecified control: Secondary | ICD-10-CM

## 2020-12-02 DIAGNOSIS — Z3A20 20 weeks gestation of pregnancy: Secondary | ICD-10-CM

## 2020-12-02 DIAGNOSIS — O09892 Supervision of other high risk pregnancies, second trimester: Secondary | ICD-10-CM

## 2020-12-02 LAB — POCT URINALYSIS DIPSTICK OB
Blood, UA: NEGATIVE
Glucose, UA: NEGATIVE
Ketones, UA: NEGATIVE
Leukocytes, UA: NEGATIVE
Nitrite, UA: NEGATIVE
POC,PROTEIN,UA: NEGATIVE

## 2020-12-02 MED ORDER — METFORMIN HCL 500 MG PO TABS: 500.0000 mg | ORAL_TABLET | Freq: Two times a day (BID) | ORAL | 3 refills | Status: DC

## 2020-12-02 NOTE — Patient Instructions (Signed)
Kaleisha, thank you for choosing our office today! We appreciate the opportunity to meet your healthcare needs. You may receive a short survey by mail, e-mail, or through MyChart. If you are happy with your care we would appreciate if you could take just a few minutes to complete the survey questions. We read all of your comments and take your feedback very seriously. Thank you again for choosing our office.  [facility] Team at [facility] ([address] [place], [place] [address]) Entrance C, located off of [address] Free 24/7 valet parking  Go to [email or web address] to register for FREE online childbirth classes  Call the office ([phone]) or go to [facility] if: You begin to severe cramping Your water breaks.  Sometimes it is a big gush of fluid, sometimes it is just a trickle that keeps getting your panties wet or running down your legs You have vaginal bleeding.  It is normal to have a small amount of spotting if your cervix was checked.   [place] Pediatricians/Family Doctors [facility] ([facility]): [address], [phone]           [facility]: [address], [phone]                [facility] ([facility]): [address], [phone] (call to ask if accepting patients) [facility]: [address], [place], [phone]    [place] Pediatricians/Family Doctors [facility] ([facility]): [address], [phone] [facility]: [address], [phone] [facility]: [address], [phone]  [place] [facility]  [facility] ([facility]): [phone] [facility]: [address], [phone]   [place] Family Doctors [facility]: [address], [phone]  [place] [facility]  [facility]: [address], [phone]  Home Blood Pressure Monitoring for Patients   Your provider has recommended that you check your blood pressure (BP) at least once a week at home. If you do not have a blood pressure cuff at home, one will be provided for you. Contact your provider if you have not received your monitor within 1 week.   Helpful Tips for Accurate Home Blood Pressure Checks  Don't smoke, exercise, or drink caffeine 30 minutes before checking your BP Use the restroom before checking your BP (a full bladder can raise your pressure) Relax in a comfortable upright chair Feet on the ground Left arm resting comfortably on a flat surface at the level of your heart Legs uncrossed Back supported Sit quietly and don't talk Place the cuff on your bare arm Adjust snuggly, so that only two fingertips can fit between your skin and the top of the cuff Check 2 readings separated by at least one minute Keep a log of your BP readings For a visual, please reference this diagram: http://ccnc.care/bpdiagram  Provider Name: [facility]     Phone: [phone]  Zone 1: ALL CLEAR  Continue to monitor your symptoms:  BP reading is less than 140 (top number) or less than 90 (bottom number)  No right upper stomach pain No headaches or seeing spots No feeling nauseated or throwing up No swelling in face and hands  Zone 2: CAUTION Call your doctor's office for any of the following:  BP reading is greater than 140 (top number) or greater than   90 (bottom number)  Stomach pain under your ribs in the middle or right side Headaches or seeing spots Feeling nauseated or throwing up Swelling in face and hands  Zone 3: EMERGENCY  Seek immediate medical care if you have any of the following:  BP reading is greater than160 (top number) or greater than 110 (bottom number) Severe headaches not improving with Tylenol Serious difficulty catching your breath Any worsening symptoms from  Zone 2     Second Trimester of Pregnancy The second trimester is from week 14 through week 27 (months 4 through 6). The second trimester is often a time when you feel your best. Your body has adjusted to being pregnant, and you begin to feel better physically. Usually, morning sickness has lessened or quit completely, you may have more energy, and you may have an increase in appetite. The second trimester is also a time when the fetus is growing rapidly. At the end of the sixth month, the fetus is about 9 inches long and weighs about 1 pounds. You will likely begin to feel the baby move (quickening) between 16 and 20 weeks of pregnancy. Body changes during your second trimester Your body continues to go through many changes during your second trimester. The changes vary from woman to woman. Your weight will continue to increase. You will notice your lower abdomen bulging out. You may begin to get stretch marks on your hips, abdomen, and breasts. You may develop headaches that can be relieved by medicines. The medicines should be approved by your health care provider. You may urinate more often because the fetus is pressing on your bladder. You may develop or continue to have heartburn as a result of your pregnancy. You may develop constipation because certain hormones are causing the muscles that push waste through your intestines to slow down. You may develop hemorrhoids or swollen, bulging veins (varicose veins). You may have back pain. This is caused by: Weight gain. Pregnancy hormones that are relaxing the joints in your pelvis. A shift in weight and the muscles that support your balance. Your breasts will continue to grow and they will continue to become tender. Your gums may bleed and may be sensitive to brushing and flossing. Dark spots or blotches (chloasma, mask of pregnancy) may develop on your face. This will likely fade after the baby is born. A dark line from your belly button to  the pubic area (linea nigra) may appear. This will likely fade after the baby is born. You may have changes in your hair. These can include thickening of your hair, rapid growth, and changes in texture. Some women also have hair loss during or after pregnancy, or hair that feels dry or thin. Your hair will most likely return to normal after your baby is born.  What to expect at prenatal visits During a routine prenatal visit: You will be weighed to make sure you and the fetus are growing normally. Your blood pressure will be taken. Your abdomen will be measured to track your baby's growth. The fetal heartbeat will be listened to. Any test results from the previous visit will be discussed.  Your health care provider may ask you: How you are feeling. If you are feeling the baby move. If you have had any abnormal symptoms, such as leaking fluid, bleeding, severe headaches, or abdominal cramping. If you are using any tobacco products, including cigarettes, chewing tobacco, and electronic cigarettes. If you have any questions.  Other tests that may be performed during   your second trimester include: Blood tests that check for: Low iron levels (anemia). High blood sugar that affects pregnant women (gestational diabetes) between 24 and 28 weeks. Rh antibodies. This is to check for a protein on red blood cells (Rh factor). Urine tests to check for infections, diabetes, or protein in the urine. An ultrasound to confirm the proper growth and development of the baby. An amniocentesis to check for possible genetic problems. Fetal screens for spina bifida and Down syndrome. HIV (human immunodeficiency virus) testing. Routine prenatal testing includes screening for HIV, unless you choose not to have this test.  Follow these instructions at home: Medicines Follow your health care provider's instructions regarding medicine use. Specific medicines may be either safe or unsafe to take during  pregnancy. Take a prenatal vitamin that contains at least 600 micrograms (mcg) of folic acid. If you develop constipation, try taking a stool softener if your health care provider approves. Eating and drinking Eat a balanced diet that includes fresh fruits and vegetables, whole grains, good sources of protein such as meat, eggs, or tofu, and low-fat dairy. Your health care provider will help you determine the amount of weight gain that is right for you. Avoid raw meat and uncooked cheese. These carry germs that can cause birth defects in the baby. If you have low calcium intake from food, talk to your health care provider about whether you should take a daily calcium supplement. Limit foods that are high in fat and processed sugars, such as fried and sweet foods. To prevent constipation: Drink enough fluid to keep your urine clear or pale yellow. Eat foods that are high in fiber, such as fresh fruits and vegetables, whole grains, and beans. Activity Exercise only as directed by your health care provider. Most women can continue their usual exercise routine during pregnancy. Try to exercise for 30 minutes at least 5 days a week. Stop exercising if you experience uterine contractions. Avoid heavy lifting, wear low heel shoes, and practice good posture. A sexual relationship may be continued unless your health care provider directs you otherwise. Relieving pain and discomfort Wear a good support bra to prevent discomfort from breast tenderness. Take warm sitz baths to soothe any pain or discomfort caused by hemorrhoids. Use hemorrhoid cream if your health care provider approves. Rest with your legs elevated if you have leg cramps or low back pain. If you develop varicose veins, wear support hose. Elevate your feet for 15 minutes, 3-4 times a day. Limit salt in your diet. Prenatal Care Write down your questions. Take them to your prenatal visits. Keep all your prenatal visits as told by your health  care provider. This is important. Safety Wear your seat belt at all times when driving. Make a list of emergency phone numbers, including numbers for family, friends, the hospital, and police and fire departments. General instructions Ask your health care provider for a referral to a local prenatal education class. Begin classes no later than the beginning of month 6 of your pregnancy. Ask for help if you have counseling or nutritional needs during pregnancy. Your health care provider can offer advice or refer you to specialists for help with various needs. Do not use hot tubs, steam rooms, or saunas. Do not douche or use tampons or scented sanitary pads. Do not cross your legs for long periods of time. Avoid cat litter boxes and soil used by cats. These carry germs that can cause birth defects in the baby and possibly loss of the   fetus by miscarriage or stillbirth. Avoid all smoking, herbs, alcohol, and unprescribed drugs. Chemicals in these products can affect the formation and growth of the baby. Do not use any products that contain nicotine or tobacco, such as cigarettes and e-cigarettes. If you need help quitting, ask your health care provider. Visit your dentist if you have not gone yet during your pregnancy. Use a soft toothbrush to brush your teeth and be gentle when you floss. Contact a health care provider if: You have dizziness. You have mild pelvic cramps, pelvic pressure, or nagging pain in the abdominal area. You have persistent nausea, vomiting, or diarrhea. You have a bad smelling vaginal discharge. You have pain when you urinate. Get help right away if: You have a fever. You are leaking fluid from your vagina. You have spotting or bleeding from your vagina. You have severe abdominal cramping or pain. You have rapid weight gain or weight loss. You have shortness of breath with chest pain. You notice sudden or extreme swelling of your face, hands, ankles, feet, or legs. You  have not felt your baby move in over an hour. You have severe headaches that do not go away when you take medicine. You have vision changes. Summary The second trimester is from week 14 through week 27 (months 4 through 6). It is also a time when the fetus is growing rapidly. Your body goes through many changes during pregnancy. The changes vary from woman to woman. Avoid all smoking, herbs, alcohol, and unprescribed drugs. These chemicals affect the formation and growth your baby. Do not use any tobacco products, such as cigarettes, chewing tobacco, and e-cigarettes. If you need help quitting, ask your health care provider. Contact your health care provider if you have any questions. Keep all prenatal visits as told by your health care provider. This is important. This information is not intended to replace advice given to you by your health care provider. Make sure you discuss any questions you have with your health care provider. Document Released: 02/01/2001 Document Revised: 07/16/2015 Document Reviewed: 04/10/2012 Elsevier Interactive Patient Education  2017 Elsevier Inc.  

## 2020-12-02 NOTE — Progress Notes (Signed)
HIGH-RISK PREGNANCY VISIT Patient name: Brooke [name] MRN [id number]  Date of birth: 28-Aug-1985 Chief Complaint:   Routine Prenatal Visit  History of Present Illness:   Brooke [name] is a 35 y.o. 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 Has tried changing diet some. Drinks water, sodas, juice. FBS 90-115 (only 1 <95), 2hr pp 106-160 (majority >120) . Initial bp elevated, normal repeat. Reports normal bp's at home. . Vag. Bleeding: None.  Movement: Present. denies leaking of fluid.   Depression screen [facility] 2/9 10/14/2020 10/01/2019 04/12/2018 04/12/2018 04/22/2014  Decreased Interest 1 0 1 1 -  Down, Depressed, Hopeless 0 0 2 2 0  PHQ - 2 Score 1 0 3 3 0  Altered sleeping 0 1 2 - -  Tired, decreased energy 1 3 2  - -  Change in appetite 0 0 3 - -  Feeling bad or failure about yourself  0 0 2 - -  Trouble concentrating 0 0 1 - -  Moving slowly or fidgety/restless 0 0 0 - -  Suicidal thoughts 0 0 0 - -  PHQ-9 Score 2 4 13  - -  Difficult doing work/chores - Not difficult at all Somewhat difficult - -  Some recent data might be hidden     GAD 7 : Generalized Anxiety Score 10/14/2020 10/01/2019  Nervous, Anxious, on Edge 0 0  Control/stop worrying 0 1  Worry too much - different things 1 1  Trouble relaxing 0 0  Restless 0 0  Easily annoyed or irritable 1 0  Afraid - awful might happen 0 0  Total GAD 7 Score 2 2  Anxiety Difficulty - Not difficult at all     Review of Systems:   Pertinent items are noted in HPI Denies abnormal vaginal discharge w/ itching/odor/irritation, headaches, visual changes, shortness of breath, chest pain, abdominal pain, severe nausea/vomiting, or problems with urination or bowel movements unless otherwise stated above. Pertinent History Reviewed:  Reviewed past  medical,surgical, social, obstetrical and family history.  Reviewed problem list, medications and allergies. Physical Assessment:   Vitals:   12/02/20 1606 12/02/20 1616  BP: (!) 149/77 128/79  Pulse: 96 83  Weight: (!) 339 lb (153.8 kg)   Body mass index is 58.19 kg/m.           Physical Examination:   General appearance: alert, well appearing, and in no distress  Mental status: alert, oriented to person, place, and time  Skin: warm & dry   Extremities: Edema: None    Cardiovascular: normal heart rate noted  Respiratory: normal respiratory effort, no distress  Abdomen: gravid, soft, non-tender  Pelvic: Cervical exam deferred         Fetal Status: Fetal Heart Rate (bpm): 144 u/s   Movement: Present    Fetal Surveillance Testing today: 02/01/21 20+4 wks,cephalic,fhr 144 bpm,cx length 3.8 cm,anterior placenta gr 0,normal ovaries,SVP of fluid 6 cm,EFW 391 g 67%,anatomy complete,no obvious abnormalities   Chaperone: N/A    Results for orders placed or performed in visit on 12/02/20 (from the past 24 hour(s))  POC Urinalysis Dipstick OB   Collection Time: 12/02/20  4:18 PM  Result Value Ref Range   Color, UA     Clarity, UA     Glucose, UA Negative Negative   Bilirubin, UA  Ketones, UA neg    Spec Grav, UA     Blood, UA neg    pH, UA     POC,PROTEIN,UA Negative Negative, Trace, Small (1+), Moderate (2+), Large (3+), 4+   Urobilinogen, UA     Nitrite, UA neg    Leukocytes, UA Negative Negative   Appearance     Odor      Assessment & Plan:  High-risk pregnancy: G4P0030 at [redacted]w[redacted]d with an Estimated Date of Delivery: 04/17/21   1) A2/BDM dx @ 14wks, unstable, rx metformin 500mg  BID, cut back carbs, increase protein, cut out sodas/juice, increase activity. Discussed importance of strict glycemic control and adherence to low carb diet during pregnancy as well as potential complications from uncontrolled diabetes during pregnancy.  Keep appt w/ dietician on 10/26  2) Initially  elevated bp> normal repeat, no hx, continue ASA, check bp weekly at home, let 11/26 know if >140/90  Meds:  Meds ordered this encounter  Medications   metFORMIN (GLUCOPHAGE) 500 MG tablet    Sig: Take 1 tablet (500 mg total) by mouth 2 (two) times daily with a meal.    Dispense:  60 tablet    Refill:  3    Order Specific Question:   Supervising Provider    Answer:   [place] [[id number]]     Labs/procedures today: U/S  Treatment Plan:  Growth u/s q4wks       2x/wk testing or weekly BPP @ 32wks    Deliver @ 39wks:_____   Reviewed: Preterm labor symptoms and general obstetric precautions including but not limited to vaginal bleeding, contractions, leaking of fluid and fetal movement were reviewed in detail with the patient.  All questions were answered. Does have home bp cuff. Office bp cuff given: not applicable. Check bp weekly, let Lazaro Arms know if consistently >140 and/or >90.  Follow-up: Return in about 2 weeks (around 12/16/2020) for HROB, MD or CNM, in person; then 4wks from now for HROB MD/CNM and efw u/s.   Future Appointments  Date Time Provider Department Center  12/16/2020 11:50 AM 12/18/2020, DO [facility]  12/16/2020  2:45 PM [facility] CLASS [facility] NDM  12/31/2020  1:30 PM [facility] 13/11/2020 [facility] None  12/31/2020  2:10 PM 13/11/2020, CNM [facility]  02/04/2021  2:30 PM Sater, 02/06/2021, MD [facility] None    Orders Placed This Encounter  Procedures   POC Urinalysis Dipstick OB   Pearletha Furl CNM, [facility] 12/02/2020 5:02 PM

## 2020-12-02 NOTE — Progress Notes (Signed)
[place] 20+4 wks,cephalic,fhr 144 bpm,cx length 3.8 cm,anterior placenta gr 0,normal ovaries,SVP of fluid 6 cm,EFW 391 g 67%,anatomy complete,no obvious abnormalities

## 2020-12-04 ENCOUNTER — Other Ambulatory Visit: Payer: Self-pay | Admitting: Advanced Practice Midwife

## 2020-12-15 ENCOUNTER — Other Ambulatory Visit: Payer: Self-pay

## 2020-12-15 ENCOUNTER — Encounter: Payer: Self-pay | Admitting: Obstetrics & Gynecology

## 2020-12-15 ENCOUNTER — Ambulatory Visit (INDEPENDENT_AMBULATORY_CARE_PROVIDER_SITE_OTHER): Payer: Medicaid Other | Admitting: Obstetrics & Gynecology

## 2020-12-15 VITALS — BP 139/79 | HR 90 | Wt 341.6 lb

## 2020-12-15 DIAGNOSIS — O24415 Gestational diabetes mellitus in pregnancy, controlled by oral hypoglycemic drugs: Secondary | ICD-10-CM

## 2020-12-15 DIAGNOSIS — O0992 Supervision of high risk pregnancy, unspecified, second trimester: Secondary | ICD-10-CM

## 2020-12-15 LAB — POCT URINALYSIS DIPSTICK OB
Blood, UA: NEGATIVE
Glucose, UA: NEGATIVE
Ketones, UA: NEGATIVE
Leukocytes, UA: NEGATIVE
Nitrite, UA: NEGATIVE
POC,PROTEIN,UA: NEGATIVE

## 2020-12-15 NOTE — Progress Notes (Signed)
HIGH-RISK PREGNANCY VISIT Patient name: Brooke [name] MRN [id number]  Date of birth: September 15, 1985 Chief Complaint:   Routine Prenatal Visit and High Risk Gestation  History of Present Illness:   Brooke [name] is a 35 y.o. G43P0030 female at 35w3dwith an Estimated Date of Delivery: 04/17/21 being seen today for ongoing management of a high-risk pregnancy complicated by:  -MS- no meds currently Concerned about restarting meds PP and breastfeeding, interested in donor milk  -DM- A1/B- started on metformin 5072mbid at last visit Appt with dietician 10/26.    Today she reports no complaints.   Contractions: Not present. Vag. Bleeding: None.  Movement: Present. denies leaking of fluid.   [facility] 10/14/2020 10/01/2019 04/12/2018 04/12/2018 04/22/2014  Decreased Interest 1 0 1 1 -  Down, Depressed, Hopeless 0 0 2 2 0  PHQ - 2 Score 1 0 3 3 0  Altered sleeping 0 1 2 - -  Tired, decreased energy _0 - -  Change in appetite 0 0 3 - -  Feeling bad or failure about yourself  0 0 2 - -  Trouble concentrating 0 0 1 - -  Moving slowly or fidgety/restless 0 0 0 - -  Suicidal thoughts 0 0 0 - -  PHQ-9 Score _1 - -  Difficult doing work/chores - Not difficult at all Somewhat difficult - -  Some recent data might be hidden     Current Outpatient Medications  Medication Instructions   ACCU-CHEK GUIDE test strip CHECK SUGAR 4 TIMES DAILY   Accu-Chek Softclix Lancets lancets USE AS INSTRUCTED   Acetaminophen (TYLENOL PO) Take by mouth. As needed for knee pain.   aspirin 162 mg, Oral, Daily   Blood Glucose Monitoring Suppl (ACCU-CHEK GUIDE ME) w/Device KIT 1 each, Does not apply, 4 times daily   Blood Pressure Monitor MISC For regular home bp monitoring during pregnancy   metFORMIN (GLUCOPHAGE) 500 mg, Oral, 2 times daily with meals   Prenatal Vit-Fe Fumarate-FA (PNV PRENATAL PLUS MULTIVITAMIN) 27-1 MG TABS Take 1 daily     Review of Systems:   Pertinent items are  noted in HPI Denies abnormal vaginal discharge w/ itching/odor/irritation, headaches, visual changes, shortness of breath, chest pain, abdominal pain, severe nausea/vomiting, or problems with urination or bowel movements unless otherwise stated above. Pertinent History Reviewed:  Reviewed past medical,surgical, social, obstetrical and family history.  Reviewed problem list, medications and allergies. Physical Assessment:   Vitals:   12/15/20 1212  BP: 139/79  Pulse: 90  Weight: (!) 341 lb 9.6 oz (154.9 kg)  Body mass index is 58.64 kg/m.           Physical Examination:   General appearance: alert, well appearing, and in no distress  Mental status: alert, oriented to person, place, and time  Skin: warm & dry   Extremities: Edema: Trace    Cardiovascular: normal heart rate noted  Respiratory: normal respiratory effort, no distress  Abdomen: gravid, soft, non-tender  Pelvic: Cervical exam deferred         Fetal Status: Fetal Heart Rate (bpm): 150 Fundal Height: 24 cm - difficult to assess due to obesity Movement: Present    Fetal Surveillance Testing today: doppler   Chaperone: N/A    Results for orders placed or performed in visit on 12/15/20 (from the past 24 hour(s))  POC Urinalysis Dipstick OB   Collection Time: 12/15/20 12:38 PM  Result Value Ref Range   Color, UA  Clarity, UA     Glucose, UA Negative Negative   Bilirubin, UA     Ketones, UA neg    Spec Grav, UA     Blood, UA neg    pH, UA     POC,PROTEIN,UA Negative Negative, Trace, Small (1+), Moderate (2+), Large (3+), 4+   Urobilinogen, UA     Nitrite, UA neg    Leukocytes, UA Negative Negative   Appearance     Odor       Assessment & Plan:  High-risk pregnancy: G4P0030 at 4w3dwith an Estimated Date of Delivery: 04/17/21   1) GDMA2/B- doing well with metformin, sugars within normal range _0  BPP weekly @ 32wks _1  growth q 4wk  2) MS- no meds currently 3) BMI 57- will need growth scans instead of  fundal height measurements  Meds: No orders of the defined types were placed in this encounter.  Labs/procedures today: none, growth in 2wks and will continue q 4wks  Treatment Plan:  as outlined above  Reviewed: Preterm labor symptoms and general obstetric precautions including but not limited to vaginal bleeding, contractions, leaking of fluid and fetal movement were reviewed in detail with the patient.  All questions were answered.   Follow-up: Return in about 4 weeks (around 01/12/2021) for HROB visit, PN-2 (no glucose).   Future Appointments  Date Time Provider [place] 12/16/2020  2:45 PM [place] [place]  12/31/2020  1:30 PM [place] [place] None  12/31/2020  2:10 PM BRoma Schanz CNM [facility]  02/04/2021  2:30 PM Sater, RNanine Means MD [facility] None    Orders Placed This Encounter  Procedures   POC Urinalysis Dipstick OB    JJanyth Pupa DO Attending [facility] WDe[employer] COkay

## 2020-12-16 ENCOUNTER — Encounter: Payer: Medicaid Other | Admitting: Obstetrics & Gynecology

## 2020-12-16 ENCOUNTER — Encounter: Payer: Medicaid Other | Attending: Advanced Practice Midwife | Admitting: Registered"

## 2020-12-16 DIAGNOSIS — O24419 Gestational diabetes mellitus in pregnancy, unspecified control: Secondary | ICD-10-CM | POA: Diagnosis not present

## 2020-12-18 ENCOUNTER — Encounter: Payer: Self-pay | Admitting: Registered"

## 2020-12-18 NOTE — Progress Notes (Signed)
Patient was seen on 12/16/20 for Gestational Diabetes self-management class at [facility]. The following learning objectives were met by the patient during this course:  States the definition of Gestational Diabetes States why dietary management is important in controlling blood glucose Describes the effects each nutrient has on blood glucose levels Demonstrates ability to create a balanced meal plan Demonstrates carbohydrate counting  States when to check blood glucose levels Demonstrates proper blood glucose monitoring techniques States the effect of stress and exercise on blood glucose levels States the importance of limiting caffeine and abstaining from alcohol and smoking  Blood glucose monitor given: Patient has meter and is checking blood sugar prior to class   Patient instructed to monitor glucose levels: FBS: 60 - <95; 1 hour: <140; 2 hour: <120  Patient received handouts: Nutrition Diabetes and Pregnancy, including carb counting list  Patient will be seen for follow-up as needed.

## 2020-12-30 ENCOUNTER — Other Ambulatory Visit: Payer: Self-pay | Admitting: Obstetrics & Gynecology

## 2020-12-30 DIAGNOSIS — O24410 Gestational diabetes mellitus in pregnancy, diet controlled: Secondary | ICD-10-CM

## 2020-12-31 ENCOUNTER — Ambulatory Visit (INDEPENDENT_AMBULATORY_CARE_PROVIDER_SITE_OTHER): Payer: Medicaid Other

## 2020-12-31 ENCOUNTER — Other Ambulatory Visit: Payer: Self-pay

## 2020-12-31 ENCOUNTER — Ambulatory Visit (INDEPENDENT_AMBULATORY_CARE_PROVIDER_SITE_OTHER): Payer: Medicaid Other | Admitting: Women's Health

## 2020-12-31 ENCOUNTER — Encounter: Payer: Self-pay | Admitting: Women's Health

## 2020-12-31 VITALS — BP 136/69 | HR 96 | Wt 339.2 lb

## 2020-12-31 DIAGNOSIS — O99212 Obesity complicating pregnancy, second trimester: Secondary | ICD-10-CM | POA: Diagnosis not present

## 2020-12-31 DIAGNOSIS — F1290 Cannabis use, unspecified, uncomplicated: Secondary | ICD-10-CM

## 2020-12-31 DIAGNOSIS — O0990 Supervision of high risk pregnancy, unspecified, unspecified trimester: Secondary | ICD-10-CM

## 2020-12-31 DIAGNOSIS — R8271 Bacteriuria: Secondary | ICD-10-CM

## 2020-12-31 DIAGNOSIS — O24410 Gestational diabetes mellitus in pregnancy, diet controlled: Secondary | ICD-10-CM

## 2020-12-31 DIAGNOSIS — Z3A24 24 weeks gestation of pregnancy: Secondary | ICD-10-CM | POA: Diagnosis not present

## 2020-12-31 DIAGNOSIS — O24415 Gestational diabetes mellitus in pregnancy, controlled by oral hypoglycemic drugs: Secondary | ICD-10-CM

## 2020-12-31 DIAGNOSIS — O0992 Supervision of high risk pregnancy, unspecified, second trimester: Secondary | ICD-10-CM

## 2020-12-31 NOTE — Progress Notes (Signed)
[place] 24+5 wks,breech,anterior placenta gr 1,FHR 135 bpm,cx 3.1 cm,SVP of fluid 6.5 cm,EFW 787 g 64%

## 2020-12-31 NOTE — Patient Instructions (Signed)
Brooke [name], thank you for choosing our office today! We appreciate the opportunity to meet your healthcare needs. You may receive a short survey by mail, e-mail, or through [employer]. If you are happy with your care we would appreciate if you could take just a few minutes to complete the survey questions. We read all of your comments and take your feedback very seriously. Thank you again for choosing our office.  Center for [employer] Team at [facility]  [facility] at [facility] ([address] [place], [place] [address]) Entrance C, located off of [address]  CLASSES: Go to [email or web address] to register for classes (childbirth, breastfeeding, waterbirth, infant CPR, daddy bootcamp, etc.)  Call the office [phone]) or go to [facility] if: You begin to have strong, frequent contractions Your water breaks.  Sometimes it is a big gush of fluid, sometimes it is just a trickle that keeps getting your panties wet or running down your legs You have vaginal bleeding.  It is normal to have a small amount of spotting if your cervix was checked.  You don't feel your baby moving like normal.  If you don't, get you something to eat and drink and lay down and focus on feeling your baby move.   If your baby is still not moving like normal, you should call the office or go to [facility].  Call the office [phone]) or go to [facility] hospital for these signs of pre-eclampsia: Severe headache that does not go away with Tylenol Visual changes- seeing spots, double, blurred vision Pain under your right breast or upper abdomen that does not go away with Tums or heartburn medicine Nausea and/or vomiting Severe swelling in your hands, feet, and face    [facility] Pediatricians/Family Doctors [facility]): [address] Colette Ribas, [phone]           [facility]: [address], [phone]                [facility] Family  Medicine [facility]): [address], [phone] (call to ask if accepting patients) [facility]: [address], [place], [phone]    [facility] Pediatricians/Family Doctors [facility]): [address], [phone] [facility]: [address] [place], [phone] [facility]: [address], [phone]  [facility] Doctors  [facility]): [phone] [facility]: [address], [phone]   [facility] Doctors [facility]: [address], [phone]  [facility] Doctors  [facility]: [phone], [phone]  Home Blood Pressure Monitoring for Patients   Your provider has recommended that you check your blood pressure (BP) at least once a week at home. If you do not have a blood pressure cuff at home, one will be provided for you. Contact your provider if you have not received your monitor within 1 week.   Helpful Tips for Accurate Home Blood Pressure Checks  Don't smoke, exercise, or drink caffeine 30 minutes before checking your BP Use the restroom before checking your BP (a full bladder can raise your pressure) Relax in a comfortable upright chair Feet on the ground Left arm resting comfortably on a flat surface at the level of your heart Legs uncrossed Back supported Sit quietly and don't talk Place the cuff on your bare arm Adjust snuggly, so that only two fingertips can fit between your  skin and the top of the cuff Check 2 readings separated by at least one minute Keep a log of your BP readings For a visual, please reference this diagram: http://ccnc.care/bpdiagram  Provider Name: [facility]     Phone: [phone]  Zone 1: ALL CLEAR  Continue to monitor your symptoms:  BP reading is less than 140 (top number) or less than 90 (bottom number)  No right upper stomach  pain No headaches or seeing spots No feeling nauseated or throwing up No swelling in face and hands  Zone 2: CAUTION Call your doctor's office for any of the following:  BP reading is greater than 140 (top number) or greater than 90 (bottom number)  Stomach pain under your ribs in the middle or right side Headaches or seeing spots Feeling nauseated or throwing up Swelling in face and hands  Zone 3: EMERGENCY  Seek immediate medical care if you have any of the following:  BP reading is greater than160 (top number) or greater than 110 (bottom number) Severe headaches not improving with Tylenol Serious difficulty catching your breath Any worsening symptoms from Zone 2   Second Trimester of Pregnancy The second trimester is from week 13 through week 28, months 4 through 6. The second trimester is often a time when you feel your best. Your body has also adjusted to being pregnant, and you begin to feel better physically. Usually, morning sickness has lessened or quit completely, you may have more energy, and you may have an increase in appetite. The second trimester is also a time when the fetus is growing rapidly. At the end of the sixth month, the fetus is about 9 inches long and weighs about 1 pounds. You will likely begin to feel the baby move (quickening) between 18 and 20 weeks of the pregnancy. BODY CHANGES Your body goes through many changes during pregnancy. The changes vary from woman to woman.  Your weight will continue to increase. You will notice your lower abdomen bulging out. You may begin to get stretch marks on your hips, abdomen, and breasts. You may develop headaches that can be relieved by medicines approved by your health care provider. You may urinate more often because the fetus is pressing on your bladder. You may develop or continue to have heartburn as a result of your pregnancy. You may develop constipation because certain hormones are causing the muscles that push  waste through your intestines to slow down. You may develop hemorrhoids or swollen, bulging veins (varicose veins). You may have back pain because of the weight gain and pregnancy hormones relaxing your joints between the bones in your pelvis and as a result of a shift in weight and the muscles that support your balance. Your breasts will continue to grow and be tender. Your gums may bleed and may be sensitive to brushing and flossing. Dark spots or blotches (chloasma, mask of pregnancy) may develop on your face. This will likely fade after the baby is born. A dark line from your belly button to the pubic area (linea nigra) may appear. This will likely fade after the baby is born. You may have changes in your hair. These can include thickening of your hair, rapid growth, and changes in texture. Some women also have hair loss during or after pregnancy, or hair that feels dry or thin. Your hair will most likely return to normal after your baby is born. WHAT TO EXPECT AT YOUR PRENATAL VISITS During a routine prenatal visit: You will  be weighed to make sure you and the fetus are growing normally. Your blood pressure will be taken. Your abdomen will be measured to track your baby's growth. The fetal heartbeat will be listened to. Any test results from the previous visit will be discussed. Your health care provider may ask you: How you are feeling. If you are feeling the baby move. If you have had any abnormal symptoms, such as leaking fluid, bleeding, severe headaches, or abdominal cramping. If you have any questions. Other tests that may be performed during your second trimester include: Blood tests that check for: Low iron levels (anemia). Gestational diabetes (between 24 and 28 weeks). Rh antibodies. Urine tests to check for infections, diabetes, or protein in the urine. An ultrasound to confirm the proper growth and development of the baby. An amniocentesis to check for possible genetic  problems. Fetal screens for spina bifida and Down syndrome. HOME CARE INSTRUCTIONS  Avoid all smoking, herbs, alcohol, and unprescribed drugs. These chemicals affect the formation and growth of the baby. Follow your health care provider's instructions regarding medicine use. There are medicines that are either safe or unsafe to take during pregnancy. Exercise only as directed by your health care provider. Experiencing uterine cramps is a good sign to stop exercising. Continue to eat regular, healthy meals. Wear a good support bra for breast tenderness. Do not use hot tubs, steam rooms, or saunas. Wear your seat belt at all times when driving. Avoid raw meat, uncooked cheese, cat litter boxes, and soil used by cats. These carry germs that can cause birth defects in the baby. Take your prenatal vitamins. Try taking a stool softener (if your health care provider approves) if you develop constipation. Eat more high-fiber foods, such as fresh vegetables or fruit and whole grains. Drink plenty of fluids to keep your urine clear or pale yellow. Take warm sitz baths to soothe any pain or discomfort caused by hemorrhoids. Use hemorrhoid cream if your health care provider approves. If you develop varicose veins, wear support hose. Elevate your feet for 15 minutes, 3-4 times a day. Limit salt in your diet. Avoid heavy lifting, wear low heel shoes, and practice good posture. Rest with your legs elevated if you have leg cramps or low back pain. Visit your dentist if you have not gone yet during your pregnancy. Use a soft toothbrush to brush your teeth and be gentle when you floss. A sexual relationship may be continued unless your health care provider directs you otherwise. Continue to go to all your prenatal visits as directed by your health care provider. SEEK MEDICAL CARE IF:  You have dizziness. You have mild pelvic cramps, pelvic pressure, or nagging pain in the abdominal area. You have persistent  nausea, vomiting, or diarrhea. You have a bad smelling vaginal discharge. You have pain with urination. SEEK IMMEDIATE MEDICAL CARE IF:  You have a fever. You are leaking fluid from your vagina. You have spotting or bleeding from your vagina. You have severe abdominal cramping or pain. You have rapid weight gain or loss. You have shortness of breath with chest pain. You notice sudden or extreme swelling of your face, hands, ankles, feet, or legs. You have not felt your baby move in over an hour. You have severe headaches that do not go away with medicine. You have vision changes. Document Released: 02/01/2001 Document Revised: 02/12/2013 Document Reviewed: 04/10/2012 [facility] Patient Information 2015 [place], [place]. This information is not intended to replace advice given to you by your  health care provider. Make sure you discuss any questions you have with your health care provider.

## 2020-12-31 NOTE — Progress Notes (Signed)
HIGH-RISK PREGNANCY VISIT Patient name: Brooke [name] MRN ID:[id number]  Date of birth: 07-10-1985 Chief Complaint:   Routine Prenatal Visit, High Risk Gestation, and Pregnancy Ultrasound  History of Present Illness:   Brooke [name] is a 35 y.o. G27P0030 female at [redacted]w[redacted]d with an Estimated Date of Delivery: 04/17/21 being seen today for ongoing management of a high-risk pregnancy complicated by diabetes mellitus A2/BDM currently on metformin 500mg  BID .    Today she reports  FBS 82-110 (majority <95), 2hr pp 103-123 (only 2 >120) . Contractions: Not present. Vag. Bleeding: None.  Movement: Present. denies leaking of fluid.   Depression screen [facility] 2/9 12/18/2020 10/14/2020 10/01/2019 04/12/2018 04/12/2018  Decreased Interest 0 1 0 1 1  Down, Depressed, Hopeless 0 0 0 2 2  PHQ - 2 Score 0 1 0 3 3  Altered sleeping - 0 1 2 -  Tired, decreased energy - 1 3 2  -  Change in appetite - 0 0 3 -  Feeling bad or failure about yourself  - 0 0 2 -  Trouble concentrating - 0 0 1 -  Moving slowly or fidgety/restless - 0 0 0 -  Suicidal thoughts - 0 0 0 -  PHQ-9 Score - 2 4 13  -  Difficult doing work/chores - - Not difficult at all Somewhat difficult -  Some recent data might be hidden     GAD 7 : Generalized Anxiety Score 10/14/2020 10/01/2019  Nervous, Anxious, on Edge 0 0  Control/stop worrying 0 1  Worry too much - different things 1 1  Trouble relaxing 0 0  Restless 0 0  Easily annoyed or irritable 1 0  Afraid - awful might happen 0 0  Total GAD 7 Score 2 2  Anxiety Difficulty - Not difficult at all     Review of Systems:   Pertinent items are noted in HPI Denies abnormal vaginal discharge w/ itching/odor/irritation, headaches, visual changes, shortness of breath, chest pain, abdominal pain, severe nausea/vomiting, or problems with urination or bowel movements unless otherwise stated above. Pertinent History Reviewed:  Reviewed past medical,surgical, social, obstetrical and family  history.  Reviewed problem list, medications and allergies. Physical Assessment:   Vitals:   12/31/20 1420  BP: 136/69  Pulse: 96  Weight: (!) 339 lb 3.2 oz (153.9 kg)  Body mass index is 58.22 kg/m.           Physical Examination:   General appearance: alert, well appearing, and in no distress  Mental status: alert, oriented to person, place, and time  Skin: warm & dry   Extremities: Edema: Trace    Cardiovascular: normal heart rate noted  Respiratory: normal respiratory effort, no distress  Abdomen: gravid, soft, non-tender  Pelvic: Cervical exam deferred         Fetal Status: Fetal Heart Rate (bpm): 135 u/s   Movement: Present    Fetal Surveillance Testing today: [place] 24+5 wks,breech,anterior placenta gr 1,FHR 135 bpm,cx 3.1 cm,SVP of fluid 6.5 cm,EFW 787 g 64%  Chaperone: N/A    No results found for this or any previous visit (from the past 24 hour(s)).  Assessment & Plan:  High-risk pregnancy: G4P0030 at [redacted]w[redacted]d with an Estimated Date of Delivery: 04/17/21   1) A2/BDM, stable on metformin 500mg  BID  2) MS, off meds right now  Meds: No orders of the defined types were placed in this encounter.   Labs/procedures today: U/S  Treatment Plan:   Growth u/s q4wks  2x/wk testing or weekly BPP @ 32wks    Deliver @ 39wks:_____   Reviewed: Preterm labor symptoms and general obstetric precautions including but not limited to vaginal bleeding, contractions, leaking of fluid and fetal movement were reviewed in detail with the patient.  All questions were answered. Does have home bp cuff. Office bp cuff given: not applicable. Check bp weekly, let us know if consistently >140 and/or >90.  Follow-up: Return for As scheduled.   Future Appointments  Date Time Provider Department Center  01/12/2021  8:30 AM [facility]  01/12/2021  9:10 AM Lazaro Arms, MD [facility]  02/04/2021  2:30 PM Sater, Pearletha Furl, MD [facility] None    No orders of the defined  types were placed in this encounter.  Cheral Marker CNM, [facility] 12/31/2020 3:00 PM

## 2021-01-12 ENCOUNTER — Ambulatory Visit (INDEPENDENT_AMBULATORY_CARE_PROVIDER_SITE_OTHER): Payer: Medicaid Other | Admitting: Obstetrics & Gynecology

## 2021-01-12 ENCOUNTER — Encounter: Payer: Self-pay | Admitting: Obstetrics & Gynecology

## 2021-01-12 ENCOUNTER — Other Ambulatory Visit: Payer: Medicaid Other

## 2021-01-12 ENCOUNTER — Other Ambulatory Visit: Payer: Self-pay

## 2021-01-12 ENCOUNTER — Encounter: Payer: Self-pay | Admitting: Women's Health

## 2021-01-12 VITALS — BP 120/79 | HR 96 | Wt 337.5 lb

## 2021-01-12 DIAGNOSIS — O0990 Supervision of high risk pregnancy, unspecified, unspecified trimester: Secondary | ICD-10-CM

## 2021-01-12 DIAGNOSIS — Z3A26 26 weeks gestation of pregnancy: Secondary | ICD-10-CM

## 2021-01-12 DIAGNOSIS — O24415 Gestational diabetes mellitus in pregnancy, controlled by oral hypoglycemic drugs: Secondary | ICD-10-CM

## 2021-01-12 LAB — POCT URINALYSIS DIPSTICK OB
Blood, UA: NEGATIVE
Glucose, UA: NEGATIVE
Nitrite, UA: NEGATIVE

## 2021-01-12 NOTE — Progress Notes (Signed)
HIGH-RISK PREGNANCY VISIT Patient name: Brooke [name] MRN [id number]  Date of birth: May 07, 1985 Chief Complaint:   High Risk Gestation (PN2 today!! )  History of Present Illness:   Brooke [name] is a 35 y.o. G34P0030 female at [redacted]w[redacted]d with an Estimated Date of Delivery: 04/17/21 being seen today for ongoing management of a high-risk pregnancy complicated by diabetes mellitus A2/BDM currently on metformin 500 BID with good control .    Today she reports no complaints. Contractions: Not present. Vag. Bleeding: None.  Movement: Present. denies leaking of fluid.   Depression screen Brooke [name] 2/9 01/12/2021 12/18/2020 10/14/2020 10/01/2019 04/12/2018  Decreased Interest 2 0 1 0 1  Down, Depressed, Hopeless 0 0 0 0 2  PHQ - 2 Score 2 0 1 0 3  Altered sleeping 1 - 0 1 2  Tired, decreased energy 1 - 1 3 2   Change in appetite 0 - 0 0 3  Feeling bad or failure about yourself  0 - 0 0 2  Trouble concentrating 0 - 0 0 1  Moving slowly or fidgety/restless 0 - 0 0 0  Suicidal thoughts 0 - 0 0 0  PHQ-9 Score 4 - 2 4 13   Difficult doing work/chores - - - Not difficult at all Somewhat difficult  Some recent data might be hidden     GAD 7 : Generalized Anxiety Score 01/12/2021 10/14/2020 10/01/2019  Nervous, Anxious, on Edge 0 0 0  Control/stop worrying 0 0 1  Worry too much - different things 0 1 1  Trouble relaxing 1 0 0  Restless 0 0 0  Easily annoyed or irritable 1 1 0  Afraid - awful might happen 0 0 0  Total GAD 7 Score 2 2 2   Anxiety Difficulty - - Not difficult at all     Review of Systems:   Pertinent items are noted in HPI Denies abnormal vaginal discharge w/ itching/odor/irritation, headaches, visual changes, shortness of breath, chest pain, abdominal pain, severe nausea/vomiting, or problems with urination or bowel movements unless otherwise stated above. Pertinent History Reviewed:  Reviewed past medical,surgical, social, obstetrical and family history.  Reviewed problem list,  medications and allergies. Physical Assessment:   Vitals:   01/12/21 0846  BP: 120/79  Pulse: 96  Weight: (!) 337 lb 8 oz (153.1 kg)  Body mass index is 57.93 kg/m.           Physical Examination:   General appearance: alert, well appearing, and in no distress  Mental status: alert, oriented to person, place, and time  Skin: warm & dry   Extremities: Edema: Trace    Cardiovascular: normal heart rate noted  Respiratory: normal respiratory effort, no distress  Abdomen: gravid, soft, non-tender  Pelvic: Cervical exam deferred         Fetal Status:     Movement: Present    Fetal Surveillance Testing today: FHR 142   Chaperone: N/A    Results for orders placed or performed in visit on 01/12/21 (from the past 24 hour(s))  POC Urinalysis Dipstick OB   Collection Time: 01/12/21  8:49 AM  Result Value Ref Range   Color, UA     Clarity, UA     Glucose, UA Negative Negative   Bilirubin, UA     Ketones, UA trace    Spec Grav, UA     Blood, UA neg    pH, UA     POC,PROTEIN,UA Trace Negative, Trace, Small (1+), Moderate (2+), Large (3+), 4+  Urobilinogen, UA     Nitrite, UA neg    Leukocytes, UA Trace (A) Negative   Appearance     Odor      Assessment & Plan:  High-risk pregnancy: G4P0030 at [redacted]w[redacted]d with an Estimated Date of Delivery: 04/17/21   1) A2/B DM, stable, on metformin 500 BID with good blood sugar control   2) MS, no meds currently, stable  *FH is U+14  Meds: No orders of the defined types were placed in this encounter.   Labs/procedures today: none  Treatment Plan:  per protocol  Reviewed: Preterm labor symptoms and general obstetric precautions including but not limited to vaginal bleeding, contractions, leaking of fluid and fetal movement were reviewed in detail with the patient.  All questions were answered. Does have home bp cuff. Office bp cuff given: not applicable. Check bp weekly, let us know if consistently >140 and/or >90.  Follow-up: No follow-ups  on file.   Future Appointments  Date Time Provider Department Center  01/12/2021  9:10 AM Lazaro Arms, MD [facility]  02/04/2021  2:30 PM Sater, Pearletha Furl, MD [facility] None    Orders Placed This Encounter  Procedures   POC Urinalysis Dipstick OB   Amaryllis Dyke Trevelle Mcgurn  01/12/2021 9:00 AM

## 2021-01-13 LAB — CBC
Hematocrit: 32.5 % — ABNORMAL LOW (ref 34.0–46.6)
Hemoglobin: 11.5 g/dL (ref 11.1–15.9)
MCH: 29.8 pg (ref 26.6–33.0)
MCHC: 35.4 g/dL (ref 31.5–35.7)
MCV: 84 fL (ref 79–97)
Platelets: 233 10*3/uL (ref 150–450)
RBC: 3.86 x10E6/uL (ref 3.77–5.28)
RDW: 12.6 % (ref 11.7–15.4)
WBC: 3.4 10*3/uL (ref 3.4–10.8)

## 2021-01-13 LAB — ANTIBODY SCREEN: Antibody Screen: NEGATIVE

## 2021-01-13 LAB — HIV ANTIBODY (ROUTINE TESTING W REFLEX): HIV Screen 4th Generation wRfx: NONREACTIVE

## 2021-01-13 LAB — RPR: RPR Ser Ql: NONREACTIVE

## 2021-01-20 ENCOUNTER — Telehealth: Payer: Self-pay | Admitting: Neurology

## 2021-01-20 NOTE — Telephone Encounter (Signed)
Patient is wondering if she needs to keep her appointment on 02/04/21 or reschedule until after she gives birth. She is currently 7 months pregnant and thought she was told she was not going to be seen here while pregnant. Please advise.

## 2021-01-21 NOTE — Telephone Encounter (Signed)
I spoke with patient and she agreed to cancel her 12/15 appointment and she will call us back to reschedule after she gives birth.

## 2021-02-02 ENCOUNTER — Ambulatory Visit (INDEPENDENT_AMBULATORY_CARE_PROVIDER_SITE_OTHER): Payer: Medicaid Other | Admitting: Women's Health

## 2021-02-02 ENCOUNTER — Other Ambulatory Visit: Payer: Self-pay

## 2021-02-02 ENCOUNTER — Encounter: Payer: Self-pay | Admitting: Women's Health

## 2021-02-02 VITALS — BP 127/77 | HR 107 | Wt 345.0 lb

## 2021-02-02 DIAGNOSIS — Z3A29 29 weeks gestation of pregnancy: Secondary | ICD-10-CM

## 2021-02-02 DIAGNOSIS — O0990 Supervision of high risk pregnancy, unspecified, unspecified trimester: Secondary | ICD-10-CM

## 2021-02-02 DIAGNOSIS — Z23 Encounter for immunization: Secondary | ICD-10-CM | POA: Diagnosis not present

## 2021-02-02 DIAGNOSIS — O99212 Obesity complicating pregnancy, second trimester: Secondary | ICD-10-CM

## 2021-02-02 DIAGNOSIS — O0993 Supervision of high risk pregnancy, unspecified, third trimester: Secondary | ICD-10-CM

## 2021-02-02 DIAGNOSIS — O24419 Gestational diabetes mellitus in pregnancy, unspecified control: Secondary | ICD-10-CM

## 2021-02-02 LAB — POCT URINALYSIS DIPSTICK OB
Blood, UA: NEGATIVE
Glucose, UA: NEGATIVE
Ketones, UA: NEGATIVE
Leukocytes, UA: NEGATIVE
Nitrite, UA: NEGATIVE
POC,PROTEIN,UA: NEGATIVE

## 2021-02-02 NOTE — Patient Instructions (Signed)
Brooke [name], thank you for choosing our office today! We appreciate the opportunity to meet your healthcare needs. You may receive a short survey by mail, e-mail, or through [employer]. If you are happy with your care we would appreciate if you could take just a few minutes to complete the survey questions. We read all of your comments and take your feedback very seriously. Thank you again for choosing our office.  Center for [employer] Team at [facility]  [facility] at [facility] ([address] [place], [place] [address]) Entrance C, located off of [address] Free 24/7 valet parking   CLASSES: Go to [email or web address] to register for classes (childbirth, breastfeeding, waterbirth, infant CPR, daddy bootcamp, etc.)  Call the office [phone]) or go to [facility] if: You begin to have strong, frequent contractions Your water breaks.  Sometimes it is a big gush of fluid, sometimes it is just a trickle that keeps getting your panties wet or running down your legs You have vaginal bleeding.  It is normal to have a small amount of spotting if your cervix was checked.  You don't feel your baby moving like normal.  If you don't, get you something to eat and drink and lay down and focus on feeling your baby move.   If your baby is still not moving like normal, you should call the office or go to [facility].  Call the office [phone]) or go to [facility] hospital for these signs of pre-eclampsia: Severe headache that does not go away with Tylenol Visual changes- seeing spots, double, blurred vision Pain under your right breast or upper abdomen that does not go away with Tums or heartburn medicine Nausea and/or vomiting Severe swelling in your hands, feet, and face   Tdap Vaccine It is recommended that you get the Tdap vaccine during the third trimester of EACH pregnancy to help protect your baby from getting pertussis (whooping cough) 27-36 weeks is the BEST time to do  this so that you can pass the protection on to your baby. During pregnancy is better than after pregnancy, but if you are unable to get it during pregnancy it will be offered at the hospital.  You can get this vaccine with [place], at the health department, your family doctor, or some local pharmacies Everyone who will be around your baby should also be up-to-date on their vaccines before the baby comes. Adults (who are not pregnant) only need 1 dose of Tdap during adulthood.   [facility] Pediatricians/Family Doctors [facility]): [address] [place], [phone]           [facility]: [address], [phone]                [facility] Medicine [facility]): [address], [phone] (call to ask if accepting patients) [facility] Department: [address], [place], [phone]    [facility] Pediatricians/Family Doctors [facility]): [phone], [phone] [facility]: [address] [place], [phone] [facility]: [address], [phone]  [facility] Doctors  [facility]): [phone] [facility]: [address] [phone]   [facility] Doctors [facility]: [address], [phone]  [facility] Family Doctors  [facility]: [phone], [phone]  Home Blood Pressure Monitoring for Patients   Your provider has recommended that you check your  blood pressure (BP) at least once a week at home. If you do not have a blood pressure cuff at home, one will be provided for you. Contact your provider if you have not received your monitor within 1 week.   Helpful Tips for Accurate Home Blood Pressure Checks  Don't smoke, exercise, or drink caffeine 30 minutes before checking your BP Use the restroom before checking your BP (a full bladder can raise your  pressure) Relax in a comfortable upright chair Feet on the ground Left arm resting comfortably on a flat surface at the level of your heart Legs uncrossed Back supported Sit quietly and don't talk Place the cuff on your bare arm Adjust snuggly, so that only two fingertips can fit between your skin and the top of the cuff Check 2 readings separated by at least one minute Keep a log of your BP readings For a visual, please reference this diagram: http://ccnc.care/bpdiagram  Provider Name: [facility]     Phone: [phone]  Zone 1: ALL CLEAR  Continue to monitor your symptoms:  BP reading is less than 140 (top number) or less than 90 (bottom number)  No right upper stomach pain No headaches or seeing spots No feeling nauseated or throwing up No swelling in face and hands  Zone 2: CAUTION Call your doctor's office for any of the following:  BP reading is greater than 140 (top number) or greater than 90 (bottom number)  Stomach pain under your ribs in the middle or right side Headaches or seeing spots Feeling nauseated or throwing up Swelling in face and hands  Zone 3: EMERGENCY  Seek immediate medical care if you have any of the following:  BP reading is greater than160 (top number) or greater than 110 (bottom number) Severe headaches not improving with Tylenol Serious difficulty catching your breath Any worsening symptoms from Zone 2   Third Trimester of Pregnancy The third trimester is from week 29 through week 42, months 7 through 9. The third trimester is a time when the fetus is growing rapidly. At the end of the ninth month, the fetus is about 20 inches in length and weighs 6-10 pounds.  BODY CHANGES Your body goes through many changes during pregnancy. The changes vary from woman to woman.  Your weight will continue to increase. You can expect to gain 25-35 pounds (11-16 kg) by the end of the pregnancy. You may begin to get stretch marks on your hips, abdomen,  and breasts. You may urinate more often because the fetus is moving lower into your pelvis and pressing on your bladder. You may develop or continue to have heartburn as a result of your pregnancy. You may develop constipation because certain hormones are causing the muscles that push waste through your intestines to slow down. You may develop hemorrhoids or swollen, bulging veins (varicose veins). You may have pelvic pain because of the weight gain and pregnancy hormones relaxing your joints between the bones in your pelvis. Backaches may result from overexertion of the muscles supporting your posture. You may have changes in your hair. These can include thickening of your hair, rapid growth, and changes in texture. Some women also have hair loss during or after pregnancy, or hair that feels dry or thin. Your hair will most likely return to normal after your baby is born. Your breasts will continue to grow and be tender. A yellow discharge may leak from your breasts called colostrum. Your belly button may stick out. You may   feel short of breath because of your expanding uterus. You may notice the fetus "dropping," or moving lower in your abdomen. You may have a bloody mucus discharge. This usually occurs a few days to a week before labor begins. Your cervix becomes thin and soft (effaced) near your due date. WHAT TO EXPECT AT YOUR PRENATAL EXAMS  You will have prenatal exams every 2 weeks until week 36. Then, you will have weekly prenatal exams. During a routine prenatal visit: You will be weighed to make sure you and the fetus are growing normally. Your blood pressure is taken. Your abdomen will be measured to track your baby's growth. The fetal heartbeat will be listened to. Any test results from the previous visit will be discussed. You may have a cervical check near your due date to see if you have effaced. At around 36 weeks, your caregiver will check your cervix. At the same time, your  caregiver will also perform a test on the secretions of the vaginal tissue. This test is to determine if a type of bacteria, Group B streptococcus, is present. Your caregiver will explain this further. Your caregiver may ask you: What your birth plan is. How you are feeling. If you are feeling the baby move. If you have had any abnormal symptoms, such as leaking fluid, bleeding, severe headaches, or abdominal cramping. If you have any questions. Other tests or screenings that may be performed during your third trimester include: Blood tests that check for low iron levels (anemia). Fetal testing to check the health, activity level, and growth of the fetus. Testing is done if you have certain medical conditions or if there are problems during the pregnancy. FALSE LABOR You may feel small, irregular contractions that eventually go away. These are called Braxton Hicks contractions, or false labor. Contractions may last for hours, days, or even weeks before true labor sets in. If contractions come at regular intervals, intensify, or become painful, it is best to be seen by your caregiver.  SIGNS OF LABOR  Menstrual-like cramps. Contractions that are 5 minutes apart or less. Contractions that start on the top of the uterus and spread down to the lower abdomen and back. A sense of increased pelvic pressure or back pain. A watery or bloody mucus discharge that comes from the vagina. If you have any of these signs before the 37th week of pregnancy, call your caregiver right away. You need to go to the hospital to get checked immediately. HOME CARE INSTRUCTIONS  Avoid all smoking, herbs, alcohol, and unprescribed drugs. These chemicals affect the formation and growth of the baby. Follow your caregiver's instructions regarding medicine use. There are medicines that are either safe or unsafe to take during pregnancy. Exercise only as directed by your caregiver. Experiencing uterine cramps is a good sign to  stop exercising. Continue to eat regular, healthy meals. Wear a good support bra for breast tenderness. Do not use hot tubs, steam rooms, or saunas. Wear your seat belt at all times when driving. Avoid raw meat, uncooked cheese, cat litter boxes, and soil used by cats. These carry germs that can cause birth defects in the baby. Take your prenatal vitamins. Try taking a stool softener (if your caregiver approves) if you develop constipation. Eat more high-fiber foods, such as fresh vegetables or fruit and whole grains. Drink plenty of fluids to keep your urine clear or pale yellow. Take warm sitz baths to soothe any pain or discomfort caused by hemorrhoids. Use hemorrhoid cream if  your caregiver approves. If you develop varicose veins, wear support hose. Elevate your feet for 15 minutes, 3-4 times a day. Limit salt in your diet. Avoid heavy lifting, wear low heal shoes, and practice good posture. Rest a lot with your legs elevated if you have leg cramps or low back pain. Visit your dentist if you have not gone during your pregnancy. Use a soft toothbrush to brush your teeth and be gentle when you floss. A sexual relationship may be continued unless your caregiver directs you otherwise. Do not travel far distances unless it is absolutely necessary and only with the approval of your caregiver. Take prenatal classes to understand, practice, and ask questions about the labor and delivery. Make a trial run to the hospital. Pack your hospital bag. Prepare the baby's nursery. Continue to go to all your prenatal visits as directed by your caregiver. SEEK MEDICAL CARE IF: You are unsure if you are in labor or if your water has broken. You have dizziness. You have mild pelvic cramps, pelvic pressure, or nagging pain in your abdominal area. You have persistent nausea, vomiting, or diarrhea. You have a bad smelling vaginal discharge. You have pain with urination. SEEK IMMEDIATE MEDICAL CARE IF:  You  have a fever. You are leaking fluid from your vagina. You have spotting or bleeding from your vagina. You have severe abdominal cramping or pain. You have rapid weight loss or gain. You have shortness of breath with chest pain. You notice sudden or extreme swelling of your face, hands, ankles, feet, or legs. You have not felt your baby move in over an hour. You have severe headaches that do not go away with medicine. You have vision changes. Document Released: 02/01/2001 Document Revised: 02/12/2013 Document Reviewed: 04/10/2012 [facility] Patient Information 2015 [place], [place]. This information is not intended to replace advice given to you by your health care provider. Make sure you discuss any questions you have with your health care provider.

## 2021-02-02 NOTE — Progress Notes (Signed)
[facility] PREGNANCY VISIT Patient name: Brooke [name] MRN [id number]  Date of birth: [birth year] Chief Complaint:   Routine Prenatal Visit  History of Present Illness:   Brooke [name] is a 35 y.o. G26P0030 female at [redacted]w[redacted]d with an Estimated Date of Delivery: 04/17/21 being seen today for ongoing management of a high-risk pregnancy complicated by diabetes mellitus A2/BDM currently on metformin 500mg  BID .    Today she reports  all sugars wnl, Lt ear pain when lays on it, Lt foot swollen . Contractions: Not present. Vag. Bleeding: None.  Movement: Present. denies leaking of fluid.   Depression screen [facility] 2/9 01/12/2021 12/18/2020 10/14/2020 10/01/2019 04/12/2018  Decreased Interest 2 0 1 0 1  Down, Depressed, Hopeless 0 0 0 0 2  PHQ - 2 Score 2 0 1 0 3  Altered sleeping 1 - 0 1 2  Tired, decreased energy 1 - 1 3 2   Change in appetite 0 - 0 0 3  Feeling bad or failure about yourself  0 - 0 0 2  Trouble concentrating 0 - 0 0 1  Moving slowly or fidgety/restless 0 - 0 0 0  Suicidal thoughts 0 - 0 0 0  PHQ-9 Score 4 - 2 4 13   Difficult doing work/chores - - - Not difficult at all Somewhat difficult  Some recent data might be hidden     [name] 7 : Generalized Anxiety Score 01/12/2021 10/14/2020 10/01/2019  Nervous, Anxious, on Edge 0 0 0  Control/stop worrying 0 0 1  Worry too much - different things 0 1 1  Trouble relaxing 1 0 0  Restless 0 0 0  Easily annoyed or irritable 1 1 0  Afraid - awful might happen 0 0 0  Total [name] 7 Score 2 2 2   Anxiety Difficulty - - Not difficult at all     Review of Systems:   Pertinent items are noted in HPI Denies abnormal vaginal discharge w/ itching/odor/irritation, headaches, visual changes, shortness of breath, chest pain, abdominal pain, severe nausea/vomiting, or problems with urination or bowel movements unless otherwise stated above. Pertinent History Reviewed:  Reviewed past medical,surgical, social, obstetrical and family history.   Reviewed problem list, medications and allergies. Physical Assessment:   Vitals:   02/02/21 1129  BP: 127/77  Pulse: (!) 107  Weight: (!) 345 lb (156.5 kg)  Body mass index is 59.22 kg/m.           Physical Examination:   General appearance: alert, well appearing, and in no distress  Mental status: alert, oriented to person, place, and time  Skin: warm & dry   Ear: wnl, no sign of infection (exam by Dr. 10/16/2020)  Extremities: Edema: Trace, Lt foot/ankle slightly more swollen than Rt, no evidence of DVT   Cardiovascular: normal heart rate noted  Respiratory: normal respiratory effort, no distress  Abdomen: gravid, soft, non-tender  Pelvic: Cervical exam deferred         Fetal Status: Fetal Heart Rate (bpm): 145 Fundal Height: 17 cm Movement: Present   FH is from U  Fetal Surveillance Testing today: doppler   Chaperone: N/A    Results for orders placed or performed in visit on 02/02/21 (from the past 24 hour(s))  POC Urinalysis Dipstick OB   Collection Time: 02/02/21 11:37 AM  Result Value Ref Range   Color, UA     Clarity, UA     Glucose, UA Negative Negative   Bilirubin, UA     Ketones, UA  neg    Spec Grav, UA     Blood, UA neg    pH, UA     POC,PROTEIN,UA Negative Negative, Trace, Small (1+), Moderate (2+), Large (3+), 4+   Urobilinogen, UA     Nitrite, UA neg    Leukocytes, UA Negative Negative   Appearance     Odor      Assessment & Plan:  High-risk pregnancy: G4P0030 at [redacted]w[redacted]d with an Estimated Date of Delivery: 04/17/21   1) A2/BDM dx @ 14wks, stable on metformin 500mg  BID  2) Lt ear pain, no evidence of infection per Dr. Nancy Fetter, to try Flonase  Meds: No orders of the defined types were placed in this encounter.   Labs/procedures today: flu shot and tdap  Treatment Plan:  Growth u/s q4wks       2x/wk testing or weekly BPP @ 32wks    Deliver @ 39wks:_____   Reviewed: Preterm labor symptoms and general obstetric precautions including but not limited to vaginal  bleeding, contractions, leaking of fluid and fetal movement were reviewed in detail with the patient.  All questions were answered. Does have home bp cuff. Office bp cuff given: not applicable. Check bp weekly, let us know if consistently >140 and/or >90.  Follow-up: Return for 1/3 efw/bpp u/s & hrob md/cnm.   Future Appointments  Date Time Provider [place]  02/26/2021 11:10 AM [facility] NURSE [facility]    Orders Placed This Encounter  Procedures   US FETAL BPP WO NON STRESS   US OB Follow Up   Tdap vaccine greater than or equal to 7yo IM   POC Urinalysis Dipstick OB   Roma Schanz CNM, [facility] 02/02/2021 12:30 PM

## 2021-02-04 ENCOUNTER — Ambulatory Visit: Payer: Medicaid Other | Admitting: Neurology

## 2021-02-21 NOTE — L&D Delivery Note (Signed)
OB/GYN Faculty Practice Delivery Note  Brooke [name] is a 36 y.o. G4P0030 s/p SVD at [redacted]w[redacted]d. She was admitted for IOL for A2GDM.   ROM: 18h 13m with clear fluid GBS Status: positive>PCN Maximum Maternal Temperature: 99.3  Labor Progress: Presented for IOL, had a cooks catheter placed and received one cytotec. The balloon came out and she was AROMed and started on pitocin. She had extreme pain and pressure at 5 cm and the pitocin was paused for a bit while her epidural was replaced. She eventually was more comfortable and the pitocin was restarted and she progressed to complete  Delivery Date/Time: 0135 on 04/11/2021 Delivery: Called to room and patient was complete and pushing. Head delivered ROA. Nuchal cord X1 present and delivered through. Shoulder and body delivered in usual fashion. Infant with spontaneous cry, placed on mother's abdomen, dried and stimulated. Cord clamped x 2 after 1-minute delay, and cut by father of baby. Cord blood drawn. Placenta delivered spontaneously with gentle cord traction. Fundus firm with massage and Pitocin. Labia, perineum, vagina, and cervix inspected and found to have a 2nd degree perineal laceration that was repaired with 3-0 vicryl in usual running fashion. She also had a midline periurethral laceration that was repaired with 4-0 monocryl with  two interrupted sutures.   Placenta: intact, 3V cord, L&D Complications: none Lacerations: 2nd degree perineal and periurethral EBL: 100cc Analgesia: epidural   Infant: female   APGARs 9,9    weight pending  Warner Mccreedy, MD, MPH OB Fellow, Faculty Practice Center for [facility], [facility] 04/11/2021, 2:29 AM

## 2021-02-24 ENCOUNTER — Other Ambulatory Visit: Payer: Self-pay

## 2021-02-24 ENCOUNTER — Ambulatory Visit (INDEPENDENT_AMBULATORY_CARE_PROVIDER_SITE_OTHER): Payer: Medicaid Other

## 2021-02-24 ENCOUNTER — Ambulatory Visit (INDEPENDENT_AMBULATORY_CARE_PROVIDER_SITE_OTHER): Payer: Medicaid Other | Admitting: Advanced Practice Midwife

## 2021-02-24 VITALS — BP 132/87 | HR 102 | Wt 336.2 lb

## 2021-02-24 DIAGNOSIS — O24419 Gestational diabetes mellitus in pregnancy, unspecified control: Secondary | ICD-10-CM | POA: Diagnosis not present

## 2021-02-24 DIAGNOSIS — F1290 Cannabis use, unspecified, uncomplicated: Secondary | ICD-10-CM

## 2021-02-24 DIAGNOSIS — O99213 Obesity complicating pregnancy, third trimester: Secondary | ICD-10-CM

## 2021-02-24 DIAGNOSIS — R8271 Bacteriuria: Secondary | ICD-10-CM

## 2021-02-24 DIAGNOSIS — Z3A32 32 weeks gestation of pregnancy: Secondary | ICD-10-CM | POA: Diagnosis not present

## 2021-02-24 DIAGNOSIS — O99891 Other specified diseases and conditions complicating pregnancy: Secondary | ICD-10-CM

## 2021-02-24 DIAGNOSIS — O0993 Supervision of high risk pregnancy, unspecified, third trimester: Secondary | ICD-10-CM | POA: Diagnosis not present

## 2021-02-24 DIAGNOSIS — O0990 Supervision of high risk pregnancy, unspecified, unspecified trimester: Secondary | ICD-10-CM

## 2021-02-24 MED ORDER — OMEPRAZOLE 10 MG PO CPDR: 10.0000 mg | DELAYED_RELEASE_CAPSULE | Freq: Every day | ORAL | 3 refills | Status: DC

## 2021-02-24 NOTE — Progress Notes (Signed)
[place] 32+4 wks,cephalic,BPP 8/8,FHR 142 bpm,anterior placenta gr 3,afi 13.9 cm,EFW 2061 g 48%,limited view

## 2021-02-24 NOTE — Progress Notes (Signed)
HIGH-RISK PREGNANCY VISIT Patient name: Brooke [name] MRN [id number]  Date of birth: 1985-09-26 Chief Complaint:   Routine Prenatal Visit  History of Present Illness:   Brooke [name] is a 36 y.o. G29P0030 female at [redacted]w[redacted]d with an Estimated Date of Delivery: 04/17/21 being seen today for ongoing management of a high-risk pregnancy complicated by diabetes mellitus A2/BDM currently on Metformin 500mg  bid .    FBS: all <95, 2hrPPs: all <120  Today she reports heartburn/reflux. Contractions: Not present. Vag. Bleeding: None.  Movement: Present. denies leaking of fluid.   Depression screen [facility] 2/9 01/12/2021 12/18/2020 10/14/2020 10/01/2019 04/12/2018  Decreased Interest 2 0 1 0 1  Down, Depressed, Hopeless 0 0 0 0 2  PHQ - 2 Score 2 0 1 0 3  Altered sleeping 1 - 0 1 2  Tired, decreased energy 1 - 1 3 2   Change in appetite 0 - 0 0 3  Feeling bad or failure about yourself  0 - 0 0 2  Trouble concentrating 0 - 0 0 1  Moving slowly or fidgety/restless 0 - 0 0 0  Suicidal thoughts 0 - 0 0 0  PHQ-9 Score 4 - 2 4 13   Difficult doing work/chores - - - Not difficult at all Somewhat difficult  Some recent data might be hidden     GAD 7 : Generalized Anxiety Score 01/12/2021 10/14/2020 10/01/2019  Nervous, Anxious, on Edge 0 0 0  Control/stop worrying 0 0 1  Worry too much - different things 0 1 1  Trouble relaxing 1 0 0  Restless 0 0 0  Easily annoyed or irritable 1 1 0  Afraid - awful might happen 0 0 0  Total GAD 7 Score 2 2 2   Anxiety Difficulty - - Not difficult at all     Review of Systems:   Pertinent items are noted in HPI Denies abnormal vaginal discharge w/ itching/odor/irritation, headaches, visual changes, shortness of breath, chest pain, abdominal pain, severe nausea/vomiting, or problems with urination or bowel movements unless otherwise stated above. Pertinent History Reviewed:  Reviewed past medical,surgical, social, obstetrical and family history.  Reviewed problem  list, medications and allergies. Physical Assessment:   Vitals:   02/24/21 1414  BP: 132/87  Pulse: (!) 102  Weight: (!) 336 lb 3.2 oz (152.5 kg)  Body mass index is 57.71 kg/m.           Physical Examination:   General appearance: alert, well appearing, and in no distress  Mental status: alert, oriented to person, place, and time  Skin: warm & dry   Extremities: Edema: Trace    Cardiovascular: normal heart rate noted  Respiratory: normal respiratory effort, no distress  Abdomen: gravid, soft, non-tender  Pelvic: Cervical exam deferred         Fetal Status: Fetal Heart Rate (bpm): 142 u/s   Movement: Present    Fetal Surveillance Testing today: [place] [id number] wks,cephalic,BPP [id number] bpm,anterior placenta gr 3,afi 13.9 cm,EFW 2061 g 48%,limited view    No results found for this or any previous visit (from the past 24 hour(s)).  Assessment & Plan:  High-risk pregnancy: G4P0030 at [redacted]w[redacted]d with an Estimated Date of Delivery: 04/17/21   1) A2/BDM dx @ 14wks, stable on Metformin 500mg  bid; EFW 48th% today  2) MS, stable without meds  3) GERD, rec Tums to start, decrease liquids in evening and try to stop eating ~6pm; sleep inclined; rx Prilosec if no relief   Meds:  Meds ordered  this encounter  Medications   omeprazole (PRILOSEC) 10 MG capsule    Sig: Take 1 capsule (10 mg total) by mouth daily.    Dispense:  30 capsule    Refill:  3    Order Specific Question:   Supervising Provider    Answer:   Florian Buff [[id number]]    Labs/procedures today: U/S  Treatment Plan:  twice weekly testing with IOL at 39wks  Reviewed: Preterm labor symptoms and general obstetric precautions including but not limited to vaginal bleeding, contractions, leaking of fluid and fetal movement were reviewed in detail with the patient.  All questions were answered. Does have home bp cuff. Office bp cuff given: not applicable. Check bp daily, let us know if consistently >140 and/or >90.  Follow-up: Return  for As scheduled.   Future Appointments  Date Time Provider [place]  03/02/2021 10:30 AM [facility] NURSE [facility]  03/02/2021 11:50 AM Florian Buff, MD [facility]  03/05/2021 11:30 AM [facility] NURSE [facility]  03/09/2021 10:10 AM [facility] NURSE [facility]  03/09/2021 10:50 AM Florian Buff, MD [facility]  03/12/2021  9:10 AM [facility] NURSE [facility]  03/16/2021 11:10 AM [facility] NURSE [facility]  03/16/2021 11:50 AM Roma Schanz, CNM [facility]  03/19/2021 11:10 AM [facility] NURSE [facility]  03/23/2021  1:30 PM [place] - [facility] [place] [facility] None  03/23/2021  2:30 PM Roma Schanz, CNM [facility]  03/26/2021 10:30 AM [facility] NURSE [facility]  03/30/2021 11:10 AM Roma Schanz, CNM [facility]  04/02/2021 11:30 AM [facility] NURSE [facility]  04/06/2021 11:10 AM Roma Schanz, CNM [facility]  04/09/2021 10:30 AM [facility] NURSE [facility]  04/13/2021 11:10 AM Roma Schanz, CNM [facility]    No orders of the defined types were placed in this encounter.  Myrtis Ser CNM 02/24/2021 2:52 PM

## 2021-02-26 ENCOUNTER — Other Ambulatory Visit: Payer: Medicaid Other

## 2021-03-02 ENCOUNTER — Other Ambulatory Visit: Payer: Self-pay

## 2021-03-02 ENCOUNTER — Ambulatory Visit: Payer: Medicaid Other | Admitting: *Deleted

## 2021-03-02 ENCOUNTER — Ambulatory Visit (INDEPENDENT_AMBULATORY_CARE_PROVIDER_SITE_OTHER): Payer: Medicaid Other | Admitting: Obstetrics & Gynecology

## 2021-03-02 VITALS — BP 118/76 | HR 103 | Wt 337.8 lb

## 2021-03-02 DIAGNOSIS — Z1389 Encounter for screening for other disorder: Secondary | ICD-10-CM

## 2021-03-02 DIAGNOSIS — O0990 Supervision of high risk pregnancy, unspecified, unspecified trimester: Secondary | ICD-10-CM

## 2021-03-02 DIAGNOSIS — O24415 Gestational diabetes mellitus in pregnancy, controlled by oral hypoglycemic drugs: Secondary | ICD-10-CM

## 2021-03-02 DIAGNOSIS — Z331 Pregnant state, incidental: Secondary | ICD-10-CM

## 2021-03-02 LAB — POCT URINALYSIS DIPSTICK OB
Blood, UA: NEGATIVE
Glucose, UA: NEGATIVE
Ketones, UA: NEGATIVE
Leukocytes, UA: NEGATIVE
Nitrite, UA: NEGATIVE

## 2021-03-02 NOTE — Progress Notes (Signed)
HIGH-RISK PREGNANCY VISIT Patient name: Brooke [name] MRN ID:[id number]  Date of birth: 1985-04-29 Chief Complaint:   Routine Prenatal Visit  History of Present Illness:   Brooke [name] is a 36 y.o. G40P0030 female at [redacted]w[redacted]d with an Estimated Date of Delivery: 04/17/21 being seen today for ongoing management of a high-risk pregnancy complicated by [id number].    Today she reports no complaints.  .  .   . denies leaking of fluid.   Depression screen [facility] 2/9 01/12/2021 12/18/2020 10/14/2020 10/01/2019 04/12/2018  Decreased Interest 2 0 1 0 1  Down, Depressed, Hopeless 0 0 0 0 2  PHQ - 2 Score 2 0 1 0 3  Altered sleeping 1 - 0 1 2  Tired, decreased energy 1 - 1 3 2   Change in appetite 0 - 0 0 3  Feeling bad or failure about yourself  0 - 0 0 2  Trouble concentrating 0 - 0 0 1  Moving slowly or fidgety/restless 0 - 0 0 0  Suicidal thoughts 0 - 0 0 0  PHQ-9 Score 4 - 2 4 13   Difficult doing work/chores - - - Not difficult at all Somewhat difficult  Some recent data might be hidden     GAD 7 : Generalized Anxiety Score 01/12/2021 10/14/2020 10/01/2019  Nervous, Anxious, on Edge 0 0 0  Control/stop worrying 0 0 1  Worry too much - different things 0 1 1  Trouble relaxing 1 0 0  Restless 0 0 0  Easily annoyed or irritable 1 1 0  Afraid - awful might happen 0 0 0  Total GAD 7 Score 2 2 2   Anxiety Difficulty - - Not difficult at all     Review of Systems:   Pertinent items are noted in HPI Denies abnormal vaginal discharge w/ itching/odor/irritation, headaches, visual changes, shortness of breath, chest pain, abdominal pain, severe nausea/vomiting, or problems with urination or bowel movements unless otherwise stated above. Pertinent History Reviewed:  Reviewed past medical,surgical, social, obstetrical and family history.  Reviewed problem list, medications and allergies. Physical Assessment:  There were no vitals filed for this visit.There is no height or weight on file to calculate  BMI.           Physical Examination:   General appearance: alert, well appearing, and in no distress  Mental status: alert, oriented to person, place, and time  Skin: warm & dry   Extremities:      Cardiovascular: normal heart rate noted  Respiratory: normal respiratory effort, no distress  Abdomen: gravid, soft, non-tender  Pelvic: Cervical exam deferred         Fetal Status:          Fetal Surveillance Testing today: Reactive NST  Brooke [name] is at [redacted]w[redacted]d Estimated Date of Delivery: 04/17/21  NST being performed due to A2DM  Today the NST is Reactive  Fetal Monitoring:  Baseline: 130 bpm, Variability: Good {> 6 bpm), Accelerations: Reactive, and Decelerations: Absent   reactive  The accelerations are >15 bpm and more than 2 in 20 minutes  Final diagnosis:  Reactive NST  Florian Buff, MD     Chaperone: N/A    Results for orders placed or performed in visit on 03/02/21 (from the past 24 hour(s))  POC Urinalysis Dipstick OB   Collection Time: 03/02/21 10:50 AM  Result Value Ref Range   Color, UA     Clarity, UA     Glucose, UA Negative Negative  Bilirubin, UA     Ketones, UA neg    Spec Grav, UA     Blood, UA neg    pH, UA     POC,PROTEIN,UA Trace Negative, Trace, Small (1+), Moderate (2+), Large (3+), 4+   Urobilinogen, UA     Nitrite, UA neg    Leukocytes, UA Negative Negative   Appearance     Odor      Assessment & Plan:  High-risk pregnancy: G4P0030 at [redacted]w[redacted]d with an Estimated Date of Delivery: 04/17/21   1) A2DM, stable, good control, on metformin 500 BID    Meds: No orders of the defined types were placed in this encounter.   Labs/procedures today: NST  Treatment Plan:  twice weekly NST, EFW 36-37 weeks  Reviewed: Preterm labor symptoms and general obstetric precautions including but not limited to vaginal bleeding, contractions, leaking of fluid and fetal movement were reviewed in detail with the patient.  All questions were answered. Does  have home bp cuff. Office bp cuff given: not applicable. Check bp 3x/week, let us know if consistently >140 and/or >90.  Follow-up: Return for keep scheduled.   Future Appointments  Date Time Provider [place]  03/02/2021 11:50 AM Florian Buff, MD [facility]  03/05/2021 11:30 AM [facility] NURSE [facility]  03/09/2021 10:10 AM [facility] NURSE [facility]  03/09/2021 10:50 AM Florian Buff, MD [facility]  03/12/2021  9:10 AM [facility] NURSE [facility]  03/16/2021 11:10 AM [facility] NURSE [facility]  03/16/2021 11:50 AM Roma Schanz, CNM [facility]  03/19/2021 11:10 AM [facility] NURSE [facility]  03/23/2021  1:30 PM [place] - [facility] [place] [facility] None  03/23/2021  2:30 PM Roma Schanz, CNM [facility]  03/26/2021 10:30 AM [facility] NURSE [facility]  03/30/2021 11:10 AM Roma Schanz, CNM [facility]  04/02/2021 11:30 AM [facility] NURSE [facility]  04/06/2021 11:10 AM Roma Schanz, CNM [facility]  04/09/2021 10:30 AM [facility] NURSE [facility]  04/13/2021 11:10 AM Roma Schanz, CNM [facility]    No orders of the defined types were placed in this encounter.  Florian Buff  03/02/2021 11:37 AM

## 2021-03-05 ENCOUNTER — Other Ambulatory Visit: Payer: Self-pay | Admitting: Medical

## 2021-03-05 ENCOUNTER — Other Ambulatory Visit: Payer: Self-pay

## 2021-03-05 ENCOUNTER — Ambulatory Visit (INDEPENDENT_AMBULATORY_CARE_PROVIDER_SITE_OTHER): Payer: Medicaid Other | Admitting: *Deleted

## 2021-03-05 ENCOUNTER — Other Ambulatory Visit (INDEPENDENT_AMBULATORY_CARE_PROVIDER_SITE_OTHER): Payer: Medicaid Other

## 2021-03-05 DIAGNOSIS — Z3A33 33 weeks gestation of pregnancy: Secondary | ICD-10-CM

## 2021-03-05 DIAGNOSIS — O0990 Supervision of high risk pregnancy, unspecified, unspecified trimester: Secondary | ICD-10-CM

## 2021-03-05 DIAGNOSIS — F1290 Cannabis use, unspecified, uncomplicated: Secondary | ICD-10-CM

## 2021-03-05 DIAGNOSIS — O24419 Gestational diabetes mellitus in pregnancy, unspecified control: Secondary | ICD-10-CM

## 2021-03-05 DIAGNOSIS — O24415 Gestational diabetes mellitus in pregnancy, controlled by oral hypoglycemic drugs: Secondary | ICD-10-CM

## 2021-03-05 DIAGNOSIS — O288 Other abnormal findings on antenatal screening of mother: Secondary | ICD-10-CM

## 2021-03-05 DIAGNOSIS — O99213 Obesity complicating pregnancy, third trimester: Secondary | ICD-10-CM

## 2021-03-05 DIAGNOSIS — O99212 Obesity complicating pregnancy, second trimester: Secondary | ICD-10-CM

## 2021-03-05 DIAGNOSIS — O99891 Other specified diseases and conditions complicating pregnancy: Secondary | ICD-10-CM

## 2021-03-05 DIAGNOSIS — R8271 Bacteriuria: Secondary | ICD-10-CM

## 2021-03-05 NOTE — Progress Notes (Signed)
° °  NURSE VISIT- NST  SUBJECTIVE:  Brooke [name] is a 36 y.o. G85P0030 female at [redacted]w[redacted]d, here for a NST for pregnancy complicated by A2/BDM currently on Metformin .  She reports active fetal movement, contractions: none, vaginal bleeding: none, membranes: intact.   OBJECTIVE:  LMP 07/11/2020   Appears well, no apparent distress  No results found for this or any previous visit (from the past 24 hour(s)).  NST: FHR baseline difficult to obtain due to positioning and body habitus.  Vonzella Nipple notified and at bedside to assess with bedside ultrasound.  Fetal heart rate confirmed and movement visualized. BPP ordered ASSESSMENT: G4P0030 at [redacted]w[redacted]d with A2/BDM currently on Metformin NST unable to obtain BPP 8/8  PLAN: Follow-up as scheduled  Jobe Marker  03/05/2021 12:11 PM

## 2021-03-05 NOTE — Progress Notes (Signed)
[place] 33+6 wks,cephalic,BPP 8/8,anterior placenta gr 2,AFI 12.6 cm,FHR 140 bpm,discussed results with Raynelle Fanning

## 2021-03-09 ENCOUNTER — Encounter: Payer: Self-pay | Admitting: Obstetrics & Gynecology

## 2021-03-09 ENCOUNTER — Ambulatory Visit: Payer: Medicaid Other | Admitting: *Deleted

## 2021-03-09 ENCOUNTER — Other Ambulatory Visit: Payer: Self-pay

## 2021-03-09 ENCOUNTER — Ambulatory Visit (INDEPENDENT_AMBULATORY_CARE_PROVIDER_SITE_OTHER): Payer: Medicaid Other | Admitting: Obstetrics & Gynecology

## 2021-03-09 DIAGNOSIS — Z1389 Encounter for screening for other disorder: Secondary | ICD-10-CM | POA: Diagnosis not present

## 2021-03-09 DIAGNOSIS — O24415 Gestational diabetes mellitus in pregnancy, controlled by oral hypoglycemic drugs: Secondary | ICD-10-CM

## 2021-03-09 DIAGNOSIS — Z3A34 34 weeks gestation of pregnancy: Secondary | ICD-10-CM

## 2021-03-09 DIAGNOSIS — O0990 Supervision of high risk pregnancy, unspecified, unspecified trimester: Secondary | ICD-10-CM

## 2021-03-09 DIAGNOSIS — Z331 Pregnant state, incidental: Secondary | ICD-10-CM

## 2021-03-09 LAB — POCT URINALYSIS DIPSTICK OB
Blood, UA: NEGATIVE
Glucose, UA: NEGATIVE
Ketones, UA: NEGATIVE
Leukocytes, UA: NEGATIVE
Nitrite, UA: NEGATIVE
POC,PROTEIN,UA: NEGATIVE

## 2021-03-09 NOTE — Progress Notes (Signed)
HIGH-RISK PREGNANCY VISIT Patient name: Brooke [name] MRN ID:[id number]  Date of birth: [birth year] Chief Complaint:   Routine Prenatal Visit and Non-stress Test  History of Present Illness:   Brooke [name] is a 36 y.o. G52P0030 female at [redacted]w[redacted]d with an Estimated Date of Delivery: 04/17/21 being seen today for ongoing management of a high-risk pregnancy complicated by Class [id number] DM on metformin 500 BID.    Today she reports no complaints.  .  .   . denies leaking of fluid.   Depression screen [facility] 2/9 01/12/2021 12/18/2020 10/14/2020 10/01/2019 04/12/2018  Decreased Interest 2 0 1 0 1  Down, Depressed, Hopeless 0 0 0 0 2  PHQ - 2 Score 2 0 1 0 3  Altered sleeping 1 - 0 1 2  Tired, decreased energy 1 - 1 3 2   Change in appetite 0 - 0 0 3  Feeling bad or failure about yourself  0 - 0 0 2  Trouble concentrating 0 - 0 0 1  Moving slowly or fidgety/restless 0 - 0 0 0  Suicidal thoughts 0 - 0 0 0  PHQ-9 Score 4 - 2 4 13   Difficult doing work/chores - - - Not difficult at all Somewhat difficult  Some recent data might be hidden     GAD 7 : Generalized Anxiety Score 01/12/2021 10/14/2020 10/01/2019  Nervous, Anxious, on Edge 0 0 0  Control/stop worrying 0 0 1  Worry too much - different things 0 1 1  Trouble relaxing 1 0 0  Restless 0 0 0  Easily annoyed or irritable 1 1 0  Afraid - awful might happen 0 0 0  Total GAD 7 Score 2 2 2   Anxiety Difficulty - - Not difficult at all     Review of Systems:   Pertinent items are noted in HPI Denies abnormal vaginal discharge w/ itching/odor/irritation, headaches, visual changes, shortness of breath, chest pain, abdominal pain, severe nausea/vomiting, or problems with urination or bowel movements unless otherwise stated above. Pertinent History Reviewed:  Reviewed past medical,surgical, social, obstetrical and family history.  Reviewed problem list, medications and allergies. Physical Assessment:  There were no vitals filed for this  visit.There is no height or weight on file to calculate BMI.           Physical Examination:   General appearance: alert, well appearing, and in no distress  Mental status: alert, oriented to person, place, and time  Skin: warm & dry   Extremities:      Cardiovascular: normal heart rate noted  Respiratory: normal respiratory effort, no distress  Abdomen: gravid, soft, non-tender  Pelvic: Cervical exam deferred         Fetal Status:          Fetal Surveillance Testing today: Reactive NST  Brooke [name] is at [redacted]w[redacted]d Estimated Date of Delivery: 04/17/21  NST being performed due to Class A2/B DM  Today the NST is Reactive  Fetal Monitoring:  Baseline: 140 bpm, Variability: Good {> 6 bpm), Accelerations: Reactive, and Decelerations: Absent   reactive  The accelerations are >15 bpm and more than 2 in 20 minutes  Final diagnosis:  Reactive NST  Florian Buff, MD     Chaperone: N/A    No results found for this or any previous visit (from the past 24 hour(s)).  Assessment & Plan:  High-risk pregnancy: G4P0030 at [redacted]w[redacted]d with an Estimated Date of Delivery: 04/17/21   1) A2?B DM good control metformin 500  BID, stable  2) MS quiet,   Meds: No orders of the defined types were placed in this encounter.   Labs/procedures today: NST  Treatment Plan:  twice weekly surveillance  Reviewed: Preterm labor symptoms and general obstetric precautions including but not limited to vaginal bleeding, contractions, leaking of fluid and fetal movement were reviewed in detail with the patient.  All questions were answered. Does have home bp cuff. Office bp cuff given: not applicable. Check bp daily, let us know if consistently >140 and/or >90.  Follow-up: Return for keep scheduled.   Future Appointments  Date Time Provider [place]  03/12/2021  9:10 AM [facility] NURSE [facility]  03/16/2021 11:10 AM [facility] NURSE [facility]  03/16/2021 11:50 AM Roma Schanz, CNM  [facility]  03/19/2021 11:10 AM [facility] NURSE [facility]  03/23/2021  1:30 PM [place] - [facility] [place] [facility] None  03/23/2021  2:30 PM Roma Schanz, CNM [facility]  03/26/2021 10:30 AM [facility] NURSE [facility]  03/30/2021 11:10 AM Roma Schanz, CNM [facility]  04/02/2021 11:30 AM [facility] NURSE [facility]  04/06/2021 11:10 AM Roma Schanz, CNM [facility]  04/09/2021 10:30 AM [facility] NURSE [facility]  04/13/2021 11:10 AM Roma Schanz, CNM [facility]    No orders of the defined types were placed in this encounter.  Florian Buff  03/09/2021 11:27 AM

## 2021-03-09 NOTE — Addendum Note (Signed)
Addended by: Federico Flake A on: 03/09/2021 11:34 AM   Modules accepted: Orders

## 2021-03-12 ENCOUNTER — Ambulatory Visit (INDEPENDENT_AMBULATORY_CARE_PROVIDER_SITE_OTHER): Payer: Medicaid Other | Admitting: *Deleted

## 2021-03-12 ENCOUNTER — Other Ambulatory Visit: Payer: Self-pay

## 2021-03-12 VITALS — BP 131/82 | HR 95 | Wt 338.2 lb

## 2021-03-12 DIAGNOSIS — O0990 Supervision of high risk pregnancy, unspecified, unspecified trimester: Secondary | ICD-10-CM | POA: Diagnosis not present

## 2021-03-12 DIAGNOSIS — O0993 Supervision of high risk pregnancy, unspecified, third trimester: Secondary | ICD-10-CM | POA: Diagnosis not present

## 2021-03-12 DIAGNOSIS — Z331 Pregnant state, incidental: Secondary | ICD-10-CM | POA: Diagnosis not present

## 2021-03-12 DIAGNOSIS — O288 Other abnormal findings on antenatal screening of mother: Secondary | ICD-10-CM

## 2021-03-12 DIAGNOSIS — Z1389 Encounter for screening for other disorder: Secondary | ICD-10-CM | POA: Diagnosis not present

## 2021-03-12 DIAGNOSIS — O24415 Gestational diabetes mellitus in pregnancy, controlled by oral hypoglycemic drugs: Secondary | ICD-10-CM | POA: Diagnosis not present

## 2021-03-12 DIAGNOSIS — Z3A34 34 weeks gestation of pregnancy: Secondary | ICD-10-CM

## 2021-03-12 LAB — POCT URINALYSIS DIPSTICK OB
Blood, UA: NEGATIVE
Glucose, UA: NEGATIVE
Leukocytes, UA: NEGATIVE
Nitrite, UA: NEGATIVE
POC,PROTEIN,UA: NEGATIVE

## 2021-03-12 NOTE — Progress Notes (Signed)
° °  NURSE VISIT- NST  SUBJECTIVE:  Brooke [name] is a 36 y.o. G58P0030 female at [redacted]w[redacted]d, here for a NST for pregnancy complicated by A2/BDM currently on Metformin .  She reports active fetal movement, contractions: none, vaginal bleeding: none, membranes: intact.   OBJECTIVE:  BP 131/82    Pulse 95    Wt (!) 338 lb 3.2 oz (153.4 kg)    LMP 07/11/2020    BMI 58.05 kg/m   Appears well, no apparent distress  Results for orders placed or performed in visit on 03/12/21 (from the past 24 hour(s))  POC Urinalysis Dipstick OB   Collection Time: 03/12/21  9:39 AM  Result Value Ref Range   Color, UA     Clarity, UA     Glucose, UA Negative Negative   Bilirubin, UA     Ketones, UA small    Spec Grav, UA     Blood, UA neg    pH, UA     POC,PROTEIN,UA Negative Negative, Trace, Small (1+), Moderate (2+), Large (3+), 4+   Urobilinogen, UA     Nitrite, UA neg    Leukocytes, UA Negative Negative   Appearance     Odor      NST: FHR baseline 125 bpm, Variability: moderate, Accelerations:present, Decelerations:  Absent= Cat 1/reactive Toco: none   ASSESSMENT: G4P0030 at [redacted]w[redacted]d with A2/BDM currently on Metformin NST reactive  PLAN: EFM strip reviewed by Dr. Despina Hidden   Recommendations: keep next appointment as scheduled    Jobe Marker  03/12/2021 10:40 AM

## 2021-03-15 NOTE — Progress Notes (Signed)
Erroneous encounter

## 2021-03-16 ENCOUNTER — Ambulatory Visit (INDEPENDENT_AMBULATORY_CARE_PROVIDER_SITE_OTHER): Payer: Medicaid Other | Admitting: Women's Health

## 2021-03-16 ENCOUNTER — Other Ambulatory Visit: Payer: Self-pay

## 2021-03-16 ENCOUNTER — Other Ambulatory Visit: Payer: Medicaid Other

## 2021-03-16 ENCOUNTER — Encounter: Payer: Self-pay | Admitting: Women's Health

## 2021-03-16 VITALS — BP 127/73 | HR 95 | Wt 337.0 lb

## 2021-03-16 DIAGNOSIS — O24415 Gestational diabetes mellitus in pregnancy, controlled by oral hypoglycemic drugs: Secondary | ICD-10-CM | POA: Diagnosis not present

## 2021-03-16 DIAGNOSIS — Z3A35 35 weeks gestation of pregnancy: Secondary | ICD-10-CM

## 2021-03-16 DIAGNOSIS — O0990 Supervision of high risk pregnancy, unspecified, unspecified trimester: Secondary | ICD-10-CM

## 2021-03-16 DIAGNOSIS — O0993 Supervision of high risk pregnancy, unspecified, third trimester: Secondary | ICD-10-CM

## 2021-03-16 LAB — POCT URINALYSIS DIPSTICK OB
Blood, UA: NEGATIVE
Glucose, UA: NEGATIVE
Ketones, UA: NEGATIVE
Leukocytes, UA: NEGATIVE
Nitrite, UA: NEGATIVE
POC,PROTEIN,UA: NEGATIVE

## 2021-03-16 NOTE — Patient Instructions (Signed)
Harla, thank you for choosing our office today! We appreciate the opportunity to meet your healthcare needs. You may receive a short survey by mail, e-mail, or through [employer]. If you are happy with your care we would appreciate if you could take just a few minutes to complete the survey questions. We read all of your comments and take your feedback very seriously. Thank you again for choosing our office.  Center for [employer] Team at [facility]  [facility] at [facility] ([address] [place], [place] [address]) Entrance C, located off of [address] Free 24/7 valet parking   CLASSES: Go to [email or web address] to register for classes (childbirth, breastfeeding, waterbirth, infant CPR, daddy bootcamp, etc.)  Call the office [phone]) or go to [facility] if: You begin to have strong, frequent contractions Your water breaks.  Sometimes it is a big gush of fluid, sometimes it is just a trickle that keeps getting your panties wet or running down your legs You have vaginal bleeding.  It is normal to have a small amount of spotting if your cervix was checked.  You don't feel your baby moving like normal.  If you don't, get you something to eat and drink and lay down and focus on feeling your baby move.   If your baby is still not moving like normal, you should call the office or go to [facility].  Call the office [phone]) or go to [facility] hospital for these signs of pre-eclampsia: Severe headache that does not go away with Tylenol Visual changes- seeing spots, double, blurred vision Pain under your right breast or upper abdomen that does not go away with Tums or heartburn medicine Nausea and/or vomiting Severe swelling in your hands, feet, and face   Tdap Vaccine It is recommended that you get the Tdap vaccine during the third trimester of EACH pregnancy to help protect your baby from getting pertussis (whooping cough) 27-36 weeks is the BEST time to do  this so that you can pass the protection on to your baby. During pregnancy is better than after pregnancy, but if you are unable to get it during pregnancy it will be offered at the hospital.  You can get this vaccine with [place], at the health department, your family doctor, or some local pharmacies Everyone who will be around your baby should also be up-to-date on their vaccines before the baby comes. Adults (who are not pregnant) only need 1 dose of Tdap during adulthood.   [facility] Pediatricians/Family Doctors [facility]): [address] [place], [phone]           [facility]: [address], [phone]                [facility] Medicine [facility]): [address], [phone] (call to ask if accepting patients) [facility] Department: [address], [place], [phone]    [facility] Pediatricians/Family Doctors [facility]): [phone], [phone] [facility]: [address] [place], [phone] [facility]: [address], [phone]  [facility] Doctors  [facility]): [phone] [facility]: [address] [phone]   [facility] Doctors [facility]: [address], [phone]  [facility] Family Doctors  [facility]: [phone], [phone]  Home Blood Pressure Monitoring for Patients   Your provider has recommended that you check your  blood pressure (BP) at least once a week at home. If you do not have a blood pressure cuff at home, one will be provided for you. Contact your provider if you have not received your monitor within 1 week.  ° °Helpful Tips for Accurate Home Blood Pressure Checks  °Don't smoke, exercise, or drink caffeine 30 minutes before checking your BP °Use the restroom before checking your BP (a full bladder can raise your  pressure) °Relax in a comfortable upright chair °Feet on the ground °Left arm resting comfortably on a flat surface at the level of your heart °Legs uncrossed °Back supported °Sit quietly and don't talk °Place the cuff on your bare arm °Adjust snuggly, so that only two fingertips can fit between your skin and the top of the cuff °Check 2 readings separated by at least one minute °Keep a log of your BP readings °For a visual, please reference this diagram: http://ccnc.care/bpdiagram ° °Provider Name: [facility]     Phone: [phone] ° °Zone 1: ALL CLEAR  °Continue to monitor your symptoms:  °BP reading is less than 140 (top number) or less than 90 (bottom number)  °No right upper stomach pain °No headaches or seeing spots °No feeling nauseated or throwing up °No swelling in face and hands ° °Zone 2: CAUTION °Call your doctor's office for any of the following:  °BP reading is greater than 140 (top number) or greater than 90 (bottom number)  °Stomach pain under your ribs in the middle or right side °Headaches or seeing spots °Feeling nauseated or throwing up °Swelling in face and hands ° °Zone 3: EMERGENCY  °Seek immediate medical care if you have any of the following:  °BP reading is greater than160 (top number) or greater than 110 (bottom number) °Severe headaches not improving with Tylenol °Serious difficulty catching your breath °Any worsening symptoms from Zone 2  °Preterm Labor and Birth Information ° °The normal length of a pregnancy is 39-41 weeks. Preterm labor is when labor starts before 37 completed weeks of pregnancy. °What are the risk factors for preterm labor? °Preterm labor is more likely to occur in women who: °Have certain infections during pregnancy such as a bladder infection, sexually transmitted infection, or infection inside the uterus (chorioamnionitis). °Have a shorter-than-normal cervix. °Have gone into preterm labor before. °Have had surgery on their cervix. °Are younger than age 17  or older than age 35. °Are African American. °Are pregnant with twins or multiple babies (multiple gestation). °Take street drugs or smoke while pregnant. °Do not gain enough weight while pregnant. °Became pregnant shortly after having been pregnant. °What are the symptoms of preterm labor? °Symptoms of preterm labor include: °Cramps similar to those that can happen during a menstrual period. The cramps may happen with diarrhea. °Pain in the abdomen or lower back. °Regular uterine contractions that may feel like tightening of the abdomen. °A feeling of increased pressure in the pelvis. °Increased watery or bloody mucus discharge from the vagina. °Water breaking (ruptured amniotic sac). °Why is it important to recognize signs of preterm labor? °It is important to recognize signs of preterm labor because babies who are born prematurely may not be fully developed. This can put them at an increased risk for: °Long-term (chronic) heart and lung problems. °Difficulty immediately after birth with regulating body systems, including blood sugar, body temperature, heart rate, and breathing rate. °Bleeding in the brain. °Cerebral palsy. °Learning difficulties. °Death. °These risks are highest for babies who are born before 34 weeks   of pregnancy. How is preterm labor treated? Treatment depends on the length of your pregnancy, your condition, and the health of your baby. It may involve: Having a stitch (suture) placed in your cervix to prevent your cervix from opening too early (cerclage). Taking or being given medicines, such as: Hormone medicines. These may be given early in pregnancy to help support the pregnancy. Medicine to stop contractions. Medicines to help mature the babys lungs. These may be prescribed if the risk of delivery is high. Medicines to prevent your baby from developing cerebral palsy. If the labor happens before 34 weeks of pregnancy, you may need to stay in the hospital. What should I do if I  think I am in preterm labor? If you think that you are going into preterm labor, call your health care provider right away. How can I prevent preterm labor in future pregnancies? To increase your chance of having a full-term pregnancy: Do not use any tobacco products, such as cigarettes, chewing tobacco, and e-cigarettes. If you need help quitting, ask your health care provider. Do not use street drugs or medicines that have not been prescribed to you during your pregnancy. Talk with your health care provider before taking any herbal supplements, even if you have been taking them regularly. Make sure you gain a healthy amount of weight during your pregnancy. Watch for infection. If you think that you might have an infection, get it checked right away. Make sure to tell your health care provider if you have gone into preterm labor before. This information is not intended to replace advice given to you by your health care provider. Make sure you discuss any questions you have with your health care provider. Document Revised: 06/01/2018 Document Reviewed: 07/01/2015 Elsevier Patient Education  [place].

## 2021-03-16 NOTE — Progress Notes (Signed)
HIGH-RISK PREGNANCY VISIT Patient name: Brooke [name] MRN ID:[id number]  Date of birth: [birth year] Chief Complaint:   Routine Prenatal Visit (NST)  History of Present Illness:   Brooke [name] is a 36 y.o. G91P0030 female at [redacted]w[redacted]d with an Estimated Date of Delivery: 04/17/21 being seen today for ongoing management of a high-risk pregnancy complicated by diabetes mellitus A2/BDM currently on metformin 500mg  BID .    Today she reports  all sugars wnl except one 2hr pp 122 . Contractions: Not present. Vag. Bleeding: None.  Movement: Present. denies leaking of fluid.   Depression screen [facility] 2/9 01/12/2021 12/18/2020 10/14/2020 10/01/2019 04/12/2018  Decreased Interest 2 0 1 0 1  Down, Depressed, Hopeless 0 0 0 0 2  PHQ - 2 Score 2 0 1 0 3  Altered sleeping 1 - 0 1 2  Tired, decreased energy 1 - 1 3 2   Change in appetite 0 - 0 0 3  Feeling bad or failure about yourself  0 - 0 0 2  Trouble concentrating 0 - 0 0 1  Moving slowly or fidgety/restless 0 - 0 0 0  Suicidal thoughts 0 - 0 0 0  PHQ-9 Score 4 - 2 4 13   Difficult doing work/chores - - - Not difficult at all Somewhat difficult  Some recent data might be hidden     GAD 7 : Generalized Anxiety Score 01/12/2021 10/14/2020 10/01/2019  Nervous, Anxious, on Edge 0 0 0  Control/stop worrying 0 0 1  Worry too much - different things 0 1 1  Trouble relaxing 1 0 0  Restless 0 0 0  Easily annoyed or irritable 1 1 0  Afraid - awful might happen 0 0 0  Total GAD 7 Score 2 2 2   Anxiety Difficulty - - Not difficult at all     Review of Systems:   Pertinent items are noted in HPI Denies abnormal vaginal discharge w/ itching/odor/irritation, headaches, visual changes, shortness of breath, chest pain, abdominal pain, severe nausea/vomiting, or problems with urination or bowel movements unless otherwise stated above. Pertinent History Reviewed:  Reviewed past medical,surgical, social, obstetrical and family history.  Reviewed problem list,  medications and allergies. Physical Assessment:   Vitals:   03/16/21 1125  BP: 127/73  Pulse: 95  Weight: (!) 337 lb (152.9 kg)  Body mass index is 57.85 kg/m.           Physical Examination:   General appearance: alert, well appearing, and in no distress  Mental status: alert, oriented to person, place, and time  Skin: warm & dry   Extremities: Edema: Trace    Cardiovascular: normal heart rate noted  Respiratory: normal respiratory effort, no distress  Abdomen: gravid, soft, non-tender  Pelvic: Cervical exam deferred         Fetal Status: Fetal Heart Rate (bpm): 125   Movement: Present    Fetal Surveillance Testing today: NST: FHR baseline 125 bpm, Variability: moderate, Accelerations:present, Decelerations:  Absent= Cat 1/reactive Toco: none    Chaperone: N/A    Results for orders placed or performed in visit on 03/16/21 (from the past 24 hour(s))  POC Urinalysis Dipstick OB   Collection Time: 03/16/21 11:32 AM  Result Value Ref Range   Color, UA     Clarity, UA     Glucose, UA Negative Negative   Bilirubin, UA     Ketones, UA neg    Spec Grav, UA     Blood, UA neg    pH,  UA     POC,PROTEIN,UA Negative Negative, Trace, Small (1+), Moderate (2+), Large (3+), 4+   Urobilinogen, UA     Nitrite, UA neg    Leukocytes, UA Negative Negative   Appearance     Odor      Assessment & Plan:  High-risk pregnancy: G4P0030 at [redacted]w[redacted]d with an Estimated Date of Delivery: 04/17/21   1) A2/BDM, stable on metformin 500mg  BID, ASA  Meds: No orders of the defined types were placed in this encounter.   Labs/procedures today: NST  Treatment Plan:  Growth u/s q4wks       2x/wk testing    Deliver @ 39wks:_____   Reviewed: Preterm labor symptoms and general obstetric precautions including but not limited to vaginal bleeding, contractions, leaking of fluid and fetal movement were reviewed in detail with the patient.  All questions were answered. Does have home bp cuff. Office bp cuff  given: not applicable. Check bp weekly, let us know if consistently >140 and/or >90.  Follow-up: Return for As scheduled.   Future Appointments  Date Time Provider [place]  03/19/2021 11:10 AM [facility] NURSE [facility]  03/23/2021  1:30 PM [place] - [facility] [place] [facility] None  03/23/2021  2:30 PM Roma Schanz, CNM [facility]  03/26/2021 10:30 AM [facility] NURSE [facility]  03/30/2021 11:10 AM Roma Schanz, CNM [facility]  04/02/2021 11:30 AM [facility] NURSE [facility]  04/06/2021 11:10 AM Roma Schanz, CNM [facility]  04/09/2021 10:30 AM [facility] NURSE [facility]  04/13/2021 11:10 AM Roma Schanz, CNM [facility]    Orders Placed This Encounter  Procedures   US OB Follow Up   POC Urinalysis Dipstick OB   Roma Schanz  Attending Physician for the [facility] 03/16/2021 12:13 PM

## 2021-03-19 ENCOUNTER — Ambulatory Visit (INDEPENDENT_AMBULATORY_CARE_PROVIDER_SITE_OTHER): Payer: Medicaid Other | Admitting: *Deleted

## 2021-03-19 ENCOUNTER — Other Ambulatory Visit: Payer: Self-pay

## 2021-03-19 VITALS — BP 119/67 | HR 82 | Wt 338.4 lb

## 2021-03-19 DIAGNOSIS — Z3A35 35 weeks gestation of pregnancy: Secondary | ICD-10-CM | POA: Diagnosis not present

## 2021-03-19 DIAGNOSIS — O24415 Gestational diabetes mellitus in pregnancy, controlled by oral hypoglycemic drugs: Secondary | ICD-10-CM

## 2021-03-19 DIAGNOSIS — Z331 Pregnant state, incidental: Secondary | ICD-10-CM

## 2021-03-19 DIAGNOSIS — O288 Other abnormal findings on antenatal screening of mother: Secondary | ICD-10-CM

## 2021-03-19 DIAGNOSIS — O0990 Supervision of high risk pregnancy, unspecified, unspecified trimester: Secondary | ICD-10-CM

## 2021-03-19 DIAGNOSIS — Z1389 Encounter for screening for other disorder: Secondary | ICD-10-CM

## 2021-03-19 NOTE — Progress Notes (Signed)
° °  NURSE VISIT- NST  SUBJECTIVE:  Brooke [name] is a 36 y.o. G49P0030 female at [redacted]w[redacted]d, here for a NST for pregnancy complicated by A2/BDM currently on Metformin .  She reports active fetal movement, contractions: none, vaginal bleeding: none, membranes: intact.   OBJECTIVE:  BP 119/67    Pulse 82    Wt (!) 338 lb 6.4 oz (153.5 kg)    LMP 07/11/2020    BMI 58.09 kg/m   Appears well, no apparent distress  Pt unable to void  NST: FHR baseline 135 bpm, Variability: moderate, Accelerations:present, Decelerations:  Absent= Cat 1/reactive Toco: none   ASSESSMENT: G4P0030 at [redacted]w[redacted]d with A2/BDM currently on Metformin NST reactive  PLAN: EFM strip reviewed by Dr. Charlotta Newton   Recommendations: keep next appointment as scheduled    Jobe Marker  03/19/2021 12:17 PM

## 2021-03-22 ENCOUNTER — Other Ambulatory Visit: Payer: Self-pay | Admitting: Women's Health

## 2021-03-22 DIAGNOSIS — O0993 Supervision of high risk pregnancy, unspecified, third trimester: Secondary | ICD-10-CM

## 2021-03-22 DIAGNOSIS — O24415 Gestational diabetes mellitus in pregnancy, controlled by oral hypoglycemic drugs: Secondary | ICD-10-CM

## 2021-03-23 ENCOUNTER — Encounter: Payer: Self-pay | Admitting: Obstetrics & Gynecology

## 2021-03-23 ENCOUNTER — Other Ambulatory Visit (HOSPITAL_COMMUNITY)
Admission: RE | Admit: 2021-03-23 | Discharge: 2021-03-23 | Disposition: A | Discharge status: Home / Self Care | Payer: Medicaid Other | Source: Ambulatory Visit | Attending: Obstetrics & Gynecology | Admitting: Obstetrics & Gynecology

## 2021-03-23 ENCOUNTER — Ambulatory Visit (INDEPENDENT_AMBULATORY_CARE_PROVIDER_SITE_OTHER): Payer: Medicaid Other | Admitting: Obstetrics & Gynecology

## 2021-03-23 ENCOUNTER — Other Ambulatory Visit: Payer: Self-pay

## 2021-03-23 ENCOUNTER — Ambulatory Visit (INDEPENDENT_AMBULATORY_CARE_PROVIDER_SITE_OTHER): Payer: Medicaid Other

## 2021-03-23 VITALS — BP 128/88 | HR 97 | Wt 340.0 lb

## 2021-03-23 DIAGNOSIS — O24415 Gestational diabetes mellitus in pregnancy, controlled by oral hypoglycemic drugs: Secondary | ICD-10-CM

## 2021-03-23 DIAGNOSIS — O0993 Supervision of high risk pregnancy, unspecified, third trimester: Secondary | ICD-10-CM

## 2021-03-23 DIAGNOSIS — F1290 Cannabis use, unspecified, uncomplicated: Secondary | ICD-10-CM

## 2021-03-23 DIAGNOSIS — Z3A36 36 weeks gestation of pregnancy: Secondary | ICD-10-CM | POA: Insufficient documentation

## 2021-03-23 DIAGNOSIS — O09893 Supervision of other high risk pregnancies, third trimester: Secondary | ICD-10-CM | POA: Diagnosis present

## 2021-03-23 DIAGNOSIS — R8271 Bacteriuria: Secondary | ICD-10-CM

## 2021-03-23 DIAGNOSIS — O99213 Obesity complicating pregnancy, third trimester: Secondary | ICD-10-CM

## 2021-03-23 DIAGNOSIS — O0990 Supervision of high risk pregnancy, unspecified, unspecified trimester: Secondary | ICD-10-CM

## 2021-03-23 DIAGNOSIS — Z113 Encounter for screening for infections with a predominantly sexual mode of transmission: Secondary | ICD-10-CM

## 2021-03-23 LAB — OB RESULTS CONSOLE GC/CHLAMYDIA: Gonorrhea: NEGATIVE

## 2021-03-23 NOTE — Progress Notes (Signed)
HIGH-RISK PREGNANCY VISIT Patient name: Brooke [name] MRN ID:[id number]  Date of birth: 23-Apr-1985 Chief Complaint:   Routine Prenatal Visit  History of Present Illness:   Brooke [name] is a 36 y.o. G27P0030 female at [redacted]w[redacted]d with an Estimated Date of Delivery: 04/17/21 being seen today for ongoing management of a high-risk pregnancy complicated by Class B DM on metformin 500 BID.    Today she reports no complaints. Contractions: Not present. Vag. Bleeding: None.  Movement: Present. denies leaking of fluid.   Depression screen [facility] 2/9 01/12/2021 12/18/2020 10/14/2020 10/01/2019 04/12/2018  Decreased Interest 2 0 1 0 1  Down, Depressed, Hopeless 0 0 0 0 2  PHQ - 2 Score 2 0 1 0 3  Altered sleeping 1 - 0 1 2  Tired, decreased energy 1 - 1 3 2   Change in appetite 0 - 0 0 3  Feeling bad or failure about yourself  0 - 0 0 2  Trouble concentrating 0 - 0 0 1  Moving slowly or fidgety/restless 0 - 0 0 0  Suicidal thoughts 0 - 0 0 0  PHQ-9 Score 4 - 2 4 13   Difficult doing work/chores - - - Not difficult at all Somewhat difficult  Some recent data might be hidden     GAD 7 : Generalized Anxiety Score 01/12/2021 10/14/2020 10/01/2019  Nervous, Anxious, on Edge 0 0 0  Control/stop worrying 0 0 1  Worry too much - different things 0 1 1  Trouble relaxing 1 0 0  Restless 0 0 0  Easily annoyed or irritable 1 1 0  Afraid - awful might happen 0 0 0  Total GAD 7 Score 2 2 2   Anxiety Difficulty - - Not difficult at all     Review of Systems:   Pertinent items are noted in HPI Denies abnormal vaginal discharge w/ itching/odor/irritation, headaches, visual changes, shortness of breath, chest pain, abdominal pain, severe nausea/vomiting, or problems with urination or bowel movements unless otherwise stated above. Pertinent History Reviewed:  Reviewed past medical,surgical, social, obstetrical and family history.  Reviewed problem list, medications and allergies. Physical Assessment:    Vitals:   03/23/21 1412  BP: 128/88  Pulse: 97  Weight: (!) 340 lb (154.2 kg)  Body mass index is 58.36 kg/m.           Physical Examination:   General appearance: alert, well appearing, and in no distress  Mental status: alert, oriented to person, place, and time  Skin: warm & dry   Extremities: Edema: Trace    Cardiovascular: normal heart rate noted  Respiratory: normal respiratory effort, no distress  Abdomen: gravid, soft, non-tender  Pelvic: Cervical exam performed  Dilation: 2 Effacement (%): Thick Station: -3  Fetal Status: Fetal Heart Rate (bpm): 144 Fundal Height: 39 cm Movement: Present Presentation: Vertex  Fetal Surveillance Testing today: BPP 8/8   Chaperone: N/A    No results found for this or any previous visit (from the past 24 hour(s)).  Assessment & Plan:  High-risk pregnancy: G4P0030 at [redacted]w[redacted]d with an Estimated Date of Delivery: 04/17/21   1) Class A2 DM on metformin 500 BID, stable, good testing    Meds: No orders of the defined types were placed in this encounter.   Labs/procedures today: U/S  Treatment Plan:  twice weekly  Reviewed: Term labor symptoms and general obstetric precautions including but not limited to vaginal bleeding, contractions, leaking of fluid and fetal movement were reviewed in detail with the patient.  All questions were answered. Does not have home bp cuff. Office bp cuff given: yes. Check bp weekly, let us know if consistently >140 and/or >90.  Follow-up: Return for keep scheduled.   Future Appointments  Date Time Provider [place]  03/26/2021 10:30 AM [facility] NURSE [facility]  03/30/2021 11:10 AM Roma Schanz, CNM [facility]  04/02/2021 11:30 AM [facility] NURSE [facility]  04/06/2021 11:10 AM Roma Schanz, CNM [facility]  04/09/2021 10:30 AM [facility] NURSE [facility]  04/13/2021 11:10 AM Roma Schanz, CNM [facility]    Orders Placed This Encounter  Procedures    Culture, beta strep (group b only)   Florian Buff  Attending Physician for the [facility] [place] [facility] 03/23/2021 2:51 PM

## 2021-03-23 NOTE — Progress Notes (Signed)
[place] 36+3 wks,cephalic,anterior placenta gr 2,BPP 8/8,AFI 17.6 cm,FHR 144 bpm,EFW 3130 g 72%,AC 95%

## 2021-03-25 LAB — CERVICOVAGINAL ANCILLARY ONLY
Chlamydia: NEGATIVE
Comment: NEGATIVE
Comment: NORMAL
Neisseria Gonorrhea: NEGATIVE

## 2021-03-26 ENCOUNTER — Other Ambulatory Visit: Payer: Medicaid Other

## 2021-03-26 ENCOUNTER — Encounter: Payer: Self-pay | Admitting: Women's Health

## 2021-03-26 LAB — CULTURE, BETA STREP (GROUP B ONLY): Strep Gp B Culture: POSITIVE — AB

## 2021-03-29 ENCOUNTER — Encounter: Payer: Medicaid Other | Admitting: Women's Health

## 2021-03-30 ENCOUNTER — Ambulatory Visit (INDEPENDENT_AMBULATORY_CARE_PROVIDER_SITE_OTHER): Payer: Medicaid Other | Admitting: Women's Health

## 2021-03-30 ENCOUNTER — Encounter: Payer: Self-pay | Admitting: Women's Health

## 2021-03-30 ENCOUNTER — Other Ambulatory Visit: Payer: Self-pay

## 2021-03-30 VITALS — BP 106/70 | HR 83 | Wt 337.4 lb

## 2021-03-30 DIAGNOSIS — O0990 Supervision of high risk pregnancy, unspecified, unspecified trimester: Secondary | ICD-10-CM

## 2021-03-30 DIAGNOSIS — O24415 Gestational diabetes mellitus in pregnancy, controlled by oral hypoglycemic drugs: Secondary | ICD-10-CM

## 2021-03-30 DIAGNOSIS — Z3A37 37 weeks gestation of pregnancy: Secondary | ICD-10-CM

## 2021-03-30 DIAGNOSIS — O0993 Supervision of high risk pregnancy, unspecified, third trimester: Secondary | ICD-10-CM | POA: Diagnosis not present

## 2021-03-30 NOTE — Patient Instructions (Signed)
Brooke [name], thank you for choosing our office today! We appreciate the opportunity to meet your healthcare needs. You may receive a short survey by mail, e-mail, or through [employer]. If you are happy with your care we would appreciate if you could take just a few minutes to complete the survey questions. We read all of your comments and take your feedback very seriously. Thank you again for choosing our office.  Center for [employer] Team at [facility]  [facility] at [facility] ([address] [place], [place] [address]) Entrance C, located off of [address] Free 24/7 valet parking   CLASSES: Go to [email or web address] to register for classes (childbirth, breastfeeding, waterbirth, infant CPR, daddy bootcamp, etc.)  Call the office [phone]) or go to [facility] if: You begin to have strong, frequent contractions Your water breaks.  Sometimes it is a big gush of fluid, sometimes it is just a trickle that keeps getting your panties wet or running down your legs You have vaginal bleeding.  It is normal to have a small amount of spotting if your cervix was checked.  You don't feel your baby moving like normal.  If you don't, get you something to eat and drink and lay down and focus on feeling your baby move.   If your baby is still not moving like normal, you should call the office or go to [facility].  Call the office [phone]) or go to [facility] hospital for these signs of pre-eclampsia: Severe headache that does not go away with Tylenol Visual changes- seeing spots, double, blurred vision Pain under your right breast or upper abdomen that does not go away with Tums or heartburn medicine Nausea and/or vomiting Severe swelling in your hands, feet, and face   [place] Pediatricians/Family Doctors [facility]): [address] Colette Ribas, [phone]           [facility]: [address], [phone]                 [facility]): [address], [phone] (call to ask if accepting patients) [facility] Department: [address], [place], [phone]    [facility] Pediatricians/Family Doctors [facility]): [address], [phone] [facility]: [address] [place], [phone] [facility]: [address], [phone]  [facility] Doctors  [facility]): [phone] [facility]: [address] [phone]   [facility] Doctors [facility]: [address], [phone]  [facility] Doctors  [facility]: [phone], [phone]  Home Blood Pressure Monitoring for Patients   Your provider has recommended that you check your blood pressure (BP) at least once a week at home. If you do not have a blood pressure cuff at home, one will be provided for you. Contact your provider if you have not received your monitor within 1 week.   Helpful Tips for Accurate Home Blood Pressure Checks  Don't smoke, exercise, or drink caffeine 30 minutes before checking your BP Use the restroom before checking your BP (a full bladder can raise your pressure) Relax in a comfortable upright chair Feet on the ground Left arm resting comfortably on a flat surface at the level of your heart Legs uncrossed Back supported Sit quietly and don't talk Place the cuff on your bare arm Adjust snuggly, so that only two fingertips  can fit between your skin and the top of the cuff Check 2 readings separated by at least one minute Keep a log of your BP readings For a visual, please reference this diagram: http://ccnc.care/bpdiagram  Provider Name: [facility]     Phone: [phone]  Zone 1: ALL CLEAR  Continue to monitor your symptoms:  BP reading is less than 140 (top number) or less than 90 (bottom number)  No right  upper stomach pain No headaches or seeing spots No feeling nauseated or throwing up No swelling in face and hands  Zone 2: CAUTION Call your doctor's office for any of the following:  BP reading is greater than 140 (top number) or greater than 90 (bottom number)  Stomach pain under your ribs in the middle or right side Headaches or seeing spots Feeling nauseated or throwing up Swelling in face and hands  Zone 3: EMERGENCY  Seek immediate medical care if you have any of the following:  BP reading is greater than160 (top number) or greater than 110 (bottom number) Severe headaches not improving with Tylenol Serious difficulty catching your breath Any worsening symptoms from Zone 2   Braxton Hicks Contractions Contractions of the uterus can occur throughout pregnancy, but they are not always a sign that you are in labor. You may have practice contractions called Braxton Hicks contractions. These false labor contractions are sometimes confused with true labor. What are [place] contractions? Braxton Hicks contractions are tightening movements that occur in the muscles of the uterus before labor. Unlike true labor contractions, these contractions do not result in opening (dilation) and thinning of the cervix. Toward the end of pregnancy (32-34 weeks), Braxton Hicks contractions can happen more often and may become stronger. These contractions are sometimes difficult to tell apart from true labor because they can be very uncomfortable. You should not feel embarrassed if you go to the hospital with false labor. Sometimes, the only way to tell if you are in true labor is for your health care provider to look for changes in the cervix. The health care provider will do a physical exam and may monitor your contractions. If you are not in true labor, the exam should show that your cervix is not dilating and your water has not broken. If there are no other health problems associated with your  pregnancy, it is completely safe for you to be sent home with false labor. You may continue to have Braxton Hicks contractions until you go into true labor. How to tell the difference between true labor and false labor True labor Contractions last 30-70 seconds. Contractions become very regular. Discomfort is usually felt in the top of the uterus, and it spreads to the lower abdomen and low back. Contractions do not go away with walking. Contractions usually become more intense and increase in frequency. The cervix dilates and gets thinner. False labor Contractions are usually shorter and not as strong as true labor contractions. Contractions are usually irregular. Contractions are often felt in the front of the lower abdomen and in the groin. Contractions may go away when you walk around or change positions while lying down. Contractions get weaker and are shorter-lasting as time goes on. The cervix usually does not dilate or become thin. Follow these instructions at home:  Take over-the-counter and prescription medicines only as told by your health care provider. Keep up with your usual exercises and follow other instructions from your health care provider. Eat and drink lightly if you think  you are going into labor. If Braxton Hicks contractions are making you uncomfortable: Change your position from lying down or resting to walking, or change from walking to resting. Sit and rest in a tub of warm water. Drink enough fluid to keep your urine pale yellow. Dehydration may cause these contractions. Do slow and deep breathing several times an hour. Keep all follow-up prenatal visits as told by your health care provider. This is important. Contact a health care provider if: You have a fever. You have continuous pain in your abdomen. Get help right away if: Your contractions become stronger, more regular, and closer together. You have fluid leaking or gushing from your vagina. You pass  blood-tinged mucus (bloody show). You have bleeding from your vagina. You have low back pain that you never had before. You feel your babys head pushing down and causing pelvic pressure. Your baby is not moving inside you as much as it used to. Summary Contractions that occur before labor are called Braxton Hicks contractions, false labor, or practice contractions. Braxton Hicks contractions are usually shorter, weaker, farther apart, and less regular than true labor contractions. True labor contractions usually become progressively stronger and regular, and they become more frequent. Manage discomfort from [facility] contractions by changing position, resting in a warm bath, drinking plenty of water, or practicing deep breathing. This information is not intended to replace advice given to you by your health care provider. Make sure you discuss any questions you have with your health care provider. Document Revised: 01/20/2017 Document Reviewed: 06/23/2016 Elsevier Patient Education  [place].

## 2021-03-30 NOTE — Progress Notes (Signed)
HIGH-RISK PREGNANCY VISIT Patient name: Brooke [name] MRN [id number]  Date of birth: 30-Apr-1985 Chief Complaint:   High Risk Gestation (NST today; swelling in feet and hands)  History of Present Illness:   Brooke [name] is a 36 y.o. G62P0030 female at [redacted]w[redacted]d with an Estimated Date of Delivery: 04/17/21 being seen today for ongoing management of a high-risk pregnancy complicated by diabetes mellitus A2DM currently on metformin 500mg  BID .    Today she reports  all sugars wnl . Contractions: Irritability. Vag. Bleeding: None.  Movement: Present. denies leaking of fluid.   Depression screen [facility] 2/9 01/12/2021 12/18/2020 10/14/2020 10/01/2019 04/12/2018  Decreased Interest 2 0 1 0 1  Down, Depressed, Hopeless 0 0 0 0 2  PHQ - 2 Score 2 0 1 0 3  Altered sleeping 1 - 0 1 2  Tired, decreased energy 1 - 1 3 2   Change in appetite 0 - 0 0 3  Feeling bad or failure about yourself  0 - 0 0 2  Trouble concentrating 0 - 0 0 1  Moving slowly or fidgety/restless 0 - 0 0 0  Suicidal thoughts 0 - 0 0 0  PHQ-9 Score 4 - 2 4 13   Difficult doing work/chores - - - Not difficult at all Somewhat difficult  Some recent data might be hidden     GAD 7 : Generalized Anxiety Score 01/12/2021 10/14/2020 10/01/2019  Nervous, Anxious, on Edge 0 0 0  Control/stop worrying 0 0 1  Worry too much - different things 0 1 1  Trouble relaxing 1 0 0  Restless 0 0 0  Easily annoyed or irritable 1 1 0  Afraid - awful might happen 0 0 0  Total GAD 7 Score 2 2 2   Anxiety Difficulty - - Not difficult at all     Review of Systems:   Pertinent items are noted in HPI Denies abnormal vaginal discharge w/ itching/odor/irritation, headaches, visual changes, shortness of breath, chest pain, abdominal pain, severe nausea/vomiting, or problems with urination or bowel movements unless otherwise stated above. Pertinent History Reviewed:  Reviewed past medical,surgical, social, obstetrical and family history.  Reviewed problem  list, medications and allergies. Physical Assessment:   Vitals:   03/30/21 1132  BP: 106/70  Pulse: 83  Weight: (!) 337 lb 6.4 oz (153 kg)  Body mass index is 57.91 kg/m.           Physical Examination:   General appearance: alert, well appearing, and in no distress  Mental status: alert, oriented to person, place, and time  Skin: warm & dry   Extremities: Edema: Trace    Cardiovascular: normal heart rate noted  Respiratory: normal respiratory effort, no distress  Abdomen: gravid, soft, non-tender  Pelvic: Cervical exam performed  Dilation: 1.5 Effacement (%): 50 Station: -3  Fetal Status:     Movement: Present Presentation: Vertex  Fetal Surveillance Testing today: NST: FHR baseline 135 bpm, Variability: moderate, Accelerations:present, Decelerations:  Absent= Cat 1/reactive Toco: none    Chaperone: Levy Pupa    No results found for this or any previous visit (from the past 24 hour(s)).  Assessment & Plan:  High-risk pregnancy: G4P0030 at [redacted]w[redacted]d with an Estimated Date of Delivery: 04/17/21   1) A2DM, stable on metformin 500mg  BID, EFW 72% @ 36.3wk  Meds: No orders of the defined types were placed in this encounter.   Labs/procedures today: SVE and NST  Treatment Plan:  2x/wk NST   Deliver @ 39wks  Reviewed:  Term labor symptoms and general obstetric precautions including but not limited to vaginal bleeding, contractions, leaking of fluid and fetal movement were reviewed in detail with the patient.  All questions were answered. Does have home bp cuff. Office bp cuff given: not applicable. Check bp weekly, let us know if consistently >140 and/or >90.  Follow-up: Return for As scheduled.   Future Appointments  Date Time Provider [place]  04/02/2021 11:30 AM [facility] NURSE [facility]  04/06/2021 11:10 AM Roma Schanz, CNM [facility]  04/09/2021 10:30 AM [facility] NURSE [facility]  04/13/2021 11:10 AM Roma Schanz, CNM [facility]     No orders of the defined types were placed in this encounter.  Roma Schanz  Attending Physician for the Center for [place] [facility] 03/30/2021 11:50 AM

## 2021-04-02 ENCOUNTER — Other Ambulatory Visit: Payer: Self-pay

## 2021-04-02 ENCOUNTER — Ambulatory Visit (INDEPENDENT_AMBULATORY_CARE_PROVIDER_SITE_OTHER): Payer: Medicaid Other | Admitting: *Deleted

## 2021-04-02 VITALS — BP 107/72 | HR 85 | Wt 337.8 lb

## 2021-04-02 DIAGNOSIS — O24415 Gestational diabetes mellitus in pregnancy, controlled by oral hypoglycemic drugs: Secondary | ICD-10-CM

## 2021-04-02 DIAGNOSIS — O0990 Supervision of high risk pregnancy, unspecified, unspecified trimester: Secondary | ICD-10-CM | POA: Diagnosis not present

## 2021-04-02 DIAGNOSIS — O288 Other abnormal findings on antenatal screening of mother: Secondary | ICD-10-CM

## 2021-04-02 DIAGNOSIS — Z331 Pregnant state, incidental: Secondary | ICD-10-CM

## 2021-04-02 DIAGNOSIS — Z1389 Encounter for screening for other disorder: Secondary | ICD-10-CM

## 2021-04-02 NOTE — Progress Notes (Signed)
° °  NURSE VISIT- NST  SUBJECTIVE:  Brooke [name] is a 36 y.o. G32P0030 female at [redacted]w[redacted]d, here for a NST for pregnancy comA2/BDM currently on Metformin plicated by .  She reports active fetal movement, contractions: none, vaginal bleeding: none, membranes: intact.   OBJECTIVE:  BP 107/72    Pulse 85    Wt (!) 337 lb 12.8 oz (153.2 kg)    LMP 07/11/2020    BMI 57.98 kg/m   Appears well, no apparent distress  No results found for this or any previous visit (from the past 24 hour(s)).  NST: FHR baseline 135 bpm, Variability: moderate, Accelerations:present, Decelerations:  Absent= Cat 1/reactive Toco: none   ASSESSMENT: G4P0030 at [redacted]w[redacted]d with A2/BDM currently on Metformin NST reactive  PLAN: EFM strip reviewed by Dr. Charlotta Newton   Recommendations: keep next appointment as scheduled    Jobe Marker  04/02/2021 12:17 PM

## 2021-04-06 ENCOUNTER — Encounter: Payer: Self-pay | Admitting: Women's Health

## 2021-04-06 ENCOUNTER — Ambulatory Visit (INDEPENDENT_AMBULATORY_CARE_PROVIDER_SITE_OTHER): Payer: Medicaid Other | Admitting: Women's Health

## 2021-04-06 ENCOUNTER — Telehealth (HOSPITAL_COMMUNITY): Payer: Self-pay | Admitting: *Deleted

## 2021-04-06 ENCOUNTER — Other Ambulatory Visit: Payer: Self-pay

## 2021-04-06 VITALS — BP 120/80 | HR 94 | Wt 341.8 lb

## 2021-04-06 DIAGNOSIS — O0993 Supervision of high risk pregnancy, unspecified, third trimester: Secondary | ICD-10-CM | POA: Diagnosis not present

## 2021-04-06 DIAGNOSIS — O24415 Gestational diabetes mellitus in pregnancy, controlled by oral hypoglycemic drugs: Secondary | ICD-10-CM

## 2021-04-06 DIAGNOSIS — O0990 Supervision of high risk pregnancy, unspecified, unspecified trimester: Secondary | ICD-10-CM

## 2021-04-06 LAB — POCT URINALYSIS DIPSTICK OB
Blood, UA: NEGATIVE
Glucose, UA: NEGATIVE
Ketones, UA: NEGATIVE
Leukocytes, UA: NEGATIVE
Nitrite, UA: NEGATIVE
POC,PROTEIN,UA: NEGATIVE

## 2021-04-06 NOTE — Progress Notes (Signed)
HIGH-RISK PREGNANCY VISIT Patient name: Brooke [name] MRN [id number]  Date of birth: August 20, 1985 Chief Complaint:   Routine Prenatal Visit  History of Present Illness:   Brooke [name] is a 36 y.o. G34P0030 female at [redacted]w[redacted]d with an Estimated Date of Delivery: 04/17/21 being seen today for ongoing management of a high-risk pregnancy complicated by diabetes mellitus A2DM currently on metformin 500mg  BID .    Today she reports  blood sugars wnl, didn't bring log . Contractions: Irritability. Vag. Bleeding: None.  Movement: Present. denies leaking of fluid.   Depression screen [facility] 2/9 01/12/2021 12/18/2020 10/14/2020 10/01/2019 04/12/2018  Decreased Interest 2 0 1 0 1  Down, Depressed, Hopeless 0 0 0 0 2  PHQ - 2 Score 2 0 1 0 3  Altered sleeping 1 - 0 1 2  Tired, decreased energy 1 - 1 3 2   Change in appetite 0 - 0 0 3  Feeling bad or failure about yourself  0 - 0 0 2  Trouble concentrating 0 - 0 0 1  Moving slowly or fidgety/restless 0 - 0 0 0  Suicidal thoughts 0 - 0 0 0  PHQ-9 Score 4 - 2 4 13   Difficult doing work/chores - - - Not difficult at all Somewhat difficult  Some recent data might be hidden     GAD 7 : Generalized Anxiety Score 01/12/2021 10/14/2020 10/01/2019  Nervous, Anxious, on Edge 0 0 0  Control/stop worrying 0 0 1  Worry too much - different things 0 1 1  Trouble relaxing 1 0 0  Restless 0 0 0  Easily annoyed or irritable 1 1 0  Afraid - awful might happen 0 0 0  Total GAD 7 Score 2 2 2   Anxiety Difficulty - - Not difficult at all     Review of Systems:   Pertinent items are noted in HPI Denies abnormal vaginal discharge w/ itching/odor/irritation, headaches, visual changes, shortness of breath, chest pain, abdominal pain, severe nausea/vomiting, or problems with urination or bowel movements unless otherwise stated above. Pertinent History Reviewed:  Reviewed past medical,surgical, social, obstetrical and family history.  Reviewed problem list,  medications and allergies. Physical Assessment:   Vitals:   04/06/21 1140  BP: 120/80  Pulse: 94  Weight: (!) 341 lb 12.8 oz (155 kg)  Body mass index is 58.67 kg/m.           Physical Examination:   General appearance: alert, well appearing, and in no distress  Mental status: alert, oriented to person, place, and time  Skin: warm & dry   Extremities: Edema: Trace    Cardiovascular: normal heart rate noted  Respiratory: normal respiratory effort, no distress  Abdomen: gravid, soft, non-tender  Pelvic: Cervical exam deferred         Fetal Status: Fetal Heart Rate (bpm): 125   Movement: Present    Fetal Surveillance Testing today: NST: FHR baseline 125 bpm, Variability: moderate, Accelerations:present, Decelerations:  Absent= Cat 1/reactive Toco: none    Chaperone: N/A    Results for orders placed or performed in visit on 04/06/21 (from the past 24 hour(s))  POC Urinalysis Dipstick OB   Collection Time: 04/06/21 11:40 AM  Result Value Ref Range   Color, UA     Clarity, UA     Glucose, UA Negative Negative   Bilirubin, UA     Ketones, UA neg    Spec Grav, UA     Blood, UA neg    pH, UA  POC,PROTEIN,UA Negative Negative, Trace, Small (1+), Moderate (2+), Large (3+), 4+   Urobilinogen, UA     Nitrite, UA neg    Leukocytes, UA Negative Negative   Appearance     Odor      Assessment & Plan:  High-risk pregnancy: G4P0030 at [redacted]w[redacted]d with an Estimated Date of Delivery: 04/17/21   1) A2/BDM, stable on metformin 500mg  BID, IOL scheduled for 2/18,  IOL form faxed via Epic and orders placed   Meds: No orders of the defined types were placed in this encounter.  Labs/procedures today: NST  Treatment Plan:  IOL 2/18 @ [place]  Reviewed: Term labor symptoms and general obstetric precautions including but not limited to vaginal bleeding, contractions, leaking of fluid and fetal movement were reviewed in detail with the patient.  All questions were answered. Does have home bp cuff.  Office bp cuff given: not applicable. Check bp weekly, let us know if consistently >140 and/or >90.  Follow-up: Return for As scheduled 2/17, cancel 2/21.   Future Appointments  Date Time Provider [place]  04/09/2021 10:30 AM [facility] NURSE [facility]  04/10/2021 12:00 AM [facility] [place] [facility] None    Orders Placed This Encounter  Procedures   POC Urinalysis Dipstick OB   Roma Schanz  Attending Physician for the Center for [place] [facility] 04/06/2021 3:00 PM

## 2021-04-06 NOTE — Telephone Encounter (Signed)
Preadmission screen  

## 2021-04-06 NOTE — Patient Instructions (Addendum)
Brooke [name], thank you for choosing our office today! We appreciate the opportunity to meet your healthcare needs. You may receive a short survey by mail, e-mail, or through [employer]. If you are happy with your care we would appreciate if you could take just a few minutes to complete the survey questions. We read all of your comments and take your feedback very seriously. Thank you again for choosing our office.  Center for [employer] Team at [facility] ([place], [place] [id number]) Entrance C, located off of [address] parking   Your induction is scheduled for 2/18. You should be there on 2/17 at 11:45pm  Go to the main desk at the [facility] and let them know you are there to be induced. They will send someone from Labor & Delivery to come get you.  You will get a call from a nurse from the hospital within the next day or so to go over some information, she will also schedule you to go to the hospital a few days before you induction to have your Covid test. If you have any questions, please let us know.    CLASSES: Go to [email or web address] to register for classes (childbirth, breastfeeding, waterbirth, infant CPR, daddy bootcamp, etc.)  Call the office [phone]) or go to [facility] if: You begin to have strong, frequent contractions Your water breaks.  Sometimes it is a big gush of fluid, sometimes it is just a trickle that keeps getting your panties wet or running down your legs You have vaginal bleeding.  It is normal to have a small amount of spotting if your cervix was checked.  You don't feel your baby moving like normal.  If you don't, get you something to eat and drink and lay down and focus on feeling your baby move.   If your baby is still not moving like normal, you should call the office or go to [facility].  Call the office [phone]) or go to [facility] hospital for these signs of  pre-eclampsia: Severe headache that does not go away with Tylenol Visual changes- seeing spots, double, blurred vision Pain under your right breast or upper abdomen that does not go away with Tums or heartburn medicine Nausea and/or vomiting Severe swelling in your hands, feet, and face   [facility] Pediatricians/Family Doctors [facility]): [address] [place], [place]: [address] [place], [facility]): [place], [phone] (call to ask if accepting patients) [facility] Department: [address], [place], [facility]): [address], [place] [facility]: [address] [place], [place] [facility]: [place], [place] [facility]): [phone] [facility]: [address], [place]: [address], [place] [facility]: [phone], [phone]  Home Blood Pressure Monitoring for Patients   Your provider has recommended that you check your blood pressure (BP) at least once a week at home. If you do not have a blood pressure cuff at  home, one will be provided for you. Contact your provider if you have not received your monitor within 1 week.   Helpful Tips for Accurate Home Blood Pressure Checks  Don't smoke, exercise, or drink caffeine 30 minutes before checking your BP Use the restroom before checking your BP (a full bladder can raise your pressure) Relax in a comfortable upright chair Feet on the ground Left arm resting comfortably on a flat surface at the level of your heart Legs uncrossed Back supported Sit quietly  and don't talk Place the cuff on your bare arm Adjust snuggly, so that only two fingertips can fit between your skin and the top of the cuff Check 2 readings separated by at least one minute Keep a log of your BP readings For a visual, please reference this diagram: http://ccnc.care/bpdiagram  Provider Name: [facility]     Phone: [phone]  Zone 1: ALL CLEAR  Continue to monitor your symptoms:  BP reading is less than 140 (top number) or less than 90 (bottom number)  No right upper stomach pain No headaches or seeing spots No feeling nauseated or throwing up No swelling in face and hands  Zone 2: CAUTION Call your doctor's office for any of the following:  BP reading is greater than 140 (top number) or greater than 90 (bottom number)  Stomach pain under your ribs in the middle or right side Headaches or seeing spots Feeling nauseated or throwing up Swelling in face and hands  Zone 3: EMERGENCY  Seek immediate medical care if you have any of the following:  BP reading is greater than160 (top number) or greater than 110 (bottom number) Severe headaches not improving with Tylenol Serious difficulty catching your breath Any worsening symptoms from Zone 2   Braxton Hicks Contractions Contractions of the uterus can occur throughout pregnancy, but they are not always a sign that you are in labor. You may have practice contractions called Braxton Hicks contractions. These false labor contractions are sometimes confused with true labor. What are [place] contractions? Braxton Hicks contractions are tightening movements that occur in the muscles of the uterus before labor. Unlike true labor contractions, these contractions do not result in opening (dilation) and thinning of the cervix. Toward the end of pregnancy (32-34 weeks), Braxton Hicks contractions can happen more often and may become stronger. These contractions are sometimes difficult to tell apart from true labor  because they can be very uncomfortable. You should not feel embarrassed if you go to the hospital with false labor. Sometimes, the only way to tell if you are in true labor is for your health care provider to look for changes in the cervix. The health care provider will do a physical exam and may monitor your contractions. If you are not in true labor, the exam should show that your cervix is not dilating and your water has not broken. If there are no other health problems associated with your pregnancy, it is completely safe for you to be sent home with false labor. You may continue to have Braxton Hicks contractions until you go into true labor. How to tell the difference between true labor and false labor True labor Contractions last 30-70 seconds. Contractions become very regular. Discomfort is usually felt in the top of the uterus, and it spreads to the lower abdomen and low back. Contractions do not go away with walking. Contractions usually become more intense and increase in frequency. The cervix dilates and gets thinner. False labor Contractions  are usually shorter and not as strong as true labor contractions. Contractions are usually irregular. Contractions are often felt in the front of the lower abdomen and in the groin. Contractions may go away when you walk around or change positions while lying down. Contractions get weaker and are shorter-lasting as time goes on. The cervix usually does not dilate or become thin. Follow these instructions at home:  Take over-the-counter and prescription medicines only as told by your health care provider. Keep up with your usual exercises and follow other instructions from your health care provider. Eat and drink lightly if you think you are going into labor. If Braxton Hicks contractions are making you uncomfortable: Change your position from lying down or resting to walking, or change from walking to resting. Sit and rest in a tub of warm  water. Drink enough fluid to keep your urine pale yellow. Dehydration may cause these contractions. Do slow and deep breathing several times an hour. Keep all follow-up prenatal visits as told by your health care provider. This is important. Contact a health care provider if: You have a fever. You have continuous pain in your abdomen. Get help right away if: Your contractions become stronger, more regular, and closer together. You have fluid leaking or gushing from your vagina. You pass blood-tinged mucus (bloody show). You have bleeding from your vagina. You have low back pain that you never had before. You feel your babys head pushing down and causing pelvic pressure. Your baby is not moving inside you as much as it used to. Summary Contractions that occur before labor are called Braxton Hicks contractions, false labor, or practice contractions. Braxton Hicks contractions are usually shorter, weaker, farther apart, and less regular than true labor contractions. True labor contractions usually become progressively stronger and regular, and they become more frequent. Manage discomfort from [facility] contractions by changing position, resting in a warm bath, drinking plenty of water, or practicing deep breathing. This information is not intended to replace advice given to you by your health care provider. Make sure you discuss any questions you have with your health care provider. Document Revised: 01/20/2017 Document Reviewed: 06/23/2016 Elsevier Patient Education  [place].

## 2021-04-07 ENCOUNTER — Other Ambulatory Visit: Payer: Self-pay | Admitting: Advanced Practice Midwife

## 2021-04-07 ENCOUNTER — Telehealth (HOSPITAL_COMMUNITY): Payer: Self-pay | Admitting: *Deleted

## 2021-04-07 ENCOUNTER — Encounter (HOSPITAL_COMMUNITY): Payer: Self-pay | Admitting: *Deleted

## 2021-04-07 NOTE — Telephone Encounter (Signed)
Preadmission screen  

## 2021-04-09 ENCOUNTER — Other Ambulatory Visit: Payer: Self-pay

## 2021-04-09 ENCOUNTER — Ambulatory Visit (INDEPENDENT_AMBULATORY_CARE_PROVIDER_SITE_OTHER): Payer: Medicaid Other | Admitting: *Deleted

## 2021-04-09 DIAGNOSIS — O0990 Supervision of high risk pregnancy, unspecified, unspecified trimester: Secondary | ICD-10-CM

## 2021-04-09 DIAGNOSIS — O24415 Gestational diabetes mellitus in pregnancy, controlled by oral hypoglycemic drugs: Secondary | ICD-10-CM | POA: Diagnosis not present

## 2021-04-09 NOTE — Progress Notes (Signed)
° °  NURSE VISIT- NST  SUBJECTIVE:  Brooke [name] is a 37 y.o. G52P0030 female at [redacted]w[redacted]d, here for a NST for pregnancy complicated by A2/BDM currently on Metformin .  She reports active fetal movement, contractions: none, vaginal bleeding: none, membranes: intact.   OBJECTIVE:  BP 117/75    Pulse 88    Wt (!) 343 lb 9.6 oz (155.9 kg)    LMP 07/11/2020    BMI 58.98 kg/m   Appears well, no apparent distress  No results found for this or any previous visit (from the past 24 hour(s)).  NST: FHR baseline 130 bpm, Variability: moderate, Accelerations:present, Decelerations:  Absent= Cat 1/reactive Toco: none   ASSESSMENT: G4P0030 at [redacted]w[redacted]d with A2/BDM currently on Metformin NST reactive  PLAN: EFM strip reviewed by Joellyn Haff, CNM, [facility]   Recommendations: keep next appointment as scheduled    Jobe Marker  04/09/2021 11:52 AM

## 2021-04-10 ENCOUNTER — Encounter (HOSPITAL_COMMUNITY): Payer: Self-pay | Admitting: Obstetrics & Gynecology

## 2021-04-10 ENCOUNTER — Inpatient Hospital Stay (HOSPITAL_COMMUNITY): Payer: Medicaid Other | Admitting: Anesthesiology

## 2021-04-10 ENCOUNTER — Inpatient Hospital Stay (HOSPITAL_COMMUNITY)
Admission: AD | Admit: 2021-04-10 | Discharge: 2021-04-13 | DRG: 806 | Disposition: A | Discharge status: Home / Self Care | Payer: Medicaid Other | Attending: Obstetrics & Gynecology | Admitting: Obstetrics & Gynecology

## 2021-04-10 ENCOUNTER — Inpatient Hospital Stay (HOSPITAL_COMMUNITY): Payer: Medicaid Other

## 2021-04-10 DIAGNOSIS — Z7982 Long term (current) use of aspirin: Secondary | ICD-10-CM

## 2021-04-10 DIAGNOSIS — O99354 Diseases of the nervous system complicating childbirth: Secondary | ICD-10-CM | POA: Diagnosis present

## 2021-04-10 DIAGNOSIS — F1290 Cannabis use, unspecified, uncomplicated: Secondary | ICD-10-CM | POA: Diagnosis present

## 2021-04-10 DIAGNOSIS — O99214 Obesity complicating childbirth: Secondary | ICD-10-CM | POA: Diagnosis present

## 2021-04-10 DIAGNOSIS — O24419 Gestational diabetes mellitus in pregnancy, unspecified control: Secondary | ICD-10-CM

## 2021-04-10 DIAGNOSIS — O0990 Supervision of high risk pregnancy, unspecified, unspecified trimester: Secondary | ICD-10-CM

## 2021-04-10 DIAGNOSIS — O99324 Drug use complicating childbirth: Secondary | ICD-10-CM | POA: Diagnosis present

## 2021-04-10 DIAGNOSIS — Z87891 Personal history of nicotine dependence: Secondary | ICD-10-CM

## 2021-04-10 DIAGNOSIS — Z20822 Contact with and (suspected) exposure to covid-19: Secondary | ICD-10-CM | POA: Diagnosis present

## 2021-04-10 DIAGNOSIS — O24425 Gestational diabetes mellitus in childbirth, controlled by oral hypoglycemic drugs: Principal | ICD-10-CM | POA: Diagnosis present

## 2021-04-10 DIAGNOSIS — Z3A39 39 weeks gestation of pregnancy: Secondary | ICD-10-CM

## 2021-04-10 DIAGNOSIS — G35 Multiple sclerosis: Secondary | ICD-10-CM | POA: Diagnosis present

## 2021-04-10 DIAGNOSIS — O99824 Streptococcus B carrier state complicating childbirth: Secondary | ICD-10-CM | POA: Diagnosis present

## 2021-04-10 LAB — RESP PANEL BY RT-PCR (FLU A&B, COVID) ARPGX2
Influenza A by PCR: NEGATIVE
Influenza B by PCR: NEGATIVE
SARS Coronavirus 2 by RT PCR: NEGATIVE

## 2021-04-10 LAB — CBC
HCT: 32 % — ABNORMAL LOW (ref 36.0–46.0)
Hemoglobin: 11.1 g/dL — ABNORMAL LOW (ref 12.0–15.0)
MCH: 28.8 pg (ref 26.0–34.0)
MCHC: 34.7 g/dL (ref 30.0–36.0)
MCV: 83.1 fL (ref 80.0–100.0)
Platelets: 213 10*3/uL (ref 150–400)
RBC: 3.85 MIL/uL — ABNORMAL LOW (ref 3.87–5.11)
RDW: 14.5 % (ref 11.5–15.5)
WBC: 6.4 10*3/uL (ref 4.0–10.5)
nRBC: 0 % (ref 0.0–0.2)

## 2021-04-10 LAB — GLUCOSE, CAPILLARY
Glucose-Capillary: 138 mg/dL — ABNORMAL HIGH (ref 70–99)
Glucose-Capillary: 124 mg/dL — ABNORMAL HIGH (ref 70–99)
Glucose-Capillary: 113 mg/dL — ABNORMAL HIGH (ref 70–99)
Glucose-Capillary: 148 mg/dL — ABNORMAL HIGH (ref 70–99)
Glucose-Capillary: 139 mg/dL — ABNORMAL HIGH (ref 70–99)
Glucose-Capillary: 132 mg/dL — ABNORMAL HIGH (ref 70–99)

## 2021-04-10 LAB — TYPE AND SCREEN
ABO/RH(D): A POS
Antibody Screen: NEGATIVE

## 2021-04-10 LAB — RPR: RPR Ser Ql: NONREACTIVE

## 2021-04-10 MED ORDER — LIDOCAINE HCL (PF) 1 % IJ SOLN: 30.0000 mL | INTRAMUSCULAR | Status: DC | PRN

## 2021-04-10 MED ORDER — SODIUM CHLORIDE 0.9 % IV SOLN
5.0000 10*6.[IU] | Freq: Once | INTRAVENOUS | Status: AC
  Administered 2021-04-10: 5 10*6.[IU] via INTRAVENOUS
  Filled 2021-04-10: qty 5

## 2021-04-10 MED ORDER — OXYTOCIN-SODIUM CHLORIDE 30-0.9 UT/500ML-% IV SOLN
1.0000 m[IU]/min | INTRAVENOUS | Status: DC
  Administered 2021-04-10: 2 m[IU]/min via INTRAVENOUS

## 2021-04-10 MED ORDER — LACTATED RINGERS IV SOLN: INTRAVENOUS | Status: DC

## 2021-04-10 MED ORDER — MISOPROSTOL 25 MCG QUARTER TABLET: 25.0000 ug | ORAL_TABLET | ORAL | Status: DC | PRN

## 2021-04-10 MED ORDER — LACTATED RINGERS IV SOLN: 500.0000 mL | Freq: Once | INTRAVENOUS | Status: DC

## 2021-04-10 MED ORDER — OXYTOCIN-SODIUM CHLORIDE 30-0.9 UT/500ML-% IV SOLN
2.5000 [IU]/h | INTRAVENOUS | Status: DC
  Filled 2021-04-10: qty 500

## 2021-04-10 MED ORDER — EPHEDRINE 5 MG/ML INJ: 10.0000 mg | INTRAVENOUS | Status: DC | PRN

## 2021-04-10 MED ORDER — LACTATED RINGERS IV SOLN
500.0000 mL | Freq: Once | INTRAVENOUS | Status: AC
  Administered 2021-04-10: 500 mL via INTRAVENOUS

## 2021-04-10 MED ORDER — TERBUTALINE SULFATE 1 MG/ML IJ SOLN: 0.2500 mg | Freq: Once | INTRAMUSCULAR | Status: DC | PRN

## 2021-04-10 MED ORDER — ONDANSETRON HCL 4 MG/2ML IJ SOLN
4.0000 mg | Freq: Four times a day (QID) | INTRAMUSCULAR | Status: DC | PRN
  Administered 2021-04-10 – 2021-04-11 (×2): 4 mg via INTRAVENOUS
  Filled 2021-04-10 (×2): qty 2

## 2021-04-10 MED ORDER — FENTANYL CITRATE (PF) 100 MCG/2ML IJ SOLN
100.0000 ug | INTRAMUSCULAR | Status: DC | PRN
  Administered 2021-04-10 (×2): 100 ug via INTRAVENOUS
  Filled 2021-04-10 (×2): qty 2

## 2021-04-10 MED ORDER — PHENYLEPHRINE 40 MCG/ML (10ML) SYRINGE FOR IV PUSH (FOR BLOOD PRESSURE SUPPORT): 80.0000 ug | PREFILLED_SYRINGE | INTRAVENOUS | Status: DC | PRN

## 2021-04-10 MED ORDER — OXYCODONE-ACETAMINOPHEN 5-325 MG PO TABS: 2.0000 | ORAL_TABLET | ORAL | Status: DC | PRN

## 2021-04-10 MED ORDER — ACETAMINOPHEN 325 MG PO TABS: 650.0000 mg | ORAL_TABLET | ORAL | Status: DC | PRN

## 2021-04-10 MED ORDER — FLEET ENEMA 7-19 GM/118ML RE ENEM: 1.0000 | ENEMA | RECTAL | Status: DC | PRN

## 2021-04-10 MED ORDER — FENTANYL-BUPIVACAINE-NACL 0.5-0.125-0.9 MG/250ML-% EP SOLN
12.0000 mL/h | EPIDURAL | Status: DC | PRN
  Administered 2021-04-10: 12 mL/h via EPIDURAL
  Filled 2021-04-10: qty 250

## 2021-04-10 MED ORDER — LIDOCAINE-EPINEPHRINE (PF) 2 %-1:200000 IJ SOLN: INTRAMUSCULAR | Status: DC | PRN

## 2021-04-10 MED ORDER — INSULIN ASPART 100 UNIT/ML IJ SOLN
0.0000 [IU] | INTRAMUSCULAR | Status: DC
  Administered 2021-04-10: 2 [IU] via SUBCUTANEOUS

## 2021-04-10 MED ORDER — LIDOCAINE HCL (PF) 1 % IJ SOLN
INTRAMUSCULAR | Status: DC | PRN
  Administered 2021-04-10: 3 mL via EPIDURAL
  Administered 2021-04-10: 2 mL via EPIDURAL
  Administered 2021-04-10: 3 mL via EPIDURAL
  Administered 2021-04-10: 2 mL via EPIDURAL
  Administered 2021-04-10 (×2): 5 mL via EPIDURAL

## 2021-04-10 MED ORDER — MISOPROSTOL 50MCG HALF TABLET
50.0000 ug | ORAL_TABLET | ORAL | Status: DC | PRN
  Administered 2021-04-10: 50 ug via BUCCAL
  Filled 2021-04-10: qty 1

## 2021-04-10 MED ORDER — INSULIN ASPART 100 UNIT/ML IJ SOLN
0.0000 [IU] | INTRAMUSCULAR | Status: DC
  Administered 2021-04-10 (×2): 2 [IU] via SUBCUTANEOUS
  Administered 2021-04-10: 1 [IU] via SUBCUTANEOUS
  Administered 2021-04-10: 2 [IU] via SUBCUTANEOUS

## 2021-04-10 MED ORDER — PHENYLEPHRINE 40 MCG/ML (10ML) SYRINGE FOR IV PUSH (FOR BLOOD PRESSURE SUPPORT)
80.0000 ug | PREFILLED_SYRINGE | INTRAVENOUS | Status: DC | PRN
  Filled 2021-04-10: qty 10

## 2021-04-10 MED ORDER — OXYCODONE-ACETAMINOPHEN 5-325 MG PO TABS: 1.0000 | ORAL_TABLET | ORAL | Status: DC | PRN

## 2021-04-10 MED ORDER — DIPHENHYDRAMINE HCL 50 MG/ML IJ SOLN: 12.5000 mg | INTRAMUSCULAR | Status: DC | PRN

## 2021-04-10 MED ORDER — SOD CITRATE-CITRIC ACID 500-334 MG/5ML PO SOLN
30.0000 mL | ORAL | Status: DC | PRN
Start: 2021-04-10 — End: 2021-04-11

## 2021-04-10 MED ORDER — PENICILLIN G POT IN DEXTROSE 60000 UNIT/ML IV SOLN
3.0000 10*6.[IU] | INTRAVENOUS | Status: DC
  Administered 2021-04-10 (×4): 3 10*6.[IU] via INTRAVENOUS
  Filled 2021-04-10 (×4): qty 50

## 2021-04-10 MED ORDER — OXYTOCIN BOLUS FROM INFUSION
333.0000 mL | Freq: Once | INTRAVENOUS | Status: AC
  Administered 2021-04-11: 333 mL via INTRAVENOUS

## 2021-04-10 MED ORDER — LIDOCAINE-EPINEPHRINE (PF) 2 %-1:200000 IJ SOLN
INTRAMUSCULAR | Status: DC | PRN
  Administered 2021-04-10: 3 mL via EPIDURAL

## 2021-04-10 MED ORDER — LACTATED RINGERS IV SOLN: 500.0000 mL | INTRAVENOUS | Status: DC | PRN

## 2021-04-10 NOTE — Progress Notes (Signed)
Labor Progress Note Mackenzie Groom [place] is a 36 y.o. G4P0030 at [redacted]w[redacted]d presented for IOL due to A2BDM   S: Getting really uncomfortable with contractions. IUPC has fallen out and RN reports difficulty with FHT staying on.   O:  BP (!) 98/55    Pulse (!) 120    Temp 98 F (36.7 C) (Axillary)    Resp 18    Ht 5\' 4"  (1.626 m)    Wt (!) 156.2 kg    LMP 07/11/2020    BMI 59.10 kg/m  EFM: 130/mod/15x15/none  CVE: Dilation: 5 Effacement (%): 70 Station: -3 Presentation: Vertex Exam by:: Dr. [email or web address]   A&P: 37 y.o. G4P0030 [redacted]w[redacted]d  #Labor: Some progression since last check. Able to replace the IUPC, however had to re-attempt several times due to several contractions, so will keep abdominal FHT monitor for now and attempt to place FSE in the future if needed (cervix is still quite posterior with redundant cervical tissue that may be difficult to get placed). FHT has been Cat I.  #Pain: IV fent PRN, undecided on additional pain control  #FWB: Cat I  #GBS positive > PCN   #GDM: Stable. Cont to monitor q4 CBGs.   [redacted]w[redacted]d, DO 2:42 PM

## 2021-04-10 NOTE — Progress Notes (Signed)
Labor Progress Note Brooke [name] is a 36 y.o. G4P0030 at [redacted]w[redacted]d presented for IOL due to A2/BGDM   S: Starting to feel crampy but doing well.   O:  BP (!) 131/55    Pulse 86    Temp 98 F (36.7 C) (Axillary)    Resp 18    Ht 5\' 4"  (1.626 m)    Wt (!) 156.2 kg    LMP 07/11/2020    BMI 59.10 kg/m  EFM: 135/mod/15x15/none   CVE: Dilation: 4.5 Effacement (%): 70 Station: -3 Presentation: Vertex Exam by:: Dr. Higinio Plan   A&P: 36 y.o. G4P0030 [redacted]w[redacted]d  #Labor: Minimal progression since last check (AROM/pit at that time). RN having difficulty monitoring contractions, placed IUPC after verbal consent.  #Pain: PRN  #FWB: Cat I  #GBS positive> PCN   #A2BDM: Most recent CBG 113. Will monitor closely.   Patriciaann Clan, DO 12:56 PM

## 2021-04-10 NOTE — Anesthesia Preprocedure Evaluation (Signed)
Anesthesia Evaluation  Patient identified by MRN, date of birth, ID band Patient awake    Reviewed: Allergy & Precautions, NPO status , Patient's Chart, lab work & pertinent test results  Airway Mallampati: II  TM Distance: >3 FB Neck ROM: Full    Dental  (+) Teeth Intact, Dental Advisory Given   Pulmonary neg pulmonary ROS, former smoker,    Pulmonary exam normal breath sounds clear to auscultation       Cardiovascular negative cardio ROS Normal cardiovascular exam Rhythm:Regular Rate:Normal     Neuro/Psych PSYCHIATRIC DISORDERS Anxiety Depression Multiple sclerosis: LLE weakness 1 week ago, partially resolved    GI/Hepatic negative GI ROS, Neg liver ROS,   Endo/Other  diabetes, Type obesity  Renal/GU negative Renal ROS     Musculoskeletal negative musculoskeletal ROS (+)   Abdominal   Peds  Hematology  (+) Blood dyscrasia, anemia , Plt 213k   Anesthesia Other Findings Day of surgery medications reviewed with the patient.  Reproductive/Obstetrics (+) Pregnancy                             Anesthesia Physical Anesthesia Plan  ASA: 4  Anesthesia Plan: Epidural   Post-op Pain Management:    Induction:   PONV Risk Score and Plan: 2 and Treatment may vary due to age or medical condition  Airway Management Planned: Natural Airway  Additional Equipment:   Intra-op Plan:   Post-operative Plan:   Informed Consent: I have reviewed the patients History and Physical, chart, labs and discussed the procedure including the risks, benefits and alternatives for the proposed anesthesia with the patient or authorized representative who has indicated his/her understanding and acceptance.     Dental advisory given  Plan Discussed with:   Anesthesia Plan Comments: (Patient identified. Risks/Benefits/Options discussed with patient including but not limited to bleeding, infection,  nerve damage, paralysis, failed block, incomplete pain control, headache, blood pressure changes, nausea, vomiting, reactions to medication both or allergic, itching and postpartum back pain. Confirmed with bedside nurse the patient's most recent platelet count. Confirmed with patient that they are not currently taking any anticoagulation, have any bleeding history or any family history of bleeding disorders. Patient expressed understanding and wished to proceed. All questions were answered. )        Anesthesia Quick Evaluation

## 2021-04-10 NOTE — Progress Notes (Signed)
Labor Progress Note Brooke [name] is a 36 y.o. G4P0030 at [redacted]w[redacted]d presented for IOL due to A2/BDM   S: Went to bedside to check in with difficulty tracing FHT due to maternal discomfort. Patient is very uncomfortable with contractions and feeling more pressure.   O:  BP 121/69    Pulse 90    Temp 98.2 F (36.8 C) (Oral)    Resp 18    Ht 5\' 4"  (1.626 m)    Wt (!) 156.2 kg    LMP 07/11/2020    BMI 59.10 kg/m  EFM: 145/mod/none/none  CVE: Dilation: 5 Effacement (%): 70 Station: -3 Presentation: Vertex Exam by:: Dr. Higinio Plan   A&P: 36 y.o. G4P0030 [redacted]w[redacted]d  #Labor: Checked cervix twice > 30 minutes apart due to patient feeling extremely uncomfortable and pressure/urge to poop. Cervix still high and about 5 cm, difficult exam due to discomfort. After discussion, patient would like to proceed with epidural placement.  #Pain: Epidural  #FWB: Cat I (when able to trace)  #GBS positive> PCN   #GDM: CBG 148 on recent check. Given SSI.   #MS: Reports she will intermittently experience difficulty with balance and lower extremity weakness (mainly L leg), has experienced this in the last week or so (but improved). Sent a message to her neurologist Dr Felecia Shelling about getting her follow up in their clinic postpartum +/- starting back on medication after delivery.   Patriciaann Clan, DO 4:29 PM

## 2021-04-10 NOTE — Progress Notes (Signed)
Labor Progress Note   Called by RN due to difficult to trace FHT and contractions. Patient now has second epidural (first one did not work) and is very comfortable.   After verbal consent, placed IUPC (myself) and FSE by Mellody Dance, RN. Patient tolerated well. Cervix still about 5.5/80/-2, station has improved from checks earlier today. Pit restarted around 0715, plan to titrate this up as needed.   FHT currently Cat I: baseline 145/mod/none/none. One deceleration noted a few minutes after IUPC in the setting of prolonged supine position, immediately resolved as we sat her up.   Will monitor closely. Hopeful that patient can rest now that she is comfortable.  Allayne Stack, DO

## 2021-04-10 NOTE — Progress Notes (Signed)
Brooke [name] is a 36 y.o. G4P0030 at [redacted]w[redacted]d admitted for induction of labor due to A2/BGDM.  Subjective: Reports feeling more pressure. Starting to become constant. Objective: BP 114/67    Pulse 98    Temp 99.3 F (37.4 C) (Axillary)    Resp 17    Ht 5\' 4"  (1.626 m)    Wt (!) 156.2 kg    LMP 07/11/2020    SpO2 99%    BMI 59.10 kg/m  No intake/output data recorded. No intake/output data recorded.  FHT:  FHR: 150 bpm, variability: moderate,  accelerations:  Abscent,  decelerations:  Absent UC:   regular, every 1-3 minutes, MVU ~220 SVE:   Dilation: 8 Effacement (%): 80, 90 Station: -2 Exam by:: West Pugh, RN  Labs: Lab Results  Component Value Date   WBC 6.4 04/10/2021   HGB 11.1 (L) 04/10/2021   HCT 32.0 (L) 04/10/2021   MCV 83.1 04/10/2021   PLT 213 04/10/2021    Assessment / Plan: [id number] at [redacted]w[redacted]d admitted for induction of labor due to A2/BGDM.  Labor: Progressing normally since pitocin restarted. Cervix now 8 cm. MVUs adequate. Continue pitocin at current dose as adequate MVUs. If contractions space out or MVUs decrease can uptitrate. Will recheck in 3-4 hours or sooner if clinically indicated.   Fetal Wellbeing:  Category I Pain Control:  Epidural I/D:   GBS pos>PCN   #A2/BGDM BG in 110s-130s. On SSI. Will switch to q2hrs now that more active labor.  Renard Matter, MD, MPH OB Fellow, Faculty Practice

## 2021-04-10 NOTE — Anesthesia Procedure Notes (Addendum)
Epidural Patient location during procedure: OB Start time: 04/10/2021 4:24 PM End time: 04/10/2021 4:41 PM  Staffing Anesthesiologist: Santa Lighter, MD Performed: anesthesiologist   Preanesthetic Checklist Completed: patient identified, IV checked, risks and benefits discussed, monitors and equipment checked, pre-op evaluation and timeout performed  Epidural Patient position: sitting Prep: DuraPrep Patient monitoring: blood pressure and continuous pulse ox Approach: midline Location: L3-L4 Injection technique: LOR air  Needle:  Needle type: Tuohy  Needle gauge: 17 G Needle length: 15 cm Needle insertion depth: 10 cm Catheter size: 19 Gauge Catheter at skin depth: 15 cm Test dose: negative and Other (1% Lidocaine)  Additional Notes Patient identified.  Risk benefits discussed including failed block, incomplete pain control, headache, nerve damage, paralysis, blood pressure changes, nausea, vomiting, reactions to medication both toxic or allergic, and postpartum back pain.  Patient expressed understanding and wished to proceed.  All questions were answered.  Sterile technique used throughout procedure and epidural site dressed with sterile barrier dressing. No paresthesia or other complications noted. The patient did not experience any signs of intravascular injection such as tinnitus or metallic taste in mouth nor signs of intrathecal spread such as rapid motor block. Please see nursing notes for vital signs.  **See intraop quick note for first attempt with +blood aspiration.  2nd attempt 1 level higher with LOR, negative aspiration, negative test dose.** **See quick note at 1753 on 04/10/21 for detailed regarding epidural replacement.** Reason for block:procedure for pain

## 2021-04-10 NOTE — H&P (Addendum)
OBSTETRIC ADMISSION HISTORY AND PHYSICAL  Brooke [name] is a 36 y.o. female G65P0030 with IUP at 67w0dby LMP presenting for IOL for A2/B GDM. She reports +FMs, No LOF, no VB, no blurry vision, headaches or peripheral edema, and RUQ pain.  She plans on breast and formula feeding. She request outpatient Liletta for birth control. [facility]  Dating: By LMP --->  Estimated Date of Delivery: 04/17/21  Sono:   @[redacted]w[redacted]d , CWD, normal anatomy, cephalic presentation, anterior placental lie, 3130g, 72% EFW  with AC 95%ile   Prenatal History/Complications:  AF6/BGDM on Metformin Hx of MS - not currently on medications   Past Medical History: Past Medical History:  Diagnosis Date   Anxiety    Chlamydia 02/28/2020   Treated 02/28/20 POC________   Chronic back pain    Depression    [place] mellitus without complication ([place]    Edema    Gestational diabetes    MS (multiple sclerosis) ([place] 09/29/2014    Past Surgical History: Past Surgical History:  Procedure Laterality Date   APPENDECTOMY     CHOLECYSTECTOMY N/A 10/07/2013   Procedure: LAPAROSCOPIC CHOLECYSTECTOMY;  Surgeon: MJamesetta So MD;  Location: [facility];  Service: General;  Laterality: N/A;   LAPAROSCOPIC APPENDECTOMY N/A 05/12/2015   Procedure: APPENDECTOMY LAPAROSCOPIC;  Surgeon: MAviva Signs MD;  Location: [facility];  Service: General;  Laterality: N/A;   WISDOM TOOTH EXTRACTION      Obstetrical History: OB History     Gravida  4   Para      Term      Preterm      AB  3   Living  0      SAB  1   IAB      Ectopic  2   Multiple      Live Births              Social History Social History   Socioeconomic History   Marital status: Single    Spouse name: Not on file   Number of children: Not on file   Years of education: 2 y colleg   Highest education level: Not on file  Occupational History   Occupation: work  Tobacco Use   Smoking status: Former     Types: Cigarettes, E-cigarettes    Quit date: 09/16/2007    Years since quitting: 13.5   Smokeless tobacco: Never  Vaping Use   Vaping Use: Former  Substance and Sexual Activity   Alcohol use: Not Currently   Drug use: Not Currently    Types: Marijuana    Comment: occ   Sexual activity: Yes    Birth control/protection: None  Other Topics Concern   Not on file  Social History Narrative   Drinks 1-2 large glasses of caffeine daily.      Social Determinants of Health   Financial Resource Strain: Low Risk    Difficulty of Paying Living Expenses: Not hard at all  Food Insecurity: No Food Insecurity   Worried About RCharity fundraiserin the Last Year: Never true   RPine Lakein the Last Year: Never true  Transportation Needs: No Transportation Needs   Lack of Transportation (Medical): No   Lack of Transportation (Non-Medical): No  Physical Activity: Insufficiently Active   Days of Exercise per Week: 2 days   Minutes of Exercise per Session: 20 min  Stress: No Stress Concern Present   Feeling  of Stress : Not at all  Social Connections: Moderately Integrated   Frequency of Communication with Friends and Family: More than three times a week   Frequency of Social Gatherings with Friends and Family: Three times a week   Attends Religious Services: More than 4 times per year   Active Member of Clubs or Organizations: Yes   Attends Archivist Meetings: 1 to 4 times per year   Marital Status: Widowed    Family History: Family History  Problem Relation Age of Onset   Diabetes Mother    Colon polyps Mother        hx of cancer   Other Mother        DDD [place], cervical   Sleep apnea Father     Allergies: Allergies  Allergen Reactions   Lexapro [Escitalopram Oxalate] Other (See Comments)    Suicidal thoughts, lowered libido   Tysabri [Natalizumab] Hives    Hives, itching, elevated HR and BP.   Latex Hives    Medications Prior to Admission  Medication Sig  Dispense Refill Last Dose   aspirin 81 MG chewable tablet Chew 2 tablets (162 mg total) by mouth daily. 60 tablet 8 04/09/2021   metFORMIN (GLUCOPHAGE) 500 MG tablet Take 1 tablet (500 mg total) by mouth 2 (two) times daily with a meal. 60 tablet 3 04/09/2021   Prenatal Vit-Fe Fumarate-FA (PNV PRENATAL PLUS MULTIVITAMIN) 27-1 MG TABS Take 1 daily 30 tablet 12 04/09/2021   ACCU-CHEK GUIDE test strip CHECK SUGAR 4 TIMES DAILY 100 strip 8    Accu-Chek Softclix Lancets lancets USE AS INSTRUCTED 100 each 12    Acetaminophen (TYLENOL PO) Take by mouth. As needed for knee pain.      Blood Glucose Monitoring Suppl (ACCU-CHEK GUIDE ME) w/Device KIT 1 each by Does not apply route 4 (four) times daily. 1 kit 0    Blood Pressure Monitor MISC For regular home bp monitoring during pregnancy 1 each 0    omeprazole (PRILOSEC) 10 MG capsule Take 1 capsule (10 mg total) by mouth daily. 30 capsule 3      Review of Systems   All systems reviewed and negative except as stated in HPI  Blood pressure 112/63, pulse (!) 103, temperature 98.2 F (36.8 C), temperature source Oral, resp. rate 17, height 5' 4"  (1.626 m), weight (!) 156.2 kg, last menstrual period 07/11/2020. General appearance: alert Lungs: clear to auscultation bilaterally Heart: regular rate and rhythm Abdomen: soft, non-tender; bowel sounds normal Extremities: Homans sign is negative, no sign of DVT Presentation: cephalic (confirmed with BSUS) Fetal monitoringBaseline: 130 bpm, Variability: Good {> 6 bpm), Accelerations: Reactive, and Decelerations: Absent Uterine activity irregular Dilation: 2 Effacement (%): 50 Station: -1 Exam by:: Cy Blamer, MD   Prenatal labs: ABO, Rh: --/--/A POS (02/18 0100) Antibody: NEG (02/18 0100) Rubella: 2.48 (08/24 1339) RPR: Non Reactive (11/22 0821)  HBsAg: Negative (08/24 1339)  HIV: Non Reactive (11/22 4696)  GBS: Positive/-- (01/31 1500)  2 hr Glucola abnormal Genetic screening  LR female Anatomy US  normal  Prenatal Transfer Tool  Maternal Diabetes: Yes:  Diabetes Type:  Insulin/Medication controlled Genetic Screening: Normal Maternal Ultrasounds/Referrals: Normal Fetal Ultrasounds or other Referrals:  None Maternal Substance Abuse:  Yes:  Type: Marijuana, THC positive previously Significant Maternal Medications:  Meds include: Other: Metformin Significant Maternal Lab Results: Group B Strep positive>PCN  Results for orders placed or performed during the hospital encounter of 04/10/21 (from the past 24 hour(s))  Type and screen  Collection Time: 04/10/21  1:00 AM  Result Value Ref Range   ABO/RH(D) A POS    Antibody Screen NEG    Sample Expiration      04/13/2021,2359 Performed at [facility], [place] [address] [place], [place] [address]   CBC   Collection Time: 04/10/21  1:07 AM  Result Value Ref Range   WBC 6.4 4.0 - 10.5 K/uL   RBC 3.85 (L) 3.87 - 5.11 MIL/uL   Hemoglobin 11.1 (L) 12.0 - 15.0 g/dL   HCT 32.0 (L) 36.0 - 46.0 %   MCV 83.1 80.0 - 100.0 fL   MCH 28.8 26.0 - 34.0 pg   MCHC 34.7 30.0 - 36.0 g/dL   RDW 14.5 11.5 - 15.5 %   Platelets 213 150 - 400 K/uL   nRBC 0.0 0.0 - 0.2 %  Resp Panel by RT-PCR (Flu A&B, Covid) Nasopharyngeal Swab   Collection Time: 04/10/21  1:25 AM   Specimen: Nasopharyngeal Swab; Nasopharyngeal(NP) swabs in vial transport medium  Result Value Ref Range   SARS Coronavirus 2 by RT PCR NEGATIVE NEGATIVE   Influenza A by PCR NEGATIVE NEGATIVE   Influenza B by PCR NEGATIVE NEGATIVE  Glucose, capillary   Collection Time: 04/10/21  1:26 AM  Result Value Ref Range   Glucose-Capillary 138 (H) 70 - 99 mg/dL    Patient Active Problem List   Diagnosis Date Noted   Gestational diabetes mellitus, class A2 04/10/2021   Gestational diabetes mellitus (GDM), antepartum 10/22/2020   Marijuana use 10/19/2020   Asymptomatic bacteriuria during pregnancy 10/19/2020   Supervision of high risk pregnancy, antepartum 36/46/8032   Obesity  complicating pregnancy 02/13/8249   History of chlamydia 03/31/2020   Vitamin D deficiency 01/07/2020   High risk medication use 07/03/2019   Right tubal pregnancy without intrauterine pregnancy 01/31/2019   Gait disturbance 02/07/2018   Left leg weakness 11/07/2017   Leg numbness 11/07/2017   Other fatigue 11/07/2017   Acute appendicitis 05/12/2015   Multiple sclerosis ([place]) 09/29/2014   Positive ANA (antinuclear antibody) 07/02/2014   Abnormal MRI of head 04/22/2014   Transient diplopia 04/22/2014   Ataxia 04/22/2014   Spell of memory loss 04/22/2014   Recurrent falls while walking 04/22/2014    Assessment/Plan:  Shalin Linders [place]ell is a 36 y.o. G4P0030 at 26w0dhere for IOL for A2GDM on Metformin  #Labor: cervix 2 cm on 1/31. At admission continues to be 2 cm, 50% effaced. Cooks balloon placed. And will give Cytotec 50 mcg buccal. Once balloon out will assess for AROM.  #Pain: Plans unmedicated #FWB: Cat I  #ID:  GBS pos>PCN #MOF: Breast and formula #MOC: plans outpatient IUD #Circ:  N/A   #A2GDM Patient with A2GDM. EFW 3130 72%ile AC 95%ile. Normal AFI.  - Will do q4hr BG. First check 138. Will place sliding scale orders. If needing repeating sliding scale dosing will plan for endotool.  #History of Multiple Sclerosis Reports was previously on medication (Tecfidera, Vesicare, and Gabapentin) but stopped during pregnancy. Has neurologist and planning follow up post delivery. Last MRI in 07/2019. She reports her sx usually include ocular symptoms and lower extremity weakness. [facility] in 07/2020   and per note at that time with symptoms of gait disturbance and left leg weakness. - consider neuro consult if symptoms appear - follow up with neurologist post partum in outpatient office  ARenard Matter MD  04/10/2021, 3:28 AM

## 2021-04-11 ENCOUNTER — Encounter (HOSPITAL_COMMUNITY): Payer: Self-pay | Admitting: Obstetrics & Gynecology

## 2021-04-11 DIAGNOSIS — Z3A39 39 weeks gestation of pregnancy: Secondary | ICD-10-CM

## 2021-04-11 DIAGNOSIS — O24419 Gestational diabetes mellitus in pregnancy, unspecified control: Secondary | ICD-10-CM

## 2021-04-11 DIAGNOSIS — O99824 Streptococcus B carrier state complicating childbirth: Secondary | ICD-10-CM

## 2021-04-11 MED ORDER — ONDANSETRON HCL 4 MG/2ML IJ SOLN: 4.0000 mg | INTRAMUSCULAR | Status: DC | PRN

## 2021-04-11 MED ORDER — ACETAMINOPHEN 325 MG PO TABS
650.0000 mg | ORAL_TABLET | ORAL | Status: DC | PRN
  Administered 2021-04-11 – 2021-04-13 (×9): 650 mg via ORAL
  Filled 2021-04-11 (×9): qty 2

## 2021-04-11 MED ORDER — MEDROXYPROGESTERONE ACETATE 150 MG/ML IM SUSP: 150.0000 mg | INTRAMUSCULAR | Status: DC | PRN

## 2021-04-11 MED ORDER — TETANUS-DIPHTH-ACELL PERTUSSIS 5-2.5-18.5 LF-MCG/0.5 IM SUSY: 0.5000 mL | PREFILLED_SYRINGE | Freq: Once | INTRAMUSCULAR | Status: DC

## 2021-04-11 MED ORDER — MEASLES, MUMPS & RUBELLA VAC IJ SOLR: 0.5000 mL | Freq: Once | INTRAMUSCULAR | Status: DC

## 2021-04-11 MED ORDER — BENZOCAINE-MENTHOL 20-0.5 % EX AERO
1.0000 "application " | INHALATION_SPRAY | CUTANEOUS | Status: DC | PRN
  Filled 2021-04-11 (×2): qty 56

## 2021-04-11 MED ORDER — DIPHENHYDRAMINE HCL 25 MG PO CAPS
25.0000 mg | ORAL_CAPSULE | Freq: Four times a day (QID) | ORAL | Status: DC | PRN
Start: 2021-04-11 — End: 2021-04-13

## 2021-04-11 MED ORDER — WITCH HAZEL-GLYCERIN EX PADS
1.0000 "application " | MEDICATED_PAD | CUTANEOUS | Status: DC | PRN
  Administered 2021-04-11: 1 via TOPICAL

## 2021-04-11 MED ORDER — SIMETHICONE 80 MG PO CHEW: 80.0000 mg | CHEWABLE_TABLET | ORAL | Status: DC | PRN

## 2021-04-11 MED ORDER — PRENATAL MULTIVITAMIN CH
1.0000 | ORAL_TABLET | Freq: Every day | ORAL | Status: DC
  Administered 2021-04-11 – 2021-04-13 (×3): 1 via ORAL
  Filled 2021-04-11 (×3): qty 1

## 2021-04-11 MED ORDER — IBUPROFEN 600 MG PO TABS
600.0000 mg | ORAL_TABLET | Freq: Four times a day (QID) | ORAL | Status: DC
  Administered 2021-04-11 – 2021-04-13 (×10): 600 mg via ORAL
  Filled 2021-04-11 (×10): qty 1

## 2021-04-11 MED ORDER — ONDANSETRON HCL 4 MG PO TABS: 4.0000 mg | ORAL_TABLET | ORAL | Status: DC | PRN

## 2021-04-11 MED ORDER — COCONUT OIL OIL: 1.0000 "application " | TOPICAL_OIL | Status: DC | PRN

## 2021-04-11 MED ORDER — SENNOSIDES-DOCUSATE SODIUM 8.6-50 MG PO TABS
2.0000 | ORAL_TABLET | Freq: Every day | ORAL | Status: DC
  Administered 2021-04-12 – 2021-04-13 (×2): 2 via ORAL
  Filled 2021-04-11 (×3): qty 2

## 2021-04-11 MED ORDER — DIBUCAINE (PERIANAL) 1 % EX OINT: 1.0000 "application " | TOPICAL_OINTMENT | CUTANEOUS | Status: DC | PRN

## 2021-04-11 NOTE — Anesthesia Postprocedure Evaluation (Signed)
Anesthesia Post Note  Patient: Clay L Ulysse  Procedure(s) Performed: AN AD HOC LABOR EPIDURAL     Patient location during evaluation: Mother Baby Anesthesia Type: Epidural Level of consciousness: awake and alert and oriented Pain management: satisfactory to patient Vital Signs Assessment: post-procedure vital signs reviewed and stable Respiratory status: respiratory function stable Cardiovascular status: stable Postop Assessment: no headache, no backache, epidural receding, patient able to bend at knees, no signs of nausea or vomiting, adequate PO intake and able to ambulate Anesthetic complications: no   No notable events documented.  Last Vitals:  Vitals:   04/11/21 0458 04/11/21 0945  BP: (!) 113/56 108/60  Pulse: 80 80  Resp: 20 18  Temp: 37 C 36.8 C  SpO2:  100%    Last Pain:  Vitals:   04/11/21 1138  TempSrc:   PainSc: 3    Pain Goal: Patients Stated Pain Goal: 2 (04/11/21 1138)                 Khamryn Calderone

## 2021-04-11 NOTE — Lactation Note (Signed)
This note was copied from a baby's chart. Lactation Consultation Note Mom stated she is putting the baby to the breast STS before giving formula. Mom doesn't feel like she has much colostrum so she is giving formula that way she knows she's getting something. Mom stated the baby is very sleepy and not doing much at the breast. Discussed mom having GDM w/pregnancy to watch for s/sx hypoglycemia w/baby. Newborn feeding habits, STS, I&O, supply and demand discussed. Mentioned support group as well as LC OP services when gave mom Lactation brochure. Encouraged to call if needs assistance.  Patient Name: Brooke [name] [id number] Date: 04/11/2021 Reason for consult: Initial assessment;Primapara;Maternal endocrine disorder Age:77 hours  Maternal Data Has patient been taught Hand Expression?: Yes Does the patient have breastfeeding experience prior to this delivery?: No  Feeding    LATCH Score       Type of Nipple: Everted at rest and after stimulation  Comfort (Breast/Nipple): Soft / non-tender         Lactation Tools Discussed/Used    Interventions    Discharge    Consult Status Consult Status: Follow-up Date: 04/12/21 Follow-up type: In-patient    Charyl Dancer 04/11/2021, 10:40 PM

## 2021-04-11 NOTE — Lactation Note (Signed)
This note was copied from a baby's chart. Lactation Consultation Note  Patient Name: Brooke [name] EUMPN'[id number] Date: 04/11/2021 Reason for consult: L&D Initial assessment Age:34 hours LC entered room, mom doing skin to skin and infant cuing to breastfeed. Mom latched infant on her left breast using the football hold position, infant latched with depth and was still breastfeeding after 15 minutes when LC left room. Mom knows to breastfeed infant according to primal cues, 8 to 12 or more times within 24 hours, skin to skin. Mom knows to ask RN/LC for further latch assistance on MBU if needed. LC congratulated mom on the birth of her daughter. Maternal Data    Feeding Mother's Current Feeding Choice: Breast Milk and Formula  LATCH Score Latch: Grasps breast easily, tongue down, lips flanged, rhythmical sucking.  Audible Swallowing: Spontaneous and intermittent  Type of Nipple: Everted at rest and after stimulation  Comfort (Breast/Nipple): Soft / non-tender  Hold (Positioning): Assistance needed to correctly position infant at breast and maintain latch.  LATCH Score: 9   Lactation Tools Discussed/Used    Interventions Interventions: Assisted with latch;Skin to skin;Breast compression;Adjust position;Support pillows;Position options;Education  Discharge    Consult Status Consult Status: Follow-up Date: 04/11/21 Follow-up type: In-patient    Danelle Earthly 04/11/2021, 2:58 AM

## 2021-04-11 NOTE — Discharge Summary (Signed)
Postpartum Discharge Summary     Patient Name: Brooke [name] DOB: 1985-07-02 MRN: [id number]  Date of admission: 04/10/2021 Delivery date:04/11/2021  Delivering provider: Renard Matter  Date of discharge: 04/13/2021  Admitting diagnosis: Gestational diabetes mellitus, class A2 [O24.419] Intrauterine pregnancy: [redacted]w[redacted]d    Secondary diagnosis:  Principal Problem:   Vaginal delivery Active Problems:   Multiple sclerosis ([place]   Supervision of high risk pregnancy, antepartum   Gestational diabetes mellitus, class A2  Additional problems: None    Discharge diagnosis: Term Pregnancy Delivered and GDM A2                                              Post partum procedures: None Augmentation: AROM, Pitocin, Cytotec, and IP Foley Complications: None  Hospital course: Induction of Labor With Vaginal Delivery   36y.o. yo G4P0030 at 336w1das admitted to the hospital 04/10/2021 for induction of labor.  Indication for induction: A2 DM.  Patient had an uncomplicated labor course as follows: Membrane Rupture Time/Date: 6:55 AM ,04/10/2021   Delivery Method:Vaginal, Spontaneous  Episiotomy: None  Lacerations:  2nd degree;Periurethral  Details of delivery can be found in separate delivery note.  Patient had a routine postpartum course.  She is eating, drinking, voiding, and ambulating without issue.  Patient is discharged home 04/13/21.  Newborn Data: Birth date:04/11/2021  Birth time:1:35 AM  Gender:Female  Living status:Living  Apgars:9 ,9  Weight:3317 g   Magnesium Sulfate received: No BMZ received: No Rhophylac: N/A MMR: N/A T-DaP: Given prenatally Flu: Given prenatally Transfusion: No  Physical exam  Vitals:   04/12/21 0508 04/12/21 1551 04/12/21 2126 04/13/21 0537  BP: 117/65 128/72 (!) 124/50 119/71  Pulse: 86 86 74 71  Resp: 20 17 18 18   Temp: 98.4 F (36.9 C) 98.9 F (37.2 C) 99 F (37.2 C) 97.9 F (36.6 C)  TempSrc: Oral Oral Oral Oral  SpO2: 100% 100%     Weight:      Height:       General: alert, cooperative, and no distress Lochia: appropriate Uterine Fundus: firm and below umbilicus  DVT Evaluation: no LE edema or calf tenderness to palpation   Labs: Lab Results  Component Value Date   WBC 6.4 04/10/2021   HGB 11.1 (L) 04/10/2021   HCT 32.0 (L) 04/10/2021   MCV 83.1 04/10/2021   PLT 213 04/10/2021   CMP Latest Ref Rng & Units 07/22/2020  Glucose 65 - 99 mg/dL -  BUN 6 - 20 mg/dL -  Creatinine 0.57 - 1.00 mg/dL -  Sodium 134 - 144 mmol/L -  Potassium 3.5 - 5.2 mmol/L -  Chloride 96 - 106 mmol/L -  CO2 20 - 29 mmol/L -  Calcium 8.7 - 10.2 mg/dL -  Total Protein 6.0 - 8.5 g/dL 7.3  Total Bilirubin 0.0 - 1.2 mg/dL 0.3  Alkaline Phos 44 - 121 IU/L 70  AST 0 - 40 IU/L 15  ALT 0 - 32 IU/L 23   Edinburgh Score: Edinburgh Postnatal Depression Scale Screening Tool 04/11/2021  I have been able to laugh and see the funny side of things. 0  I have looked forward with enjoyment to things. 0  I have blamed myself unnecessarily when things went wrong. 0  I have been anxious or worried for no good reason. 0  I have felt scared or  panicky for no good reason. 0  Things have been getting on top of me. 0  I have been so unhappy that I have had difficulty sleeping. 0  I have felt sad or miserable. 0  I have been so unhappy that I have been crying. 0  The thought of harming myself has occurred to me. 0  Edinburgh Postnatal Depression Scale Total 0     After visit meds:  Allergies as of 04/13/2021       Reactions   Lexapro [escitalopram Oxalate] Other (See Comments)   Suicidal thoughts, lowered libido   Tysabri [natalizumab] Hives   Hives, itching, elevated HR and BP.   Latex Hives        Medication List     STOP taking these medications    Accu-Chek Guide Me w/Device Kit   Accu-Chek Guide test strip Generic drug: glucose blood   Accu-Chek Softclix Lancets lancets   aspirin 81 MG chewable tablet   metFORMIN 500 MG  tablet Commonly known as: GLUCOPHAGE   omeprazole 10 MG capsule Commonly known as: PRILOSEC       TAKE these medications    acetaminophen 500 MG tablet Commonly known as: TYLENOL Take 2 tablets (1,000 mg total) by mouth every 8 (eight) hours as needed (pain). What changed:  medication strength how much to take when to take this reasons to take this additional instructions   Blood Pressure Monitor Misc For regular home bp monitoring during pregnancy   ibuprofen 600 MG tablet Commonly known as: ADVIL Take 1 tablet (600 mg total) by mouth every 6 (six) hours as needed (pain).   PNV Prenatal Plus Multivitamin 27-1 MG Tabs Take 1 daily         Discharge home in stable condition Infant Feeding: Bottle and Breast Infant Disposition: home with mother Discharge instruction: per After Visit Summary and Postpartum booklet. Activity: Advance as tolerated. Pelvic rest for 6 weeks.  Diet: routine diet Future Appointments: Future Appointments  Date Time Provider [place]  04/20/2021  9:30 AM Sater, Nanine Means, MD [facility] None  05/03/2021  9:10 AM [facility]  05/03/2021 10:10 AM Roma Schanz, CNM [facility]   Follow up Visit: Message sent to [facility] by Dr. Cy Blamer on 2/19  Please schedule this patient for a In person postpartum visit in 4 weeks with the following provider: Any provider. Additional Postpartum F/U: 2 hour GTT  High risk pregnancy complicated by: GDM Delivery mode:  Vaginal, Spontaneous  Anticipated Birth Control:   Plans outpatient IUD  04/13/2021 Genia Del, MD

## 2021-04-12 LAB — GLUCOSE, CAPILLARY: Glucose-Capillary: 97 mg/dL (ref 70–99)

## 2021-04-12 NOTE — Progress Notes (Signed)
[place] CPS social worker ([name]) contacted CSW and reported that she was assigned to the case. CPS social worker reported no barriers to discharge.  ° °Jennilee Demarco, LCSW °Clinical Social Worker °[facility] °Cell#: [phone] °

## 2021-04-12 NOTE — Progress Notes (Addendum)
POSTPARTUM PROGRESS NOTE  Post Partum Day 1  Subjective:  Brooke [name] is a 36 y.o. [id number] s/p SVD at [redacted]w[redacted]d.  She reports she is doing well. No acute events overnight. She denies any problems with ambulating, voiding or po intake. Denies nausea or vomiting.  Pain is well controlled with medications, still has some present with movement.  Lochia is present but without clots.  Objective: Blood pressure 117/65, pulse 86, temperature 98.4 F (36.9 C), temperature source Oral, resp. rate 20, height 5\' 4"  (1.626 m), weight (!) 156.2 kg, last menstrual period 07/11/2020, SpO2 100 %, unknown if currently breastfeeding.  Physical Exam:  General: alert, cooperative and no distress Chest: no respiratory distress Heart:regular rate, distal pulses intact Abdomen: soft, nontender,  Uterine Fundus: firm, appropriately tender DVT Evaluation: No calf swelling or tenderness Extremities: 1 pitting edema bilaterally to knees bilaterally Skin: warm, dry  Recent Labs    04/10/21 0107  HGB 11.1*  HCT 32.0*    Assessment/Plan: Brooke [name] is a 36 y.o. 32 s/p SVD at [redacted]w[redacted]d   PPD#1 - Doing well  Routine postpartum care Contraception: outpatient IUD Feeding: Breast and bottle going well. Offering breast first and then bottle Dispo: Plan for discharge likely tomorrow.  #A2GDM-fasting CBG this morning was 97 #Hx of MS-no meds currently-outpatient neuro follow up   LOS: 2 days   [redacted]w[redacted]d, MD  FM PGY-1 04/12/2021, 7:04 AM   FMTS Attending Daily Note: 04/14/2021, MD  Team Pager [phone] Pager [phone] I have seen and examined this patient, reviewed their chart. I have discussed this patient with the resident. I agree with the resident's findings, assessment and care plan.  Message with outpatient neurologist recommending she can restart Tecfidera outpatient, discussed with pharmacy and we do not have on formulary here. Patient also notes she was told she cannot  breastfeed once she restarts this and was planning to restart in outpatient setting. Continue routine postpartum care.

## 2021-04-12 NOTE — Lactation Note (Addendum)
This note was copied from a baby's chart. Lactation Consultation Note  Patient Name: Brooke [name] IRWER'X Date: 04/12/2021 Reason for consult: Follow-up assessment;Mother's request;Breastfeeding assistance;Term;Maternal endocrine disorder (MS ( no meds during preg)) Age:34 hours  Mom offer breasts as opportunity allows until she restarts her MS medication. Mom aware her MS medication is incompatible with breastfeeding.  Mom offering bottles of formula with regular flow nipple as she transitions to full formula feeding.  Formula guideline provided based on hrs of age since delivery. Mom aware to offer more if infant still showing hunger cues.  All questions answered at the end of the visit.   Maternal Data    Feeding Mother's Current Feeding Choice: Formula  LATCH Score                    Lactation Tools Discussed/Used    Interventions Interventions: Breast feeding basics reviewed;Expressed milk;Education;Visual merchandiser education  Discharge    Consult Status Consult Status: Complete (mother declined follow up) Date: 04/12/21    [name]  Nicholson-Springer 04/12/2021, 12:29 PM

## 2021-04-12 NOTE — Clinical Social Work Maternal (Signed)
CLINICAL SOCIAL WORK MATERNAL/CHILD NOTE  Patient Details  Name: MYLASIA VORHEES MRN: [id number] Date of Birth: 05/27/1985  Date:  2021/12/19  Clinical Social Worker Initiating Note:  Abundio Miu, [place] Date/Time: Initiated:  2021/08/12/1149     Child's Name:  Justyce Santellan   Biological Parents:  Mother, Father (Father: Lowella Curb)   Need for Interpreter:  None   Reason for Referral:  Current Substance Use/Substance Use During Pregnancy  , Behavioral Health Concerns   Address:  [place] [address]    Phone number:  [phone] (home)     Additional phone number:   Household Members/Support Persons (HM/SP):   Household Member/Support Person 1, Household Member/Support Person 2, Household Member/Support Person 3   HM/SP Name Relationship DOB or Age  HM/SP -1 Tereall Dickerson FOB    HM/SP -2   sister    HM/SP -3   sister's son    HM/SP -4        HM/SP -5        HM/SP -6        HM/SP -7        HM/SP -8          Natural Supports (not living in the home):  Parent   Professional Supports: None   Employment: Disabled   Type of Work:     Education:  Public librarian arranged:    Museum/gallery curator Resources:  [employer]   Other Resources:  [employer], Physicist, medical     Cultural/Religious Considerations Which May Impact Care:    Strengths:  Ability to meet basic needs  , Engineer, materials, Home prepared for child     Psychotropic Medications:         Pediatrician:    [place]  Pediatrician List:   [place] Other ([facility])  [facility]      Pediatrician Fax Number:    Risk Factors/Current Problems:  Substance Use  , Mental Health Concerns     Cognitive State:  Alert  , Able to Concentrate  , Insightful  , Goal Oriented  , Linear Thinking     Mood/Affect:  Calm  , Happy  , Interested  , Comfortable  , Relaxed     CSW  Assessment: CSW met with MOB at bedside to complete psychosocial assessment. CSW introduced self and explained reason for consult. FOB entered the room. MOB granted CSW verbal permission to speak in front of FOB about anything. MOB was welcoming, open, pleasant, and remained engaged during assessment. MOB reported that she resides with FOB, sister, and sister's son. MOB reported that she receives both [facility] and food stamps. MOB reported that they have all items needed to care for infant including a car seat and crib. CSW inquired about MOB's support system, MOB reported that FOB, her sister, and dad are supports.   CSW inquired about MOB's mental health history. MOB reported that she was diagnosed with anxiety and depression in 2016. MOB reported that she her mental health diagnosis were related to her Multiple Sclerosis diagnosis. MOB reported that she takes Xanax PRN, which is helpful. MOB denied any current symptoms of anxiety/depression. CSW asked if MOB needed any therapy resources, MOB reported no. CSW inquired about how MOB was feeling emotionally since giving birth, MOB reported that she was feeling happy. MOB presented calm and did not demonstrate any acute mental  health signs/symptoms. CSW assessed for safety, MOB denied SI and HI. CSW did not assess for domestic violence as FOB was present.  ° °CSW provided education regarding the baby blues period vs. perinatal mood disorders, discussed treatment and gave resources for mental health follow up if concerns arise.  CSW recommends self-evaluation during the postpartum time period using the New Mom Checklist from Postpartum Progress and encouraged MOB to contact a medical professional if symptoms are noted at any time.   ° °CSW provided review of Sudden Infant Death Syndrome (SIDS) precautions.   ° °CSW informed MOB about the hospital drug screen policy due to documented substance use during pregnancy. MOB confirmed that she used marijuana and reported last  use as 3 weeks ago. MOB reported that she used marijuana due to losing her appetite. MOB denied any additional substance use during pregnancy. CSW informed MOB that infant's UDS was positive for THC and a CPS report would be made. MOB asked appropriate questions, CSW answered questions.  ° °CSW made a [place] [facility] CPS report due to infant's positive UDS for THC. CPS to follow up with family within 72 hours.  ° °CSW identifies no further need for intervention and no barriers to discharge at this time. ° ° °CSW Plan/Description:  No Further Intervention Required/No Barriers to Discharge, Sudden Infant Death Syndrome (SIDS) Education, Perinatal Mood and Anxiety Disorder (PMADs) Education, Hospital Drug Screen Policy Information, Child Protective Service Report    ° ° °Tessie Ordaz L Reine Bristow, LCSW °04/12/2021, 12:12 PM °

## 2021-04-13 ENCOUNTER — Encounter: Payer: Medicaid Other | Admitting: Women's Health

## 2021-04-13 ENCOUNTER — Telehealth: Payer: Self-pay | Admitting: Neurology

## 2021-04-13 MED ORDER — ACETAMINOPHEN 500 MG PO TABS: 1000.0000 mg | ORAL_TABLET | Freq: Three times a day (TID) | ORAL | 0 refills | Status: AC | PRN

## 2021-04-13 MED ORDER — IBUPROFEN 600 MG PO TABS: 600.0000 mg | ORAL_TABLET | Freq: Four times a day (QID) | ORAL | 0 refills | Status: DC | PRN

## 2021-04-13 NOTE — Telephone Encounter (Signed)
Send mychart msg informing pt of 2/28 appt.

## 2021-04-13 NOTE — Progress Notes (Signed)
[place] [facility] CPS social worker ([name]) met with MOB at bedside. CPS social worker reported no barriers to discharge.  ° °Brooke Oelke, LCSW °Clinical Social Worker °[facility] °Cell#: [phone] °

## 2021-04-20 ENCOUNTER — Ambulatory Visit: Payer: Medicaid Other | Admitting: Neurology

## 2021-04-20 ENCOUNTER — Telehealth: Payer: Self-pay

## 2021-04-20 ENCOUNTER — Encounter: Payer: Self-pay | Admitting: Neurology

## 2021-04-20 VITALS — BP 156/82 | HR 85 | Ht 64.0 in | Wt 332.0 lb

## 2021-04-20 DIAGNOSIS — R5383 Other fatigue: Secondary | ICD-10-CM

## 2021-04-20 DIAGNOSIS — G35 Multiple sclerosis: Secondary | ICD-10-CM | POA: Diagnosis not present

## 2021-04-20 DIAGNOSIS — R3915 Urgency of urination: Secondary | ICD-10-CM

## 2021-04-20 DIAGNOSIS — R269 Unspecified abnormalities of gait and mobility: Secondary | ICD-10-CM | POA: Diagnosis not present

## 2021-04-20 DIAGNOSIS — Z79899 Other long term (current) drug therapy: Secondary | ICD-10-CM | POA: Diagnosis not present

## 2021-04-20 MED ORDER — TECFIDERA 240 MG PO CPDR: 1.0000 | DELAYED_RELEASE_CAPSULE | Freq: Two times a day (BID) | ORAL | 11 refills | Status: DC

## 2021-04-20 MED ORDER — TECFIDERA 120 & 240 MG PO MISC: ORAL | 0 refills | Status: DC

## 2021-04-20 MED ORDER — HYDROCHLOROTHIAZIDE 12.5 MG PO CAPS: 12.5000 mg | ORAL_CAPSULE | Freq: Every day | ORAL | 5 refills | Status: DC

## 2021-04-20 MED ORDER — SOLIFENACIN SUCCINATE 5 MG PO TABS: 5.0000 mg | ORAL_TABLET | Freq: Every day | ORAL | 5 refills | Status: DC

## 2021-04-20 NOTE — Telephone Encounter (Signed)
Received Tecfidera Start Form from Dr. Epimenio Foot. Faxed to [employer]. Received a receipt of confirmation.  I called [employer]. Completed PA for Tecfidera 240mg  BID via phone. A PA for Tecfidera 120mg  BID x 7 days is not needed per representative. Reference number: . Should have a determination within 72 hours.

## 2021-04-20 NOTE — Telephone Encounter (Signed)
I spoke with Dr. Epimenio Foot. Dimethyl fumarate is Medicaid's preferred drug and this is ok if the Tecfidera is denied.

## 2021-04-20 NOTE — Progress Notes (Signed)
[facility]  PATIENT: Brooke [name] DOB: July 27, 1985  REFERRING DOCTOR OR PCP:  Dr. Vickey Huger; PCP is Fleet Contras SOURCE: Patient, notes from Dr. Vickey Huger, imaging and laboratory reports, MRI images on PACS.  _________________________________   HISTORICAL  CHIEF COMPLAINT:  Chief Complaint  Patient presents with   Follow-up    Rm 1, w partner. Hospital f/u after daughters delivery on 2/19. Pt reports doing better. Legs are swollen making it hard for her to walk.     HISTORY OF PRESENT ILLNESS:  Brooke [name] is a 36 y.o.  woman with multiple sclerosis.      Update 04/20/2021 She restarted Tecfidera January 2021 after a miscarriage December 2020.   She was tolerating it well.   She became pregnant again mid 2022 and delivered at 39 weeks 04/10/2021 (uncomplicated induction, induced or gestational diabetes).   She is breastfeeding some but mostly baby girl (Justyce) is using a bottle.     She will completely use a bottle by time of Tecfidera restart  She is experiencing frontal headaches, also left occipital pain.   NSAIDs helps a  little bit.     Gait is off due to reduced balance   She stumbled more towards the end of pregnancy but also noted a lot of edema.     The  left leg was weaker than right - but she feels about the same now.  Ampyra had no benefit.   She has some tingling in her fingertips. No major numbness or dysesthesias.   Gabapentin is doing well.     She has urinary urge incontinence that did better with solifenacin - works better than oxybutynin.   It was stopped during pregnancy.       She has some fatigue.  She is sleeping poorly waking up for the bay every few hours.  She falls back asleep ok as very tired.  She denies depression but has some anxiety.  She takes xanax prn.    Cognition is doing ok.     We discussed taking vit D supplements    MS History She was diagnosed with MS in 2016 after presenting with visual disturbance,  diplopia and poor gait.    She was initially placed on Tecfidera.   She did not note any benefit desired to switch medications and started Ocrevus.  She did not feel good on that medication.  She switched to see me in 2019.  We switched to Tysabri.  Unfortunately, she had allergic reaction after her second infusion and went back on Tecfidera.  She was off for several months while pregnant but went back on after miscarriage and December 2020  Imaging MRI of the brain 05/13/2016 showed multiple T2/FLAIR hyperintense foci in the pons and hemispheres in a pattern and configuration consistent with chronic demyelinating plaque associated with multiple sclerosis. None of the foci appears to be acute. When compared to the MRI dated 01/11/2015, there is no interval change.    There is a normal enhancement pattern and there are no acute findings.  MRI of the cervical spine 05/13/2016 Foci within the posterior spinal cord adjacent to C3 and the left spinal cord adjacent to C5.   Neither of these appear to be acute.      Minimal disc degenerative changes at C5-C6 and C6-C7 that did not lead to any nerve root impingement.    There is a normal enhancement pattern and there are no acute findings.  MRI of the thoracic spine 05/16/2016  showed a focus within the left anterior spinal cord adjacent to T8-T9 consistent with a demyelinating plaque associated with multiple sclerosis. It appears to be chronic and does not enhance. There is a small focus to the right adjacent to T3-T4.   It is noted on the axial images but not the sagittal images and could represent a small chronic demyelinating plaque or be artifactual. There is small right lateral disc protrusion at T11-T12. There is no nerve root compression..   There is a normal enhancement pattern.  MRI of the cervical spine 08/07/2019 showed a T2 hyperintense foci within the spinal cord adjacent to C2, C3 and C5.  None of the foci appear to be acute.  They do not enhance.  They  were present on the MRI from 05/12/2016.   Mild disc degenerative changes at C5-C6 and C6-C7 that do not lead to spinal stenosis or nerve root compression.   MRI of the brain 08/09/2019 showed multiple T2/FLAIR hyperintense foci in the pons and hemispheres in a pattern configuration consistent with chronic demyelinating plaque associated with multiple sclerosis.  None of the foci appear to be acute and they do not enhance.  Compared to the MRI dated 05/12/2016, there are no definite new lesions.  No acute findings and a normal enhancement pattern.  MRI brain 08/07/2019 shows multiple T2/FLAIR hyperintense foci in the pons and hemispheres in a pattern configuration consistent with chronic demyelinating plaque associated with multiple sclerosis.  None of the foci appear to be acute and they do not enhance.  Compared to the MRI dated 05/12/2016, there are no definite new lesions  MRI cervical spine 08/09/2019  T2 hyperintense foci within the spinal cord adjacent to C2, C3 and C5.  None of the foci appear to be acute.  They do not enhance.  They were present on the MRI from 05/12/2016. 2.   Mild disc degenerative changes at C5-C6 and C6-C7 that do not lead to spinal stenosis or nerve root compression.  REVIEW OF SYSTEMS: Constitutional: No fevers, chills, sweats, or change in appetite.  She notes fatigue. Eyes: No visual changes, double vision, eye pain Ear, nose and throat: No hearing loss, ear pain, nasal congestion, sore throat Cardiovascular: No chest pain, palpitations Respiratory:  No shortness of breath at rest or with exertion.   No wheezes GastrointestinaI: No nausea, vomiting, diarrhea, abdominal pain, fecal incontinence Genitourinary:  No dysuria, urinary retention or frequency.  No nocturia. Musculoskeletal:  No neck pain, back pain Integumentary: No rash, pruritus, skin lesions Neurological: as above Psychiatric: She denies depression or anxiety.   Endocrine: No palpitations, diaphoresis,  change in appetite, change in weigh or increased thirst Hematologic/Lymphatic:  No anemia, purpura, petechiae. Allergic/Immunologic: No itchy/runny eyes, nasal congestion, recent allergic reactions, rashes  ALLERGIES: Allergies  Allergen Reactions   Lexapro [Escitalopram Oxalate] Other (See Comments)    Suicidal thoughts, lowered libido   Tysabri [Natalizumab] Hives    Hives, itching, elevated HR and BP.   Latex Hives    HOME MEDICATIONS:  Current Outpatient Medications:    acetaminophen (TYLENOL) 500 MG tablet, Take 2 tablets (1,000 mg total) by mouth every 8 (eight) hours as needed (pain)., Disp: 60 tablet, Rfl: 0   Blood Pressure Monitor MISC, For regular home bp monitoring during pregnancy, Disp: 1 each, Rfl: 0   hydrochlorothiazide (MICROZIDE) 12.5 MG capsule, Take 1 capsule (12.5 mg total) by mouth daily., Disp: 30 capsule, Rfl: 5   ibuprofen (ADVIL) 600 MG tablet, Take 1 tablet (600 mg total)  by mouth every 6 (six) hours as needed (pain)., Disp: 40 tablet, Rfl: 0   Prenatal Vit-Fe Fumarate-FA (PNV PRENATAL PLUS MULTIVITAMIN) 27-1 MG TABS, Take 1 daily, Disp: 30 tablet, Rfl: 12   solifenacin (VESICARE) 5 MG tablet, Take 1 tablet (5 mg total) by mouth daily., Disp: 30 tablet, Rfl: 5   TECFIDERA 120 & 240 MG MISC, Take 120mg  by mouth twice daily for 7 days and then 240mg  by mouth twice daily for 23 days, Disp: 60 each, Rfl: 0   TECFIDERA 240 MG CPDR, Take 1 capsule (240 mg total) by mouth 2 (two) times daily., Disp: 60 capsule, Rfl: 11  PAST MEDICAL HISTORY: Past Medical History:  Diagnosis Date   Anxiety    Chlamydia 02/28/2020   Treated 02/28/20 POC________   Chronic back pain    Depression    Diabetes mellitus without complication (HCC)    Edema    Gestational diabetes    MS (multiple sclerosis) (HCC) 09/29/2014    PAST SURGICAL HISTORY: Past Surgical History:  Procedure Laterality Date   APPENDECTOMY     CHOLECYSTECTOMY N/A 10/07/2013   Procedure: LAPAROSCOPIC  CHOLECYSTECTOMY;  Surgeon: 11/29/2014, MD;  Location: [facility];  Service: General;  Laterality: N/A;   LAPAROSCOPIC APPENDECTOMY N/A 05/12/2015   Procedure: APPENDECTOMY LAPAROSCOPIC;  Surgeon: Dalia Heading, MD;  Location: [facility];  Service: General;  Laterality: N/A;   WISDOM TOOTH EXTRACTION      FAMILY HISTORY: Family History  Problem Relation Age of Onset   Diabetes Mother    Colon polyps Mother        hx of cancer   Other Mother        DDD [place], cervical   Sleep apnea Father     SOCIAL HISTORY:  Social History   Socioeconomic History   Marital status: Single    Spouse name: Not on file   Number of children: Not on file   Years of education: 2 y colleg   Highest education level: Not on file  Occupational History   Occupation: work  Tobacco Use   Smoking status: Former    Types: Cigarettes, E-cigarettes    Quit date: 09/16/2007    Years since quitting: 13.6   Smokeless tobacco: Never  Vaping Use   Vaping Use: Former  Substance and Sexual Activity   Alcohol use: Not Currently   Drug use: Not Currently    Types: Marijuana    Comment: occ   Sexual activity: Yes    Birth control/protection: None  Other Topics Concern   Not on file  Social History Narrative   Drinks 1-2 large glasses of caffeine daily.      Social Determinants of Health   Financial Resource Strain: Low Risk    Difficulty of Paying Living Expenses: Not hard at all  Food Insecurity: No Food Insecurity   Worried About Franky Macho in the Last Year: Never true   Ran Out of Food in the Last Year: Never true  Transportation Needs: No Transportation Needs   Lack of Transportation (Medical): No   Lack of Transportation (Non-Medical): No  Physical Activity: Insufficiently Active   Days of Exercise per Week: 2 days   Minutes of Exercise per Session: 20 min  Stress: No Stress Concern Present   Feeling of Stress : Not at all  Social Connections: Moderately Integrated   Frequency of  Communication with Friends and Family: More than three times a week   Frequency of Social Gatherings with  Friends and Family: Three times a week   Attends Religious Services: More than 4 times per year   Active Member of Clubs or Organizations: Yes   Attends Banker Meetings: 1 to 4 times per year   Marital Status: Widowed  Intimate Partner Violence: Not At Risk   Fear of Current or Ex-Partner: No   Emotionally Abused: No   Physically Abused: No   Sexually Abused: No     PHYSICAL EXAM  Vitals:   04/20/21 0936  BP: (!) 156/82  Pulse: 85  Weight: (!) 332 lb (150.6 kg)  Height: 5\' 4"  (1.626 m)     Body mass index is 56.99 kg/m.   General: The patient is well-developed and well-nourished and in no acute distress   Skin: Extremities are without rash .   Mild edema   Neurologic Exam  Mental status: The patient is alert and oriented x 3 at the time of the examination. The patient has apparent normal recent and remote memory, with an apparently normal attention span and concentration ability.   Speech is normal.  Cranial nerves: Extraocular movements are full.  Facial strength and sensation is normal.  Trapezius strength is normal.. No obvious hearing deficits are noted.  Motor:  Muscle bulk is normal.   Tone is mildly increased in the left leg.  Strength was 4+/5 in the left leg and 5/5 on the right.  Sensory: She has normal and symmetric sensation to touch in arms/legs.  Coordination: Cerebellar testing reveals good finger-nose-finger and heel-to-shin bilaterally.  Gait and station: Station is normal.  Mild left foot drop.  Her tandem gait is poor..    Romberg is negative..   Reflexes: Deep tendon reflexes are symmetric and normal in the arms and increased at the knees.     DIAGNOSTIC DATA (LABS, IMAGING, TESTING) - I reviewed patient records, labs, notes, testing and imaging myself where available.  Lab Results  Component Value Date   WBC 6.4  04/10/2021   HGB 11.1 (L) 04/10/2021   HCT 32.0 (L) 04/10/2021   MCV 83.1 04/10/2021   PLT 213 04/10/2021      Component Value Date/Time   NA 140 07/03/2019 1624   K 4.0 07/03/2019 1624   CL 100 07/03/2019 1624   CO2 28 07/03/2019 1624   GLUCOSE 168 (H) 07/03/2019 1624   GLUCOSE 98 02/25/2018 1650   BUN 11 07/03/2019 1624   CREATININE 0.68 07/03/2019 1624   CALCIUM 9.3 07/03/2019 1624   PROT 7.3 07/22/2020 1455   ALBUMIN 4.2 07/22/2020 1455   AST 15 07/22/2020 1455   ALT 23 07/22/2020 1455   ALKPHOS 70 07/22/2020 1455   BILITOT 0.3 07/22/2020 1455   GFRNONAA 117 07/03/2019 1624   GFRAA 135 07/03/2019 1624   ____________________________________________  Assessment and plan:  Multiple sclerosis (HCC) - Plan: TECFIDERA 120 & 240 MG MISC, CBC with Differential/Platelet, Comprehensive metabolic panel, CANCELED: Hepatic function panel  High risk medication use - Plan: CBC with Differential/Platelet, Comprehensive metabolic panel, CANCELED: Hepatic function panel  Urinary urgency  Gait disturbance  Other fatigue   1.    Resume Tecfidera. Check Hep function panel and CBC with differential today   She is doing both breastfeeding and bottle but will be exclusive bottle/formula before restarting Tecfidera 2.    Continue Vesicare for urge incontinence    HCTZ for edema 3.    Stay active and exercise as tolerated.   4.    Return in 6 months she should  call sooner if there are new or worsening neurologic symptoms.   Alexei Doswell A. Epimenio Foot, MD, PhD, FAAN Certified in Neurology, Clinical Neurophysiology, Sleep Medicine, Pain Medicine and Neuroimaging Director, [facility] at [facility]  [facility] [address] [place], [place] [address] [phone]

## 2021-04-21 ENCOUNTER — Telehealth (HOSPITAL_COMMUNITY): Payer: Self-pay | Admitting: *Deleted

## 2021-04-21 LAB — CBC WITH DIFFERENTIAL/PLATELET
Basophils Absolute: 0 10*3/uL (ref 0.0–0.2)
Basos: 0 %
EOS (ABSOLUTE): 0.1 10*3/uL (ref 0.0–0.4)
Eos: 1 %
Hematocrit: 29 % — ABNORMAL LOW (ref 34.0–46.6)
Hemoglobin: 10.4 g/dL — ABNORMAL LOW (ref 11.1–15.9)
Immature Grans (Abs): 0 10*3/uL (ref 0.0–0.1)
Immature Granulocytes: 0 %
Lymphocytes Absolute: 1.5 10*3/uL (ref 0.7–3.1)
Lymphs: 20 %
MCH: 29.5 pg (ref 26.6–33.0)
MCHC: 35.9 g/dL — ABNORMAL HIGH (ref 31.5–35.7)
MCV: 82 fL (ref 79–97)
Monocytes Absolute: 0.3 10*3/uL (ref 0.1–0.9)
Monocytes: 4 %
Neutrophils Absolute: 5.6 10*3/uL (ref 1.4–7.0)
Neutrophils: 75 %
Platelets: 303 10*3/uL (ref 150–450)
RBC: 3.53 x10E6/uL — ABNORMAL LOW (ref 3.77–5.28)
RDW: 13.6 % (ref 11.7–15.4)
WBC: 7.5 10*3/uL (ref 3.4–10.8)

## 2021-04-21 LAB — COMPREHENSIVE METABOLIC PANEL
ALT: 11 IU/L (ref 0–32)
AST: 13 IU/L (ref 0–40)
Albumin/Globulin Ratio: 1.1 — ABNORMAL LOW (ref 1.2–2.2)
Albumin: 3.5 g/dL — ABNORMAL LOW (ref 3.8–4.8)
Alkaline Phosphatase: 139 IU/L — ABNORMAL HIGH (ref 44–121)
BUN/Creatinine Ratio: 11 (ref 9–23)
BUN: 8 mg/dL (ref 6–20)
Bilirubin Total: 0.3 mg/dL (ref 0.0–1.2)
CO2: 25 mmol/L (ref 20–29)
Calcium: 8.5 mg/dL — ABNORMAL LOW (ref 8.7–10.2)
Chloride: 106 mmol/L (ref 96–106)
Creatinine, Ser: 0.76 mg/dL (ref 0.57–1.00)
Globulin, Total: 3.1 g/dL (ref 1.5–4.5)
Glucose: 97 mg/dL (ref 70–99)
Potassium: 3.8 mmol/L (ref 3.5–5.2)
Sodium: 144 mmol/L (ref 134–144)
Total Protein: 6.6 g/dL (ref 6.0–8.5)
eGFR: 106 mL/min/{1.73_m2} (ref >59)

## 2021-04-21 NOTE — Telephone Encounter (Signed)
Mom reports feeling good. No concerns about herself at this time. EPDS=0(Hospital score=0) ?Mom reports baby is doing well. Feeding, peeing, and pooping without difficulty. Safe sleep reviewed. Mom reports no concerns about baby at present. ? ?Duffy Rhody, RN 04-21-2021 at 1:38pm ?

## 2021-05-03 ENCOUNTER — Other Ambulatory Visit: Payer: Medicaid Other

## 2021-05-03 ENCOUNTER — Ambulatory Visit: Payer: Medicaid Other | Admitting: Women's Health

## 2021-05-03 DIAGNOSIS — Z8632 Personal history of gestational diabetes: Secondary | ICD-10-CM

## 2021-05-03 NOTE — Telephone Encounter (Signed)
I called [employer].The PA for tecfidera was not submitted to the authorization department and they cannot access the answers I provided to the agent on 04/20/21. They will fax me a form to complete the Tecfidera PA. The turn around time is 24 hours when they receive the completed faxed PA. ?

## 2021-05-03 NOTE — Telephone Encounter (Signed)
Completed PA request for Tecfidera via fax. Faxed to [facility]. Received a receipt of confirmation. ?

## 2021-05-04 ENCOUNTER — Other Ambulatory Visit: Payer: Self-pay | Admitting: Women's Health

## 2021-05-04 ENCOUNTER — Encounter: Payer: Self-pay | Admitting: Women's Health

## 2021-05-04 DIAGNOSIS — Z8632 Personal history of gestational diabetes: Secondary | ICD-10-CM

## 2021-05-04 DIAGNOSIS — E7439 Other disorders of intestinal carbohydrate absorption: Secondary | ICD-10-CM

## 2021-05-04 DIAGNOSIS — Z131 Encounter for screening for diabetes mellitus: Secondary | ICD-10-CM

## 2021-05-04 LAB — GLUCOSE TOLERANCE, 2 HOURS W/ 1HR
Glucose, 1 hour: 150 mg/dL (ref 70–179)
Glucose, 2 hour: 153 mg/dL — ABNORMAL HIGH (ref 70–152)
Glucose, Fasting: 86 mg/dL (ref 70–91)

## 2021-05-05 ENCOUNTER — Other Ambulatory Visit: Payer: Self-pay

## 2021-05-05 ENCOUNTER — Encounter: Payer: Medicaid Other | Admitting: Women's Health

## 2021-05-05 MED ORDER — TECFIDERA 120 & 240 MG PO MISC: ORAL | 0 refills | Status: DC

## 2021-05-05 MED ORDER — DIMETHYL FUMARATE 240 MG PO CPDR: 1.0000 | DELAYED_RELEASE_CAPSULE | Freq: Two times a day (BID) | ORAL | 11 refills | Status: DC

## 2021-05-05 NOTE — Progress Notes (Signed)
Pt scheduled too early for pp visit, reschedule. Wants to reschedule for 3wks for pp visit, IUD, pap. This encounter was created in error - please disregard. ?

## 2021-05-05 NOTE — Patient Instructions (Signed)
Nothing in vagina for 3 days (no sex, douching, tampons, etc...) Check your strings once a month to make sure you can feel them, if you are not able to please let us know If you develop a fever of 100.4 or more in the next few weeks, or if you develop severe abdominal pain, please let us know Use a backup method of birth control, such as condoms, for 2 weeks  Intrauterine Device Insertion, Care After This sheet gives you information about how to care for yourself after your procedure. Your health care provider may also give you more specific instructions. If you have problems or questions, contact your health care provider. What can I expect after the procedure? After the procedure, it is common to have: Cramps and pain in the abdomen. Bleeding. It may be light or heavy. This may last for a few days. Lower back pain. Dizziness. Headaches. Nausea. Follow these instructions at home:  Before resuming sexual activity, check to make sure that you can feel the IUD string or strings. You should be able to feel the end of the string below the opening of your cervix. If your IUD string is in place, you may resume sexual activity. If you had a hormonal IUD inserted more than 7 days after your most recent period started, you will need to use a backup method of birth control for 7 days after IUD insertion. Ask your health care provider whether this applies to you. Continue to check that the IUD is still in place by feeling for the strings after every menstrual period, or once a month. An IUD will not protect you from sexually transmitted infections (STIs). Use methods to prevent the exchange of body fluids between partners (barrier protection) every time you have sex. Barrier protection can be used during oral, vaginal, or anal sex. Commonly used barrier methods include: Female condom. Female condom. Dental dam. Take over-the-counter and prescription medicines only as told by your health care  provider. Keep all follow-up visits as told by your health care provider. This is important. Contact a health care provider if: You feel light-headed or weak. You have any of the following problems with your IUD string or strings: The string bothers or hurts you or your sexual partner. You cannot feel the string. The string has gotten longer. You can feel the IUD in your vagina. You think you may be pregnant, or you miss your menstrual period. You think you may have a sexually transmitted infection (STI). Get help right away if: You have flu-like symptoms, such as tiredness (fatigue) and muscle aches. You have a fever and chills. You have bleeding that is heavier or lasts longer than a normal menstrual cycle. You have abnormal or bad-smelling discharge from your vagina. You develop abdominal pain that is new, is getting worse, or is not in the same area of earlier cramping and pain. You have pain during sexual activity. Summary After the procedure, it is common to have cramps and pain in the abdomen. It is also common to have light bleeding or heavier bleeding that is like your menstrual period. Continue to check that the IUD is still in place by feeling for the strings after every menstrual period, or once a month. Keep all follow-up visits as told by your health care provider. This is important. Contact your health care provider if you have problems with your IUD strings, such as the string getting longer or bothering you or your sexual partner. This information is not intended   to replace advice given to you by your health care provider. Make sure you discuss any questions you have with your health care provider. Document Revised: 01/29/2019 Document Reviewed: 01/29/2019 Elsevier Patient Education  2022 [employer] Inc[employer]  

## 2021-05-05 NOTE — Progress Notes (Deleted)
POSTPARTUM VISIT Patient name: Brooke [name] MRN [id number]  Date of birth: 06-12-1985 Chief Complaint:   No chief complaint on file.  History of Present Illness:   Brooke [name] is a 36 y.o. G53P1031 African American female being seen today for a postpartum visit. She is 3 weeks postpartum following a spontaneous vaginal delivery at 39.1 gestational weeks. IOL: yes, for diabetes mellitus A2DM . Anesthesia: epidural.  Laceration: 2nd degree and periurethral.  Complications: none. Inpatient contraception: no.   Pregnancy complicated by A2/BDM . Tobacco use: former . Substance use disorder: no. Last pap smear: 05/10/18 and results were NILM w/ HRHPV negative. Next pap smear due: now No LMP recorded.  Postpartum course has been uncomplicated. Bleeding {desc; vaginal bleeding:14141::"none"}. Bowel function is {desc; normal/abnormal:32111::"normal"}. Bladder function is {desc; normal/abnormal:32111::"normal"}. Urinary incontinence? {yes***/no:23838::"no"}, fecal incontinence? {yes***/no:23838::"no"} Patient {is/is not:9024} sexually active. Last sexual activity: {pp sex:25409::"prior to birth of baby"}. Desired contraception: {Birth control type:23956}. Patient {DOES_DOES IRJ:18841} want a pregnancy in the future.  Desired family size is {NUMBER 1-10:22536} children.   The pregnancy intention screening data noted above was reviewed. Potential methods of contraception were discussed. The patient elected to proceed with No data recorded.  Edinburgh Postpartum Depression Screening: {EPDS neg/pos:24290::"negative"}   GAD 7 : Generalized Anxiety Score 01/12/2021 10/14/2020 10/01/2019  Nervous, Anxious, on Edge 0 0 0  Control/stop worrying 0 0 1  Worry too much - different things 0 1 1  Trouble relaxing 1 0 0  Restless 0 0 0  Easily annoyed or irritable 1 1 0  Afraid - awful might happen 0 0 0  Total GAD 7 Score 2 2 2   Anxiety Difficulty - - Not difficult at all     Baby's course has been  {Complicated/Uncomplicated:20316::"uncomplicated"}. Baby is feeding by {baby feeding:24291}. Infant has a pediatrician/family doctor? {Yes/No:304960898::"Yes"}.  Childcare strategy if returning to work/school: {childcare:25407}.  Pt has material needs met for her and baby: {Yes/No:304960898::"Yes"}.   Review of Systems:   Pertinent items are noted in HPI Denies Abnormal vaginal discharge w/ itching/odor/irritation, headaches, visual changes, shortness of breath, chest pain, abdominal pain, severe nausea/vomiting, or problems with urination or bowel movements. Pertinent History Reviewed:  Reviewed past medical,surgical, obstetrical and family history.  Reviewed problem list, medications and allergies. OB History  Gravida Para Term Preterm AB Living  4 1 1   3 1   SAB IAB Ectopic Multiple Live Births  1   2 0 1    # Outcome Date GA Lbr Len/2nd Weight Sex Delivery Anes PTL Lv  4 Term 04/11/21 [redacted]w[redacted]d 06:24 / 00:20 7 lb 5 oz (3.317 kg) F Vag-Spont EPI  LIV  3 Ectopic 01/2019          2 SAB 2013          1 Ectopic 2009           Physical Assessment:  There were no vitals filed for this visit.There is no height or weight on file to calculate BMI.       Physical Examination:   General appearance: {:315021}  Mental status: {:313008}  Skin: warm & dry   Cardiovascular: normal heart rate noted   Respiratory: normal respiratory effort, no distress   Breasts: deferred, no complaints   Abdomen: soft, non-tender   Pelvic: {:315900}. Thin prep pap obtained: {yes/no:20286::"No"}  Rectal: {hemorrhoid:25408::"not examined"}  Extremities: Edema: {Numbers; edema:17696::"none"}        No results found for this or any previous visit (from the past 24  hour(s)).   IUD INSERTION The risks and benefits of the method and placement have been thouroughly reviewed with the patient and all questions were answered.  Specifically the patient is aware of failure rate of 02/998, expulsion of the IUD and of possible  perforation.  The patient is aware of irregular bleeding due to the method and understands the incidence of irregular bleeding diminishes with time.  Signed copy of informed consent in chart.   Time out was performed.  A *** speculum was placed in the vagina.  The cervix was visualized, prepped using Betadine, and grasped with a single tooth tenaculum. The uterus was found to be {Uterine position:16454} and it sounded to {NUMBERS 0-12:18577} cm.  {IUD:19197::"Liletta","Mirena","Paragard","Skyla","Kyleena"}  IUD placed per manufacturer's recommendations. The strings were trimmed to approximately 3 cm. The patient tolerated the procedure well.   Informal transvaginal sonogram was performed and the proper placement of the IUD was verified.  Chaperone: {Chaperone:19197::"N/A","pt declined","Latisha Cresenzo","Janet Young","Amanda Andrews","Peggy Dones","Angel Neas"}    Assessment & Plan:  1) Postpartum exam 2) *** wks s/p {delivery outcome:32078} 3) {Blank single:19197::"breast","bottle","breast & bottle"} feeding 4) Depression screening 5) {IUD:19197::"Liletta","Mirena","Paragard","Skyla","Kyleena"} IUD insertion The patient was given post procedure instructions, including signs and symptoms of infection and to check for the strings after each menses or each month, and refraining from intercourse or anything in the vagina for 3 days.  Condoms for 2 weeks. She was given a care card with date IUD placed, and date IUD to be removed.  She is scheduled for a f/u appointment in 4 weeks.  Essential components of care per ACOG recommendations:  1.  Mood and well being:  If positive depression screen, discussed and plan developed.  If using tobacco we discussed reduction/cessation and risk of relapse If current substance abuse, we discussed and referral to local resources was offered.   2. Infant care and feeding:  If breastfeeding, discussed returning to work, pumping, breastfeeding-associated pain,  guidance regarding return to fertility while lactating if not using another method. If needed, patient was provided with a letter to be allowed to pump q 2-3hrs to support lactation in a private location with access to a refrigerator to store breastmilk.   Recommended that all caregivers be immunized for flu, pertussis and other preventable communicable diseases If pt does not have material needs met for her/baby, referred to local resources for help obtaining these.  3. Sexuality, contraception and birth spacing Provided guidance regarding sexuality, management of dyspareunia, and resumption of intercourse Discussed avoiding interpregnancy interval <63mths and recommended birth spacing of 18 months  4. Sleep and fatigue Discussed coping options for fatigue and sleep disruption Encouraged family/partner/community support of 4 hrs of uninterrupted sleep to help with mood and fatigue  5. Physical recovery  If pt had a C/S, assessed incisional pain and providing guidance on normal vs prolonged recovery If pt had a laceration, perineal healing and pain reviewed.  If urinary or fecal incontinence, discussed management and referred to PT or uro/gyn if indicated  Patient {ACTION; IS/IS WUJ:81191478} safe to resume physical activity. Discussed attainment of healthy weight.  6.  Chronic disease management Discussed pregnancy complications if any, and their implications for future childbearing and long-term maternal health. Review recommendations for prevention of recurrent pregnancy complications, such as 17 hydroxyprogesterone caproate to reduce risk for recurrent PTB {Response; yes/no/na:63}, or aspirin to reduce risk of preeclampsia {Response; yes/no/na:63}. Pt had GDM: {yes/no:20286}. If yes, 2hr GTT scheduled: {Response; yes/no/na:63}. Reviewed medications and non-pregnant dosing including consideration of whether pt  is breastfeeding using a reliable resource such as LactMed: {Response;  yes/no/na:63} Referred for f/u w/ PCP or subspecialist providers as indicated: {Response; yes/no/na:63}  7. Health maintenance Mammogram at 36yo or earlier if indicated Pap smears as indicated  Meds: No orders of the defined types were placed in this encounter.   Follow-up: No follow-ups on file.   No orders of the defined types were placed in this encounter.   Cheral Marker CNM, [facility] 05/05/2021 2:58 PM

## 2021-05-05 NOTE — Progress Notes (Signed)
Notified pt in person at her appt. ?

## 2021-05-05 NOTE — Telephone Encounter (Signed)
LVM letting pt know her insurance denied coverage for brand name Tecfidera. We will be sending in dimethyl fumarate to [facility] instead. Phone: [phone]. Asked her to call our office back if she has any more questions/concerns.  ? ?E-scribed rx's to [facility].  ?

## 2021-05-11 NOTE — Telephone Encounter (Signed)
I called patient to find out if she received her dimethyl fumarate RXs. No answer, left a message asking her to call me back. ?

## 2021-05-17 ENCOUNTER — Encounter: Payer: Self-pay | Admitting: Adult Health

## 2021-05-17 ENCOUNTER — Ambulatory Visit (INDEPENDENT_AMBULATORY_CARE_PROVIDER_SITE_OTHER): Payer: Medicaid Other | Admitting: Adult Health

## 2021-05-17 ENCOUNTER — Other Ambulatory Visit: Payer: Self-pay

## 2021-05-17 ENCOUNTER — Other Ambulatory Visit (HOSPITAL_COMMUNITY)
Admission: RE | Admit: 2021-05-17 | Discharge: 2021-05-17 | Disposition: A | Discharge status: Home / Self Care | Payer: Medicaid Other | Source: Ambulatory Visit | Attending: Adult Health | Admitting: Adult Health

## 2021-05-17 VITALS — BP 141/81 | HR 64 | Ht 64.0 in | Wt 326.2 lb

## 2021-05-17 DIAGNOSIS — N898 Other specified noninflammatory disorders of vagina: Secondary | ICD-10-CM | POA: Insufficient documentation

## 2021-05-17 DIAGNOSIS — Z01419 Encounter for gynecological examination (general) (routine) without abnormal findings: Secondary | ICD-10-CM | POA: Diagnosis not present

## 2021-05-17 DIAGNOSIS — Z124 Encounter for screening for malignant neoplasm of cervix: Secondary | ICD-10-CM | POA: Diagnosis not present

## 2021-05-17 NOTE — Progress Notes (Signed)
?  Subjective:  ?  ? Patient ID: Brooke [name], female   DOB: 10/12/1985, 36 y.o.   MRN: [id number] ? ?HPI ?Brooke [name] is a 36 year old black female,single, [id number] in complaining of vaginal odor and needs a pap. She had vaginal delivery in February, baby girl. ?PCP is Dr Concepcion Elk. ? ?Lab Results  ?Component Value Date  ? DIAGPAP  05/10/2018  ?  NEGATIVE FOR INTRAEPITHELIAL LESIONS OR MALIGNANCY.  ? HPV NOT DETECTED 05/10/2018  ?  ?Review of Systems ?+vaginal odor ?Denies any itching or burning ?No sex since delivery  ?Reviewed past medical,surgical, social and family history. Reviewed medications and allergies.  ?   ?Objective:  ? Physical Exam ?BP (!) 141/81 (BP Location: Right Arm, Patient Position: Sitting, Cuff Size: Normal)   Pulse 64   Ht 5\' 4"  (1.626 m)   Wt (!) 326 lb 3.2 oz (148 kg)   Breastfeeding No   BMI 55.99 kg/m?   ?  Skin warm and dry.Pelvic: external genitalia is normal in appearance no lesions, vagina: scant discharge with ammonia like odor,urethra has no lesions or masses noted, cervix:smooth and bulbous, uterus: normal size, shape and contour, non tender, no masses felt, adnexa: no masses or tenderness noted. Bladder is non tender and no masses felt.  ? Upstream - 05/17/21 1021   ? ?  ? Pregnancy Intention Screening  ? Does the patient want to become pregnant in the next year? No   ? Does the patient's partner want to become pregnant in the next year? No   ? Would the patient like to discuss contraceptive options today? No   ?  ? Contraception Wrap Up  ? Current Method Abstinence   ? End Method Abstinence   ? Contraception Counseling Provided No   ? ?  ?  ? ?  ? Examination chaperoned by 05/19/21 RN ? ?Assessment:  ?   ?1. Vaginal odor ?Smells like ammonia, will wait on pap results  ?Just bath with mild soap an water  ? ?2. Encounter for gynecological examination with Papanicolaou smear of cervix ?Pap sent with GC/CHL and HR HPV genotyping  ?   ?Plan:  ?  No sex. Since wants IUD at PPV ?Has  postpartum appt 05/26/21 with 07/26/21 CNM  ?   ?

## 2021-05-18 ENCOUNTER — Encounter: Payer: Self-pay | Admitting: Neurology

## 2021-05-18 NOTE — Telephone Encounter (Signed)
[facility]) Only able to fill the starter for Tecfidera. Pt is no longer in network with [place]; Insurance with [place] would only allow generic brand. Would need send prescription to [facility]. Would like a call back to verify starter pack was fill with [facility]. ? ?Contact info: [phone] ?

## 2021-05-18 NOTE — Telephone Encounter (Signed)
A generic script has already been sent to [facility] for the maintenance dose for the pt.  ?They are wanting to make sure that the dispensed started pack that went out to the patient in generic was ok. Advised that was ok.  ?They have sent the pt the started pack.  ?

## 2021-05-19 LAB — CYTOLOGY - PAP
Chlamydia: NEGATIVE
Comment: NEGATIVE
Comment: NEGATIVE
Comment: NORMAL
Diagnosis: NEGATIVE
High risk HPV: NEGATIVE
Neisseria Gonorrhea: NEGATIVE

## 2021-05-20 ENCOUNTER — Other Ambulatory Visit: Payer: Self-pay | Admitting: Neurology

## 2021-05-20 DIAGNOSIS — R296 Repeated falls: Secondary | ICD-10-CM

## 2021-05-20 DIAGNOSIS — G35D Multiple sclerosis, unspecified: Secondary | ICD-10-CM

## 2021-05-20 DIAGNOSIS — G35 Multiple sclerosis: Secondary | ICD-10-CM

## 2021-05-20 MED ORDER — AMPHETAMINE-DEXTROAMPHET ER 30 MG PO CP24: 30.0000 mg | ORAL_CAPSULE | Freq: Every day | ORAL | 0 refills | Status: DC

## 2021-05-20 NOTE — Telephone Encounter (Signed)
Pt said, pharmacy could not send medication request because pt needed to call her neurologist. Need neurologist to send prescription. Would like a call from the nurse. ?

## 2021-05-20 NOTE — Telephone Encounter (Signed)
Called the pt back because I verified her MS medication is in route and she was calling to ask about restarting her adderall medication. I advised I would send this to Dr Epimenio Foot to see if he is ok with restarting back up. The pt would like [facility].  ?

## 2021-05-26 ENCOUNTER — Other Ambulatory Visit (HOSPITAL_COMMUNITY)
Admission: RE | Admit: 2021-05-26 | Discharge: 2021-05-26 | Disposition: A | Discharge status: Home / Self Care | Payer: Medicaid Other | Source: Ambulatory Visit | Attending: Obstetrics & Gynecology | Admitting: Obstetrics & Gynecology

## 2021-05-26 ENCOUNTER — Encounter: Payer: Self-pay | Admitting: Advanced Practice Midwife

## 2021-05-26 ENCOUNTER — Ambulatory Visit (INDEPENDENT_AMBULATORY_CARE_PROVIDER_SITE_OTHER): Payer: Medicaid Other | Admitting: Advanced Practice Midwife

## 2021-05-26 VITALS — BP 142/81 | HR 76 | Ht 64.0 in | Wt 326.8 lb

## 2021-05-26 DIAGNOSIS — Z30014 Encounter for initial prescription of intrauterine contraceptive device: Secondary | ICD-10-CM | POA: Diagnosis not present

## 2021-05-26 DIAGNOSIS — Z30430 Encounter for insertion of intrauterine contraceptive device: Secondary | ICD-10-CM | POA: Diagnosis not present

## 2021-05-26 DIAGNOSIS — N898 Other specified noninflammatory disorders of vagina: Secondary | ICD-10-CM | POA: Insufficient documentation

## 2021-05-26 DIAGNOSIS — Z3202 Encounter for pregnancy test, result negative: Secondary | ICD-10-CM

## 2021-05-26 LAB — POCT URINE PREGNANCY: Preg Test, Ur: NEGATIVE

## 2021-05-26 MED ORDER — LEVONORGESTREL 20.1 MCG/DAY IU IUD
1.0000 | INTRAUTERINE_SYSTEM | Freq: Once | INTRAUTERINE | Status: AC
  Administered 2021-05-26: 1 via INTRAUTERINE

## 2021-05-26 NOTE — Patient Instructions (Signed)
Nothing in vagina for 3 days (no sex, douching, tampons, etc...) Check your strings once a month to make sure you can feel them, if you are not able to please let us know If you develop a fever of 100.4 or more in the next few weeks, or if you develop severe abdominal pain, please let us know Use a backup method of birth control, such as condoms, for 2 weeks  

## 2021-05-26 NOTE — Progress Notes (Signed)
? ?POSTPARTUM VISIT ?Patient name: Brooke [name] MRN [id number]  Date of birth: 1985-09-08 ?Chief Complaint:   ?Postpartum Care and Contraception (IUD insertion) ? ?History of Present Illness:   ?Brooke [name] is a 36 y.o. G30P1041 African American female being seen today for a postpartum visit. She is 6 weeks postpartum following a spontaneous vaginal delivery at 39.1 gestational weeks. IOL: yes, for diabetes mellitus A2/BDM . Anesthesia: epidural.  Laceration: 2nd deg perineal and periurethral.  Complications: none. Inpatient contraception: no.   ?Pregnancy complicated by T7/S DM . ?Tobacco use: former . Substance use disorder: no. ?Last pap smear: March 2023 and results were NILM w/ HRHPV negative. Next pap smear due: March 2026 ?Patient's last menstrual period was 05/17/2021. ? ?Postpartum course has been uncomplicated. Bleeding none. Bowel function is normal. Bladder function is normal. Urinary incontinence? no, fecal incontinence? no ?Patient is not sexually active. Last sexual activity: prior to birth of baby. Desired contraception: IUD. Patient does not know about a pregnancy in the future.  Desired family size is unsure number of children.  ? ?The pregnancy intention screening data noted above was reviewed. Potential methods of contraception were discussed. The patient elected to proceed with No data recorded. ? ?Edinburgh Postpartum Depression Screening: negative ? Edinburgh Postnatal Depression Scale - 05/26/21 1525   ? ?  ? Edinburgh Postnatal Depression Scale:  In the Past 7 Days  ? I have been able to laugh and see the funny side of things. 0   ? I have looked forward with enjoyment to things. 0   ? I have blamed myself unnecessarily when things went wrong. 0   ? I have been anxious or worried for no good reason. 0   ? I have felt scared or panicky for no good reason. 0   ? Things have been getting on top of me. 0   ? I have been so unhappy that I have had difficulty sleeping. 0   ? I have felt  sad or miserable. 0   ? I have been so unhappy that I have been crying. 0   ? The thought of harming myself has occurred to me. 0   ? Edinburgh Postnatal Depression Scale Total 0   ? ?  ?  ? ?  ?  ? ?  05/26/2021  ?  3:25 PM 01/12/2021  ?  8:51 AM 10/14/2020  ? 11:34 AM 10/01/2019  ? 11:59 AM  ?GAD 7 : Generalized Anxiety Score  ?Nervous, Anxious, on Edge 0 0 0 0  ?Control/stop worrying 0 0 0 1  ?Worry too much - different things 0 0 1 1  ?Trouble relaxing 0 1 0 0  ?Restless 0 0 0 0  ?Easily annoyed or irritable 0 1 1 0  ?Afraid - awful might happen 0 0 0 0  ?Total GAD 7 Score 0 2 2 2   ?Anxiety Difficulty    Not difficult at all  ? ? ? ?Baby's course has been uncomplicated. Baby is feeding by bottle. Infant has a pediatrician/family doctor? Yes.  Childcare strategy if returning to work/school: family.  Pt has material needs met for her and baby: Yes.   ?Review of Systems:   ?Pertinent items are noted in HPI ?Denies Abnormal vaginal discharge w/ itching/odor/irritation, headaches, visual changes, shortness of breath, chest pain, abdominal pain, severe nausea/vomiting, or problems with urination or bowel movements. ?Pertinent History Reviewed:  ?Reviewed past medical,surgical, obstetrical and family history.  ?Reviewed problem list, medications and allergies. ?OB  History  ?Gravida Para Term Preterm AB Living  ?5 1 1   4 1   ?SAB IAB Ectopic Multiple Live Births  ?2   2 0 1  ?  ?# Outcome Date GA Lbr Len/2nd Weight Sex Delivery Anes PTL Lv  ?5 Term 04/11/21 [id number]:24 / 00:20 7 lb 5 oz (3.317 kg) F Vag-Spont EPI  LIV  ?4 Ectopic 01/2019          ?3 SAB 2013          ?2 Ectopic 2009          ?1 SAB           ? ?Physical Assessment:  ? ?Vitals:  ? 05/26/21 1518  ?BP: (!) 142/81  ?Pulse: 76  ?Weight: (!) 326 lb 12.8 oz (148.2 kg)  ?Height: 5' 4"  (1.626 m)  ?Body mass index is 56.1 kg/m?. ? ?     Physical Examination:  ? General appearance: alert, well appearing, and in no distress ? Mental status: alert, oriented to person,  place, and time ? Skin: warm & dry  ? Cardiovascular: normal heart rate noted  ? Respiratory: normal respiratory effort, no distress  ? Breasts: deferred, no complaints  ? Abdomen: soft, non-tender  ? Pelvic: normal external genitalia, vulva, vagina, cervix, uterus and adnexa. Thin prep pap obtained: No ? Rectal: not examined ? Extremities: Edema: none  ? ?Chaperone: JLevy Pupa  ?      ?Results for orders placed or performed in visit on 05/26/21 (from the past 24 hour(s))  ?POCT urine pregnancy  ? Collection Time: 05/26/21  3:18 PM  ?Result Value Ref Range  ? Preg Test, Ur Negative Negative  ?  ?Assessment & Plan:  ?1) Postpartum exam ?2) Six wks s/p spontaneous vaginal delivery ?3) bottle feeding ?4) Depression screening ?5) Contraception management: Liletta IUD placed today ? ?Essential components of care per ACOG recommendations: ? ?1.  Mood and well being:  ?If positive depression screen, discussed and plan developed.  ?If using tobacco we discussed reduction/cessation and risk of relapse ?If current substance abuse, we discussed and referral to local resources was offered.  ? ?2. Infant care and feeding:  ?If breastfeeding, discussed returning to work, pumping, breastfeeding-associated pain, guidance regarding return to fertility while lactating if not using another method. If needed, patient was provided with a letter to be allowed to pump q 2-3hrs to support lactation in a private location with access to a refrigerator to store breastmilk.   ?Recommended that all caregivers be immunized for flu, pertussis and other preventable communicable diseases ?If pt does not have material needs met for her/baby, referred to local resources for help obtaining these. ? ?3. Sexuality, contraception and birth spacing ?Provided guidance regarding sexuality, management of dyspareunia, and resumption of intercourse ?Discussed avoiding interpregnancy interval <663ms and recommended birth spacing of 18 months ? ?4. Sleep and  fatigue ?Discussed coping options for fatigue and sleep disruption ?Encouraged family/partner/community support of 4 hrs of uninterrupted sleep to help with mood and fatigue ? ?5. Physical recovery  ?If pt had a C/S, assessed incisional pain and providing guidance on normal vs prolonged recovery ?If pt had a laceration, perineal healing and pain reviewed.  ?If urinary or fecal incontinence, discussed management and referred to PT or uro/gyn if indicated  ?Patient is safe to resume physical activity. Discussed attainment of healthy weight. ? ?6.  Chronic disease management ?Discussed pregnancy complications if any, and their implications for future childbearing and long-term maternal  health. ?Review recommendations for prevention of recurrent pregnancy complications, such as 17 hydroxyprogesterone caproate to reduce risk for recurrent PTB not applicable, or aspirin to reduce risk of preeclampsia not applicable. ?Pt had GDM: yes. If yes, 2hr GTT scheduled: already done with elevated 2hr value (86/150/153). ?Reviewed medications and non-pregnant dosing including consideration of whether pt is breastfeeding using a reliable resource such as LactMed: not applicable ?Referred for f/u w/ PCP or subspecialist providers as indicated: rec est PCP for DM f/u (needs HgbA1c in 6 mos) ? ?7. Health maintenance ?Mammogram at 36yo or earlier if indicated ?Pap smears as indicated ? ?Meds: No orders of the defined types were placed in this encounter. ? ? ?IUD INSERTION ?The risks and benefits of the method and placement have been thouroughly reviewed with the patient and all questions were answered.  Specifically the patient is aware of failure rate of 02/998, expulsion of the IUD and of possible perforation.  The patient is aware of irregular bleeding due to the method and understands the incidence of irregular bleeding diminishes with time.  Signed copy of informed consent in chart.  ? ?Time out was performed. ? ?A Graaves speculum  was placed in the vagina.  The cervix was visualized, prepped using Betadine, and grasped with a single tooth tenaculum. The uterus was found to be retroflexed and it sounded to 7 cm.  ?Liletta  IUD pla

## 2021-05-28 LAB — CERVICOVAGINAL ANCILLARY ONLY
Bacterial Vaginitis (gardnerella): NEGATIVE
Candida Glabrata: NEGATIVE
Candida Vaginitis: POSITIVE — AB
Comment: NEGATIVE
Comment: NEGATIVE
Comment: NEGATIVE

## 2021-05-31 ENCOUNTER — Telehealth: Payer: Self-pay | Admitting: Adult Health

## 2021-05-31 ENCOUNTER — Other Ambulatory Visit: Payer: Self-pay | Admitting: Adult Health

## 2021-05-31 MED ORDER — FLUCONAZOLE 150 MG PO TABS: ORAL_TABLET | ORAL | 1 refills | Status: DC

## 2021-05-31 NOTE — Telephone Encounter (Signed)
I called patient to discuss. No answer, left a message asking her to call us back. 

## 2021-05-31 NOTE — Telephone Encounter (Signed)
Patient returned my call. She reports that she received dimethyl fumarate and has been taking it with no tolerability issues. She will let us know of any further questions or concerns. ?

## 2021-05-31 NOTE — Telephone Encounter (Signed)
Pt aware that Diflucan was sent. She is not breastfeeding.  ?

## 2021-06-11 ENCOUNTER — Other Ambulatory Visit: Payer: Self-pay | Admitting: Advanced Practice Midwife

## 2021-06-11 DIAGNOSIS — Z30431 Encounter for routine checking of intrauterine contraceptive device: Secondary | ICD-10-CM

## 2021-06-14 ENCOUNTER — Ambulatory Visit (INDEPENDENT_AMBULATORY_CARE_PROVIDER_SITE_OTHER): Payer: Medicaid Other

## 2021-06-14 DIAGNOSIS — Z30431 Encounter for routine checking of intrauterine contraceptive device: Secondary | ICD-10-CM | POA: Diagnosis not present

## 2021-06-14 NOTE — Progress Notes (Signed)
US TV: homogeneous anteverted uterus,wnl,EEC 3.8 mm,IUD is centrally located within the endometrium,normal ovaries,ovaries appear mobile,no free fluid,no pain during ultrasound  ?

## 2021-06-25 ENCOUNTER — Encounter: Payer: Self-pay | Admitting: Obstetrics & Gynecology

## 2021-06-25 ENCOUNTER — Ambulatory Visit: Payer: Medicaid Other | Admitting: Obstetrics & Gynecology

## 2021-06-25 VITALS — BP 126/73 | HR 83 | Ht 64.0 in | Wt 334.0 lb

## 2021-06-25 DIAGNOSIS — Z30431 Encounter for routine checking of intrauterine contraceptive device: Secondary | ICD-10-CM

## 2021-06-25 NOTE — Progress Notes (Signed)
? ?  GYN VISIT ?Patient name: Brooke [name] MRN [id number]  Date of birth: 07-23-1985 ?Chief Complaint:   ?Follow-up ? ?History of Present Illness:   ?Brooke [name] is a 36 y.o. [phone]  female being seen today for IUD check up.    ? ? ?Liletta placed in March following recently delivery.  Menses are not that bad- last about a week.  Denies HMB or dysmenrrhea.  Overall happy with device. ? ?Recent US 4/24- confirmed IUD in central location. ? ?Patient's last menstrual period was 06/19/2021 (exact date). ? ? ?  05/26/2021  ?  3:25 PM 01/12/2021  ?  8:51 AM 12/18/2020  ?  2:16 AM 10/14/2020  ? 11:33 AM 10/01/2019  ? 11:58 AM  ?Depression screen PHQ 2/9  ?Decreased Interest 0 2 0 1 0  ?Down, Depressed, Hopeless 0 0 0 0 0  ?PHQ - 2 Score 0 2 0 1 0  ?Altered sleeping 0 1  0 1  ?Tired, decreased energy 0 1  1 3   ?Change in appetite 0 0  0 0  ?Feeling bad or failure about yourself  0 0  0 0  ?Trouble concentrating 0 0  0 0  ?Moving slowly or fidgety/restless 0 0  0 0  ?Suicidal thoughts 0 0  0 0  ?PHQ-9 Score 0 4  2 4   ?Difficult doing work/chores     Not difficult at all  ? ? ? ?Review of Systems:   ?Pertinent items are noted in HPI ?Denies fever/chills, dizziness, headaches, visual disturbances, fatigue, shortness of breath, chest pain, abdominal pain, vomiting, no problems with periods, bowel movements, urination, or intercourse unless otherwise stated above.  ?Pertinent History Reviewed:  ?Reviewed past medical,surgical, social, obstetrical and family history.  ?Reviewed problem list, medications and allergies. ?Physical Assessment:  ? ?Vitals:  ? 06/25/21 1244  ?BP: 126/73  ?Pulse: 83  ?Weight: (!) 334 lb (151.5 kg)  ?Height: 5\' 4"  (1.626 m)  ?Body mass index is 57.33 kg/m?. ? ?     Physical Examination:  ? General appearance: alert, well appearing, and in no distress ? Psych: mood appropriate, normal affect ? Skin: warm & dry  ? Cardiovascular: normal heart rate noted ? Respiratory: normal respiratory effort, no  distress ? Abdomen: obese, soft, non-tender  ? Pelvic: normal external genitalia, vulva, vagina- currently on menses, cervix seen with strings noted at os ? Extremities: no calf tenderness bilaterally ? ?Chaperone: Neas   ? ?Assessment & Plan:  ?1) IUD check up ?-device in proper location ?-encouraged self checks ?-f/u in 45yr for annual ? ?Return in about 1 year (around 06/26/2022) for March 2024 annual. ? ? ?3yr, DO ?Attending Obstetrician & Gynecologist, Faculty Practice ?Center for 08/26/2022, [facility] ? ? ? ?

## 2021-06-28 ENCOUNTER — Other Ambulatory Visit: Payer: Self-pay | Admitting: Women's Health

## 2021-06-28 ENCOUNTER — Other Ambulatory Visit: Payer: Self-pay | Admitting: Neurology

## 2021-06-28 ENCOUNTER — Telehealth: Payer: Self-pay | Admitting: Orthopaedic Surgery

## 2021-07-07 ENCOUNTER — Other Ambulatory Visit: Payer: Self-pay | Admitting: Neurology

## 2021-07-07 MED ORDER — AMPHETAMINE-DEXTROAMPHET ER 30 MG PO CP24: 30.0000 mg | ORAL_CAPSULE | Freq: Every day | ORAL | 0 refills | Status: DC

## 2021-07-07 NOTE — Telephone Encounter (Signed)
Pt request refill for amphetamine-dextroamphetamine (ADDERALL XR) 30 MG 24 hr capsule at [id number]  ?

## 2021-10-11 ENCOUNTER — Other Ambulatory Visit: Payer: Self-pay | Admitting: Neurology

## 2021-10-19 ENCOUNTER — Ambulatory Visit: Payer: Medicaid Other | Admitting: Neurology

## 2021-10-20 ENCOUNTER — Ambulatory Visit: Payer: Medicaid Other | Admitting: Neurology

## 2021-10-20 ENCOUNTER — Encounter: Payer: Self-pay | Admitting: Neurology

## 2021-10-20 VITALS — BP 130/73 | HR 56 | Ht 65.0 in | Wt 355.0 lb

## 2021-10-20 DIAGNOSIS — R29898 Other symptoms and signs involving the musculoskeletal system: Secondary | ICD-10-CM | POA: Diagnosis not present

## 2021-10-20 DIAGNOSIS — G35 Multiple sclerosis: Secondary | ICD-10-CM | POA: Diagnosis not present

## 2021-10-20 DIAGNOSIS — R269 Unspecified abnormalities of gait and mobility: Secondary | ICD-10-CM | POA: Diagnosis not present

## 2021-10-20 DIAGNOSIS — R200 Anesthesia of skin: Secondary | ICD-10-CM | POA: Diagnosis not present

## 2021-10-20 DIAGNOSIS — R739 Hyperglycemia, unspecified: Secondary | ICD-10-CM

## 2021-10-20 DIAGNOSIS — Z79899 Other long term (current) drug therapy: Secondary | ICD-10-CM

## 2021-10-20 MED ORDER — HYDROCHLOROTHIAZIDE 12.5 MG PO CAPS
12.5000 mg | ORAL_CAPSULE | Freq: Every day | ORAL | 3 refills | Status: DC
Start: 2021-10-20 — End: 2023-11-06

## 2021-10-20 MED ORDER — GABAPENTIN 600 MG PO TABS: 600.0000 mg | ORAL_TABLET | Freq: Three times a day (TID) | ORAL | 11 refills | Status: DC

## 2021-10-20 MED ORDER — AMPHETAMINE-DEXTROAMPHET ER 30 MG PO CP24: 30.0000 mg | ORAL_CAPSULE | Freq: Every day | ORAL | 0 refills | Status: DC

## 2021-10-20 NOTE — Progress Notes (Signed)
[facility]  PATIENT: Brooke [name] DOB: 1985/08/23  REFERRING DOCTOR OR PCP:  Dr. Vickey Huger; PCP is Fleet Contras SOURCE: Patient, notes from Dr. Vickey Huger, imaging and laboratory reports, MRI images on PACS.  _________________________________   HISTORICAL  CHIEF COMPLAINT:  Chief Complaint  Patient presents with   Multiple Sclerosis    Rm 2, 6 month F, [place]  "fell Sat- left leg gives out, is numb and retains fluid, get bruises on my legs, leg is uncomfortable"     HISTORY OF PRESENT ILLNESS:  Brooke [name] is a 36 y.o.  woman with multiple sclerosis.      Update 10/20/2021: She restarted Tecfidera about 03/2021 after her daughter was born.    She was tolerating it well.    The left leg is swelling and is going numb.   She feels her gait has changed and is off balanced.   Her left leg is weak.   Arms are strong.    The left leg was weaker than right but the numbness is definitely more recent and came on more subacute..  The increased weakness if gradual.       She is numb in the leg with dysesthesias  - needles and pins or burning.    She takes gabapentin 300 mg po tid and tolerates it well.     She is experiencing frontal headaches, also left occipital pain.   NSAIDs helps a  little bit.     Gait is off due to reduced balance   She stumbled more towards the end of pregnancy but also noted a lot of edema.     The  left leg was weaker than right - but she feels about the same now.  Ampyra had no benefit.   She has some tingling in her fingertips. No major numbness or dysesthesias.   Gabapentin is doing well.     She has urinary urge incontinence that did better with solifenacin - works better than oxybutynin.   It was stopped during pregnancy.       She has some fatigue.  She is sleeping poorly waking up for the bay every few hours.  She falls back asleep ok as very tired.  She denies depression but has some anxiety.  She takes xanax prn.    Cognition  is doing ok.     We discussed taking vit D supplements    MS History She was diagnosed with MS in 2016 after presenting with visual disturbance, diplopia and poor gait.    She was initially placed on Tecfidera.   She did not note any benefit desired to switch medications and started Ocrevus.  She did not feel good on that medication.  She switched to see me in 2019.  We switched to Tysabri.  Unfortunately, she had allergic reaction after her second infusion and went back on Tecfidera.  She was off for several months while pregnant but went back on after miscarriage and December 2020  Imaging MRI of the brain 05/13/2016 showed multiple T2/FLAIR hyperintense foci in the pons and hemispheres in a pattern and configuration consistent with chronic demyelinating plaque associated with multiple sclerosis. None of the foci appears to be acute. When compared to the MRI dated 01/11/2015, there is no interval change.    There is a normal enhancement pattern and there are no acute findings.  MRI of the cervical spine 05/13/2016 Foci within the posterior spinal cord adjacent to C3 and the left spinal cord adjacent  to C5.   Neither of these appear to be acute.      Minimal disc degenerative changes at C5-C6 and C6-C7 that did not lead to any nerve root impingement.    There is a normal enhancement pattern and there are no acute findings.  MRI of the thoracic spine 05/16/2016 showed a focus within the left anterior spinal cord adjacent to T8-T9 consistent with a demyelinating plaque associated with multiple sclerosis. It appears to be chronic and does not enhance. There is a small focus to the right adjacent to T3-T4.   It is noted on the axial images but not the sagittal images and could represent a small chronic demyelinating plaque or be artifactual. There is small right lateral disc protrusion at T11-T12. There is no nerve root compression..   There is a normal enhancement pattern.  MRI of the cervical spine  08/07/2019 showed a T2 hyperintense foci within the spinal cord adjacent to C2, C3 and C5.  None of the foci appear to be acute.  They do not enhance.  They were present on the MRI from 05/12/2016.   Mild disc degenerative changes at C5-C6 and C6-C7 that do not lead to spinal stenosis or nerve root compression.   MRI of the brain 08/09/2019 showed multiple T2/FLAIR hyperintense foci in the pons and hemispheres in a pattern configuration consistent with chronic demyelinating plaque associated with multiple sclerosis.  None of the foci appear to be acute and they do not enhance.  Compared to the MRI dated 05/12/2016, there are no definite new lesions.  No acute findings and a normal enhancement pattern.  MRI brain 08/07/2019 shows multiple T2/FLAIR hyperintense foci in the pons and hemispheres in a pattern configuration consistent with chronic demyelinating plaque associated with multiple sclerosis.  None of the foci appear to be acute and they do not enhance.  Compared to the MRI dated 05/12/2016, there are no definite new lesions  MRI cervical spine 08/09/2019  T2 hyperintense foci within the spinal cord adjacent to C2, C3 and C5.  None of the foci appear to be acute.  They do not enhance.  They were present on the MRI from 05/12/2016. 2.   Mild disc degenerative changes at C5-C6 and C6-C7 that do not lead to spinal stenosis or nerve root compression.  REVIEW OF SYSTEMS: Constitutional: No fevers, chills, sweats, or change in appetite.  She notes fatigue. Eyes: No visual changes, double vision, eye pain Ear, nose and throat: No hearing loss, ear pain, nasal congestion, sore throat Cardiovascular: No chest pain, palpitations Respiratory:  No shortness of breath at rest or with exertion.   No wheezes GastrointestinaI: No nausea, vomiting, diarrhea, abdominal pain, fecal incontinence Genitourinary:  No dysuria, urinary retention or frequency.  No nocturia. Musculoskeletal:  No neck pain, back  pain Integumentary: No rash, pruritus, skin lesions Neurological: as above Psychiatric: She denies depression or anxiety.   Endocrine: No palpitations, diaphoresis, change in appetite, change in weigh or increased thirst Hematologic/Lymphatic:  No anemia, purpura, petechiae. Allergic/Immunologic: No itchy/runny eyes, nasal congestion, recent allergic reactions, rashes  ALLERGIES: Allergies  Allergen Reactions   Lexapro [Escitalopram Oxalate] Other (See Comments)    Suicidal thoughts, lowered libido   Tysabri [Natalizumab] Hives    Hives, itching, elevated HR and BP.   Latex Hives    HOME MEDICATIONS:  Current Outpatient Medications:    acetaminophen (TYLENOL) 500 MG tablet, Take 2 tablets (1,000 mg total) by mouth every 8 (eight) hours as needed (pain)., Disp: 60  tablet, Rfl: 0   Blood Pressure Monitor MISC, For regular home bp monitoring during pregnancy, Disp: 1 each, Rfl: 0   Dimethyl Fumarate (TECFIDERA) 240 MG CPDR, Take 1 capsule (240 mg total) by mouth 2 (two) times daily., Disp: 60 capsule, Rfl: 11   fluconazole (DIFLUCAN) 150 MG tablet, Take 1 now and 1 in 3 days if needed, Disp: 2 tablet, Rfl: 1   gabapentin (NEURONTIN) 600 MG tablet, Take 1 tablet (600 mg total) by mouth 3 (three) times daily., Disp: 90 tablet, Rfl: 11   ibuprofen (ADVIL) 600 MG tablet, Take 1 tablet (600 mg total) by mouth every 6 (six) hours as needed (pain)., Disp: 40 tablet, Rfl: 0   naproxen (NAPROSYN) 500 MG tablet, TAKE 1 TABLET BY MOUTH 2 TIMES DAILY WITH A MEAL, Disp: 60 tablet, Rfl: 5   Prenatal Vit-Fe Fumarate-FA (PNV PRENATAL PLUS MULTIVITAMIN) 27-1 MG TABS, Take 1 daily, Disp: 30 tablet, Rfl: 12   solifenacin (VESICARE) 5 MG tablet, TAKE 1 TABLET (5 MG TOTAL) BY MOUTH DAILY., Disp: 90 tablet, Rfl: 1   amphetamine-dextroamphetamine (ADDERALL XR) 30 MG 24 hr capsule, Take 1 capsule (30 mg total) by mouth daily., Disp: 30 capsule, Rfl: 0   hydrochlorothiazide (MICROZIDE) 12.5 MG capsule, Take 1  capsule (12.5 mg total) by mouth daily., Disp: 90 capsule, Rfl: 3  PAST MEDICAL HISTORY: Past Medical History:  Diagnosis Date   Anxiety    Chlamydia 02/28/2020   Treated 02/28/20 POC________   Chronic back pain    Depression    Diabetes mellitus without complication (HCC)    Edema    Gestational diabetes    MS (multiple sclerosis) (HCC) 09/29/2014    PAST SURGICAL HISTORY: Past Surgical History:  Procedure Laterality Date   APPENDECTOMY     CHOLECYSTECTOMY N/A 10/07/2013   Procedure: LAPAROSCOPIC CHOLECYSTECTOMY;  Surgeon: Dalia Heading, MD;  Location: [facility];  Service: General;  Laterality: N/A;   LAPAROSCOPIC APPENDECTOMY N/A 05/12/2015   Procedure: APPENDECTOMY LAPAROSCOPIC;  Surgeon: Franky Macho, MD;  Location: [facility];  Service: General;  Laterality: N/A;   WISDOM TOOTH EXTRACTION      FAMILY HISTORY: Family History  Problem Relation Age of Onset   Diabetes Mother    Colon polyps Mother        hx of cancer   Other Mother        DDD Lumber, cervical   Sleep apnea Father     SOCIAL HISTORY:  Social History   Socioeconomic History   Marital status: Single    Spouse name: Not on file   Number of children: 1   Years of education: 2 y colleg   Highest education level: Not on file  Occupational History   Occupation: work  Tobacco Use   Smoking status: Former    Types: Cigarettes, E-cigarettes    Quit date: 09/16/2007    Years since quitting: 14.1   Smokeless tobacco: Never  Vaping Use   Vaping Use: Former  Substance and Sexual Activity   Alcohol use: Not Currently   Drug use: Not Currently    Types: Marijuana    Comment: occ   Sexual activity: Yes    Birth control/protection: I.U.D.  Other Topics Concern   Not on file  Social History Narrative   Drinks 1-2 large glasses of caffeine daily.      Social Determinants of Health   Financial Resource Strain: Low Risk  (05/05/2021)   Overall Financial Resource Strain (CARDIA)    Difficulty of  Paying Living  Expenses: Not very hard  Food Insecurity: No Food Insecurity (05/05/2021)   Hunger Vital Sign    Worried About Running Out of Food in the Last Year: Never true    Ran Out of Food in the Last Year: Never true  Transportation Needs: No Transportation Needs (05/05/2021)   PRAPARE - Administrator, Civil Service (Medical): No    Lack of Transportation (Non-Medical): No  Physical Activity: Insufficiently Active (05/05/2021)   Exercise Vital Sign    Days of Exercise per Week: 2 days    Minutes of Exercise per Session: 30 min  Stress: No Stress Concern Present (05/05/2021)   [employer] of Occupational Health - Occupational Stress Questionnaire    Feeling of Stress : Not at all  Social Connections: Moderately Integrated (05/05/2021)   Social Connection and Isolation Panel [NHANES]    Frequency of Communication with Friends and Family: More than three times a week    Frequency of Social Gatherings with Friends and Family: Three times a week    Attends Religious Services: More than 4 times per year    Active Member of Clubs or Organizations: Yes    Attends Banker Meetings: More than 4 times per year    Marital Status: Widowed  Intimate Partner Violence: Not At Risk (05/05/2021)   Humiliation, Afraid, Rape, and Kick questionnaire    Fear of Current or Ex-Partner: No    Emotionally Abused: No    Physically Abused: No    Sexually Abused: No     PHYSICAL EXAM  Vitals:   10/20/21 0917  BP: 130/73  Pulse: (!) 56  Weight: (!) 355 lb (161 kg)  Height: 5\' 5"  (1.651 m)     Body mass index is 59.08 kg/m.   General: The patient is well-developed and well-nourished and in no acute distress   Skin: Extremities are without rash .   Mild edema   Neurologic Exam  Mental status: The patient is alert and oriented x 3 at the time of the examination. The patient has apparent normal recent and remote memory, with an apparently normal attention span and concentration  ability.   Speech is normal.  Cranial nerves: Extraocular movements are full.  Facial strength and sensation is normal.  Trapezius strength is normal.. No obvious hearing deficits are noted.  Motor:  Muscle bulk is normal.   Tone is mildly increased in the left leg.  Strength was 4+/5 in the left leg and 5/5 on the right.  Sensory: She has normal and symmetric sensation to touch in arms/legs.  Coordination: Cerebellar testing reveals good finger-nose-finger and heel-to-shin bilaterally.  Gait and station: Station is normal.  Mild left foot drop.  Her tandem gait is poor..    Romberg is negative..   Reflexes: Deep tendon reflexes are symmetric and normal in the arms and increased at the knees.     DIAGNOSTIC DATA (LABS, IMAGING, TESTING) - I reviewed patient records, labs, notes, testing and imaging myself where available.  Lab Results  Component Value Date   WBC 7.5 04/20/2021   HGB 10.4 (L) 04/20/2021   HCT 29.0 (L) 04/20/2021   MCV 82 04/20/2021   PLT 303 04/20/2021      Component Value Date/Time   NA 144 04/20/2021 1002   K 3.8 04/20/2021 1002   CL 106 04/20/2021 1002   CO2 25 04/20/2021 1002   GLUCOSE 97 04/20/2021 1002   GLUCOSE 98 02/25/2018 1650   BUN 8  04/20/2021 1002   CREATININE 0.76 04/20/2021 1002   CALCIUM 8.5 (L) 04/20/2021 1002   PROT 6.6 04/20/2021 1002   ALBUMIN 3.5 (L) 04/20/2021 1002   AST 13 04/20/2021 1002   ALT 11 04/20/2021 1002   ALKPHOS 139 (H) 04/20/2021 1002   BILITOT 0.3 04/20/2021 1002   GFRNONAA 117 07/03/2019 1624   GFRAA 135 07/03/2019 1624   ____________________________________________  Assessment and plan:  Multiple sclerosis (HCC) - Plan: MR BRAIN W WO CONTRAST, MR CERVICAL SPINE W WO CONTRAST, MR THORACIC SPINE W WO CONTRAST, CBC with Differential/Platelet, Comprehensive metabolic panel  Left leg weakness - Plan: MR BRAIN W WO CONTRAST, MR CERVICAL SPINE W WO CONTRAST, MR THORACIC SPINE W WO CONTRAST  Leg numbness - Plan: MR  BRAIN W WO CONTRAST, MR CERVICAL SPINE W WO CONTRAST, MR THORACIC SPINE W WO CONTRAST  Gait disturbance - Plan: MR BRAIN W WO CONTRAST, MR CERVICAL SPINE W WO CONTRAST, MR THORACIC SPINE W WO CONTRAST  High risk medication use - Plan: CBC with Differential/Platelet, Comprehensive metabolic panel, Hemoglobin A1c  Hyperglycemia - Plan: Hemoglobin A1c   1.    Continue Tecfidera. Check Hep function panel and CBC with differential today   we will check the MRI of the brain and the spine to determine if there has been additional breakthrough activity.  If present, consider a more efficacious disease modifying therapy such as natalizumab or an anti-CD20 agent  2.    Continue Vesicare for urge incontinence    HCTZ for edema.  Adderall for fatigue/MS cognitive changes 3.    Stay active and exercise as tolerated.   4.    We also had a discussion about weight loss.  She has gained a few pounds over the last year.  She had hyperglycemia while pregnant.   Return in 6 months she should call sooner if there are new or worsening neurologic symptoms.  40-minute office visit with the majority of the time spent face-to-face for history and physical, discussion/counseling and decision-making.  Additional time with record review and documentation.   Simmie Camerer A. Epimenio Foot, MD, PhD, FAAN Certified in Neurology, Clinical Neurophysiology, Sleep Medicine, Pain Medicine and Neuroimaging Director, [facility] at [facility]  [facility] [address], [address] [place], [place] [address] [phone]

## 2021-10-21 LAB — COMPREHENSIVE METABOLIC PANEL
ALT: 12 IU/L (ref 0–32)
AST: 10 IU/L (ref 0–40)
Albumin/Globulin Ratio: 1.2 (ref 1.2–2.2)
Albumin: 3.7 g/dL — ABNORMAL LOW (ref 3.9–4.9)
Alkaline Phosphatase: 95 IU/L (ref 44–121)
BUN/Creatinine Ratio: 14 (ref 9–23)
BUN: 11 mg/dL (ref 6–20)
Bilirubin Total: <0.2 mg/dL (ref 0.0–1.2)
CO2: 25 mmol/L (ref 20–29)
Calcium: 8.8 mg/dL (ref 8.7–10.2)
Chloride: 105 mmol/L (ref 96–106)
Creatinine, Ser: 0.77 mg/dL (ref 0.57–1.00)
Globulin, Total: 3.2 g/dL (ref 1.5–4.5)
Glucose: 108 mg/dL — ABNORMAL HIGH (ref 70–99)
Potassium: 4.4 mmol/L (ref 3.5–5.2)
Sodium: 140 mmol/L (ref 134–144)
Total Protein: 6.9 g/dL (ref 6.0–8.5)
eGFR: 104 mL/min/{1.73_m2} (ref >59)

## 2021-10-21 LAB — HEMOGLOBIN A1C
Est. average glucose Bld gHb Est-mCnc: 137 mg/dL
Hgb A1c MFr Bld: 6.4 % — ABNORMAL HIGH (ref 4.8–5.6)

## 2021-10-21 LAB — CBC WITH DIFFERENTIAL/PLATELET
Basophils Absolute: 0 10*3/uL (ref 0.0–0.2)
Basos: 0 %
EOS (ABSOLUTE): 0.1 10*3/uL (ref 0.0–0.4)
Eos: 2 %
Hematocrit: 32.7 % — ABNORMAL LOW (ref 34.0–46.6)
Hemoglobin: 11.5 g/dL (ref 11.1–15.9)
Immature Grans (Abs): 0 10*3/uL (ref 0.0–0.1)
Immature Granulocytes: 0 %
Lymphocytes Absolute: 1.6 10*3/uL (ref 0.7–3.1)
Lymphs: 47 %
MCH: 28.8 pg (ref 26.6–33.0)
MCHC: 35.2 g/dL (ref 31.5–35.7)
MCV: 82 fL (ref 79–97)
Monocytes Absolute: 0.4 10*3/uL (ref 0.1–0.9)
Monocytes: 11 %
Neutrophils Absolute: 1.4 10*3/uL (ref 1.4–7.0)
Neutrophils: 40 %
Platelets: 251 10*3/uL (ref 150–450)
RBC: 3.99 x10E6/uL (ref 3.77–5.28)
RDW: 14.2 % (ref 11.7–15.4)
WBC: 3.4 10*3/uL (ref 3.4–10.8)

## 2021-11-08 ENCOUNTER — Telehealth: Payer: Self-pay | Admitting: Neurology

## 2021-11-08 NOTE — Telephone Encounter (Signed)
Brain: Medicaid Jackquline [place] Josem Kaufmann: [id number] exp. 11/04/21-01/03/22 sent to GI  Cervical: Medicaid Jackquline [place] Josem Kaufmann: [id number] exp. 11/04/21-01/03/22  Thoracic: Medicaid Jackquline [place] Josem Kaufmann: [id number] exp. 11/04/21-01/03/22

## 2021-11-27 ENCOUNTER — Other Ambulatory Visit: Payer: Medicaid Other

## 2021-12-16 ENCOUNTER — Other Ambulatory Visit: Payer: Self-pay | Admitting: Neurology

## 2021-12-16 DIAGNOSIS — G35 Multiple sclerosis: Secondary | ICD-10-CM

## 2021-12-16 DIAGNOSIS — G35D Multiple sclerosis, unspecified: Secondary | ICD-10-CM

## 2021-12-16 DIAGNOSIS — R296 Repeated falls: Secondary | ICD-10-CM

## 2021-12-31 ENCOUNTER — Ambulatory Visit
Admission: RE | Admit: 2021-12-31 | Discharge: 2021-12-31 | Disposition: A | Discharge status: Home / Self Care | Payer: Medicaid Other | Source: Ambulatory Visit | Attending: Neurology | Admitting: Neurology

## 2021-12-31 ENCOUNTER — Encounter: Payer: Self-pay | Admitting: Neurology

## 2021-12-31 DIAGNOSIS — R269 Unspecified abnormalities of gait and mobility: Secondary | ICD-10-CM

## 2021-12-31 DIAGNOSIS — R29898 Other symptoms and signs involving the musculoskeletal system: Secondary | ICD-10-CM

## 2021-12-31 DIAGNOSIS — R200 Anesthesia of skin: Secondary | ICD-10-CM

## 2021-12-31 DIAGNOSIS — G35 Multiple sclerosis: Secondary | ICD-10-CM

## 2021-12-31 MED ORDER — GADOPICLENOL 0.5 MMOL/ML IV SOLN
10.0000 mL | Freq: Once | INTRAVENOUS | Status: AC | PRN
  Administered 2021-12-31: 10 mL via INTRAVENOUS

## 2022-04-04 ENCOUNTER — Other Ambulatory Visit: Payer: Self-pay | Admitting: Neurology

## 2022-04-04 DIAGNOSIS — G35 Multiple sclerosis: Secondary | ICD-10-CM

## 2022-04-04 NOTE — Telephone Encounter (Signed)
Last seen 09/2021 No 6 month follow up

## 2022-04-21 ENCOUNTER — Encounter: Payer: Self-pay | Admitting: Radiology

## 2022-04-24 ENCOUNTER — Telehealth: Payer: Self-pay | Admitting: Orthopaedic Surgery

## 2022-07-21 ENCOUNTER — Other Ambulatory Visit: Payer: Self-pay | Admitting: Neurology

## 2022-07-21 MED ORDER — AMPHETAMINE-DEXTROAMPHET ER 30 MG PO CP24: 30.0000 mg | ORAL_CAPSULE | Freq: Every day | ORAL | 0 refills | Status: DC

## 2022-07-21 NOTE — Addendum Note (Signed)
Addended by: Arther Abbott on: 07/21/2022 10:49 AM   Modules accepted: Orders

## 2022-07-21 NOTE — Telephone Encounter (Signed)
Pt last seen 10/21/22 and next f/u 09/08/22. Last refilled adderall xr 30mg  10/20/21 #30.

## 2022-07-21 NOTE — Telephone Encounter (Signed)
Pt is requesting a refill for amphetamine-dextroamphetamine (ADDERALL XR) 30 MG 24 hr capsule .  Pharmacy: [facility] Store ID: [id number]

## 2022-09-08 ENCOUNTER — Ambulatory Visit: Payer: Medicaid Other | Admitting: Neurology

## 2022-09-08 ENCOUNTER — Encounter: Payer: Self-pay | Admitting: Neurology

## 2022-09-29 ENCOUNTER — Ambulatory Visit (INDEPENDENT_AMBULATORY_CARE_PROVIDER_SITE_OTHER): Payer: Medicaid Other | Admitting: Neurology

## 2022-09-29 ENCOUNTER — Encounter: Payer: Self-pay | Admitting: Neurology

## 2022-09-29 VITALS — BP 103/66 | HR 71 | Ht 64.0 in | Wt 347.0 lb

## 2022-09-29 DIAGNOSIS — R200 Anesthesia of skin: Secondary | ICD-10-CM | POA: Diagnosis not present

## 2022-09-29 DIAGNOSIS — E559 Vitamin D deficiency, unspecified: Secondary | ICD-10-CM

## 2022-09-29 DIAGNOSIS — G35 Multiple sclerosis: Secondary | ICD-10-CM | POA: Diagnosis not present

## 2022-09-29 DIAGNOSIS — Z79899 Other long term (current) drug therapy: Secondary | ICD-10-CM | POA: Diagnosis not present

## 2022-09-29 DIAGNOSIS — G8929 Other chronic pain: Secondary | ICD-10-CM

## 2022-09-29 DIAGNOSIS — M25562 Pain in left knee: Secondary | ICD-10-CM

## 2022-09-29 MED ORDER — NORTRIPTYLINE HCL 25 MG PO CAPS: 25.0000 mg | ORAL_CAPSULE | Freq: Every day | ORAL | 3 refills | Status: DC

## 2022-09-29 MED ORDER — BUSPIRONE HCL 15 MG PO TABS: 15.0000 mg | ORAL_TABLET | Freq: Two times a day (BID) | ORAL | 5 refills | Status: DC

## 2022-09-29 NOTE — Progress Notes (Signed)
[facility]  PATIENT: Brooke [name] DOB: 1986/01/12  REFERRING DOCTOR OR PCP:  Dr. Vickey Huger; PCP is Fleet Contras SOURCE: Patient, notes from Dr. Vickey Huger, imaging and laboratory reports, MRI images on PACS.  _________________________________   HISTORICAL  CHIEF COMPLAINT:  Chief Complaint  Patient presents with   Follow-up    Pt in room 10. Here for MS follow up. Pt said she fell 4-5 times about 2-3 weeks ago, fell on knees, feels burning in left knee only.  Pt reports numbness/tingling in both leg. Pt said finger tips are numb as well.     HISTORY OF PRESENT ILLNESS:  Brooke [name] is a 37 y.o.  woman with multiple sclerosis.      Update 09/29/2022: She is on Tecfidera (restarted 03/2021 after her daughter was born.    She is tolerating it well.    Gait is off due to reduced balance   The  left leg was weaker than right - but she feels about the same now.  Ampyra had no benefit.   She has some tingling in her fingertips.   Arms strong.     She is numb in the leg with dysesthesias  - needles and pins or burning.   She fell a few times on the left knee and now has a patch of numbness below inner knee associated with burning painful dysesthesias.  She takes gabapentin 300 mg po tid and tolerates it well.     She is experiencing frontal headaches, also left occipital pain.   NSAIDs helps a  little bit.     She has urinary urge incontinence that did better with solifenacin - works better than oxybutynin.      She has some fatigue.  She is sleeping poorly waking up for the bay every few hours.  She falls back asleep ok as very tired.    She denies depression but has some anxiety.  She takes xanax prn.    Cognition is doing ok.     We discussed taking vit D supplements    MS History She was diagnosed with MS in 2016 after presenting with visual disturbance, diplopia and poor gait.    She was initially placed on Tecfidera.   She did not note any benefit  desired to switch medications and started Ocrevus.  She did not feel good on that medication.  She switched to see me in 2019.  We switched to Tysabri.  Unfortunately, she had allergic reaction after her second infusion and went back on Tecfidera.  She was off for several months while pregnant but went back on after miscarriage and December 2020  Imaging MRI of the brain 05/13/2016 showed multiple T2/FLAIR hyperintense foci in the pons and hemispheres in a pattern and configuration consistent with chronic demyelinating plaque associated with multiple sclerosis. None of the foci appears to be acute. When compared to the MRI dated 01/11/2015, there is no interval change.    There is a normal enhancement pattern and there are no acute findings.  MRI of the cervical spine 05/13/2016 Foci within the posterior spinal cord adjacent to C3 and the left spinal cord adjacent to C5.   Neither of these appear to be acute.      Minimal disc degenerative changes at C5-C6 and C6-C7 that did not lead to any nerve root impingement.    There is a normal enhancement pattern and there are no acute findings.  MRI of the thoracic spine 05/16/2016 showed a focus  within the left anterior spinal cord adjacent to T8-T9 consistent with a demyelinating plaque associated with multiple sclerosis. It appears to be chronic and does not enhance. There is a small focus to the right adjacent to T3-T4.   It is noted on the axial images but not the sagittal images and could represent a small chronic demyelinating plaque or be artifactual. There is small right lateral disc protrusion at T11-T12. There is no nerve root compression..   There is a normal enhancement pattern.  MRI of the cervical spine 08/07/2019 showed a T2 hyperintense foci within the spinal cord adjacent to C2, C3 and C5.  None of the foci appear to be acute.  They do not enhance.  They were present on the MRI from 05/12/2016.   Mild disc degenerative changes at C5-C6 and C6-C7 that  do not lead to spinal stenosis or nerve root compression.   MRI of the brain 08/09/2019 showed multiple T2/FLAIR hyperintense foci in the pons and hemispheres in a pattern configuration consistent with chronic demyelinating plaque associated with multiple sclerosis.  None of the foci appear to be acute and they do not enhance.  Compared to the MRI dated 05/12/2016, there are no definite new lesions.  No acute findings and a normal enhancement pattern.  MRI brain 08/07/2019 shows multiple T2/FLAIR hyperintense foci in the pons and hemispheres in a pattern configuration consistent with chronic demyelinating plaque associated with multiple sclerosis.  None of the foci appear to be acute and they do not enhance.  Compared to the MRI dated 05/12/2016, there are no definite new lesions  MRI cervical spine 08/09/2019  T2 hyperintense foci within the spinal cord adjacent to C2, C3 and C5.  None of the foci appear to be acute.  They do not enhance.  They were present on the MRI from 05/12/2016. 2.   Mild disc degenerative changes at C5-C6 and C6-C7 that do not lead to spinal stenosis or nerve root compression.  REVIEW OF SYSTEMS: Constitutional: No fevers, chills, sweats, or change in appetite.  She notes fatigue. Eyes: No visual changes, double vision, eye pain Ear, nose and throat: No hearing loss, ear pain, nasal congestion, sore throat Cardiovascular: No chest pain, palpitations Respiratory:  No shortness of breath at rest or with exertion.   No wheezes GastrointestinaI: No nausea, vomiting, diarrhea, abdominal pain, fecal incontinence Genitourinary:  No dysuria, urinary retention or frequency.  No nocturia. Musculoskeletal:  No neck pain, back pain Integumentary: No rash, pruritus, skin lesions Neurological: as above Psychiatric: She denies depression or anxiety.   Endocrine: No palpitations, diaphoresis, change in appetite, change in weigh or increased thirst Hematologic/Lymphatic:  No anemia, purpura,  petechiae. Allergic/Immunologic: No itchy/runny eyes, nasal congestion, recent allergic reactions, rashes  ALLERGIES: Allergies  Allergen Reactions   Lexapro [Escitalopram Oxalate] Other (See Comments)    Suicidal thoughts, lowered libido   Tysabri [Natalizumab] Hives    Hives, itching, elevated HR and BP.   Latex Hives    HOME MEDICATIONS:  Current Outpatient Medications:    acetaminophen (TYLENOL) 500 MG tablet, Take 2 tablets (1,000 mg total) by mouth every 8 (eight) hours as needed (pain)., Disp: 60 tablet, Rfl: 0   amphetamine-dextroamphetamine (ADDERALL XR) 30 MG 24 hr capsule, Take 1 capsule (30 mg total) by mouth daily., Disp: 30 capsule, Rfl: 0   Blood Pressure Monitor MISC, For regular home bp monitoring during pregnancy, Disp: 1 each, Rfl: 0   busPIRone (BUSPAR) 15 MG tablet, Take 1 tablet (15 mg total)  by mouth 2 (two) times daily., Disp: 60 tablet, Rfl: 5   Dimethyl Fumarate 240 MG CPDR, TAKE 1 CAPSULE TWICE A DAY, Disp: 60 capsule, Rfl: 0   fluconazole (DIFLUCAN) 150 MG tablet, Take 1 now and 1 in 3 days if needed, Disp: 2 tablet, Rfl: 1   gabapentin (NEURONTIN) 600 MG tablet, Take 1 tablet (600 mg total) by mouth 3 (three) times daily., Disp: 90 tablet, Rfl: 11   hydrochlorothiazide (MICROZIDE) 12.5 MG capsule, Take 1 capsule (12.5 mg total) by mouth daily., Disp: 90 capsule, Rfl: 3   ibuprofen (ADVIL) 600 MG tablet, Take 1 tablet (600 mg total) by mouth every 6 (six) hours as needed (pain)., Disp: 40 tablet, Rfl: 0   naproxen (NAPROSYN) 500 MG tablet, TAKE 1 TABLET BY MOUTH TWICE A DAY WITH MEALS, Disp: 60 tablet, Rfl: 5   nortriptyline (PAMELOR) 25 MG capsule, Take 1 capsule (25 mg total) by mouth at bedtime., Disp: 90 capsule, Rfl: 3   Prenatal Vit-Fe Fumarate-FA (PNV PRENATAL PLUS MULTIVITAMIN) 27-1 MG TABS, Take 1 daily, Disp: 30 tablet, Rfl: 12   solifenacin (VESICARE) 5 MG tablet, TAKE 1 TABLET (5 MG TOTAL) BY MOUTH DAILY., Disp: 90 tablet, Rfl: 1  PAST MEDICAL  HISTORY: Past Medical History:  Diagnosis Date   Anxiety    Chlamydia 02/28/2020   Treated 02/28/20 POC________   Chronic back pain    Depression    Diabetes mellitus without complication (HCC)    Edema    Gestational diabetes    MS (multiple sclerosis) (HCC) 09/29/2014    PAST SURGICAL HISTORY: Past Surgical History:  Procedure Laterality Date   APPENDECTOMY     CHOLECYSTECTOMY N/A 10/07/2013   Procedure: LAPAROSCOPIC CHOLECYSTECTOMY;  Surgeon: Dalia Heading, MD;  Location: [facility];  Service: General;  Laterality: N/A;   LAPAROSCOPIC APPENDECTOMY N/A 05/12/2015   Procedure: APPENDECTOMY LAPAROSCOPIC;  Surgeon: Franky Macho, MD;  Location: [facility];  Service: General;  Laterality: N/A;   WISDOM TOOTH EXTRACTION      FAMILY HISTORY: Family History  Problem Relation Age of Onset   Diabetes Mother    Colon polyps Mother        hx of cancer   Other Mother        DDD Lumber, cervical   Sleep apnea Father     SOCIAL HISTORY:  Social History   Socioeconomic History   Marital status: Single    Spouse name: Not on file   Number of children: 1   Years of education: 2 y colleg   Highest education level: Not on file  Occupational History   Occupation: work  Tobacco Use   Smoking status: Former    Current packs/day: 0.00    Types: Cigarettes, E-cigarettes    Quit date: 09/16/2007    Years since quitting: 15.0   Smokeless tobacco: Never  Vaping Use   Vaping status: Former  Substance and Sexual Activity   Alcohol use: Not Currently   Drug use: Not Currently    Types: Marijuana    Comment: occ   Sexual activity: Yes    Birth control/protection: I.U.D.  Other Topics Concern   Not on file  Social History Narrative   Drinks 1-2 large glasses of caffeine daily.      Social Determinants of Health   Financial Resource Strain: Low Risk  (05/05/2021)   Overall Financial Resource Strain (CARDIA)    Difficulty of Paying Living Expenses: Not very hard  Food Insecurity: No Food  Insecurity (05/05/2021)  Hunger Vital Sign    Worried About Running Out of Food in the Last Year: Never true    Ran Out of Food in the Last Year: Never true  Transportation Needs: No Transportation Needs (05/05/2021)   PRAPARE - Administrator, Civil Service (Medical): No    Lack of Transportation (Non-Medical): No  Physical Activity: Insufficiently Active (05/05/2021)   Exercise Vital Sign    Days of Exercise per Week: 2 days    Minutes of Exercise per Session: 30 min  Stress: No Stress Concern Present (05/05/2021)   [employer] of Occupational Health - Occupational Stress Questionnaire    Feeling of Stress : Not at all  Social Connections: Moderately Integrated (05/05/2021)   Social Connection and Isolation Panel [NHANES]    Frequency of Communication with Friends and Family: More than three times a week    Frequency of Social Gatherings with Friends and Family: Three times a week    Attends Religious Services: More than 4 times per year    Active Member of Clubs or Organizations: Yes    Attends Banker Meetings: More than 4 times per year    Marital Status: Widowed  Intimate Partner Violence: Not At Risk (05/05/2021)   Humiliation, Afraid, Rape, and Kick questionnaire    Fear of Current or Ex-Partner: No    Emotionally Abused: No    Physically Abused: No    Sexually Abused: No     PHYSICAL EXAM  Vitals:   09/29/22 0812  BP: 103/66  Pulse: 71  Weight: (!) 347 lb (157.4 kg)  Height: 5\' 4"  (1.626 m)     Body mass index is 59.56 kg/m.   General: The patient is well-developed and well-nourished and in no acute distress   Skin: Extremities are without rash .   Mild edema   Neurologic Exam  Mental status: The patient is alert and oriented x 3 at the time of the examination. The patient has apparent normal recent and remote memory, with an apparently normal attention span and concentration ability.   Speech is normal.  Cranial nerves:  Extraocular movements are full.  Facial strength and sensation is normal.  Trapezius strength is normal.. No obvious hearing deficits are noted.  Motor:  Muscle bulk is normal.   Tone is mildly increased in the left leg.  Strength was 4+/5 in the left leg and 5/5 on the right.  Sensory: She has normal and symmetric sensation to touch in arms/legs.  Coordination: Cerebellar testing reveals good finger-nose-finger and heel-to-shin bilaterally.  Gait and station: Station is normal.  Mild left foot drop.  Very poor tandem gait requires supportr..    Romberg is negative..   Reflexes: Deep tendon reflexes are symmetric and normal in the arms and increased at the knees.[name]     DIAGNOSTIC DATA (LABS, IMAGING, TESTING) - I reviewed patient records, labs, notes, testing and imaging myself where available.  Lab Results  Component Value Date   WBC 3.4 10/20/2021   HGB 11.5 10/20/2021   HCT 32.7 (L) 10/20/2021   MCV 82 10/20/2021   PLT 251 10/20/2021      Component Value Date/Time   NA 140 10/20/2021 0947   K 4.4 10/20/2021 0947   CL 105 10/20/2021 0947   CO2 25 10/20/2021 0947   GLUCOSE 108 (H) 10/20/2021 0947   GLUCOSE 98 02/25/2018 1650   BUN 11 10/20/2021 0947   CREATININE 0.77 10/20/2021 0947   CALCIUM 8.8 10/20/2021 0947  PROT 6.9 10/20/2021 0947   ALBUMIN 3.7 (L) 10/20/2021 0947   AST 10 10/20/2021 0947   ALT 12 10/20/2021 0947   ALKPHOS 95 10/20/2021 0947   BILITOT <0.2 10/20/2021 0947   GFRNONAA 117 07/03/2019 1624   GFRAA 135 07/03/2019 1624   ____________________________________________  Assessment and plan:  High risk medication use - Plan: CBC with Differential/Platelet, Comprehensive metabolic panel  Multiple sclerosis (HCC) - Plan: MR BRAIN W WO CONTRAST, CBC with Differential/Platelet, Comprehensive metabolic panel  Vitamin D deficiency - Plan: VITAMIN D 25 Hydroxy (Vit-D Deficiency, Fractures)  Leg numbness  Chronic pain of left knee   1.    Continue  Tecfidera. Check CBC/Diff and CMP today    2.    Continue Vesicare for urge incontinence    HCTZ for edema.  Adderall for fatigue/MS cognitive changes 3.    Stay active and exercise as tolerated.   4.    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  Nortriptyline for worsening dysesthesia in left leg -- if not tolerated chane to lamotrigine.  Continue gabapoentin.    5.     Anxiety is worse and we wil add Buspar.  Return in 6 months she should call sooner if there are new or worsening neurologic symptoms.   42-minute office visit with the majority of the time spent face-to-face for history and physical, discussion/counseling and decision-making.  Additional time with record review and documentation.    A. Epimenio Foot, MD, PhD, FAAN Certified in Neurology, Clinical Neurophysiology, Sleep Medicine, Pain Medicine and Neuroimaging Director, [facility]  [facility] [address] [place], [place] [address] [phone]

## 2022-09-30 ENCOUNTER — Other Ambulatory Visit: Payer: Self-pay | Admitting: Neurology

## 2022-09-30 MED ORDER — VITAMIN D (ERGOCALCIFEROL) 1.25 MG (50000 UNIT) PO CAPS: 50000.0000 [IU] | ORAL_CAPSULE | ORAL | 3 refills | Status: DC

## 2022-10-03 ENCOUNTER — Telehealth: Payer: Self-pay | Admitting: Neurology

## 2022-10-03 NOTE — Telephone Encounter (Signed)
Brooke [name]: [id number] exp. 10/03/22-12/02/22 sent to GI [phone]

## 2022-10-14 ENCOUNTER — Emergency Department (HOSPITAL_COMMUNITY)
Admission: EM | Admit: 2022-10-14 | Discharge: 2022-10-14 | Disposition: A | Discharge status: Home / Self Care | Payer: Medicaid Other | Source: Home / Self Care | Attending: Emergency Medicine | Admitting: Emergency Medicine

## 2022-10-14 ENCOUNTER — Emergency Department (HOSPITAL_COMMUNITY): Payer: Medicaid Other

## 2022-10-14 ENCOUNTER — Other Ambulatory Visit: Payer: Self-pay

## 2022-10-14 ENCOUNTER — Encounter (HOSPITAL_COMMUNITY): Payer: Self-pay | Admitting: *Deleted

## 2022-10-14 DIAGNOSIS — Z87891 Personal history of nicotine dependence: Secondary | ICD-10-CM | POA: Insufficient documentation

## 2022-10-14 DIAGNOSIS — S86912A Strain of unspecified muscle(s) and tendon(s) at lower leg level, left leg, initial encounter: Secondary | ICD-10-CM

## 2022-10-14 DIAGNOSIS — R600 Localized edema: Secondary | ICD-10-CM | POA: Diagnosis not present

## 2022-10-14 DIAGNOSIS — S8392XA Sprain of unspecified site of left knee, initial encounter: Secondary | ICD-10-CM | POA: Diagnosis not present

## 2022-10-14 DIAGNOSIS — G35 Multiple sclerosis: Secondary | ICD-10-CM | POA: Insufficient documentation

## 2022-10-14 DIAGNOSIS — W19XXXA Unspecified fall, initial encounter: Secondary | ICD-10-CM | POA: Insufficient documentation

## 2022-10-14 DIAGNOSIS — M25562 Pain in left knee: Secondary | ICD-10-CM | POA: Diagnosis present

## 2022-10-14 DIAGNOSIS — Z91040 Latex allergy status: Secondary | ICD-10-CM | POA: Insufficient documentation

## 2022-10-14 HISTORY — DX: Essential (primary) hypertension: I10

## 2022-10-14 LAB — PREGNANCY, URINE: Preg Test, Ur: NEGATIVE

## 2022-10-14 MED ORDER — IBUPROFEN 600 MG PO TABS: 600.0000 mg | ORAL_TABLET | Freq: Four times a day (QID) | ORAL | 0 refills | Status: DC | PRN

## 2022-10-14 MED ORDER — CYCLOBENZAPRINE HCL 10 MG PO TABS: 10.0000 mg | ORAL_TABLET | Freq: Two times a day (BID) | ORAL | 0 refills | Status: DC | PRN

## 2022-10-14 MED ORDER — IBUPROFEN 800 MG PO TABS
800.0000 mg | ORAL_TABLET | Freq: Once | ORAL | Status: AC
  Administered 2022-10-14: 800 mg via ORAL
  Filled 2022-10-14: qty 1

## 2022-10-14 NOTE — ED Triage Notes (Signed)
Pt with left knee pain, not able to put full weight, due to fall about a month ago per pt..  pt states she landed on both knees, states pain to both knees but left is worse.  Denies hitting her head with fall.

## 2022-10-14 NOTE — Discharge Instructions (Signed)
You have been evaluated for your symptoms.  X-ray of your left knee did not show any concerning finding.  Fortunately ultrasound of your left leg did not show any evidence of blood clot.  Please take anti-inflammatory medication muscle relaxant as needed for your symptoms.  Use Ace wrap for support.  You may follow-up with orthopedic specialist as needed for your further care.

## 2022-10-14 NOTE — ED Provider Notes (Signed)
Brooke [name] EMERGENCY DEPARTMENT AT [facility] Provider Note   CSN: [id number] Arrival date & time: 10/14/22  1015     History  Chief Complaint  Patient presents with   Knee Injury    Brooke [name] is a 37 y.o. [name].  The history is provided by the patient and medical records. No language interpreter was used.     37 year old [name] was admitted history of multiple sclerosis, chronic left leg weakness, vitamin D deficiency presenting today with complaint of left knee pain.  Patient mention a month ago she was having left leg weakness due to her multiple sclerosis which caused her to fall several times.  States that she fell and strike her knees at least 4 times.  She has been having bilateral knees pain but left greater than right.  Pain initially was mild but for the past few days she noticed increased left knee pain.  Pain is sharp and throbbing worsening with movement and weightbearing.  For the past 2 days she also noticed increased swelling about her left leg with radiating pain.  She use a cane to walk.  She tries taking Tylenol without relief.  She does not endorse any fever or chills no chest pain or shortness of breath or hemoptysis.  No prior history of PE or DVT no recent surgery but she has been laying in bed more than usual due to her knee pain.  She has an IUD and not on oral birth control.  She denies any active numbness at this time.  No back pain.  No bowel bladder incontinence or saddle anesthesia.  Home Medications Prior to Admission medications   Medication Sig Start Date End Date Taking? Authorizing Provider  Vitamin D, Ergocalciferol, (DRISDOL) 1.25 MG (50000 UNIT) CAPS capsule Take 1 capsule (50,000 Units total) by mouth every 7 (seven) days. 09/30/22   Sater, Pearletha Furl, MD  acetaminophen (TYLENOL) 500 MG tablet Take 2 tablets (1,000 mg total) by mouth every 8 (eight) hours as needed (pain). 04/13/21   Worthy Rancher, MD   amphetamine-dextroamphetamine (ADDERALL XR) 30 MG 24 hr capsule Take 1 capsule (30 mg total) by mouth daily. 07/21/22   Sater, Pearletha Furl, MD  Blood Pressure Monitor MISC For regular home bp monitoring during pregnancy 10/14/20   Arabella Merles, CNM  busPIRone (BUSPAR) 15 MG tablet Take 1 tablet (15 mg total) by mouth 2 (two) times daily. 09/29/22   Sater, Pearletha Furl, MD  Dimethyl Fumarate 240 MG CPDR TAKE 1 CAPSULE TWICE A DAY 04/04/22   Sater, Pearletha Furl, MD  fluconazole (DIFLUCAN) 150 MG tablet Take 1 now and 1 in 3 days if needed 05/31/21   Adline Potter, NP  gabapentin (NEURONTIN) 600 MG tablet Take 1 tablet (600 mg total) by mouth 3 (three) times daily. 10/20/21   Sater, Pearletha Furl, MD  hydrochlorothiazide (MICROZIDE) 12.5 MG capsule Take 1 capsule (12.5 mg total) by mouth daily. 10/20/21   Sater, Pearletha Furl, MD  ibuprofen (ADVIL) 600 MG tablet Take 1 tablet (600 mg total) by mouth every 6 (six) hours as needed (pain). 04/13/21   Worthy Rancher, MD  naproxen (NAPROSYN) 500 MG tablet TAKE 1 TABLET BY MOUTH TWICE A DAY WITH MEALS 04/26/22   Darreld Mclean, MD  nortriptyline (PAMELOR) 25 MG capsule Take 1 capsule (25 mg total) by mouth at bedtime. 09/29/22   Sater, Pearletha Furl, MD  Prenatal Vit-Fe Fumarate-FA (PNV PRENATAL PLUS MULTIVITAMIN) 27-1 MG TABS Take 1 daily 08/19/20  Cyril Mourning A, NP  solifenacin (VESICARE) 5 MG tablet TAKE 1 TABLET (5 MG TOTAL) BY MOUTH DAILY. 10/11/21   Sater, Pearletha Furl, MD      Allergies    Lexapro [escitalopram oxalate], Tysabri [natalizumab], and Latex    Review of Systems   Review of Systems  All other systems reviewed and are negative.   Physical Exam Updated Vital Signs BP 118/63 (BP Location: Right Arm)   Pulse 64   Temp 98.6 F (37 C) (Oral)   Resp 16   Ht 5\' 4"  (1.626 m)   Wt (!) 157.4 kg   SpO2 99%   BMI 59.56 kg/m  Physical Exam Vitals and nursing note reviewed.  Constitutional:      General: She is not in acute distress.     Appearance: She is well-developed. She is obese.  HENT:     Head: Atraumatic.  Eyes:     Conjunctiva/sclera: Conjunctivae normal.  Pulmonary:     Effort: Pulmonary effort is normal.  Musculoskeletal:        General: Tenderness (Left knee: Tenderness to palpation about the knee with decreased range of motion but no deformity no edema erythema or warmth.  Patella is located.) present.     Cervical back: Neck supple.     Right lower leg: No edema.     Left lower leg: Edema (Trace edema noted to left lower extremity compared to right) present.  Skin:    Capillary Refill: Capillary refill takes less than 2 seconds.     Findings: No rash.  Neurological:     Mental Status: She is alert.  Psychiatric:        Mood and Affect: Mood normal.     ED Results / Procedures / Treatments   Labs (all labs ordered are listed, but only abnormal results are displayed) Labs Reviewed  PREGNANCY, URINE    EKG None  Radiology DG Knee Complete 4 Views Left  Result Date: 10/14/2022 CLINICAL DATA:  Pain after fall 1 month ago. EXAM: LEFT KNEE - COMPLETE 5 VIEW COMPARISON:  None Available. FINDINGS: No evidence of fracture, dislocation, or joint effusion. No evidence of arthropathy or other focal bone abnormality. Soft tissues are unremarkable. Limited lateral view due to rotation. IMPRESSION: Grossly no acute osseous abnormality. There is limited lateral view due to rotation. No obvious effusion. Repeat lateral view as clinically appropriate Electronically Signed   By: Karen Kays M.D.   On: 10/14/2022 13:55   US Venous Img Lower  Left (DVT Study)  Result Date: 10/14/2022 CLINICAL DATA:  Left lower extremity pain and edema. Prior fall. Former smoker. Evaluate for DVT. EXAM: LEFT LOWER EXTREMITY VENOUS DOPPLER ULTRASOUND TECHNIQUE: Gray-scale sonography with graded compression, as well as color Doppler and duplex ultrasound were performed to evaluate the lower extremity deep venous systems from the level  of the common femoral vein and including the common femoral, femoral, profunda femoral, popliteal and calf veins including the posterior tibial, peroneal and gastrocnemius veins when visible. The superficial great saphenous vein was also interrogated. Spectral Doppler was utilized to evaluate flow at rest and with distal augmentation maneuvers in the common femoral, femoral and popliteal veins. COMPARISON:  None Available. FINDINGS: Contralateral Common Femoral Vein: Respiratory phasicity is normal and symmetric with the symptomatic side. No evidence of thrombus. Normal compressibility. Common Femoral Vein: No evidence of thrombus. Normal compressibility, respiratory phasicity and response to augmentation. Saphenofemoral Junction: No evidence of thrombus. Normal compressibility and flow on color Doppler imaging. Profunda  Femoral Vein: No evidence of thrombus. Normal compressibility and flow on color Doppler imaging. Femoral Vein: No evidence of thrombus. Normal compressibility, respiratory phasicity and response to augmentation. Popliteal Vein: No evidence of thrombus. Normal compressibility, respiratory phasicity and response to augmentation. Calf Veins: No evidence of thrombus. Normal compressibility and flow on color Doppler imaging. Superficial Great Saphenous Vein: No evidence of thrombus. Normal compressibility. Other Findings:  None. IMPRESSION: No evidence of DVT within the left lower extremity. Electronically Signed   By: Simonne Come M.D.   On: 10/14/2022 13:34    Procedures Procedures    Medications Ordered in ED Medications  ibuprofen (ADVIL) tablet 800 mg (800 mg Oral Given 10/14/22 1301)    ED Course/ Medical Decision Making/ A&P                                 Medical Decision Making Amount and/or Complexity of Data Reviewed Labs: ordered. Radiology: ordered.  Risk Prescription drug management.   BP 118/63 (BP Location: Right Arm)   Pulse 64   Temp 98.6 F (37 C) (Oral)    Resp 16   Ht 5\' 4"  (1.626 m)   Wt (!) 157.4 kg   SpO2 99%   BMI 59.56 kg/m   Brooke PM  37 year old [name] was admitted history of multiple sclerosis, chronic left leg weakness, vitamin D deficiency presenting today with complaint of left knee pain.  Patient mention a month ago she was having left leg weakness due to her multiple sclerosis which caused her to fall several times.  States that she fell and strike her knees at least 4 times.  She has been having bilateral knees pain but left greater than right.  Pain initially was mild but for the past few days she noticed increased left knee pain.  Pain is sharp and throbbing worsening with movement and weightbearing.  For the past 2 days she also noticed increased swelling about her left leg with radiating pain.  She use a cane to walk.  She tries taking Tylenol without relief.  She does not endorse any fever or chills no chest pain or shortness of breath or hemoptysis.  No prior history of PE or DVT no recent surgery but she has been laying in bed more than usual due to her knee pain.  She has an IUD and not on oral birth control.  She denies any active numbness at this time.  No back pain.  No bowel bladder incontinence or saddle anesthesia.  On exam morbidly obese [name] resting comfortably in bed appears to be in no acute discomfort.  On palpation of her left knee she does have tenderness throughout without any signs of infection no deformity no crepitus and no limited range of motion.  She does have trace edema to her left lower extremity compared to right but difficult to fully assess due to large body habitus.  Her leg compartment is soft.  She has intact distal pulses.  Due to the leg swelling, will obtain venous Doppler study to rule out DVT.  X-ray of the left knee have been ordered.  Patient given ibuprofen for pain.  2:09 PM -Labs ordered, independently viewed and interpreted by me.  Labs remarkable for negative preg test -The patient was  maintained on a cardiac monitor.  I personally viewed and interpreted the cardiac monitored which showed an underlying rhythm of: NSR -Imaging independently viewed and interpreted by me and  I agree with radiologist's interpretation.  Result remarkable for doppler US or LLE showing no evidence of DVT -This patient presents to the ED for concern of leg pain, this involves an extensive number of treatment options, and is a complaint that carries with it a high risk of complications and morbidity.  The differential diagnosis includes strain, sprain, fx, dislocation, dvt, cellulitis, compartment syndrome, bursitis -Co morbidities that complicate the patient evaluation includes obesity, MS -Treatment includes ace wrap and ibuprofen -Reevaluation of the patient after these medicines showed that the patient improved -PCP office notes or outside notes reviewed -Escalation to admission/observation considered: patients feels much better, is comfortable with discharge, and will follow up with PCP -Prescription medication considered, patient comfortable with NSAIDs and muscle relaxant -Social Determinant of Health considered which includes lack of mobility and tobacco use.   Suspect strain/sprain causing pain.  Doubt infectious cause.         Final Clinical Impression(s) / ED Diagnoses Final diagnoses:  Knee strain, left, initial encounter    Rx / DC Orders ED Discharge Orders          Ordered    ibuprofen (ADVIL) 600 MG tablet  Every 6 hours PRN        10/14/22 1416    cyclobenzaprine (FLEXERIL) 10 MG tablet  2 times daily PRN        10/14/22 1416              Fayrene Helper, PA-C 10/14/22 1418    Terrilee Files, MD 10/14/22 1816

## 2022-10-23 ENCOUNTER — Encounter: Payer: Self-pay | Admitting: Neurology

## 2022-10-25 ENCOUNTER — Other Ambulatory Visit: Payer: Self-pay | Admitting: Neurology

## 2022-10-25 NOTE — Telephone Encounter (Signed)
Last seen on 09/29/22 Follow up scheduled on 04/17/22  Rx was sent on 09/29/22 for 30 day supply, pharmacy is requesting a 90 day supply. Rx sent.

## 2022-11-10 ENCOUNTER — Encounter: Payer: Self-pay | Admitting: Neurology

## 2022-11-14 ENCOUNTER — Other Ambulatory Visit: Payer: Medicaid Other

## 2022-11-29 ENCOUNTER — Other Ambulatory Visit: Payer: Self-pay | Admitting: Neurology

## 2022-11-29 NOTE — Telephone Encounter (Signed)
Last seen on 09/29/22 Follow up scheduled on 04/17/22

## 2022-12-14 NOTE — Telephone Encounter (Signed)
This was approved and scheduled within the approved dates at [facility] and they must have not realized it was already approved. They submitted another PA and it was denied since there was already one on file, but GI only saw the denial and they cancelled the patient's appointment. The patient left me a voice mail today asking about getting it approved and rescheduled. I submitted a new PA to [facility] and it is pending faxed notes. Case [id number]

## 2022-12-15 NOTE — Telephone Encounter (Signed)
[facility] Berkley Harvey: [id number] exp. 12/14/22-02/12/23 sent to GI [phone]

## 2023-01-02 ENCOUNTER — Encounter: Payer: Self-pay | Admitting: Neurology

## 2023-01-03 ENCOUNTER — Ambulatory Visit: Payer: Medicaid Other | Admitting: Neurology

## 2023-01-03 ENCOUNTER — Encounter: Payer: Self-pay | Admitting: Neurology

## 2023-01-03 VITALS — BP 124/64 | HR 91 | Ht 65.0 in | Wt 345.5 lb

## 2023-01-03 DIAGNOSIS — F988 Other specified behavioral and emotional disorders with onset usually occurring in childhood and adolescence: Secondary | ICD-10-CM | POA: Diagnosis not present

## 2023-01-03 DIAGNOSIS — G5602 Carpal tunnel syndrome, left upper limb: Secondary | ICD-10-CM

## 2023-01-03 DIAGNOSIS — R3915 Urgency of urination: Secondary | ICD-10-CM

## 2023-01-03 DIAGNOSIS — G35 Multiple sclerosis: Secondary | ICD-10-CM

## 2023-01-03 DIAGNOSIS — Z79899 Other long term (current) drug therapy: Secondary | ICD-10-CM | POA: Diagnosis not present

## 2023-01-03 DIAGNOSIS — Z114 Encounter for screening for human immunodeficiency virus [HIV]: Secondary | ICD-10-CM | POA: Diagnosis not present

## 2023-01-03 DIAGNOSIS — R200 Anesthesia of skin: Secondary | ICD-10-CM

## 2023-01-03 DIAGNOSIS — R269 Unspecified abnormalities of gait and mobility: Secondary | ICD-10-CM

## 2023-01-03 DIAGNOSIS — R5383 Other fatigue: Secondary | ICD-10-CM

## 2023-01-03 MED ORDER — SOLIFENACIN SUCCINATE 10 MG PO TABS: 10.0000 mg | ORAL_TABLET | Freq: Every day | ORAL | 3 refills | Status: DC

## 2023-01-03 NOTE — Progress Notes (Signed)
[facility]  PATIENT: Brooke [name] DOB: Aug 09, 1985  REFERRING DOCTOR OR PCP:  Dr. Vickey Huger; PCP is Fleet Contras SOURCE: Patient, notes from Dr. Vickey Huger, imaging and laboratory reports, MRI images on PACS.  _________________________________   HISTORICAL  CHIEF COMPLAINT:  Chief Complaint  Patient presents with   Follow-up    Pt in room 10 alone.Here for MS follow up. On Tecfidera. Patient wants to discuss infusion verses taking pill, pt states she forgets to take her medication. Pt said she feels a pressure feeling in her eyes just started a 1 week ago. No recent eye exam. Pt said she gets a sharp pain in her head at times.     HISTORY OF PRESENT ILLNESS:  Brooke [name] is a 36 y.o.  woman with multiple sclerosis.      Update 01/03/2023: She is on DMF (restarted 03/2021 after her daughter was born.    She is tolerating it well but has compliance issues and often misses a dose.    No exacerbation though she notes left hand pain is worse.   She also has had a sharp pain shooting in her head.     Gait is off due to reduced balance   The  left leg was weaker than right - but she feels about the same now.  Ampyra had no benefit.   She has some tingling in her fingertips.   Arms strong.     She is numb in the leg with dysesthesias  - needles and pins or burning.   She fell a few times on the left knee and now has a patch of numbness below inner knee associated with burning painful dysesthesias.  She has left hand > right numbness.    She takes gabapentin 300 mg po tid and tolerates it well.     She is experiencing frontal headaches, also left occipital pain.  Sometime shas a shooting pain in her head.   NSAIDs helps a  little bit.     She has urinary urge incontinence that did better with solifenacin - works better than oxybutynin.      She has eye pain but no change in vision.    She also has DM.    She has some fatigue.  She is sleeping poorly waking up  for the bay every few hours.  She falls back asleep ok as very tired.    She denies depression but has some anxiety.  She takes xanax prn.    Cognition is doing ok.     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  We discussed trying to lose weight would help DM and MS.    MS History She was diagnosed with MS in 2016 after presenting with visual disturbance, diplopia and poor gait.    She was initially placed on Tecfidera.   She did not note any benefit desired to switch medications and started Ocrevus.  She did not feel good on that medication.  She switched to see me in 2019.  We switched to Tysabri.  Unfortunately, she had allergic reaction after her second infusion and went back on Tecfidera.  She was off for several months while pregnant but went back on after miscarriage and December 2020, then off for pregnancy in 2022 and back on 03/2021  Imaging MRI of the brain 05/13/2016 showed multiple T2/FLAIR hyperintense foci in the pons and hemispheres in a pattern and configuration consistent with chronic  demyelinating plaque associated with multiple sclerosis. None of the foci appears to be acute. When compared to the MRI dated 01/11/2015, there is no interval change.    There is a normal enhancement pattern and there are no acute findings.  MRI of the cervical spine 05/13/2016 Foci within the posterior spinal cord adjacent to C3 and the left spinal cord adjacent to C5.   Neither of these appear to be acute.      Minimal disc degenerative changes at C5-C6 and C6-C7 that did not lead to any nerve root impingement.    There is a normal enhancement pattern and there are no acute findings.  MRI of the thoracic spine 05/16/2016 showed a focus within the left anterior spinal cord adjacent to T8-T9 consistent with a demyelinating plaque associated with multiple sclerosis. It appears to be chronic and does not enhance. There is a small focus to the right adjacent to T3-T4.   It  is noted on the axial images but not the sagittal images and could represent a small chronic demyelinating plaque or be artifactual. There is small right lateral disc protrusion at T11-T12. There is no nerve root compression..   There is a normal enhancement pattern.  MRI of the cervical spine 08/07/2019 showed a T2 hyperintense foci within the spinal cord adjacent to C2, C3 and C5.  None of the foci appear to be acute.  They do not enhance.  They were present on the MRI from 05/12/2016.   Mild disc degenerative changes at C5-C6 and C6-C7 that do not lead to spinal stenosis or nerve root compression.   MRI of the brain 08/09/2019 showed multiple T2/FLAIR hyperintense foci in the pons and hemispheres in a pattern configuration consistent with chronic demyelinating plaque associated with multiple sclerosis.  None of the foci appear to be acute and they do not enhance.  Compared to the MRI dated 05/12/2016, there are no definite new lesions.  No acute findings and a normal enhancement pattern.  MRI brain 08/07/2019 shows multiple T2/FLAIR hyperintense foci in the pons and hemispheres in a pattern configuration consistent with chronic demyelinating plaque associated with multiple sclerosis.  None of the foci appear to be acute and they do not enhance.  Compared to the MRI dated 05/12/2016, there are no definite new lesions  MRI cervical spine 08/09/2019  T2 hyperintense foci within the spinal cord adjacent to C2, C3 and C5.  None of the foci appear to be acute.  They do not enhance.  They were present on the MRI from 05/12/2016. 2.   Mild disc degenerative changes at C5-C6 and C6-C7 that do not lead to spinal stenosis or nerve root compression.  REVIEW OF SYSTEMS: Constitutional: No fevers, chills, sweats, or change in appetite.  She notes fatigue. Eyes: No visual changes, double vision, eye pain Ear, nose and throat: No hearing loss, ear pain, nasal congestion, sore throat Cardiovascular: No chest pain,  palpitations Respiratory:  No shortness of breath at rest or with exertion.   No wheezes GastrointestinaI: No nausea, vomiting, diarrhea, abdominal pain, fecal incontinence Genitourinary:  No dysuria, urinary retention or frequency.  No nocturia. Musculoskeletal:  No neck pain, back pain Integumentary: No rash, pruritus, skin lesions Neurological: as above Psychiatric: She denies depression or anxiety.   Endocrine: No palpitations, diaphoresis, change in appetite, change in weigh or increased thirst Hematologic/Lymphatic:  No anemia, purpura, petechiae. Allergic/Immunologic: No itchy/runny eyes, nasal congestion, recent allergic reactions, rashes  ALLERGIES: Allergies  Allergen Reactions   Lexapro [Escitalopram  Oxalate] Other (See Comments)    Suicidal thoughts, lowered libido   Tysabri [Natalizumab] Hives    Hives, itching, elevated HR and BP.   Latex Hives    HOME MEDICATIONS:  Current Outpatient Medications:    acetaminophen (TYLENOL) 500 MG tablet, Take 2 tablets (1,000 mg total) by mouth every 8 (eight) hours as needed (pain)., Disp: 60 tablet, Rfl: 0   amphetamine-dextroamphetamine (ADDERALL XR) 30 MG 24 hr capsule, Take 1 capsule (30 mg total) by mouth daily., Disp: 30 capsule, Rfl: 0   Blood Pressure Monitor MISC, For regular home bp monitoring during pregnancy, Disp: 1 each, Rfl: 0   busPIRone (BUSPAR) 15 MG tablet, TAKE 1 TABLET BY MOUTH 2 TIMES DAILY., Disp: 180 tablet, Rfl: 2   cyclobenzaprine (FLEXERIL) 10 MG tablet, Take 1 tablet (10 mg total) by mouth 2 (two) times daily as needed for muscle spasms., Disp: 20 tablet, Rfl: 0   Dimethyl Fumarate 240 MG CPDR, TAKE 1 CAPSULE TWICE A DAY, Disp: 60 capsule, Rfl: 0   fluconazole (DIFLUCAN) 150 MG tablet, Take 1 now and 1 in 3 days if needed, Disp: 2 tablet, Rfl: 1   gabapentin (NEURONTIN) 600 MG tablet, TAKE 1 TABLET BY MOUTH THREE TIMES A DAY, Disp: 90 tablet, Rfl: 3   hydrochlorothiazide (MICROZIDE) 12.5 MG capsule, Take 1  capsule (12.5 mg total) by mouth daily., Disp: 90 capsule, Rfl: 3   ibuprofen (ADVIL) 600 MG tablet, Take 1 tablet (600 mg total) by mouth every 6 (six) hours as needed., Disp: 30 tablet, Rfl: 0   naproxen (NAPROSYN) 500 MG tablet, TAKE 1 TABLET BY MOUTH TWICE A DAY WITH MEALS, Disp: 60 tablet, Rfl: 5   nortriptyline (PAMELOR) 25 MG capsule, Take 1 capsule (25 mg total) by mouth at bedtime., Disp: 90 capsule, Rfl: 3   Prenatal Vit-Fe Fumarate-FA (PNV PRENATAL PLUS MULTIVITAMIN) 27-1 MG TABS, Take 1 daily, Disp: 30 tablet, Rfl: 12   Vitamin D, Ergocalciferol, (DRISDOL) 1.25 MG (50000 UNIT) CAPS capsule, Take 1 capsule (50,000 Units total) by mouth every 7 (seven) days., Disp: 13 capsule, Rfl: 3   solifenacin (VESICARE) 10 MG tablet, Take 1 tablet (10 mg total) by mouth daily., Disp: 90 tablet, Rfl: 3  PAST MEDICAL HISTORY: Past Medical History:  Diagnosis Date   Anxiety    Chlamydia 02/28/2020   Treated 02/28/20 POC________   Chronic back pain    Depression    Diabetes mellitus without complication (HCC)    Edema    Gestational diabetes    Hypertension    MS (multiple sclerosis) (HCC) 09/29/2014    PAST SURGICAL HISTORY: Past Surgical History:  Procedure Laterality Date   APPENDECTOMY     CHOLECYSTECTOMY N/A 10/07/2013   Procedure: LAPAROSCOPIC CHOLECYSTECTOMY;  Surgeon: Dalia Heading, MD;  Location: [facility];  Service: General;  Laterality: N/A;   LAPAROSCOPIC APPENDECTOMY N/A 05/12/2015   Procedure: APPENDECTOMY LAPAROSCOPIC;  Surgeon: Franky Macho, MD;  Location: [facility];  Service: General;  Laterality: N/A;   WISDOM TOOTH EXTRACTION      FAMILY HISTORY: Family History  Problem Relation Age of Onset   Diabetes Mother    Colon polyps Mother        hx of cancer   Other Mother        DDD Lumber, cervical   Sleep apnea Father     SOCIAL HISTORY:  Social History   Socioeconomic History   Marital status: Single    Spouse name: Not on file   Number of  children: 1   Years of  education: 2 y colleg   Highest education level: Not on file  Occupational History   Occupation: work  Tobacco Use   Smoking status: Former    Current packs/day: 0.00    Types: Cigarettes, E-cigarettes    Quit date: 09/16/2007    Years since quitting: 15.3   Smokeless tobacco: Never  Vaping Use   Vaping status: Former  Substance and Sexual Activity   Alcohol use: Not Currently   Drug use: Not Currently    Types: Marijuana    Comment: occ   Sexual activity: Yes    Birth control/protection: I.U.D.  Other Topics Concern   Not on file  Social History Narrative   Drinks 1-2 large glasses of caffeine daily.      Social Determinants of Health   Financial Resource Strain: Low Risk  (05/05/2021)   Overall Financial Resource Strain (CARDIA)    Difficulty of Paying Living Expenses: Not very hard  Food Insecurity: No Food Insecurity (05/05/2021)   Hunger Vital Sign    Worried About Running Out of Food in the Last Year: Never true    Ran Out of Food in the Last Year: Never true  Transportation Needs: No Transportation Needs (05/05/2021)   PRAPARE - Administrator, Civil Service (Medical): No    Lack of Transportation (Non-Medical): No  Physical Activity: Insufficiently Active (05/05/2021)   Exercise Vital Sign    Days of Exercise per Week: 2 days    Minutes of Exercise per Session: 30 min  Stress: No Stress Concern Present (05/05/2021)   [employer] of Occupational Health - Occupational Stress Questionnaire    Feeling of Stress : Not at all  Social Connections: Moderately Integrated (05/05/2021)   Social Connection and Isolation Panel [NHANES]    Frequency of Communication with Friends and Family: More than three times a week    Frequency of Social Gatherings with Friends and Family: Three times a week    Attends Religious Services: More than 4 times per year    Active Member of Clubs or Organizations: Yes    Attends Banker Meetings: More than 4 times  per year    Marital Status: Widowed  Intimate Partner Violence: Not At Risk (05/05/2021)   Humiliation, Afraid, Rape, and Kick questionnaire    Fear of Current or Ex-Partner: No    Emotionally Abused: No    Physically Abused: No    Sexually Abused: No     PHYSICAL EXAM  Vitals:   01/03/23 1526  BP: 124/64  Pulse: 91  Weight: (!) 345 lb 8 oz (156.7 kg)  Height: 5\' 5"  (1.651 m)     Body mass index is 57.49 kg/m.   General: The patient is well-developed and well-nourished and in no acute distress   Skin: Extremities are without rash .   Mild ankle edema   Neurologic Exam  Mental status: The patient is alert and oriented x 3 at the time of the examination. The patient has apparent normal recent and remote memory, with an apparently normal attention span and concentration ability.   Speech is normal.  Cranial nerves: Extraocular movements are full.  Facial strength and sensation is normal.  Trapezius strength is normal.. No obvious hearing deficits are noted.  Motor:  Muscle bulk is normal.   Tone is mildly increased in the left leg.  Strength was 4+/5 in the left leg and 5/5 on the right.  Sensory: She has normal  and symmetric sensation to touch in arms/legs.  Coordination: Cerebellar testing reveals good finger-nose-finger and heel-to-shin bilaterally.  Gait and station: Station is normal.  Mild left foot drop.  Stable turn.  Very poor tandem gait requires supportr..    Romberg is negative..   Reflexes: Deep tendon reflexes are symmetric and normal in the arms and increased at the knees.[name]     DIAGNOSTIC DATA (LABS, IMAGING, TESTING) - I reviewed patient records, labs, notes, testing and imaging myself where available.  Lab Results  Component Value Date   WBC 4.4 09/29/2022   HGB 13.2 09/29/2022   HCT 38.8 09/29/2022   MCV 86 09/29/2022   PLT 258 09/29/2022      Component Value Date/Time   NA 137 09/29/2022 0906   K 4.8 09/29/2022 0906   CL 101 09/29/2022  0906   CO2 25 09/29/2022 0906   GLUCOSE 108 (H) 09/29/2022 0906   GLUCOSE 98 02/25/2018 1650   BUN 8 09/29/2022 0906   CREATININE 0.79 09/29/2022 0906   CALCIUM 8.9 09/29/2022 0906   PROT 7.2 09/29/2022 0906   ALBUMIN 3.9 09/29/2022 0906   AST 10 09/29/2022 0906   ALT 12 09/29/2022 0906   ALKPHOS 79 09/29/2022 0906   BILITOT 0.5 09/29/2022 0906   GFRNONAA 117 07/03/2019 1624   GFRAA 135 07/03/2019 1624   ____________________________________________  Assessment and plan:  Multiple sclerosis (HCC) - Plan: CBC with Differential/Platelet, Hepatitis B surface antigen, HIV Antibody (routine testing w rflx), Hepatitis B surface antibody,qualitative, Hepatitis B core antibody, total, QuantiFERON-TB Gold Plus, Comprehensive metabolic panel, CBC with Differential/Platelet, Varicella zoster antibody, IgG  High risk medication use - Plan: CBC with Differential/Platelet, Hepatitis B surface antigen, HIV Antibody (routine testing w rflx), Hepatitis B surface antibody,qualitative, Hepatitis B core antibody, total, QuantiFERON-TB Gold Plus, Comprehensive metabolic panel, CBC with Differential/Platelet, Varicella zoster antibody, IgG  Encounter for screening for HIV - Plan: HIV Antibody (routine testing w rflx)  Attention deficit disorder, unspecified type  Leg numbness  Gait disturbance  Other fatigue  Urinary urgency   1.    Change to Kesimpta from DMF. Check ID labs, CBC/Diff and CMP today    2.    Continue Vesicare for urge incontinence  Continue Adderall for ADD/fatigue/MS cognitive changes 3.    Stay active and exercise as tolerated.   4.    If hand worsens can check NCV/EMG for left CTS.   Advised to get wrist splint for the presumed CTS 5.    Return in 6 months she should call sooner if there are new or worsening neurologic symptoms.     Tykeem Lanzer A. Epimenio Foot, MD, PhD, FAAN Certified in Neurology, Clinical Neurophysiology, Sleep Medicine, Pain Medicine and Neuroimaging Director,  [facility]  [facility] [address], [address] [place], [place] [address] [phone]

## 2023-01-04 ENCOUNTER — Other Ambulatory Visit: Payer: Self-pay | Admitting: Neurology

## 2023-01-04 MED ORDER — AMPHETAMINE-DEXTROAMPHET ER 30 MG PO CP24: 30.0000 mg | ORAL_CAPSULE | Freq: Every day | ORAL | 0 refills | Status: AC

## 2023-01-04 NOTE — Telephone Encounter (Signed)
Last seen on 01/03/23 Follow up scheduled on 07/24/23 Last filled on 07/21/22 #30 tablets  Rx pending to be signed

## 2023-01-04 NOTE — Telephone Encounter (Signed)
Pt called stating that her [facility] informed her that the provider is needing to send in a refill request for her amphetamine-dextroamphetamine (ADDERALL XR) 30 MG 24 hr capsule and some other information but she was not sure exactly what it was. Pt did not remember.

## 2023-01-06 LAB — QUANTIFERON-TB GOLD PLUS
QuantiFERON Mitogen Value: >10 [IU]/mL
QuantiFERON Nil Value: 0 [IU]/mL
QuantiFERON TB1 Ag Value: 0 [IU]/mL
QuantiFERON TB2 Ag Value: 0 [IU]/mL
QuantiFERON-TB Gold Plus: NEGATIVE

## 2023-01-06 LAB — COMPREHENSIVE METABOLIC PANEL
ALT: 10 [IU]/L (ref 0–32)
AST: 11 [IU]/L (ref 0–40)
Albumin: 3.9 g/dL (ref 3.9–4.9)
Alkaline Phosphatase: 84 [IU]/L (ref 44–121)
BUN/Creatinine Ratio: 6 — ABNORMAL LOW (ref 9–23)
BUN: 5 mg/dL — ABNORMAL LOW (ref 6–20)
Bilirubin Total: 0.4 mg/dL (ref 0.0–1.2)
CO2: 26 mmol/L (ref 20–29)
Calcium: 9.4 mg/dL (ref 8.7–10.2)
Chloride: 102 mmol/L (ref 96–106)
Creatinine, Ser: 0.83 mg/dL (ref 0.57–1.00)
Globulin, Total: 3.6 g/dL (ref 1.5–4.5)
Glucose: 87 mg/dL (ref 70–99)
Potassium: 4 mmol/L (ref 3.5–5.2)
Sodium: 140 mmol/L (ref 134–144)
Total Protein: 7.5 g/dL (ref 6.0–8.5)
eGFR: 94 mL/min/{1.73_m2} (ref >59)

## 2023-01-06 LAB — HEPATITIS B CORE ANTIBODY, TOTAL: Hep B Core Total Ab: NEGATIVE

## 2023-01-06 LAB — HIV ANTIBODY (ROUTINE TESTING W REFLEX): HIV Screen 4th Generation wRfx: NONREACTIVE

## 2023-01-06 LAB — CBC WITH DIFFERENTIAL/PLATELET
Basophils Absolute: 0 10*3/uL (ref 0.0–0.2)
Basos: 0 %
EOS (ABSOLUTE): 0 10*3/uL (ref 0.0–0.4)
Eos: 1 %
Hematocrit: 38 % (ref 34.0–46.6)
Hemoglobin: 13.4 g/dL (ref 11.1–15.9)
Immature Grans (Abs): 0 10*3/uL (ref 0.0–0.1)
Immature Granulocytes: 0 %
Lymphocytes Absolute: 1.9 10*3/uL (ref 0.7–3.1)
Lymphs: 38 %
MCH: 30.3 pg (ref 26.6–33.0)
MCHC: 35.3 g/dL (ref 31.5–35.7)
MCV: 86 fL (ref 79–97)
Monocytes Absolute: 0.2 10*3/uL (ref 0.1–0.9)
Monocytes: 5 %
Neutrophils Absolute: 2.8 10*3/uL (ref 1.4–7.0)
Neutrophils: 56 %
Platelets: 276 10*3/uL (ref 150–450)
RBC: 4.42 x10E6/uL (ref 3.77–5.28)
RDW: 13 % (ref 11.7–15.4)
WBC: 5.1 10*3/uL (ref 3.4–10.8)

## 2023-01-06 LAB — VARICELLA ZOSTER ANTIBODY, IGG: Varicella zoster IgG: REACTIVE

## 2023-01-06 LAB — HEPATITIS B SURFACE ANTIBODY,QUALITATIVE: Hep B Surface Ab, Qual: NONREACTIVE

## 2023-01-06 LAB — HEPATITIS B SURFACE ANTIGEN: Hepatitis B Surface Ag: NEGATIVE

## 2023-01-17 ENCOUNTER — Telehealth: Payer: Self-pay

## 2023-01-17 NOTE — Telephone Encounter (Signed)
Faxed start form to [facility] at [phone]. Received fax confirmation.

## 2023-01-26 NOTE — Telephone Encounter (Signed)
Called pt. Relayed mychart message. She confirmed she has heard fro [facility]. Starter dose scheduled to be shipped to her on 02/02/23. [facility]. She will call if she has any trouble with injection/questions moving forward.

## 2023-01-31 ENCOUNTER — Telehealth: Payer: Self-pay | Admitting: *Deleted

## 2023-01-31 ENCOUNTER — Telehealth: Payer: Self-pay | Admitting: Neurology

## 2023-01-31 NOTE — Telephone Encounter (Signed)
Per Dr.Sater " Tysabri and Althia Forts are different and  she is unlikely to have a cross-reactivity "

## 2023-01-31 NOTE — Telephone Encounter (Signed)
[facility] calling to get clarification on a medication. Transferred to POD 4.

## 2023-01-31 NOTE — Telephone Encounter (Signed)
Received a call from [facility] at [facility] called stating it showed patient had reaction to Tysabri. I informed pharmacist that you aware and it has been documented in chart on 01/03/23 visit.    Thayer Ohm said that it showing a drug interaction with the Kesmipta similar to tysabri. He wanted to make sure you are aware of this and wanted to proceed. I explained that we follow the patient and if any adverse reaction the patient is aware reach out.   This is just FYI.

## 2023-02-01 ENCOUNTER — Other Ambulatory Visit: Payer: Medicaid Other

## 2023-02-03 ENCOUNTER — Ambulatory Visit
Admission: RE | Admit: 2023-02-03 | Discharge: 2023-02-03 | Disposition: A | Discharge status: Home / Self Care | Payer: Medicaid Other | Source: Ambulatory Visit | Attending: Neurology | Admitting: Neurology

## 2023-02-03 DIAGNOSIS — G35 Multiple sclerosis: Secondary | ICD-10-CM | POA: Diagnosis not present

## 2023-02-03 MED ORDER — GADOPICLENOL 0.5 MMOL/ML IV SOLN
10.0000 mL | Freq: Once | INTRAVENOUS | Status: AC | PRN
  Administered 2023-02-03: 10 mL via INTRAVENOUS

## 2023-02-28 NOTE — Telephone Encounter (Signed)
 I called patient.  She reports that she is tolerating Kesimpta well.  She will let us know if she needs [place] before her appointment in June.

## 2023-02-28 NOTE — Addendum Note (Signed)
 Addended by: Geronimo Running A on: 02/28/2023 02:15 PM   Modules accepted: Orders

## 2023-03-01 NOTE — Telephone Encounter (Signed)
 I called patient.  She has completed her Kesimpta  starter pack (3 doses).  She is waiting on her once monthly injection to arrive.  T was supposed to arrive earlier this week but has been delayed.  She expects it to arrive tomorrow.  I advised her it was okay to take the dose when it was received.  I reminded her that the doses for Kesimpta  are once monthly.  Patient verbalized understanding and appreciation.

## 2023-03-01 NOTE — Telephone Encounter (Signed)
 Pt called stating that her Kesimpta has been delayed due to the weather and she was to start it today. Pt wants to know is it ok to start when she gets it or is that going to throw everything off. Please advise.

## 2023-03-14 ENCOUNTER — Ambulatory Visit: Payer: Medicaid Other | Admitting: Neurology

## 2023-03-20 ENCOUNTER — Encounter: Payer: Self-pay | Admitting: Neurology

## 2023-04-18 ENCOUNTER — Ambulatory Visit: Payer: Medicaid Other | Admitting: Family Medicine

## 2023-04-19 ENCOUNTER — Encounter: Payer: Self-pay | Admitting: Neurology

## 2023-05-30 ENCOUNTER — Encounter: Payer: Self-pay | Admitting: Neurology

## 2023-05-30 ENCOUNTER — Other Ambulatory Visit: Payer: Self-pay | Admitting: *Deleted

## 2023-05-30 DIAGNOSIS — R29898 Other symptoms and signs involving the musculoskeletal system: Secondary | ICD-10-CM

## 2023-05-30 DIAGNOSIS — R5383 Other fatigue: Secondary | ICD-10-CM

## 2023-05-30 DIAGNOSIS — G35 Multiple sclerosis: Secondary | ICD-10-CM

## 2023-05-30 DIAGNOSIS — G8929 Other chronic pain: Secondary | ICD-10-CM

## 2023-05-30 DIAGNOSIS — R269 Unspecified abnormalities of gait and mobility: Secondary | ICD-10-CM

## 2023-05-30 NOTE — Telephone Encounter (Signed)
 Faxed signed order below to [facility], received fax confirmation.

## 2023-05-30 NOTE — Telephone Encounter (Signed)
 Spoke w/ Dr. Epimenio Foot, he approved writing rx for pt.

## 2023-06-01 ENCOUNTER — Telehealth: Payer: Self-pay | Admitting: Orthopaedic Surgery

## 2023-07-21 NOTE — Progress Notes (Signed)
 Chief Complaint  Patient presents with   RM2/MS    Pt is here Alone. Pt states that she is managing with her MS.     HISTORY OF PRESENT ILLNESS:  07/24/23 ALL:  Brooke [name] is a 38 y.o. female here today for follow up for RRMS. She was last seen by Dr Godwin Lat 12/2022. She reported progressive difficulty with gait and balance. DMF switched to Kesimpta.   She is doing well. No new or exacerbating symptoms. Gait is stable. She uses cane at all times. Balance is off at times but seems stable. No falls.   Dysesthesias seems to be stable on gabapentin  600mg  three times daily. She also has occasional muscle cramps in her calves. She was given 20 tablets of cyclobenzaprine  in ER 09/2022. She reports that she just took last tablet a week ago. It seems to really help. She admits that she doesn't drink much water.   Left hand numbness continues. Brace isn't helping. She is planning to discuss referral to ortho with her PCP.   Headaches are rare. No recent concerns.   She takes Adderall 30mg  daily only when she feels she needs it. Usually 2-3 times a week. Last filled 12/2022 for 30 tablets. She still has some left. Fatigue is stable. She recently moved to a new home. Mood is good. She hasn't taken buspirone  lately. She is sleeping normally. She has not taken nortriptyline  or buspirone , recently.   Urge incontinence continues. She wears a pad daily. Vesicare  helps.    HISTORY (copied from Dr Thom Fleeting previous note)  Brooke [name] is a 38 y.o.  woman with multiple sclerosis.       Update 01/03/2023: She is on DMF (restarted 03/2021 after her daughter was born.    She is tolerating it well but has compliance issues and often misses a dose.    No exacerbation though she notes left hand pain is worse.   She also has had a sharp pain shooting in her head.      Gait is off due to reduced balance   The  left leg was weaker than right - but she feels about the same now.  Ampyra  had no benefit.    She has some tingling in her fingertips.   Arms strong.      She is numb in the leg with dysesthesias  - needles and pins or burning.   She fell a few times on the left knee and now has a patch of numbness below inner knee associated with burning painful dysesthesias.  She has left hand > right numbness.    She takes gabapentin  300 mg po tid and tolerates it well.      She is experiencing frontal headaches, also left occipital pain.  Sometime shas a shooting pain in her head.   NSAIDs helps a  little bit.      She has urinary urge incontinence that did better with solifenacin  - works better than oxybutynin .       She has eye pain but no change in vision.    She also has DM.    She has some fatigue.  She is sleeping poorly waking up for the bay every few hours.  She falls back asleep ok as very tired.     She denies depression but has some anxiety.  She takes xanax  prn.    Cognition is doing ok.      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  We discussed trying to lose weight would help DM and MS.      MS History She was diagnosed with MS in 2016 after presenting with visual disturbance, diplopia and poor gait.    She was initially placed on Tecfidera .   She did not note any benefit desired to switch medications and started Ocrevus .  She did not feel good on that medication.  She switched to see me in 2019.  We switched to Tysabri .  Unfortunately, she had allergic reaction after her second infusion and went back on Tecfidera .  She was off for several months while pregnant but went back on after miscarriage and December 2020, then off for pregnancy in 2022 and back on 03/2021   Imaging MRI of the brain 05/13/2016 showed multiple T2/FLAIR hyperintense foci in the pons and hemispheres in a pattern and configuration consistent with chronic demyelinating plaque associated with multiple sclerosis. None of the foci appears to be acute. When compared to the MRI dated  01/11/2015, there is no interval change.    There is a normal enhancement pattern and there are no acute findings.   MRI of the cervical spine 05/13/2016 Foci within the posterior spinal cord adjacent to C3 and the left spinal cord adjacent to C5.   Neither of these appear to be acute.      Minimal disc degenerative changes at C5-C6 and C6-C7 that did not lead to any nerve root impingement.    There is a normal enhancement pattern and there are no acute findings.   MRI of the thoracic spine 05/16/2016 showed a focus within the left anterior spinal cord adjacent to T8-T9 consistent with a demyelinating plaque associated with multiple sclerosis. It appears to be chronic and does not enhance. There is a small focus to the right adjacent to T3-T4.   It is noted on the axial images but not the sagittal images and could represent a small chronic demyelinating plaque or be artifactual. There is small right lateral disc protrusion at T11-T12. There is no nerve root compression..   There is a normal enhancement pattern.   MRI of the cervical spine 08/07/2019 showed a T2 hyperintense foci within the spinal cord adjacent to C2, C3 and C5.  None of the foci appear to be acute.  They do not enhance.  They were present on the MRI from 05/12/2016.   Mild disc degenerative changes at C5-C6 and C6-C7 that do not lead to spinal stenosis or nerve root compression.   MRI of the brain 08/09/2019 showed multiple T2/FLAIR hyperintense foci in the pons and hemispheres in a pattern configuration consistent with chronic demyelinating plaque associated with multiple sclerosis.  None of the foci appear to be acute and they do not enhance.  Compared to the MRI dated 05/12/2016, there are no definite new lesions.  No acute findings and a normal enhancement pattern.   MRI brain 08/07/2019 shows multiple T2/FLAIR hyperintense foci in the pons and hemispheres in a pattern configuration consistent with chronic demyelinating plaque associated with  multiple sclerosis.  None of the foci appear to be acute and they do not enhance.  Compared to the MRI dated 05/12/2016, there are no definite new lesions   MRI cervical spine 08/09/2019  T2 hyperintense foci within the spinal cord adjacent to C2, C3 and C5.  None of the foci appear to be acute.  They do not enhance.  They were present on the MRI from 05/12/2016. 2.  Mild disc degenerative changes at C5-C6 and C6-C7 that do not lead to spinal stenosis or nerve root compression.   REVIEW OF SYSTEMS: Out of a complete 14 system review of symptoms, the patient complains only of the following symptoms, muscle cramps, numbness, tingling, headaches, urinary urgency and all other reviewed systems are negative.   ALLERGIES: Allergies  Allergen Reactions   Lexapro [Escitalopram Oxalate] Other (See Comments)    Suicidal thoughts, lowered libido   Tysabri  [Natalizumab ] Hives    Hives, itching, elevated HR and BP.   Latex Hives     HOME MEDICATIONS: Outpatient Medications Prior to Visit  Medication Sig Dispense Refill   acetaminophen  (TYLENOL ) 500 MG tablet Take 2 tablets (1,000 mg total) by mouth every 8 (eight) hours as needed (pain). 60 tablet 0   ALPRAZolam  (XANAX ) 0.5 MG tablet Take 0.5 mg by mouth daily as needed.     gabapentin  (NEURONTIN ) 600 MG tablet TAKE 1 TABLET BY MOUTH THREE TIMES A DAY 90 tablet 3   naproxen  (NAPROSYN ) 500 MG tablet TAKE 1 TABLET BY MOUTH TWICE A DAY WITH FOOD 60 tablet 3   Ofatumumab (KESIMPTA) 20 MG/0.4ML SOAJ Inject 20 mg into the skin every 28 (twenty-eight) days.     TRULICITY 1.5 MG/0.5ML SOAJ Inject 1.5 mg into the skin once a week.     Vitamin D , Ergocalciferol , (DRISDOL ) 1.25 MG (50000 UNIT) CAPS capsule Take 1 capsule (50,000 Units total) by mouth every 7 (seven) days. 13 capsule 3   cyclobenzaprine  (FLEXERIL ) 10 MG tablet Take 1 tablet (10 mg total) by mouth 2 (two) times daily as needed for muscle spasms. 20 tablet 0   amphetamine -dextroamphetamine  (ADDERALL XR) 30 MG 24 hr capsule Take 1 capsule (30 mg total) by mouth daily. (Patient not taking: Reported on 07/24/2023) 30 capsule 0   busPIRone  (BUSPAR ) 15 MG tablet TAKE 1 TABLET BY MOUTH 2 TIMES DAILY. (Patient not taking: Reported on 07/24/2023) 180 tablet 2   hydrochlorothiazide  (MICROZIDE ) 12.5 MG capsule Take 1 capsule (12.5 mg total) by mouth daily. (Patient not taking: Reported on 07/24/2023) 90 capsule 3   solifenacin  (VESICARE ) 10 MG tablet Take 1 tablet (10 mg total) by mouth daily. (Patient not taking: Reported on 07/24/2023) 90 tablet 3   Blood Pressure Monitor MISC For regular home bp monitoring during pregnancy (Patient not taking: Reported on 07/24/2023) 1 each 0   fluconazole  (DIFLUCAN ) 150 MG tablet Take 1 now and 1 in 3 days if needed (Patient not taking: Reported on 07/24/2023) 2 tablet 1   ibuprofen  (ADVIL ) 600 MG tablet Take 1 tablet (600 mg total) by mouth every 6 (six) hours as needed. (Patient not taking: Reported on 07/24/2023) 30 tablet 0   nortriptyline  (PAMELOR ) 25 MG capsule Take 1 capsule (25 mg total) by mouth at bedtime. (Patient not taking: Reported on 07/24/2023) 90 capsule 3   Prenatal Vit-Fe Fumarate-FA (PNV PRENATAL PLUS MULTIVITAMIN) 27-1 MG TABS Take 1 daily (Patient not taking: Reported on 07/24/2023) 30 tablet 12   No facility-administered medications prior to visit.     PAST MEDICAL HISTORY: Past Medical History:  Diagnosis Date   Anxiety    Chlamydia 02/28/2020   Treated 02/28/20 POC________   Chronic back pain    Depression    Diabetes mellitus without complication (HCC)    Edema    Gestational diabetes    Hypertension    MS (multiple sclerosis) (HCC) 09/29/2014     PAST SURGICAL HISTORY: Past Surgical History:  Procedure Laterality Date  APPENDECTOMY     CHOLECYSTECTOMY N/A 10/07/2013   Procedure: LAPAROSCOPIC CHOLECYSTECTOMY;  Surgeon: Beau Bound, MD;  Location: [facility];  Service: General;  Laterality: N/A;   LAPAROSCOPIC APPENDECTOMY N/A  05/12/2015   Procedure: APPENDECTOMY LAPAROSCOPIC;  Surgeon: Alanda Allegra, MD;  Location: [facility];  Service: General;  Laterality: N/A;   WISDOM TOOTH EXTRACTION       FAMILY HISTORY: Family History  Problem Relation Age of Onset   Diabetes Mother    Colon polyps Mother        hx of cancer   Other Mother        DDD [place], cervical   Sleep apnea Father      SOCIAL HISTORY: Social History   Socioeconomic History   Marital status: Single    Spouse name: Not on file   Number of children: 1   Years of education: 2 y colleg   Highest education level: Not on file  Occupational History   Occupation: work  Tobacco Use   Smoking status: Former    Current packs/day: 0.00    Types: Cigarettes, E-cigarettes    Quit date: 09/16/2007    Years since quitting: 15.8   Smokeless tobacco: Never  Vaping Use   Vaping status: Former  Substance and Sexual Activity   Alcohol use: Not Currently   Drug use: Not Currently    Types: Marijuana    Comment: occ   Sexual activity: Yes    Birth control/protection: I.U.D.  Other Topics Concern   Not on file  Social History Narrative   Drinks 1-2 large glasses of caffeine daily.      Social Drivers of Corporate investment banker Strain: Low Risk  (05/05/2021)   Overall Financial Resource Strain (CARDIA)    Difficulty of Paying Living Expenses: Not very hard  Food Insecurity: No Food Insecurity (05/05/2021)   Hunger Vital Sign    Worried About Running Out of Food in the Last Year: Never true    Ran Out of Food in the Last Year: Never true  Transportation Needs: No Transportation Needs (05/05/2021)   PRAPARE - Administrator, Civil Service (Medical): No    Lack of Transportation (Non-Medical): No  Physical Activity: Insufficiently Active (05/05/2021)   Exercise Vital Sign    Days of Exercise per Week: 2 days    Minutes of Exercise per Session: 30 min  Stress: No Stress Concern Present (05/05/2021)   [employer] of  Occupational Health - Occupational Stress Questionnaire    Feeling of Stress : Not at all  Social Connections: Moderately Integrated (05/05/2021)   Social Connection and Isolation Panel [NHANES]    Frequency of Communication with Friends and Family: More than three times a week    Frequency of Social Gatherings with Friends and Family: Three times a week    Attends Religious Services: More than 4 times per year    Active Member of Clubs or Organizations: Yes    Attends Banker Meetings: More than 4 times per year    Marital Status: Widowed  Intimate Partner Violence: Not At Risk (05/05/2021)   Humiliation, Afraid, Rape, and Kick questionnaire    Fear of Current or Ex-Partner: No    Emotionally Abused: No    Physically Abused: No    Sexually Abused: No     PHYSICAL EXAM  Vitals:   07/24/23 1428  BP: 132/78  Pulse: 79  Weight: (!) 348 lb (157.9 kg)  Height: 5\' 5"  (  1.651 m)   Body mass index is 57.91 kg/m.  Generalized: Well developed, in no acute distress  Cardiology: normal rate and rhythm, no murmur auscultated  Respiratory: clear to auscultation bilaterally    Neurological examination  Mentation: Alert oriented to time, place, history taking. Follows all commands speech and language fluent Cranial nerve II-XII: Pupils were equal round reactive to light. Extraocular movements were full, visual field were full on confrontational test. Facial sensation and strength were normal. Uvula tongue midline. Head turning and shoulder shrug  were normal and symmetric. Motor: The motor testing reveals 5 over 5 strength of all 4 extremities with exception of slight weakness (4+/5) left hip flexion. Good symmetric motor tone is noted throughout.  Sensory: Sensory testing is intact to soft touch on all 4 extremities. No evidence of extinction is noted.  Coordination: Cerebellar testing reveals good finger-nose-finger and heel-to-shin bilaterally.  Gait and station: Station  normal. Gait is spastic, left footdrop. Stable with cane. Unable to tandem, today.  Reflexes: Deep tendon reflexes are symmetric and normal bilaterally.    DIAGNOSTIC DATA (LABS, IMAGING, TESTING) - I reviewed patient records, labs, notes, testing and imaging myself where available.  Lab Results  Component Value Date   WBC 5.1 01/03/2023   HGB 13.4 01/03/2023   HCT 38.0 01/03/2023   MCV 86 01/03/2023   PLT 276 01/03/2023      Component Value Date/Time   NA 140 01/03/2023 1612   K 4.0 01/03/2023 1612   CL 102 01/03/2023 1612   CO2 26 01/03/2023 1612   GLUCOSE 87 01/03/2023 1612   GLUCOSE 98 02/25/2018 1650   BUN 5 (L) 01/03/2023 1612   CREATININE 0.83 01/03/2023 1612   CALCIUM  9.4 01/03/2023 1612   PROT 7.5 01/03/2023 1612   ALBUMIN 3.9 01/03/2023 1612   AST 11 01/03/2023 1612   ALT 10 01/03/2023 1612   ALKPHOS 84 01/03/2023 1612   BILITOT 0.4 01/03/2023 1612   GFRNONAA 117 07/03/2019 1624   GFRAA 135 07/03/2019 1624   No results found for: "CHOL", "HDL", "LDLCALC", "LDLDIRECT", "TRIG", "CHOLHDL" Lab Results  Component Value Date   HGBA1C 6.4 (H) 10/20/2021   No results found for: "VITAMINB12" No results found for: "TSH"     07/15/2015   11:10 AM 04/01/2015    9:28 AM  MMSE - Mini Mental State Exam  Orientation to time 4 3  Orientation to Place 5 5  Registration 3 3  Attention/ Calculation 5 0  Recall 3 2  Language- name 2 objects 2 2  Language- repeat 1 0  Language- follow 3 step command 3 3  Language- read & follow direction 1 1  Write a sentence 1 1  Copy design 1 1  Total score 29 21        04/22/2014    4:13 PM  Montreal Cognitive Assessment   Visuospatial/ Executive (0/5) 3  Naming (0/3) 3  Attention: Read list of digits (0/2) 2  Attention: Read list of letters (0/1) 1  Attention: Serial 7 subtraction starting at 100 (0/3) 3  Language: Repeat phrase (0/2) 1  Language : Fluency (0/1) 1  Abstraction (0/2) 0  Delayed Recall (0/5) 4  Orientation  (0/6) 6  Total 24     ASSESSMENT AND PLAN  38 y.o. year old female  has a past medical history of Anxiety, Chlamydia (02/28/2020), Chronic back pain, Depression, Diabetes mellitus without complication (HCC), Edema, Gestational diabetes, Hypertension, and MS (multiple sclerosis) (HCC) (09/29/2014). here with  Multiple sclerosis (HCC) - Plan: CBC with Differential/Platelets, IgG, IgA, IgM  High risk medication use  Gait disturbance  Other fatigue  Attention deficit disorder, unspecified type  Left carpal tunnel syndrome  Urinary urgency  Brooke [name] reports MS symptoms are stable. We will continue Kesimpta monthly. Advised to set alarm for refill reminder. I will update labs, today. She will continue gabapentin  600mg  three times daily and Vesicare  daily. Continue Adderall, cyclobenzaprine , and buspirone  as needed. Nortriptyline  discontinued for now as she does not feel it is needed. She was advised to discuss referral to ortho with PCP. Healthy lifestyle habits encouraged. She will follow up with PCP as directed. She will return to see Dr Godwin Lat in 6 months, sooner if needed. She verbalizes understanding and agreement with this plan.   Orders Placed This Encounter  Procedures   CBC with Differential/Platelets   IgG, IgA, IgM     Meds ordered this encounter  Medications   cyclobenzaprine  (FLEXERIL ) 10 MG tablet    Sig: Take 1 tablet (10 mg total) by mouth 2 (two) times daily as needed for muscle spasms.    Dispense:  20 tablet    Refill:  0    Supervising Provider:   AHERN, ANTONIA B [id number]   I personally spent a total of 40 minutes in the care of the patient today including preparing to see the patient, getting/reviewing separately obtained history, performing a medically appropriate exam/evaluation, counseling and educating, placing orders, documenting clinical information in the EHR, independently interpreting results, communicating results, and coordinating care.    Terrilyn Fick, MSN, FNP-C 07/24/2023, 3:38 PM  [facility] [address], [address] [place], [place] [address] [phone]

## 2023-07-21 NOTE — Patient Instructions (Signed)
 Below is our plan:  We will continue current treatment plan. Talk with PCP about ortho referral for carpal tunnel symptoms. Continue Kesimpta every month. Set a reminder for refills or ask about auto refills. Continue gabapentin  600mg  three times daily and Vesicare  daily. We will discontinue nortriptyline . Continue Adderall, cyclobenzaprine  and buspirone  as needed.   Please make sure you are staying well hydrated. I recommend 50-60 ounces daily. Well balanced diet and regular exercise encouraged. Consistent sleep schedule with 6-8 hours recommended.   Please continue follow up with care team as directed.   Follow up with Dr Godwin Lat in 6 months   You may receive a survey regarding today's visit. I encourage you to leave honest feed back as I do use this information to improve patient care. Thank you for seeing me today!

## 2023-07-24 ENCOUNTER — Encounter: Payer: Self-pay | Admitting: Family Medicine

## 2023-07-24 ENCOUNTER — Ambulatory Visit: Payer: Medicaid Other | Admitting: Family Medicine

## 2023-07-24 VITALS — BP 132/78 | HR 79 | Ht 65.0 in | Wt 348.0 lb

## 2023-07-24 DIAGNOSIS — F988 Other specified behavioral and emotional disorders with onset usually occurring in childhood and adolescence: Secondary | ICD-10-CM

## 2023-07-24 DIAGNOSIS — G35D Multiple sclerosis, unspecified: Secondary | ICD-10-CM

## 2023-07-24 DIAGNOSIS — Z79899 Other long term (current) drug therapy: Secondary | ICD-10-CM | POA: Diagnosis not present

## 2023-07-24 DIAGNOSIS — R5383 Other fatigue: Secondary | ICD-10-CM | POA: Diagnosis not present

## 2023-07-24 DIAGNOSIS — G35 Multiple sclerosis: Secondary | ICD-10-CM

## 2023-07-24 DIAGNOSIS — R269 Unspecified abnormalities of gait and mobility: Secondary | ICD-10-CM

## 2023-07-24 DIAGNOSIS — G5602 Carpal tunnel syndrome, left upper limb: Secondary | ICD-10-CM

## 2023-07-24 DIAGNOSIS — R3915 Urgency of urination: Secondary | ICD-10-CM

## 2023-07-24 MED ORDER — CYCLOBENZAPRINE HCL 10 MG PO TABS: 10.0000 mg | ORAL_TABLET | Freq: Two times a day (BID) | ORAL | 0 refills | Status: DC | PRN

## 2023-07-25 ENCOUNTER — Ambulatory Visit: Payer: Self-pay | Admitting: Family Medicine

## 2023-07-25 LAB — IGG, IGA, IGM
IgA/Immunoglobulin A, Serum: 274 mg/dL (ref 87–352)
IgG (Immunoglobin G), Serum: 1879 mg/dL — ABNORMAL HIGH (ref 586–1602)
IgM (Immunoglobulin M), Srm: 43 mg/dL (ref 26–217)

## 2023-07-25 LAB — CBC WITH DIFFERENTIAL/PLATELET
Basophils Absolute: 0 10*3/uL (ref 0.0–0.2)
Basos: 0 %
EOS (ABSOLUTE): 0.1 10*3/uL (ref 0.0–0.4)
Eos: 1 %
Hematocrit: 38.1 % (ref 34.0–46.6)
Hemoglobin: 13.4 g/dL (ref 11.1–15.9)
Immature Grans (Abs): 0 10*3/uL (ref 0.0–0.1)
Immature Granulocytes: 0 %
Lymphocytes Absolute: 2 10*3/uL (ref 0.7–3.1)
Lymphs: 34 %
MCH: 30.3 pg (ref 26.6–33.0)
MCHC: 35.2 g/dL (ref 31.5–35.7)
MCV: 86 fL (ref 79–97)
Monocytes Absolute: 0.3 10*3/uL (ref 0.1–0.9)
Monocytes: 5 %
Neutrophils Absolute: 3.5 10*3/uL (ref 1.4–7.0)
Neutrophils: 60 %
Platelets: 278 10*3/uL (ref 150–450)
RBC: 4.42 x10E6/uL (ref 3.77–5.28)
RDW: 13.2 % (ref 11.7–15.4)
WBC: 5.8 10*3/uL (ref 3.4–10.8)

## 2023-08-09 ENCOUNTER — Telehealth: Payer: Self-pay | Admitting: *Deleted

## 2023-08-09 NOTE — Telephone Encounter (Signed)
 Mailed signed order below to [employer].

## 2023-08-22 ENCOUNTER — Other Ambulatory Visit: Payer: Self-pay

## 2023-08-22 ENCOUNTER — Other Ambulatory Visit: Payer: Self-pay | Admitting: Neurology

## 2023-08-22 NOTE — Telephone Encounter (Signed)
 Last seen on 07/24/23 Follow up scheduled on 02/20/24

## 2023-10-02 ENCOUNTER — Encounter: Payer: Self-pay | Admitting: Neurology

## 2023-10-03 ENCOUNTER — Other Ambulatory Visit: Payer: Self-pay | Admitting: *Deleted

## 2023-10-03 DIAGNOSIS — G35 Multiple sclerosis: Secondary | ICD-10-CM

## 2023-10-03 DIAGNOSIS — R29898 Other symptoms and signs involving the musculoskeletal system: Secondary | ICD-10-CM

## 2023-10-03 DIAGNOSIS — R269 Unspecified abnormalities of gait and mobility: Secondary | ICD-10-CM

## 2023-10-03 NOTE — Telephone Encounter (Signed)
 Orders were faxed to [phone]. Received fax confirmation.  We received fax update from [facility]:

## 2023-10-13 ENCOUNTER — Encounter: Payer: Self-pay | Admitting: Radiology

## 2023-10-22 ENCOUNTER — Encounter: Payer: Self-pay | Admitting: Neurology

## 2023-10-25 ENCOUNTER — Telehealth: Payer: Self-pay | Admitting: Neurology

## 2023-10-25 NOTE — Telephone Encounter (Signed)
   I called pt.  Last Kesimpta injection 09/29/23. Sx started 2 wk after injection. Denies any illness/infection currently.  Started Kesimpta around 01/2023 and has tolerated thus far. Pain in head started 2 weeks ago and worsening. Scheduled work in appt 10/26/23 at 3p with Dr. Vear, check in 230pm.

## 2023-10-25 NOTE — Telephone Encounter (Signed)
 Pt called  stating she might be having side effects from Ofatumumab (KESIMPTA) 20 MG/0.4ML SOAJ    Pt has been experiencing shortness of breath and weakness in her body the patient states it feels like somebody is stabbing her in her brain . Pt states she had  been taking all prescribe medication but something is off. Pt is concern and not sure what to do .

## 2023-10-26 ENCOUNTER — Encounter: Payer: Self-pay | Admitting: Neurology

## 2023-10-26 ENCOUNTER — Ambulatory Visit: Admitting: Neurology

## 2023-10-26 ENCOUNTER — Other Ambulatory Visit: Payer: Self-pay | Admitting: Neurology

## 2023-10-26 VITALS — BP 123/70 | HR 97 | Ht 65.0 in | Wt 337.5 lb

## 2023-10-26 DIAGNOSIS — Z79899 Other long term (current) drug therapy: Secondary | ICD-10-CM | POA: Diagnosis not present

## 2023-10-26 DIAGNOSIS — G35 Multiple sclerosis: Secondary | ICD-10-CM | POA: Diagnosis not present

## 2023-10-26 DIAGNOSIS — R29898 Other symptoms and signs involving the musculoskeletal system: Secondary | ICD-10-CM

## 2023-10-26 DIAGNOSIS — R269 Unspecified abnormalities of gait and mobility: Secondary | ICD-10-CM

## 2023-10-26 MED ORDER — SOLIFENACIN SUCCINATE 10 MG PO TABS: 10.0000 mg | ORAL_TABLET | Freq: Every day | ORAL | 3 refills | Status: AC

## 2023-10-26 MED ORDER — GABAPENTIN 600 MG PO TABS: 600.0000 mg | ORAL_TABLET | Freq: Four times a day (QID) | ORAL | 11 refills | Status: AC

## 2023-10-26 NOTE — Progress Notes (Signed)
 [facility]  PATIENT: Brooke [name] DOB: 1985-05-09  REFERRING DOCTOR OR PCP:  Dr. Chalice; PCP is Aliene Colon SOURCE: Patient, notes from Dr. Chalice, imaging and laboratory reports, MRI images on PACS.  _________________________________   HISTORICAL  CHIEF COMPLAINT:  Chief Complaint  Patient presents with   Follow-up    Pt in room 10. Husband in small child in room.Here for worsening MS symptoms. Pt said she feels a stabbing sensation in head, comes and goes.     HISTORY OF PRESENT ILLNESS:  Brooke [name] is a 38 y.o.  woman with multiple sclerosis.      Update 10/26/2023: She reports pain in the head and neck lasting 1-5 minutes at a time.   These occur most days.    She does not do anyhing that brings them on.  Quality of pain is sharp stabbing.   She has some soreness in the back of the head.     She is on gabapentin  600 mg po tid  For MS, she is on Kesimpta - starting March 2025 .  She had compliance issues with DMF.  o exacerbation though she notes left hand pain is worse.   She also has had a sharp pain shooting in her head.     Gait is off due to reduced balance   The  left leg was weaker than right - but she feels about the same now.  Ampyra  had no benefit.   Left hand pain is better.   Arms strong.   She is numb in the leg with dysesthesias  - needles and pins or burning.   She fell a few times on the left knee and now has a patch of numbness below inner knee associated with burning painful dysesthesias.    She has urinary urge incontinence that did better with solifenacin  - works better than oxybutynin .      She has some fatigue.  She is sleeping poorly waking up for the bay every few hours.  She falls back asleep ok as very tired.    She denies depression but has some anxiety.  She takes xanax  prn.    Cognition is doing ok.     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   .  We discussed trying to lose  weight would help DM and MS.    MS History She was diagnosed with MS in 2016 after presenting with visual disturbance, diplopia and poor gait.    She was initially placed on Tecfidera .   She did not note any benefit desired to switch medications and started Ocrevus .  She did not feel good on that medication.  She switched to see me in 2019.  We switched to Tysabri .  Unfortunately, she had allergic reaction after her second infusion and went back on Tecfidera .  She was off for several months while pregnant but went back on after miscarriage and December 2020, then off for pregnancy in 2022 and back on 03/2021.    She restarted DMF but due to s.e. and worsening MS switched to Kesimpta in 03/2023.    Imaging MRI of the brain 05/13/2016 showed multiple T2/FLAIR hyperintense foci in the pons and hemispheres in a pattern and configuration consistent with chronic demyelinating plaque associated with multiple sclerosis. None of the foci appears to be acute. When compared to the MRI dated 01/11/2015, there is no interval change.    There is a  normal enhancement pattern and there are no acute findings.  MRI of the cervical spine 05/13/2016 Foci within the posterior spinal cord adjacent to C3 and the left spinal cord adjacent to C5.   Neither of these appear to be acute.      Minimal disc degenerative changes at C5-C6 and C6-C7 that did not lead to any nerve root impingement.    There is a normal enhancement pattern and there are no acute findings.  MRI of the thoracic spine 05/16/2016 showed a focus within the left anterior spinal cord adjacent to T8-T9 consistent with a demyelinating plaque associated with multiple sclerosis. It appears to be chronic and does not enhance. There is a small focus to the right adjacent to T3-T4.   It is noted on the axial images but not the sagittal images and could represent a small chronic demyelinating plaque or be artifactual. There is small right lateral disc protrusion at T11-T12.  There is no nerve root compression..   There is a normal enhancement pattern.  MRI of the cervical spine 08/07/2019 showed a T2 hyperintense foci within the spinal cord adjacent to C2, C3 and C5.  None of the foci appear to be acute.  They do not enhance.  They were present on the MRI from 05/12/2016.   Mild disc degenerative changes at C5-C6 and C6-C7 that do not lead to spinal stenosis or nerve root compression.   MRI of the brain 08/09/2019 showed multiple T2/FLAIR hyperintense foci in the pons and hemispheres in a pattern configuration consistent with chronic demyelinating plaque associated with multiple sclerosis.  None of the foci appear to be acute and they do not enhance.  Compared to the MRI dated 05/12/2016, there are no definite new lesions.  No acute findings and a normal enhancement pattern.  MRI brain 08/07/2019 shows multiple T2/FLAIR hyperintense foci in the pons and hemispheres in a pattern configuration consistent with chronic demyelinating plaque associated with multiple sclerosis.  None of the foci appear to be acute and they do not enhance.  Compared to the MRI dated 05/12/2016, there are no definite new lesions  MRI cervical spine 08/09/2019  T2 hyperintense foci within the spinal cord adjacent to C2, C3 and C5.  None of the foci appear to be acute.  They do not enhance.  They were present on the MRI from 05/12/2016. 2.   Mild disc degenerative changes at C5-C6 and C6-C7 that do not lead to spinal stenosis or nerve root compression.  REVIEW OF SYSTEMS: Constitutional: No fevers, chills, sweats, or change in appetite.  She notes fatigue. Eyes: No visual changes, double vision, eye pain Ear, nose and throat: No hearing loss, ear pain, nasal congestion, sore throat Cardiovascular: No chest pain, palpitations Respiratory:  No shortness of breath at rest or with exertion.   No wheezes GastrointestinaI: No nausea, vomiting, diarrhea, abdominal pain, fecal incontinence Genitourinary:  No  dysuria, urinary retention or frequency.  No nocturia. Musculoskeletal:  No neck pain, back pain Integumentary: No rash, pruritus, skin lesions Neurological: as above Psychiatric: She denies depression or anxiety.   Endocrine: No palpitations, diaphoresis, change in appetite, change in weigh or increased thirst Hematologic/Lymphatic:  No anemia, purpura, petechiae. Allergic/Immunologic: No itchy/runny eyes, nasal congestion, recent allergic reactions, rashes  ALLERGIES: Allergies  Allergen Reactions   Lexapro [Escitalopram Oxalate] Other (See Comments)    Suicidal thoughts, lowered libido   Tysabri  [Natalizumab ] Hives    Hives, itching, elevated HR and BP.   Latex Hives  HOME MEDICATIONS:  Current Outpatient Medications:    acetaminophen  (TYLENOL ) 500 MG tablet, Take 2 tablets (1,000 mg total) by mouth every 8 (eight) hours as needed (pain)., Disp: 60 tablet, Rfl: 0   ALPRAZolam  (XANAX ) 0.5 MG tablet, Take 0.5 mg by mouth daily as needed., Disp: , Rfl:    amphetamine -dextroamphetamine (ADDERALL XR) 30 MG 24 hr capsule, Take 1 capsule (30 mg total) by mouth daily., Disp: 30 capsule, Rfl: 0   cyclobenzaprine  (FLEXERIL ) 10 MG tablet, Take 1 tablet (10 mg total) by mouth 2 (two) times daily as needed for muscle spasms., Disp: 20 tablet, Rfl: 0   naproxen  (NAPROSYN ) 500 MG tablet, TAKE 1 TABLET BY MOUTH TWICE A DAY WITH FOOD, Disp: 60 tablet, Rfl: 3   Ofatumumab (KESIMPTA) 20 MG/0.4ML SOAJ, Inject 20 mg into the skin every 28 (twenty-eight) days., Disp: , Rfl:    TRULICITY 1.5 MG/0.5ML SOAJ, Inject 1.5 mg into the skin once a week., Disp: , Rfl:    Vitamin D , Ergocalciferol , (DRISDOL ) 1.25 MG (50000 UNIT) CAPS capsule, Take 1 capsule (50,000 Units total) by mouth every 7 (seven) days., Disp: 13 capsule, Rfl: 3   busPIRone  (BUSPAR ) 15 MG tablet, TAKE 1 TABLET BY MOUTH 2 TIMES DAILY. (Patient not taking: Reported on 10/26/2023), Disp: 180 tablet, Rfl: 2   gabapentin  (NEURONTIN ) 600 MG tablet,  Take 1 tablet (600 mg total) by mouth in the morning, at noon, in the evening, and at bedtime., Disp: 120 tablet, Rfl: 11   hydrochlorothiazide  (MICROZIDE ) 12.5 MG capsule, Take 1 capsule (12.5 mg total) by mouth daily. (Patient not taking: Reported on 10/26/2023), Disp: 90 capsule, Rfl: 3   solifenacin  (VESICARE ) 10 MG tablet, Take 1 tablet (10 mg total) by mouth daily. (Patient not taking: Reported on 10/26/2023), Disp: 90 tablet, Rfl: 3  PAST MEDICAL HISTORY: Past Medical History:  Diagnosis Date   Anxiety    Chlamydia 02/28/2020   Treated 02/28/20 POC________   Chronic back pain    Depression    Diabetes mellitus without complication (HCC)    Edema    Gestational diabetes    Hypertension    MS (multiple sclerosis) (HCC) 09/29/2014    PAST SURGICAL HISTORY: Past Surgical History:  Procedure Laterality Date   APPENDECTOMY     CHOLECYSTECTOMY N/A 10/07/2013   Procedure: LAPAROSCOPIC CHOLECYSTECTOMY;  Surgeon: Oneil DELENA Budge, MD;  Location: [facility];  Service: General;  Laterality: N/A;   LAPAROSCOPIC APPENDECTOMY N/A 05/12/2015   Procedure: APPENDECTOMY LAPAROSCOPIC;  Surgeon: Oneil Budge, MD;  Location: [facility];  Service: General;  Laterality: N/A;   WISDOM TOOTH EXTRACTION      FAMILY HISTORY: Family History  Problem Relation Age of Onset   Diabetes Mother    Colon polyps Mother        hx of cancer   Other Mother        DDD Lumber, cervical   Sleep apnea Father     SOCIAL HISTORY:  Social History   Socioeconomic History   Marital status: Single    Spouse name: Not on file   Number of children: 1   Years of education: 2 y colleg   Highest education level: Not on file  Occupational History   Occupation: work  Tobacco Use   Smoking status: Former    Current packs/day: 0.00    Types: Cigarettes, E-cigarettes    Quit date: 09/16/2007    Years since quitting: 16.1   Smokeless tobacco: Never  Vaping Use   Vaping status: Former  Substance and Sexual Activity   Alcohol  use: Not Currently   Drug use: Not Currently    Types: Marijuana    Comment: occ   Sexual activity: Yes    Birth control/protection: I.U.D.  Other Topics Concern   Not on file  Social History Narrative   Drinks 1-2 large glasses of caffeine daily.      Social Drivers of Corporate investment banker Strain: Low Risk  (05/05/2021)   Overall Financial Resource Strain (CARDIA)    Difficulty of Paying Living Expenses: Not very hard  Food Insecurity: No Food Insecurity (05/05/2021)   Hunger Vital Sign    Worried About Running Out of Food in the Last Year: Never true    Ran Out of Food in the Last Year: Never true  Transportation Needs: No Transportation Needs (05/05/2021)   PRAPARE - Administrator, Civil Service (Medical): No    Lack of Transportation (Non-Medical): No  Physical Activity: Insufficiently Active (05/05/2021)   Exercise Vital Sign    Days of Exercise per Week: 2 days    Minutes of Exercise per Session: 30 min  Stress: No Stress Concern Present (05/05/2021)   [employer] of Occupational Health - Occupational Stress Questionnaire    Feeling of Stress : Not at all  Social Connections: Moderately Integrated (05/05/2021)   Social Connection and Isolation Panel    Frequency of Communication with Friends and Family: More than three times a week    Frequency of Social Gatherings with Friends and Family: Three times a week    Attends Religious Services: More than 4 times per year    Active Member of Clubs or Organizations: Yes    Attends Banker Meetings: More than 4 times per year    Marital Status: Widowed  Intimate Partner Violence: Not At Risk (05/05/2021)   Humiliation, Afraid, Rape, and Kick questionnaire    Fear of Current or Ex-Partner: No    Emotionally Abused: No    Physically Abused: No    Sexually Abused: No     PHYSICAL EXAM  Vitals:   10/26/23 1511  BP: 123/70  Pulse: 97  Weight: (!) 337 lb 8 oz (153.1 kg)  Height: 5' 5  (1.651 m)     Body mass index is 56.16 kg/m.   General: The patient is well-developed and well-nourished and in no acute distress   Skin: Extremities are without rash .   Mild ankle edema   Neurologic Exam  Mental status: The patient is alert and oriented x 3 at the time of the examination. The patient has apparent normal recent and remote memory, with an apparently normal attention span and concentration ability.   Speech is normal.  Cranial nerves: Extraocular movements are full.  Facial strength and sensation is normal.  Trapezius strength is normal.. No obvious hearing deficits are noted.  Motor:  Muscle bulk is normal.   Tone is mildly increased in the left leg.  Strength was 4+/5 in the left leg and 5/5 on the right.  Sensory: She has normal and symmetric sensation to touch in arms/legs.  Coordination: Cerebellar testing reveals good finger-nose-finger and heel-to-shin bilaterally.  Gait and station: Station is normal.  Gait is wide ad she has a mild left foot drop. Unable to tandem walk Romberg is negative..   Reflexes: Deep tendon reflexes are symmetric and normal in the arms and increased at the knees.SABRA     DIAGNOSTIC DATA (LABS, IMAGING, TESTING) - I reviewed  patient records, labs, notes, testing and imaging myself where available.  Lab Results  Component Value Date   WBC 5.8 07/24/2023   HGB 13.4 07/24/2023   HCT 38.1 07/24/2023   MCV 86 07/24/2023   PLT 278 07/24/2023      Component Value Date/Time   NA 140 01/03/2023 1612   K 4.0 01/03/2023 1612   CL 102 01/03/2023 1612   CO2 26 01/03/2023 1612   GLUCOSE 87 01/03/2023 1612   GLUCOSE 98 02/25/2018 1650   BUN 5 (L) 01/03/2023 1612   CREATININE 0.83 01/03/2023 1612   CALCIUM  9.4 01/03/2023 1612   PROT 7.5 01/03/2023 1612   ALBUMIN 3.9 01/03/2023 1612   AST 11 01/03/2023 1612   ALT 10 01/03/2023 1612   ALKPHOS 84 01/03/2023 1612   BILITOT 0.4 01/03/2023 1612   GFRNONAA 117 07/03/2019 1624   GFRAA  135 07/03/2019 1624   ____________________________________________  Assessment and plan:  Multiple sclerosis (HCC) - Plan: Ambulatory referral to Physical Therapy, IgG, IgA, IgM, CBC with Differential/Platelets  Gait disturbance - Plan: Ambulatory referral to Physical Therapy  Weakness of both lower extremities - Plan: Ambulatory referral to Physical Therapy  High risk medication use - Plan: IgG, IgA, IgM, CBC with Differential/Platelets   1.    Continue Kesimpta . Check labs today  2.    Get back on Vesicare  for urge incontinence  I will send in a prescription.  3.    Stay active and exercise as tolerated.  We discussed weight loss 4.    Return in 6 months she should call sooner if there are new or worsening neurologic symptoms.     Leone Putman A. Vear, MD, PhD, FAAN Certified in Neurology, Clinical Neurophysiology, Sleep Medicine, Pain Medicine and Neuroimaging Director, [facility] at [facility]  [facility] [address], [address] [place], [place] [address] [phone]

## 2023-10-27 ENCOUNTER — Encounter: Payer: Self-pay | Admitting: Neurology

## 2023-10-27 LAB — CBC WITH DIFFERENTIAL/PLATELET
Basophils Absolute: 0 x10E3/uL (ref 0.0–0.2)
Basos: 0 %
EOS (ABSOLUTE): 0.1 x10E3/uL (ref 0.0–0.4)
Eos: 1 %
Hematocrit: 40.9 % (ref 34.0–46.6)
Hemoglobin: 14.2 g/dL (ref 11.1–15.9)
Immature Grans (Abs): 0 x10E3/uL (ref 0.0–0.1)
Immature Granulocytes: 0 %
Lymphocytes Absolute: 1.8 x10E3/uL (ref 0.7–3.1)
Lymphs: 34 %
MCH: 30.5 pg (ref 26.6–33.0)
MCHC: 34.7 g/dL (ref 31.5–35.7)
MCV: 88 fL (ref 79–97)
Monocytes Absolute: 0.3 x10E3/uL (ref 0.1–0.9)
Monocytes: 5 %
Neutrophils Absolute: 3.1 x10E3/uL (ref 1.4–7.0)
Neutrophils: 60 %
Platelets: 274 x10E3/uL (ref 150–450)
RBC: 4.66 x10E6/uL (ref 3.77–5.28)
RDW: 13.3 % (ref 11.7–15.4)
WBC: 5.2 x10E3/uL (ref 3.4–10.8)

## 2023-10-27 LAB — IGG, IGA, IGM
IgA/Immunoglobulin A, Serum: 289 mg/dL (ref 87–352)
IgG (Immunoglobin G), Serum: 1857 mg/dL — ABNORMAL HIGH (ref 586–1602)
IgM (Immunoglobulin M), Srm: 36 mg/dL (ref 26–217)

## 2023-11-06 ENCOUNTER — Ambulatory Visit: Admitting: Adult Health

## 2023-11-06 ENCOUNTER — Encounter: Payer: Self-pay | Admitting: Adult Health

## 2023-11-06 VITALS — BP 123/84 | HR 77 | Ht 65.0 in | Wt 340.0 lb

## 2023-11-06 DIAGNOSIS — R232 Flushing: Secondary | ICD-10-CM | POA: Diagnosis not present

## 2023-11-06 DIAGNOSIS — R4586 Emotional lability: Secondary | ICD-10-CM | POA: Diagnosis not present

## 2023-11-06 DIAGNOSIS — Z975 Presence of (intrauterine) contraceptive device: Secondary | ICD-10-CM

## 2023-11-06 DIAGNOSIS — G35 Multiple sclerosis: Secondary | ICD-10-CM | POA: Diagnosis not present

## 2023-11-06 MED ORDER — NYSTATIN 100000 UNIT/GM EX POWD: 1.0000 | Freq: Three times a day (TID) | CUTANEOUS | 1 refills | Status: DC

## 2023-11-06 NOTE — Progress Notes (Signed)
 Subjective:     Patient ID: [name], female   DOB: December 01, 1985, 38 y.o.   MRN: [id number]  HPI Brooke [name] is a 38 year old black female,single, [id number] in complaining of hot flashes and mood swings, and night sweats too at times.     Component Value Date/Time   DIAGPAP  05/17/2021 1057    - Negative for intraepithelial lesion or malignancy (NILM)   DIAGPAP  05/10/2018 0000    NEGATIVE FOR INTRAEPITHELIAL LESIONS OR MALIGNANCY.   HPVHIGH Negative 05/17/2021 1057   ADEQPAP Satisfactory for evaluation.  The absence of an 05/17/2021 1057   ADEQPAP  05/17/2021 1057    endocervical/transformation zone component is not uncommon in pregnant   ADEQPAP patients. 05/17/2021 1057   PCP is Dr Orpha   Review of Systems +Hot flashes  + mood swings,  + night sweats too at times.   Reviewed past medical,surgical, social and family history. Reviewed medications and allergies.  Objective:   Physical Exam BP 123/84 (BP Location: Left Arm, Patient Position: Sitting, Cuff Size: Normal)   Pulse 77   Ht 5' 5 (1.651 m)   Wt (!) 340 lb (154.2 kg)   LMP 11/02/2023 (Approximate)   BMI 56.58 kg/m     Skin warm and dry. Lungs: clear to ausculation bilaterally. Cardiovascular: regular rate and rhythm.  Fall risk is high    11/06/2023    2:28 PM 05/26/2021    3:25 PM 01/12/2021    8:51 AM  Depression screen PHQ 2/9  Decreased Interest 0 0 2  Down, Depressed, Hopeless 0 0 0  PHQ - 2 Score 0 0 2  Altered sleeping 0 0 1  Tired, decreased energy 3 0 1  Change in appetite 0 0 0  Feeling bad or failure about yourself  0 0 0  Trouble concentrating 0 0 0  Moving slowly or fidgety/restless 0 0 0  Suicidal thoughts 0 0 0  PHQ-9 Score 3 0 4       11/06/2023    2:30 PM 05/26/2021    3:25 PM 01/12/2021    8:51 AM 10/14/2020   11:34 AM  GAD 7 : Generalized Anxiety Score  Nervous, Anxious, on Edge 0 0 0 0  Control/stop worrying 0 0 0 0  Worry too much - different things 1 0 0 1  Trouble relaxing 0  0 1 0  Restless 0 0 0 0  Easily annoyed or irritable 0 0 1 1  Afraid - awful might happen 0 0 0 0  Total GAD 7 Score 1 0 2 2      Upstream - 11/06/23 1427       Pregnancy Intention Screening   Does the patient want to become pregnant in the next year? No    Does the patient's partner want to become pregnant in the next year? No    Would the patient like to discuss contraceptive options today? No      Contraception Wrap Up   Current Method IUD or IUS    End Method IUD or IUS    Contraception Counseling Provided Yes          Assessment:     1. Hot flashes (Primary) Feels hot and sweats Could be related to other meds Will rx nystatin  powders to help with moisture   Meds ordered this encounter  Medications   nystatin  (MYCOSTATIN /NYSTOP ) powder    Sig: Apply 1 Application topically 3 (three) times daily.    Dispense:  60 g    Refill:  1    Supervising Provider:   JAYNE, LUTHER H [[id number]]     2. Mood swings Talk with PCP about meds  3. Multiple sclerosis (HCC)  4. IUD contraception Had IUD placed 05/26/21     Plan:      Return in 6 months for pap and physical

## 2023-11-14 NOTE — Therapy (Signed)
 OUTPATIENT PHYSICAL THERAPY EVALUATION   Patient Name: Brooke [name] MRN: [id number] DOB:02/04/1986, 38 y.o., female Today's Date: 11/15/2023  END OF SESSION:  PT End of Session - 11/15/23 1937     Visit Number 1    Number of Visits 8    Date for Recertification  01/10/24    Authorization Type [employer], auth requried after eval.    PT Start Time 1345    PT Stop Time 1420    PT Time Calculation (min) 35 min    Activity Tolerance Patient tolerated treatment well    Behavior During Therapy [facility] for tasks assessed/performed          Past Medical History:  Diagnosis Date   Anxiety    Chlamydia 02/28/2020   Treated 02/28/20 POC________   Chronic back pain    Depression    Diabetes mellitus without complication (HCC)    Edema    Gestational diabetes    Hypertension    MS (multiple sclerosis) 09/29/2014   Past Surgical History:  Procedure Laterality Date   APPENDECTOMY     CHOLECYSTECTOMY N/A 10/07/2013   Procedure: LAPAROSCOPIC CHOLECYSTECTOMY;  Surgeon: Oneil DELENA Budge, MD;  Location: [facility];  Service: General;  Laterality: N/A;   LAPAROSCOPIC APPENDECTOMY N/A 05/12/2015   Procedure: APPENDECTOMY LAPAROSCOPIC;  Surgeon: Oneil Budge, MD;  Location: [facility];  Service: General;  Laterality: N/A;   WISDOM TOOTH EXTRACTION     Patient Active Problem List   Diagnosis Date Noted   Hot flashes 11/06/2023   Mood swings 11/06/2023   IUD contraception 11/06/2023   History of gestational diabetes 10/22/2020   Marijuana use 10/19/2020   History of chlamydia 03/31/2020   Vitamin D  deficiency 01/07/2020   High risk medication use 07/03/2019   Right tubal pregnancy without intrauterine pregnancy 01/31/2019   Gait disturbance 02/07/2018   Left leg weakness 11/07/2017   Leg numbness 11/07/2017   Other fatigue 11/07/2017   Acute appendicitis 05/12/2015   Multiple sclerosis (HCC) 09/29/2014   Positive ANA (antinuclear antibody) 07/02/2014   Abnormal MRI of head 04/22/2014    Transient diplopia 04/22/2014   Ataxia 04/22/2014   Spell of memory loss 04/22/2014   Recurrent falls while walking 04/22/2014    PCP: Orpha Yancey DELENA, MD   REFERRING PROVIDER: Vear Charlie DELENA, MD   REFERRING DIAG:  G35 (ICD-10-CM) - Multiple sclerosis  R26.9 (ICD-10-CM) - Gait disturbance  R29.898 (ICD-10-CM) - Weakness of both lower extremities    Rationale for Evaluation and Treatment:  Rehabiliation  THERAPY DIAG:  Other abnormalities of gait and mobility  Muscle weakness (generalized)  ONSET DATE: chronic condition   SUBJECTIVE:  SUBJECTIVE STATEMENT: Here for worsening MS symptoms. She feels her left leg is weaker than the right and her balance is off. She has had several falls so she now uses cane but not really sure how to use it. She feels her legs are weak.   PERTINENT HISTORY:  See above PMH  PAIN:  NPRS scale: 7/10 upon arrival Pain location:mid back Pain description: constant Aggravating factors:  Relieving factors: rest, meds   PRECAUTIONS: ,  Fall  RED FLAGS: None   WEIGHT BEARING RESTRICTIONS:  No  FALLS:  Has patient fallen in last 6 months? Yes. Number of falls 4 and now uses a cane due to scared of falling   OCCUPATION:  no  PLOF:  Needs assistance with homemaking, bathing  PATIENT GOALS: improve balance, strengthen legs   OBJECTIVE:  Note: Objective measures were completed at Evaluation unless otherwise noted.  DIAGNOSTIC FINDINGS:  MRI of the brain 05/13/2016 showed multiple T2/FLAIR hyperintense foci in the pons and hemispheres in a pattern and configuration consistent with chronic demyelinating plaque associated with multiple sclerosis. None of the foci appears to be acute. When compared to the MRI dated 01/11/2015, there is no interval  change.    There is a normal enhancement pattern and there are no acute findings.   MRI of the cervical spine 05/13/2016 Foci within the posterior spinal cord adjacent to C3 and the left spinal cord adjacent to C5.   Neither of these appear to be acute.      Minimal disc degenerative changes at C5-C6 and C6-C7 that did not lead to any nerve root impingement.    There is a normal enhancement pattern and there are no acute findings.    PATIENT SURVEYS:  Patient-Specific Activity Scoring Scheme  0 represents "unable to perform." 10 represents "able to perform at prior level. 0 1 2 3 4 5 6 7 8 9  10 (Date and Score)   Activity Eval     1. walking 4    2      3.     4.    5.    Score 4/10    Total score = sum of the activity scores/number of activities Minimum detectable change (90%CI) for average score = 2 points Minimum detectable change (90%CI) for single activity score = 3 points   GAIT: Assistive device utilized: Single point cane Level of assistance: SBA Comments: She just acquired cane and was unsure how to use it, PT provided education and gait training for this.    UPPER EXTREMITY MMT:5/5 MMT grossly bilat    LOWER EXTREMITY MMT:    MMT Right eval Left eval  Hip flexion 3- 3  Hip extension    Hip abduction 3 3+  Hip adduction    Hip internal rotation    Hip external rotation    Knee flexion 3 4  Knee extension 4 4+  Ankle dorsiflexion    Ankle plantarflexion    Ankle inversion    Ankle eversion     (Blank rows = not tested)    FUNCTIONAL TESTS:  11/15/23 5 times sit to stand: 22 seconds with UE support   [facility] PT Assessment - 11/15/23 0001       Standardized Balance Assessment   Standardized Balance Assessment Berg Balance Test      Berg Balance Test   Sit to Stand Able to stand  independently using hands    Standing Unsupported Able to stand safely 2 minutes    Sitting  with Back Unsupported but Feet Supported on Floor or Stool Able to sit  safely and securely 2 minutes    Stand to Sit Controls descent by using hands    Transfers Able to transfer safely, definite need of hands    Standing Unsupported with Eyes Closed Able to stand 10 seconds with supervision    Standing Unsupported with Feet Together Able to place feet together independently but unable to hold for 30 seconds    From Standing, Reach Forward with Outstretched Arm Can reach forward >12 cm safely (5)    From Standing Position, Pick up Object from Floor Able to pick up shoe, needs supervision    From Standing Position, Turn to Look Behind Over each Shoulder Looks behind one side only/other side shows less weight shift    Turn 360 Degrees Able to turn 360 degrees safely but slowly    Standing Unsupported, Alternately Place Feet on Step/Stool Needs assistance to keep from falling or unable to try    Standing Unsupported, One Foot in Front Needs help to step but can hold 15 seconds    Standing on One Leg Unable to try or needs assist to prevent fall    Total Score 34                                                                                                                                     TREATMENT DATE:  Eval HEP creation and review with demonstration and trial set preformed, see below for details Gait training with [facility]    PATIENT EDUCATION: Education details: HEP, PT plan of care, selfcare Person educated: Patient Education method: Explanation, Demonstration, Verbal cues, and Handouts Education comprehension: verbalized understanding, further education recommended   HOME EXERCISE PROGRAM: Access Code: [email or web address] URL: [email or web address] Date: 11/15/2023 Prepared by: Redell Moose  Exercises - Seated Active Straight-Leg Raise  - 2 x daily - 6 x weekly - 3 sets - 5 reps - Standing March with Counter Support  - 2 x daily - 6 x weekly - 1-2 sets - 10 reps - Standing Tandem Balance with Counter Support  - 2 x daily - 6 x weekly - 1  sets - 4 reps - 30 sec hold - Narrow Stance with Head Nods and Counter Support  - 2 x daily - 6 x weekly - 1 sets - 10 reps - Narrow Stance with Head Rotations and Counter Support  - 2 x daily - 6 x weekly - 1 sets - 10 reps  ASSESSMENT:  CLINICAL IMPRESSION: 38 yo woman with MS and gait disturbance, poor balance and right leg weakness . Patient will benefit from skilled PT to improve overall function and to address impairments and limitations listed below.  OBJECTIVE IMPAIRMENTS: decreased activity tolerance for ADL's, difficulty walking, decreased balance, decreased endurance, decreased mobility, decreased ROM, decreased strength, impaired flexibility, impaired UE/LE use, and pain.  ACTIVITY LIMITATIONS: bending, liftting, walking,  standing, cleaning, community activity, driving, reaching, carry  PERSONAL FACTORS: see above PMH  also affecting patient's functional outcome.  REHAB POTENTIAL: Fair , MS comorbidity may hinder progress  CLINICAL DECISION MAKING: Evolving/moderate complexity  EVALUATION COMPLEXITY: Moderate    GOALS: Short term PT Goals Target date: 12/13/2023   Pt will be I and compliant with HEP. Baseline: no HEP until today Goal status: New   Long term PT goals Target date:01/10/2024  Pt will improve  strength to at least 4+/5 MMT to improve functional strength Baseline:3/5 Goal status: New Pt will improve Patient specific functional scale (PSFS) to at least 6/10 to show improved function level Baseline:4 Goal status: New Pt will reduce back pain to overall less than 4/10 with usual activity and work activity. Baseline:7 Goal status: New Pt will improve BERG balance test to 45/54 to show improved balance and decreased falls risk Baseline:34 Goal status: New   PLAN: PT FREQUENCY: 1-2 times per week   PT DURATION: 6-8 weeks  PLANNED INTERVENTIONS  97110-Therapeutic exercises, 97530- Therapeutic activity, [id number]- Neuromuscular re-education, 97535- Self  Care, [id number]- Manual therapy, [phone]- Gait training, and Patient/Family education  PLAN FOR NEXT SESSION:  NEXT MD VISIT: review HEP  Redell JONELLE Moose, PT,DPT 11/15/2023, 7:43 PM  For all possible CPT codes, reference the Planned Interventions line above.     Check all conditions that are expected to impact treatment: {Conditions expected to impact treatment:Morbid obesity, Diabetes mellitus, Musculoskeletal disorders, and Neurological condition and/or seizures   If treatment provided at initial evaluation, no treatment charged due to lack of authorization.

## 2023-11-15 ENCOUNTER — Encounter: Payer: Self-pay | Admitting: Physical Therapy

## 2023-11-15 ENCOUNTER — Ambulatory Visit: Attending: Neurology | Admitting: Physical Therapy

## 2023-11-15 DIAGNOSIS — G35 Multiple sclerosis: Secondary | ICD-10-CM | POA: Diagnosis not present

## 2023-11-15 DIAGNOSIS — R2689 Other abnormalities of gait and mobility: Secondary | ICD-10-CM | POA: Insufficient documentation

## 2023-11-15 DIAGNOSIS — R29898 Other symptoms and signs involving the musculoskeletal system: Secondary | ICD-10-CM | POA: Insufficient documentation

## 2023-11-15 DIAGNOSIS — R269 Unspecified abnormalities of gait and mobility: Secondary | ICD-10-CM | POA: Diagnosis not present

## 2023-11-15 DIAGNOSIS — M6281 Muscle weakness (generalized): Secondary | ICD-10-CM | POA: Insufficient documentation

## 2023-11-27 ENCOUNTER — Telehealth: Payer: Self-pay | Admitting: Physical Therapy

## 2023-11-27 ENCOUNTER — Ambulatory Visit: Attending: Neurology | Admitting: Physical Therapy

## 2023-11-27 NOTE — Telephone Encounter (Signed)
 No show for today's tx session. Called and spoke to pt, she thought her session was this afternoon. Reminded her of time/date of next appt, we can also add her to the wait list in case anything opens up sooner.  Josette Rough, PT, DPT 11/27/23 10:35 AM

## 2023-12-05 ENCOUNTER — Ambulatory Visit: Admitting: Physical Therapy

## 2023-12-14 ENCOUNTER — Other Ambulatory Visit: Payer: Self-pay | Admitting: Neurology

## 2023-12-25 ENCOUNTER — Encounter: Payer: Self-pay | Admitting: Radiology

## 2024-01-05 ENCOUNTER — Other Ambulatory Visit: Payer: Self-pay | Admitting: *Deleted

## 2024-01-05 MED ORDER — KESIMPTA 20 MG/0.4ML ~~LOC~~ SOAJ: 20.0000 mg | SUBCUTANEOUS | 5 refills | Status: AC

## 2024-02-20 ENCOUNTER — Ambulatory Visit: Admitting: Neurology

## 2024-02-23 ENCOUNTER — Other Ambulatory Visit: Payer: Self-pay | Admitting: Adult Health

## 2024-03-05 ENCOUNTER — Other Ambulatory Visit: Payer: Self-pay

## 2024-03-05 MED ORDER — CYCLOBENZAPRINE HCL 10 MG PO TABS: 10.0000 mg | ORAL_TABLET | Freq: Two times a day (BID) | ORAL | 0 refills | Status: AC | PRN

## 2024-05-27 ENCOUNTER — Ambulatory Visit: Admitting: Family Medicine
